# Patient Record
Sex: Male | Born: 1935 | Race: White | Hispanic: No | Smoking: Former smoker
Health system: Southern US, Community
[De-identification: ages and names within clinical notes are randomized; demographics above are authoritative.]

## PROBLEM LIST (undated history)

## (undated) DIAGNOSIS — I2510 Atherosclerotic heart disease of native coronary artery without angina pectoris: Secondary | ICD-10-CM

## (undated) DIAGNOSIS — I499 Cardiac arrhythmia, unspecified: Secondary | ICD-10-CM

## (undated) DIAGNOSIS — I480 Paroxysmal atrial fibrillation: Secondary | ICD-10-CM

## (undated) DIAGNOSIS — A0472 Enterocolitis due to Clostridium difficile, not specified as recurrent: Secondary | ICD-10-CM

## (undated) DIAGNOSIS — I219 Acute myocardial infarction, unspecified: Secondary | ICD-10-CM

## (undated) DIAGNOSIS — J189 Pneumonia, unspecified organism: Secondary | ICD-10-CM

## (undated) DIAGNOSIS — I509 Heart failure, unspecified: Secondary | ICD-10-CM

## (undated) DIAGNOSIS — Z66 Do not resuscitate: Secondary | ICD-10-CM

## (undated) DIAGNOSIS — N189 Chronic kidney disease, unspecified: Secondary | ICD-10-CM

## (undated) DIAGNOSIS — R0602 Shortness of breath: Secondary | ICD-10-CM

## (undated) DIAGNOSIS — I10 Essential (primary) hypertension: Secondary | ICD-10-CM

## (undated) DIAGNOSIS — E785 Hyperlipidemia, unspecified: Secondary | ICD-10-CM

## (undated) HISTORY — PX: CORONARY ARTERY BYPASS GRAFT: SHX141

## (undated) HISTORY — DX: Paroxysmal atrial fibrillation: I48.0

## (undated) HISTORY — PX: CHOLECYSTECTOMY: SHX55

## (undated) HISTORY — DX: Pneumonia, unspecified organism: J18.9

## (undated) HISTORY — PX: OTHER SURGICAL HISTORY: SHX169

---

## 1999-01-09 ENCOUNTER — Emergency Department (HOSPITAL_COMMUNITY): Admission: EM | Admit: 1999-01-09 | Discharge: 1999-01-09 | Payer: Self-pay | Admitting: Emergency Medicine

## 1999-01-09 ENCOUNTER — Encounter: Payer: Self-pay | Admitting: Emergency Medicine

## 1999-02-22 ENCOUNTER — Ambulatory Visit (HOSPITAL_COMMUNITY): Admission: RE | Admit: 1999-02-22 | Discharge: 1999-02-22 | Payer: Self-pay | Admitting: *Deleted

## 1999-08-15 ENCOUNTER — Encounter: Payer: Self-pay | Admitting: Cardiology

## 1999-08-15 ENCOUNTER — Encounter: Payer: Self-pay | Admitting: Emergency Medicine

## 1999-08-15 ENCOUNTER — Inpatient Hospital Stay (HOSPITAL_COMMUNITY): Admission: EM | Admit: 1999-08-15 | Discharge: 1999-08-19 | Payer: Self-pay | Admitting: Emergency Medicine

## 1999-08-17 ENCOUNTER — Encounter: Payer: Self-pay | Admitting: Cardiology

## 2001-10-10 ENCOUNTER — Ambulatory Visit (HOSPITAL_COMMUNITY): Admission: RE | Admit: 2001-10-10 | Discharge: 2001-10-11 | Payer: Self-pay | Admitting: Cardiology

## 2001-10-10 HISTORY — PX: CARDIAC CATHETERIZATION: SHX172

## 2003-05-21 ENCOUNTER — Encounter (INDEPENDENT_AMBULATORY_CARE_PROVIDER_SITE_OTHER): Payer: Self-pay | Admitting: Specialist

## 2003-05-21 ENCOUNTER — Observation Stay (HOSPITAL_COMMUNITY): Admission: RE | Admit: 2003-05-21 | Discharge: 2003-05-22 | Payer: Self-pay | Admitting: Urology

## 2005-04-19 ENCOUNTER — Ambulatory Visit (HOSPITAL_COMMUNITY): Admission: RE | Admit: 2005-04-19 | Discharge: 2005-04-20 | Payer: Self-pay | Admitting: Urology

## 2005-04-19 ENCOUNTER — Encounter (INDEPENDENT_AMBULATORY_CARE_PROVIDER_SITE_OTHER): Payer: Self-pay | Admitting: Specialist

## 2005-04-22 ENCOUNTER — Observation Stay (HOSPITAL_COMMUNITY): Admission: EM | Admit: 2005-04-22 | Discharge: 2005-04-28 | Payer: Self-pay | Admitting: Emergency Medicine

## 2006-02-19 ENCOUNTER — Inpatient Hospital Stay (HOSPITAL_COMMUNITY): Admission: RE | Admit: 2006-02-19 | Discharge: 2006-02-23 | Payer: Self-pay | Admitting: Urology

## 2006-02-19 ENCOUNTER — Encounter (INDEPENDENT_AMBULATORY_CARE_PROVIDER_SITE_OTHER): Payer: Self-pay | Admitting: Specialist

## 2006-02-19 ENCOUNTER — Encounter (INDEPENDENT_AMBULATORY_CARE_PROVIDER_SITE_OTHER): Payer: Self-pay | Admitting: *Deleted

## 2006-05-09 ENCOUNTER — Ambulatory Visit (HOSPITAL_COMMUNITY): Admission: RE | Admit: 2006-05-09 | Discharge: 2006-05-10 | Payer: Self-pay | Admitting: Urology

## 2006-05-09 ENCOUNTER — Encounter (INDEPENDENT_AMBULATORY_CARE_PROVIDER_SITE_OTHER): Payer: Self-pay | Admitting: Specialist

## 2007-06-26 ENCOUNTER — Inpatient Hospital Stay (HOSPITAL_COMMUNITY): Admission: EM | Admit: 2007-06-26 | Discharge: 2007-06-29 | Payer: Self-pay | Admitting: Emergency Medicine

## 2008-10-31 ENCOUNTER — Inpatient Hospital Stay (HOSPITAL_COMMUNITY): Admission: EM | Admit: 2008-10-31 | Discharge: 2008-11-01 | Payer: Self-pay | Admitting: Emergency Medicine

## 2010-09-20 LAB — POCT CARDIAC MARKERS
CKMB, poc: <1 ng/mL — ABNORMAL LOW (ref 1.0–8.0)
CKMB, poc: <1 ng/mL — ABNORMAL LOW (ref 1.0–8.0)
Myoglobin, poc: 146 ng/mL (ref 12–200)
Myoglobin, poc: 83.8 ng/mL (ref 12–200)
Troponin i, poc: <0.05 ng/mL (ref 0.00–0.09)
Troponin i, poc: <0.05 ng/mL (ref 0.00–0.09)

## 2010-09-20 LAB — URINALYSIS, ROUTINE W REFLEX MICROSCOPIC
Bilirubin Urine: NEGATIVE
Glucose, UA: NEGATIVE mg/dL
Ketones, ur: NEGATIVE mg/dL
Nitrite: NEGATIVE
Protein, ur: 100 mg/dL — AB
Specific Gravity, Urine: 1.014 (ref 1.005–1.030)
Urobilinogen, UA: 0.2 mg/dL (ref 0.0–1.0)
pH: 6 (ref 5.0–8.0)

## 2010-09-20 LAB — BASIC METABOLIC PANEL
CO2: 27 mEq/L (ref 19–32)
Calcium: 9.1 mg/dL (ref 8.4–10.5)
Creatinine, Ser: 1.3 mg/dL (ref 0.4–1.5)
GFR calc Af Amer: >60 mL/min (ref >60)

## 2010-09-20 LAB — URINE MICROSCOPIC-ADD ON

## 2010-09-20 LAB — DIFFERENTIAL
Basophils Absolute: 0 10*3/uL (ref 0.0–0.1)
Basophils Relative: 0 % (ref 0–1)
Basophils Relative: 0 % (ref 0–1)
Eosinophils Absolute: 0 10*3/uL (ref 0.0–0.7)
Eosinophils Absolute: 0 10*3/uL (ref 0.0–0.7)
Eosinophils Relative: 0 % (ref 0–5)
Eosinophils Relative: 0 % (ref 0–5)
Lymphocytes Relative: 5 % — ABNORMAL LOW (ref 12–46)
Lymphs Abs: 0.7 10*3/uL (ref 0.7–4.0)
Monocytes Absolute: 0.7 10*3/uL (ref 0.1–1.0)
Monocytes Absolute: 1.9 10*3/uL — ABNORMAL HIGH (ref 0.1–1.0)
Monocytes Relative: 13 % — ABNORMAL HIGH (ref 3–12)
Monocytes Relative: 6 % (ref 3–12)
Neutro Abs: 10.7 10*3/uL — ABNORMAL HIGH (ref 1.7–7.7)
Neutrophils Relative %: 88 % — ABNORMAL HIGH (ref 43–77)

## 2010-09-20 LAB — CULTURE, BLOOD (ROUTINE X 2)
Culture: NO GROWTH
Culture: NO GROWTH

## 2010-09-20 LAB — GLUCOSE, CAPILLARY
Glucose-Capillary: 116 mg/dL — ABNORMAL HIGH (ref 70–99)
Glucose-Capillary: 197 mg/dL — ABNORMAL HIGH (ref 70–99)
Glucose-Capillary: 201 mg/dL — ABNORMAL HIGH (ref 70–99)
Glucose-Capillary: 246 mg/dL — ABNORMAL HIGH (ref 70–99)

## 2010-09-20 LAB — APTT: aPTT: 46 seconds — ABNORMAL HIGH (ref 24–37)

## 2010-09-20 LAB — POCT I-STAT, CHEM 8
BUN: 21 mg/dL (ref 6–23)
Calcium, Ion: 1.2 mmol/L (ref 1.12–1.32)
Chloride: 106 mEq/L (ref 96–112)
Creatinine, Ser: 1.1 mg/dL (ref 0.4–1.5)
Glucose, Bld: 190 mg/dL — ABNORMAL HIGH (ref 70–99)
HCT: 41 % (ref 39.0–52.0)
Hemoglobin: 13.9 g/dL (ref 13.0–17.0)
Potassium: 4.5 mEq/L (ref 3.5–5.1)
Sodium: 139 mEq/L (ref 135–145)
TCO2: 24 mmol/L (ref 0–100)

## 2010-09-20 LAB — CK TOTAL AND CKMB (NOT AT ARMC)
CK, MB: 0.7 ng/mL (ref 0.3–4.0)
Relative Index: INVALID (ref 0.0–2.5)
Total CK: 30 U/L (ref 7–232)

## 2010-09-20 LAB — CBC
HCT: 38.8 % — ABNORMAL LOW (ref 39.0–52.0)
Hemoglobin: 12.1 g/dL — ABNORMAL LOW (ref 13.0–17.0)
Hemoglobin: 13.5 g/dL (ref 13.0–17.0)
MCHC: 34.9 g/dL (ref 30.0–36.0)
MCHC: 35.3 g/dL (ref 30.0–36.0)
MCV: 96.7 fL (ref 78.0–100.0)
MCV: 96.8 fL (ref 78.0–100.0)
Platelets: 101 10*3/uL — ABNORMAL LOW (ref 150–400)
RBC: 3.55 MIL/uL — ABNORMAL LOW (ref 4.22–5.81)
RBC: 4.01 MIL/uL — ABNORMAL LOW (ref 4.22–5.81)
RDW: 13.4 % (ref 11.5–15.5)
WBC: 12.2 10*3/uL — ABNORMAL HIGH (ref 4.0–10.5)

## 2010-09-20 LAB — URINE CULTURE: Colony Count: >=100000

## 2010-09-20 LAB — BRAIN NATRIURETIC PEPTIDE: Pro B Natriuretic peptide (BNP): 194 pg/mL — ABNORMAL HIGH (ref 0.0–100.0)

## 2010-09-20 LAB — CARDIAC PANEL(CRET KIN+CKTOT+MB+TROPI)
Relative Index: INVALID (ref 0.0–2.5)
Relative Index: INVALID (ref 0.0–2.5)
Troponin I: 0.02 ng/mL (ref 0.00–0.06)

## 2010-09-20 LAB — PROTIME-INR
INR: 2.3 — ABNORMAL HIGH (ref 0.00–1.49)
Prothrombin Time: 27.1 seconds — ABNORMAL HIGH (ref 11.6–15.2)

## 2010-09-20 LAB — TROPONIN I: Troponin I: 0.03 ng/mL (ref 0.00–0.06)

## 2010-10-25 NOTE — H&P (Signed)
NAMEILLIAS, PANTANO NO.:  [id number]   MEDICAL RECORD NO.:  [id number]          PATIENT TYPE:  INP   LOCATION:  1833                         FACILITY:  [facility]   PHYSICIAN:  Della Goo, M.D. DATE OF BIRTH:  1910-12-11   DATE OF ADMISSION:  10/31/2008  DATE OF DISCHARGE:                              HISTORY & PHYSICAL   PRIMARY CARE PHYSICIAN:  Vernie Ammons, M.D.   CHIEF COMPLAINT:  Fevers, chills.   HISTORY OF PRESENT ILLNESS:  This is a 77 year old male who was brought  to the emergency department secondary to sudden onset of fevers, chills  today along with shortness of breath.  The patient was found on arrival  have a decreased O2 saturation of 80%.  The patient also has a  remarkable history that this past week he underwent repeat cystoscopy  performed secondary to finding of bladder tumors.  This was performed by  Dr. Laverle Patter, Urology.  The patient was evaluated in the emergency  department found to have CHF on chest x-ray; also found to have a  positive urinalysis and placed on IV antibiotic therapy year of Rocephin  and also administered Lasix IV times 1 dose.  The patient was referred  for admission.   The patient denies having any nausea, vomiting.  He does report having  decreased p.o. intake during the past 24 hours.  He denies having any  syncope, seizures.   PAST MEDICAL HISTORY:  1. Coronary artery disease, status post coronary artery bypass      grafting times in 1983 and then again in 1993.  2. Hyperlipidemia.  3. Hypertension.  4. He also status post cholecystectomy.  5. History of type 2 diabetes mellitus.   MEDICATIONS:  1. Glyburide 2.5 mg p.o. b.i.d.  2. Gemfibrozil 600 mg p.o. b.i.d.  3. Folic acid for 400 mcg one p.o. daily.  4. Coumadin 5 mg one p.o. daily.  5. Bisoprolol 5 mg p.o. b.i.d.  6. Zetia 10 mg one p.o. daily.  7. Lanoxin 0.25 mg one p.o. daily.   ALLERGIES:  MORPHINE which causes hallucinations.   SOCIAL  HISTORY:  Patient is a nonsmoker, nondrinker.   FAMILY HISTORY:  Noncontributory secondary to the patient's age.   REVIEW OF SYSTEMS:  Pertinents are mentioned above.  This patient denies  having any nausea, vomiting, diarrhea or constipation.   PHYSICAL EXAMINATION:  FINDINGS:  This is a pleasant 77 year old  elderly, well-nourished, well-developed gentleman in mild discomfort but  no acute distress.  VITAL SIGNS: Temperature 99.8, blood pressure 132/50, heart rate 72,  respirations 18, O2 saturations 97%-199%.  HEENT EXAMINATION:  Normocephalic, atraumatic.  Pupils equally round,  reactive to light.  Extraocular movements are intact.  Funduscopic  benign.  Nares are patent bilaterally.  Oropharynx is clear.  NECK:  Supple, full range of motion.  No thyromegaly, adenopathy,  jugular venous distention.  CARDIOVASCULAR:  Regular rate and rhythm.  LUNGS:  Clear to auscultation bilaterally.  ABDOMEN: Positive bowel sounds, soft, nontender, nondistended.  EXTREMITIES: Without cyanosis, clubbing or edema.  NEUROLOGIC EXAMINATION:  The patient is alert and oriented x3.  Motor  and sensory function are intact.  There are no gross motor or sensory  deficits.   LABORATORY STUDIES:  White blood cell count 12.2, hemoglobin 13.5,  hematocrit 38.8, MCV 96.8, platelets 101, neutrophils 88%, lymphocytes  5%.  Pro time 27.1, INR 2.3, PTT 46.  Sodium 139, potassium 4.5,  chloride 106, BUN 21, creatinine 1.1, glucose 190 and CO2 24.  Point of  care cardiac markers with a myoglobin of 83.8, CK-MB less than 1.0,  troponin less than 0.05.  Urinalysis reveals moderate leukocyte  esterase, total protein 100 mcg/dL and large urine hemoglobin.  Beta  natriuretic peptide 194.0.  Chest x-ray reveals cardiomegaly with early  CHF.   ASSESSMENT:  A 77 year old male being admitted with:  1. Febrile illness.  2. Urinary tract infection.  3. Acute left-sided congestive heart failure.  4. Shortness of breath.   5. Type 2 diabetes mellitus.  6. History of coronary artery disease.   PLAN:  The patient will be admitted to telemetry area.  Cardiac enzymes  will be performed.  The patient will be placed on IV antibiotic therapy  of Rocephin and supplemental oxygen has also been ordered.  The  patient's regular medications will be further verified and continued.  His PT and INR will also be monitored daily.  The patient will be placed  on GI prophylaxis as well.  Further workup will ensue pending results of  the patient's clinical course.      Della Goo, M.D.  Electronically Signed     HJ/MEDQ  D:  10/31/2008  T:  11/01/2008  Job:  [id number]   cc:   Vernie Ammons

## 2010-10-25 NOTE — Discharge Summary (Signed)
[name], David Davidson              ACCOUNT NO.:  [id number]   MEDICAL RECORD NO.:  [id number]          PATIENT TYPE:  INP   LOCATION:  4729                         FACILITY:  [facility]   PHYSICIAN:  Charlestine Massed, MDDATE OF BIRTH:  06-29-1910   DATE OF ADMISSION:  10/31/2008  DATE OF DISCHARGE:  11/01/2008                               DISCHARGE SUMMARY   PRIMARY CARE PHYSICIAN:  Lorelle Formosa, M.D.   REASON FOR ADMISSION:  Fever and chills.   DISCHARGE DIAGNOSES:  1. Urinary tract infection.  2. Pyelonephritis has been ruled out and no evidence of      hydronephrosis.  3. Coronary artery disease, currently stable.  4. Dyslipidemia, currently stable.  5. Hypertension, currently stable.  6. History of diabetes mellitus type 2, currently sugars are stable.   DISCHARGE MEDICATIONS:  1. Ceftin 250 mg p.o. b.i.d. for 10 days.  2. Glyburide 2.5 mg p.o. b.i.d.  3. Gemfibrozil 600 mg p.o. b.i.d.  4. Folic acid 400 mcg p.o. daily.  5. Coumadin 5 mg p.o. daily.  6. Bisoprolol 5 mg p.o. b.i.d.  7. Zetia 10 mg p.o. daily.  8. Lanoxin 0.25 mg p.o. daily.   HOSPITAL COURSE BY PROBLEM:  1. Urinary tract infection with fever, chills, and sepsis.  The      patient was admitted yesterday on Oct 31, 2008, with fever, chills      and some dysuria.  The patient said that he went to see Dr.      Ronne Binning, but he was not in his office and so he came to the      emergency room as he started getting fever.  The patient was      evaluated in the ER.  His urine was found to have a significant      pyuria and bacteriuria.  Urine culture was done which is still      pending.  The patient had a temperature of 101.  He was started on      ceftriaxone after which the fever subsided considerably.  He also      clinically improved a lot.  The urine culture is still pending but      in view of the fact that the patient has clinically improved, we      will discharge him on his home medications.  If  there is any      further change in this, we will request Dr. Ronne Binning to follow      these results of culture if there is any further culture changes      present.  So far the culture reports have not come back as it is      only less than 24 hours since admission, so we will follow up these      results and inform Dr. Ronne Binning about any further changes at that      time.  2. Coronary artery disease, currently stable.  Continue the existing      medications.  3. Dyslipidemia, currently on gemfibrozil and Zetia 10 mg daily.  4. Diabetes mellitus.  Continue glyburide.  Sugar was stable at this      time.   TESTS DONE DURING THIS ADMISSION:  Ultrasound of the kidneys, bladder,  and pelvis, did not reveal any evidence of hydronephrosis in both  kidneys.  Simple cysts found on the left kidney, bladder does not have  any mass and his prostate gland is slightly enlarged.  No other acute  abnormalities noted.   DISPOSITION:  Discharge back home.   FOLLOW UP:  Follow up with Dr. Ronne Binning in another 3-4 days for blood  check up.   DISCHARGE INSTRUCTIONS:  The patient has been clearly instructed that if  he develops fever again, then to report to the emergency room for  further check up.    A total of 40 minutes spent on the discharge.      Charlestine Massed, MD  Electronically Signed     UT/MEDQ  D:  11/01/2008  T:  11/02/2008  Job:  [id number]   cc:   Lorelle Formosa, M.D.

## 2010-10-25 NOTE — H&P (Signed)
NAMESKIP, LITKE NO.:  [id number]   MEDICAL RECORD NO.:  [id number]          PATIENT TYPE:  INP   LOCATION:  1829                         FACILITY:  [facility]   PHYSICIAN:  Lonia Blood, M.D.DATE OF BIRTH:  [birth year]   DATE OF ADMISSION:  06/26/2007  DATE OF DISCHARGE:                              HISTORY & PHYSICAL   PRIMARY CARE PHYSICIAN:  Dr. Jaclyn Prime. [place].   CHIEF COMPLAINT:  Shortness of breath.   HISTORY OF PRESENT ILLNESS:  [name] is a very pleasant,  independently-functioning 77 year old gentleman who reports a one-week  history of progressive shortness of breath.  This is most appreciable  when he exerts himself physically.  He has noted increasing lower  extremity edema over this time period as well.  He also endorses some  orthopnea, reporting that he is now sleeping on two to three pillows.  There has been no chest pain.  There have been no fevers or chills.  There has been no cough.  There has been no nausea, vomiting or  abdominal pain.  His p.o. intake has been regular.  There have not been  any recent changes in his medical regimen.   REVIEW OF SYSTEMS:  A comprehensive review of systems is unremarkable,  with the exception of the positive elements noted in the history of  present illness.   PAST MEDICAL HISTORY:  1. Bladder cancer, status post transurethral resection.  2. Atrial fibrillation.  3. Coronary artery disease, status post coronary artery bypass graft      surgery x2 in 1981.  4. Systolic congestive heart failure with reported recent      echocardiogram within the last two weeks at Dr. Pollie Friar office.  5. Hypertension.  6. Hyperlipidemia.  7. Diabetes mellitus.  8. Macular degeneration.  9. No-code-blue/DNR.   OUTPATIENT MEDICATIONS:  1. Bisoprolol 5 mg daily.  2. Coumadin 5 mg daily.  3. Finasteride daily.  4. Flomax 0.4 mg daily.  5. Folic acid 1 mg daily.  6. Gemfibrozil 600 mg daily.  7.  Glyburide 2.5 mg daily.  8. Digoxin 0.125 mg p.o. daily.  9. Zetia 10 mg daily.   ALLERGIES:  MORPHINE.   FAMILY HISTORY:  Noncontributory secondary to age.   SOCIAL HISTORY:  The patient lives alone.  His wife has passed away.  He  is retired.  He does not drink alcohol or smoke.   DATA:  Beta natriuretic peptide 301.  Digoxin less than 0.2.  Point of  care cardiac markers negative x1.   A 12-lead electrocardiogram reveals normal sinus rhythm at 73 beats per  minute without acute ST or T-wave changes.   CBC reveals a hemoglobin 11.6, MCV 98, white count and platelet count  normal.  Basic metabolic panel reveals a potassium 5.2 and a recheck  potassium of 4.6.  Renal function is otherwise normal.  Serum glucose  174.   A chest x-ray reveals significant bilateral pulmonary edema.   PHYSICAL EXAMINATION:  VITAL SIGNS:  Temperature 98.4 degrees, blood  pressure 154/72, heart rate 72, respirations 20, O2 saturation 95% on 3  liters per minute nasal cannula.  GENERAL:  A well-developed and well-nourished gentleman, in no acute  respiratory distress.  HEENT:  Normocephalic and atraumatic.  Pupils equal, round, react to  light and accommodation.  Extraocular muscles intact.  EOCs and  oropharynx clear.  NECK:  Jugular venous distention to the angle the jaw at 30 degrees.  LUNGS:  Bibasilar crackles halfway up both [place] with clear movement  throughout all other [place], with no active wheeze.  CARDIOVASCULAR:  Regular rate and rhythm at the present time without  murmur, gallop or rub.  ABDOMEN:  Nontender, nondistended, soft, bowel sounds present.  No  hepatosplenomegaly.  No rebound, no ascites.  EXTREMITIES:  With 2+ doughy bilateral lower extremity edema to halfway  up the thighs with no significant erythema or discharge.  NEUROLOGIC:  Alert and oriented x4.  Cranial nerves II-XII  intact  bilaterally, with 5/5 strength in the bilateral upper and lower  extremities.  Intact  sensation to touch throughout.   IMPRESSION/PLAN:  1. Acute congestive heart failure exacerbation:  The patient is      clearly suffering with an acute congestive heart failure      exacerbation with volume overload.  We will diurese him      empirically.  He reportedly had an echocardiogram within the last      two weeks at Dr. Pollie Friar office.  Prior to ordering a new      echocardiogram, we will request the results of this be sent to the      patient's chart.  We will increase his digoxin, as his level is      noted to be subtherapeutic, and he reports that he is taking his      medications as scheduled.  We will rule the patient out for an      acute myocardial infarction empirically, but the patient does not      present with symptoms to suggest this, and therefore I do not feel      that a nitroglycerin drip or other medications appropriate for      acute coronary syndrome are indicated at this time.  2. Atrial fibrillation:  The patient has a longstanding history of      atrial fibrillation.  At the present time, however, he is in normal      sinus rhythm.  This makes it much less likely that the patient's      volume overload is due to uncontrolled ventricular response rate.      We will follow him on telemetry.  We will ask the pharmacy to      continue his Coumadin dosing.  3. Hypertension:  The patient's blood pressure is suboptimally      controlled at the present time.  There is likely a component of      acute stress with the patient's pending hospitalization.  I will      continue his at-home p.o. medications and we will follow him during      his hospital stay.  4. Diabetes mellitus:  We will place the patient on sliding scale      insulin.  We will administered his home Glucotrol dose.  We will      follow his blood sugars very closely.  5. No-code-blue:  I had a discussion with the patient directly.  He      reports that he does not wish to undergo mechanical  ventilation,      intubation or  CPR.  We will therefore honor his wishes and declare      him a no-code-blue during his hospital stay.      Lonia Blood, M.D.  Electronically Signed     JTM/MEDQ  D:  06/26/2007  T:  06/26/2007  Job:  [id number]   cc:   Jaclyn Prime. Lucas Mallow, M.D.

## 2010-10-25 NOTE — Discharge Summary (Signed)
David Davidson, David Davidson              ACCOUNT NO.:  [id number]   MEDICAL RECORD NO.:  [id number]          PATIENT TYPE:  INP   LOCATION:  4715                         FACILITY:  [facility]   PHYSICIAN:  Wilson Singer, M.D.DATE OF BIRTH:  1911/04/03   DATE OF ADMISSION:  06/26/2007  DATE OF DISCHARGE:  06/29/2007                               DISCHARGE SUMMARY   FINAL DISCHARGE DIAGNOSES:  1. Congestive heart failure with frank pulmonary edema.  2. Atrial fibrillation.  3. Hypertension.  4. Diabetes type 2.  5. Hyperlipidemia.   CONDITION ON DISCHARGE:  Stable.   MEDICATIONS ON DISCHARGE:  1. Bisoprolol 5 mg daily.  2. Coumadin 5 mg daily.  3. Finasteride daily.  4. Flomax 0.4 mg daily.  5. Folic acid 1 mg daily.  6. Gemfibrozil 600 mg daily.  7. Glyburide increased to 2.5 mg b.i.d.  8. Digoxin 0.125 mg daily.  9. Zetia 10 mg daily.  10.Lasix 20 mg b.i.d.   HISTORY:  This is a very pleasant independently functioning 77 year old  man who came in with pulmonary edema.  Please see initial history and  physical examination by Dr. Jetty Duhamel.   HOSPITAL COURSE:  The patient was admitted, and he was diuresed  initially with intravenous Lasix.  This improved his pulmonary edema  very rapidly and a chest x-ray improved correspondingly.  His sugars  were elevated as he was on the Lasix, and his glyburide has had to be  increased to twice a day.  He remained well anticoagulated for his  atrial fibrillation.  He did have some bradycardia which was managed by  withholding his digoxin, and now it has been restarted prior to  discharge.  An echocardiogram report was obtained from Dr. Pollie Friar  office which showed an ejection fraction equal to or greater than 50%  with mild aortic stenosis and decreased left ventricular contractility.  I suspect he has diastolic dysfunction mostly that is causing his  pulmonary edema.  Serial cardiac enzymes ruled out any ischemia or  myocardial  infarction.   DISPOSITION:  Today, he looks well and feels well.  He is not short of  breath.   On physical examination, temperature 90.7, blood pressure 113/62, pulse  90 in atrial fibrillation, saturation 94% on 2 liters oxygen.  Heart sounds are present in atrial fibrillation  Lung Gleed are entirely clear.   Investigations today show BNP of 83.  Digoxin level 0.5 which is  reduced.  Sodium 139, potassium 3.8, bicarbonate 24, BUN 35, creatinine  0.94, INR 2.8.   Further disposition.  We reinstituted his digoxin today, and I believe  his ventricular rate should come down correspondingly.  His Lasix will  be maintained at 20 mg twice a day until further evaluated by Dr. Lucas Mallow.  I do not think any further cardiologic intervention is needed at this  point, except to treat him medically.  His oral hypoglycemic agent has  been increased to twice a day which I am not surprised with Lasix on  board.      Wilson Singer, M.D.  Electronically Signed  NCG/MEDQ  D:  06/29/2007  T:  06/29/2007  Job:  [id number]

## 2010-10-28 NOTE — Op Note (Signed)
David Davidson, David Davidson NO.:  [id number]   MEDICAL RECORD NO.:  [id number]          PATIENT TYPE:  OIB   LOCATION:  1431                         FACILITY:  [facility]   PHYSICIAN:  Heloise Purpura, MD      DATE OF BIRTH:  22-Nov-1910   DATE OF PROCEDURE:  05/09/2006  DATE OF DISCHARGE:                               OPERATIVE REPORT   PREOPERATIVE DIAGNOSIS:  Bladder cancer.   POSTOPERATIVE DIAGNOSIS:  Bladder cancer.   PROCEDURES:  1. Cystoscopy.  2. Examination under anesthesia.  3. Transurethral resection of bladder tumor (between 2 and 5 cm).   SURGEON:  Dr. Heloise Purpura.   ANESTHESIA:  General.   COMPLICATIONS:  None.   INDICATION:  Mr. Wingrove is a 77 year old gentleman with a history of  superficial bladder cancer.  He has a history of a high-grade TA bladder  cancer.  Most recently, he underwent a resection of the extremely large  bladder tumor at the dome of his bladder including the resection of the  smaller tumors on both the left and right side of the bladder.  He is  now presenting for a restaging evaluation for resection of any residual  disease.  Potential risks and benefits were discussed with the patient  and he consented.   DESCRIPTION OF PROCEDURE:  The patient was taken to the operating room  and general anesthetic was administered.  He was given preoperative  antibiotics, placed in the dorsal lithotomy position, prepped and draped  in the usual sterile fashion.  Next preoperative time-out was performed.  Cystourethroscopy was then performed with the 30 and 70 degrees lens.  The bladder was emptied and an exam under anesthesia was performed and  was negative.  The cytoscope was reinserted and a complete survey of the  bladder revealed some residual tumor at the dome of the bladder at the  site of the patient's previous large tumor site.  In addition, multiple  small tumors were noted anteriorly, laterally on the right and left side  of the  bladder.  An examination under anesthesia was performed with the  patient's bladder emptied.  There is no evidence of any three-  dimensional mass.  The cystoscope was then removed and replaced with the  26-French resectoscope sheath.  Using loop resection, all visible  bladder tumors were removed.  Specimens were taken specifically from the  bladder dome, base of the bladder dome lesion, and the right trigone.  Additional abnormal areas were fulgurated.  Hemostasis was obtained at  all sites with fulguration.  The patient's bladder was then emptied and  reinspected.  Hemostasis appeared excellent.  At this point the  resectoscope was removed.  A 22-French Foley catheter was inserted into  the bladder and the bladder was drained.  40 mg of  mitomycin C in 20 mL of sterile water was then inserted into the bladder  and the catheter was plugged for 1 hour.  He was able to be awakened and  transferred to recovery unit in satisfactory condition.  There were no  complications.  The patient appeared to tolerate the procedure  well.           ______________________________  Heloise Purpura, MD  Electronically Signed     LB/MEDQ  D:  05/09/2006  T:  05/09/2006  Job:  [id number]

## 2010-10-28 NOTE — Op Note (Signed)
NAME:  David Davidson, David Davidson                        ACCOUNT NO.:  [id number]   MEDICAL RECORD NO.:  [id number]                   PATIENT TYPE:  OBV   LOCATION:  0371                                 FACILITY:  [facility]   PHYSICIAN:  Claudette Laws, M.D.               DATE OF BIRTH:  1911-05-10   DATE OF PROCEDURE:  05/21/2003  DATE OF DISCHARGE:                                 OPERATIVE REPORT   PREOPERATIVE DIAGNOSIS:  Bladder tumor.   POSTOPERATIVE DIAGNOSIS:  Bladder tumor.   OPERATION/PROCEDURE:  1. Cystoscopy.  2. Transurethral resection of bladder tumor, large, with total surface area     of approximately 8 cm.  3. Examination under anesthesia.  4. Mitomycin-C instillation.   SURGEON:  Claudette Laws, M.D.   ASSISTANT:  Susanne Borders, M.D.   ANESTHESIA:  Laryngeal mask airway.   PATHOLOGY:  Bladder tumors for pathology analysis.   ESTIMATED BLOOD LOSS:  Approximately 150 ml.   COMPLICATIONS:  None.   DISPOSITION:  To the post anesthesia care unit in stable condition.   DRAINS:  24-French, three-way Foley catheter which is currently plugged due  to mitomycin-C instillation but will be connected to continuous bladder  irrigation in one hour.   INDICATIONS FOR PROCEDURE:  [name] is a 77 year old male who was  recently evaluated by Dr. Etta Grandchild for gross hematuria.  Flexible cystoscopy in  the office demonstrated a large bladder tumor on the right lateral wall.  The patient was also evaluated with CT scan with and without contrast which  demonstrated normal upper tracts and no obvious invasion of the bladder  tumor through the wall of the bladder.  It was recommended that the patient  undergo transurethral resection of bladder tumor.  He consented to this  after understanding the risks, benefits and alternatives.   DESCRIPTION OF PROCEDURE:  The patient was brought to the operating room and  correctly identified by his identification bracelet.  He was given  preoperative  antibiotics and general endotracheal anesthesia.  He was placed  in the dorsal lithotomy position.  He was prepped and draped in the typical  sterile fashion.   Cystoscopy was carried out with 22-French cystoscopy and 12-degree lens.  The patient had a minimal lateral lobe hypertrophy but a nice open bladder  neck within his prostatic urethra.  Bilateral ureteral orifices were  identified in a normal anatomic location, each effluxing clear urine.  On  the right lateral wall of the bladder extending from the approximately  halfway up the right lateral wall to the level of the right ureteral orifice  was a large bladder tumor which appeared somewhat sessile.  Systematic look  at the remainder of the bladder revealed no further mucosal abnormalities.  There were several jag stones on the floor of the patient's bladder.  There  were also a few blood clots.  The cystoscope was removed.  A 28-French  resectoscope  was replaced with the obturator.  The obturator was removed and  the resectoscope was inserted.  Using cutting current, the large right  bladder tumor was serially resected, starting laterally and extending  medially.  There were some small papillary lesions near the right ureteral  orifice but this area was not resected.  These lesions were carefully  coagulated near the right ureteral orifice.  Just lateral to the ureteral  orifice, cutting current was used and a thick muscular bite was resected.  After the entire tumor was resected, the floor of the tumor was carefully  coagulated using the coagulation current of the resectoscope.  There did not  appear to be any further bladder lesion.  The total surface area of the  resection was approximately 8 cm.  Because of the depth of the resection  near the right ureteral orifice, an ampule of indigo carmine was given and  there was indeed efflux of blue urine from both ureteral orifices.  A tube  syringe was used to irrigate out all the  bladder chips and re inspection  revealed no active bleeding.  A 24-French three-way catheter was placed and  40 mg mitomycin-C was injected into the patient's bladder and the two ports  were capped.  The dwell time will be approximately one hour at which time  the patient's Foley catheter will be connected to continuous bladder  irrigation.   Please note that Dr. Etta Grandchild was present and participated in all aspects of  this case as he was primary Careers adviser.     Susanne Borders, MD                           Claudette Laws, M.D.    DR/MEDQ  D:  05/21/2003  T:  05/21/2003  Job:  [id number]

## 2010-10-28 NOTE — Op Note (Signed)
NAMECHANDRA, FEGER NO.:  [id number]   MEDICAL RECORD NO.:  [id number]          PATIENT TYPE:  AMB   LOCATION:  DAY                          FACILITY:  [facility]   PHYSICIAN:  Claudette Laws, M.D.  DATE OF BIRTH:  [birth year]   DATE OF PROCEDURE:  04/19/2005  DATE OF DISCHARGE:                                 OPERATIVE REPORT   PREOPERATIVE DIAGNOSIS:  Two large papillary bladder tumors.   POSTOPERATIVE DIAGNOSIS:  Two large papillary bladder tumors.   OPERATIONS:  Cystoscopy and transurethral resection of two bladder tumors  and then the instillation of 40 mg of mitomycin C in 40 mL of sterile water  postop.   SURGEON:  Claudette Laws, M.D.   PROCEDURE:  The patient was prepped and draped in the dorsal lithotomy  position under intubated general anesthesia. He was given a B&O suppository  for anesthetic purposes. Cystoscopy was then performed with a 12 degree lens  and a 22-French cystoscope. He had kissing lateral lobes for about 2-3 cm.  He was noted to have large bulky tumor right over the right trigone area and  then there was another tumor occupying the dome of the bladder. The rest of  the bladder was trabeculated but looked normal. I was able to identify the  left ureteral orifice. The right ureteral orifice appeared to be somewhat  laterally displaced probably from a prior TUR bladder tumor about 2 years  ago.   I then dilated him up with Sissy Hoff sounds to a #30-French and put in a 28-  [place] continuous flow resectoscope sheath and then using the [facility] loop  and the camera, I resected out the tumor along the right trigonal area which  abutted up against the bladder neck area. I resected this down to the floor  of the bladder. This measured about 4 cm in diameter.   We then turned our attention to the dome of the bladder. This was a bulky  about a 5 cm tumor which proved to be rather difficult to resect but with a  combination of suprapubic  pressure using the combination of the right-angle  loop and bladder wall loop. We resected out the tumor as best I thought I  could. We then fulgurated the base.   Incidentally, a bimanual exam pre TUR revealed a flat benign prostate and no  obvious palpable pelvic tumor.   At the end of the procedure, the bladder irrigated well. There was clear  irrigation and I put in a 24-French three-way Foley catheter and then  instilled the 40 mg of mitomycin C. We plugged both ports and it was taken  back to the recovery room in satisfactory condition. The plan now is to keep  the mitomycin C in the recovery room for 1 hour and then irrigate out the  bladder.     Claudette Laws, M.D.  Electronically Signed    RFS/MEDQ  D:  04/19/2005  T:  04/19/2005  Job:  [id number]

## 2010-10-28 NOTE — Discharge Summary (Signed)
[place]  Patient:    David Davidson, David Davidson                     MRN: [id number] Adm. Date:  [id number] Disc. Date: [id number] Attending:  Clovis Cao Dictator:   Donzetta Matters, P.A. CC:         Candy Sledge, M.D., [facility]                           Discharge Summary  DATE OF BIRTH:  June 30, 1910  PRINCIPAL DISCHARGE DIAGNOSES: 1. Multifactorial organic gait disorder. 2. Presyncope. 3. Macular degeneration. 4. Atrial flutter. 5. Diabetes mellitus, non-insulin dependent.  PROCEDURES: 1. Carotid Dopplers. 2. MRI of the brain, as well as MRA. 3. CT of brain.  CONSULTATIONS: 1. Candy Sledge, M.D., neurology. 2. Physical therapy.  CONDITION ON DISCHARGE:  Stable and improved.  BRIEF HISTORY:  This is an 77 year old male that was admitted with three weeks f severe weakness, especially with attempting to stand.  On the day of admission, he was unable to stand without assistance.  He was initially seen in the emergency  room.  He states that he had had multiple episodes over the past two months. They have worsened over the past two weeks.  He states the room was not spinning but he described it more as a shade was pulled down over his eyes.  He has recently had an eye exam.  He does have history of macular degeneration.  There has been no essential change to the eye exam by his description.  He was then admitted for further evaluation.  Screening labs were obtained on admission.  He did have some short pauses with the atrial flutter on admission but were not correlated with is episodes of presyncope.  Physical therapy was then asked to see him secondary to the mild unsteadiness, as well as dizziness.  Strength appeared intact. Orthostatic blood pressures obtained.  His sitting blood pressure was 125/74, standing blood pressure 109/66.  He was then seen in consultation by Dr. Fransisca Connors, who noted with gait  that this was wide-based, somewhat lurching and unsteady, sways with a Romberg, and his initial thought was that this was an organic gait disorder with multifactorial processes.  He does have intermittent  lightheadedness with presyncope and a question of peripheral neuropathy with history of diabetes.  He does have macular degeneration, which also can contribute to his gait disorder.  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  He also underwent carotid Dopplers, which showed no ICA stenosis bilaterally.  Vertebral artery flow was antegrade bilaterally.  Mild plaque was noted as well.  He was continued on  with physical therapy for ambulation.  Dipyridamole was also discontinued on August 17, 1999 in hopes this would help with the unsteadiness.  Other labs were lso obtained, which were normal.  B12, sed rate, thyroid studies, chemistries, as well as CBC all were within normal range and he did agree with discontinuing the Persantine.  Also, due to history of diabetes, peripheral neuropathy could be contributing and he can be scheduled as an outpatient for nerve conduction studies. On August 18, 1999, he continued to work with physical therapy.  They felt that he was doing much better.  He  had some weakness.  Blood pressure was low during the evening but as he is up ambulating this morning blood pressure is stable at 125/63. Pulse rate still increases when he is ambulating, therefore we do feel he needs to continue on the present beta blocker to manage the heart rate, along with his digoxin.  He is anxious to go home and was observed while he ambulated with physical therapy.  It appears that he is able to rise from the sitting to standing position without assistance.  He has a wide-based gait.  With close  observation by the physical therapist, he was also able to pivot without assistance.  She is continuing to suggest he use a walker and cane for unstableness and for these episodes of presyncope.  PHYSICAL EXAMINATION:  CHEST:  Clear.  HEART:  Atrial flutter.  ABDOMEN:  Soft.  EXTREMITIES:  No edema.  He does have longstanding ropey varicose veins that are nontender.  NEUROLOGIC:  Grossly nonfocal.  DISPOSITION:  Diet, exercise, and follow-up were discussed with the patient and it was felt that he was stable enough for discharge to home.  LABORATORY DATA:  CBC on admission showed white count at 7900, hemoglobin 15.5,  hematocrit 43.6, platelet count 135.  This was repeated again on August 16, 1999 nd remained stable.  Sed rate obtained was 24.  Pro time on admission was 22.6 with INR of 2.9 and PTT of 41.  This was rechecked again on August 16, 1999 with an INR of 3.0.  Sodium on admission was 133, potassium 4.6, chloride 99, CO2 29, glucose 05, BUN 18, creatinine 0.9.  Liver function tests within normal range.  Chemistries  repeated again on August 16, 1999 remained stable with sodium at 140, potassium 4.4, chloride 101, CO2 31, glucose 142, BUN 13, creatinine 0.9.  Thyroid studies obtained:  T4 free 0.81, thyroid-stimulating hormone 2.897 normal, free thyroxine was 2.3 which was normal.  Anemia studies:  Vitamin B12 454, which is in normal  range.  Digoxin level on admission 0.4, normal range to slightly low. Urinalysis does show pH at 7.0, negative for glucose, negative for ketones.  Did show small amount of blood with RBCs on high-power field at 6.  RADIOLOGY:  MRI of the brain with contrast does show generalized atrophy.  No acute abnormality.  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  CT of head without contrast does show diffuse atrophy.  Negative for intracranial hemorrhage or edema.  Post-contrast  showed o abnormal enhancing lesions.  Chest x-ray showed some left basilar patchy opacities with chronic changes.  Monitor strips do show atrial flutter, which is his normal.  The patient was ready for discharge to home on August 19, 1999.  NEW ADDITIONS TO MEDICATIONS: 1. Coated aspirin 81 mg daily. 2. Hold the dipyridamole. 3. Continue with Lasix 0.125 mg 1/2 tablet alternating with 1 tablet. 4. Lopressor 50 mg twice a day. 5. Amaryl 4 mg twice a day. 6. Coumadin 5 mg 1/2 tablet on Monday, 1 tablet other days. 7. Centrum Silver vitamin daily. 8. ______ 2 g daily.  ACTIVITY:  As tolerated.  To use walker or cane as he is acclimated to home.  DIET:  A no sugar diet.  FOLLOW-UP:  He is to follow up with Dr. Lucas Mallow in two weeks.  Follow up with Dr.  Noreene Filbert at [facility] in two to three weeks and schedule nerve conduction studies at that time. DD:  08/19/99 TD:  08/20/99 Job: [id number]

## 2010-10-28 NOTE — Discharge Summary (Signed)
NAMEWALFRED, BETTENDORF NO.:  [id number]   MEDICAL RECORD NO.:  [id number]          PATIENT TYPE:  OIB   LOCATION:  1431                         FACILITY:  [facility]   PHYSICIAN:  Claudette Laws, M.D.  DATE OF BIRTH:  Jan 10, 1911   DATE OF ADMISSION:  04/22/2005  DATE OF DISCHARGE:  04/28/2005                                 DISCHARGE SUMMARY   HISTORY:  This is a 77 year old man who underwent a TUR of a large bladder  tumor on April 19, 2005. He was sent home, only to come back through the  emergency room with some nausea, some bladder spasms, not feeling well. He  was brought in by his son. He was admitted because of the abdominal pain by  my partner, Dr. Alfredo Martinez, on April 22, 2005. His hemoglobin was  9.7, hematocrit 12.1. The patient had high-grade transitional cell  carcinoma. He also had a lesion in the dome of the bladder which was  incompletely resected. As far as we could tell there was no invasion in the  muscle. The patient became quite somnolent. It was difficult for him to  ambulate. His ultrasound showed no hydronephrosis. He did have some  bleeding. On hospital day #2 tentatively we had made plans to transfer him  to a skilled nursing home. His creatinine was 1.3, his white cell count was  8600. It then took several days including a consult to physical therapy and  also took several days to get over his confusion. We did remove the  catheter. He was incontinent of urine. We did resume his Lovenox. He did  hallucinate during most of the hospital stay. However, toward the end of the  stay he seemed to come around some and became rather lucid. He was getting  physical therapy right along. He was seen by his physician, Dr. Lucas Mallow. Then,  by April 28, 2005, his son felt that he was able to go back home rather  than a nursing home. He was sent on home. He does have Lovenox at home and  we plan to bridge him back to Coumadin.   FINAL DIAGNOSES:  1.  Postoperative hallucination, confusion, and abdominal pain following a      transurethral resection of a large bladder tumor on April 19, 2005.  2.  History of coronary artery disease status post bypass surgery x2.  3.  Atrial fibrillation.   OPERATIONS:  None.   MEDICATIONS ON DISCHARGE:  To include his Lovenox six more doses and then to  renew his Lanoxin 0.25 mg a day, his Zetia 10 mg a day, and also his  metformin/glyburide 2.5/500 twice a day.   CONDITION ON DISCHARGE:  Improved.   DISPOSITION:  He is to see me back in about 2 weeks now.      Claudette Laws, M.D.  Electronically Signed     RFS/MEDQ  D:  06/15/2005  T:  06/15/2005  Job:  [id number]

## 2010-10-28 NOTE — H&P (Signed)
[place]. [facility]  Patient:    David Davidson, David Davidson                     MRN: [id number] Adm. Date:  [id number] Attending:  Clovis Cao Dictator:   Donzetta Matters, P.A.                         History and Physical  CHIEF COMPLAINT:  Extreme weakness.  HISTORY OF PRESENT ILLNESS:  This is an 77 year old male who has given history f three weeks of severe weakness, especially with attempting to stand.  Today he s unable to stand without assistance.  He was initially seen in the emergency room. He was noted on history to have multiple episodes over the past two months, worse over the past two weeks with an episode last p.m.  The patient states he tried o get out of bed, but the would feel like he blacked out.  He states he never actually passed out but was feeling like he would pass out.  Therefore, it was elt that he did need to be admitted with a diagnosis of near syncope.  ALLERGIES:  No known drug allergies.  MEDICATIONS: 1. Coumadin 5 mg 1/2 tablet Monday, 1 tablet the rest of the days. 2. Stool softener daily. 3. Metoprolol 50 mg 1 tablet b.i.d. 4. Dipyridamole 25 mg t.i.d. 5. Lescol 40 mg q.h.s. 6. Centrum Silver vitamins 1 q.d. 7. Lanoxin 0.25 mg alternating 1/2 tablet with 1 tablet. 8. Amaryl 4 mg b.i.d. 9. Fish oil 2 g daily.  PAST MEDICAL HISTORY:  He has history of atrial fibrillation and is on chronic Coumadin therapy.  He is followed closely, and this has remained in good control. Known coronary artery disease, left ventricular dysfunction, history of angina pectoris, hypertension, hyperlipidemia, history of tobacco use, non-insulin-dependent diabetes mellitus, and macular degeneration.  SOCIAL HISTORY:  He is retired, married, denies any alcohol use.  FAMILY HISTORY: Mother died at age 75 of cancer of reproductive organs.  Father  died at age 31 of coronary artery disease.  PAST SURGICAL HISTORY:  He has recently had  colonoscopy done by Dr. Virginia Rochester which did show diverticulosis.  Coronary artery bypass grafting in 1981 and 1993. Cholecystectomy.  REVIEW OF SYSTEMS:  He states he had CABG in the past, states he has had some dizziness with feeling like he is going to pass out, difficulty with lightheadedness when he turns his head.  He states there has been no change in is vision, no change in his hearing, denies any bleeding problems.  He continues to take his regular Coumadin dose.  He does have known diabetes, has been able to maintain this under good control.  He denies any nausea, vomiting.  No heartburn. No constipation or diarrhea.  No difficulty with urination.  He states he does ave some generalized joint pain but no specific joints bother him.  He does wear prescription lenses.  He does get routine health care, did receive flu vaccine n October 2000.  There are no rashes or problems with his skin.  He denies any pain problems.  No difficulty with sleeping.  He denies any change in appetite, no change in weight.  He continues to be a nonsmoker.  He does have some episodes f feeling like his chest is pounding at night.  He does have some unsteadiness on  standing.  There has been a long history of this back  to 1998.  PHYSICAL EXAMINATION:  VITAL SIGNS:  Temperature 97, heart rate 97, respiratory rate 18, blood pressure 125/65.  Height 6 feet 1 inch, weight 208.2 pounds.  GENERAL:  This is a very well-cared-for older male, presently appears in no acute distress.  HEENT:  Pupils equal.  Extraocular movements intact.  He does have prescription  lenses.  Mouth and pharynx benign with pink and moist mucosa.  NECK:  Supple.  No JVD, no bruits.  No bradycardia with carotid massage was noted. No thyromegaly.  No cervical adenopathy.  CHEST:  Clear to auscultation.   Normal adult male breasts.  HEART:  Atrial fibrillation.  ABDOMEN:  Soft, flat.  Active bowel sounds.  Nontender over the  bladder.  EXTREMITIES:  No edema.  Full range of motion.  SKIN:  No obvious rashes.  NEUROLOGIC:  He is alert and oriented x 3.  LABORATORY, X-RAY DATA:  Labs on admission were unremarkable and within normal range.  CT scan of head showed cerebral atrophy.  EKG showed atrial fibrillation.  IMPRESSION:  Unexplained marked weakness.  PLAN:  Admission.  Physical therapy evaluation.  Urologic evaluation.  Further labs. DD:  08/16/99 TD:  08/16/99 Job: [id number]

## 2010-10-28 NOTE — Discharge Summary (Signed)
NAME:  David Davidson, David Davidson                        ACCOUNT NO.:  [id number]   MEDICAL RECORD NO.:  [id number]                   PATIENT TYPE:  OBV   LOCATION:  0371                                 FACILITY:  [facility]   PHYSICIAN:  Claudette Laws, M.D.               DATE OF BIRTH:  1910/09/03   DATE OF ADMISSION:  05/21/2003  DATE OF DISCHARGE:  05/22/2003                                 DISCHARGE SUMMARY   HISTORY:  This is a 77 year old gentleman on Coumadin who recently developed  painless, gross hematuria.  Cystoscopy in the office revealed a large  sessile to papillary bladder tumor.  The patient comes in now for  transurethral resection.  CT scan preoperatively showed no obvious  metastatic disease, no hydronephrosis.  The patient has multiple  comorbidities including a history of atrial fibrillation, coronary artery  disease.  He was on Coumadin and aspirin which he stopped about five days  preoperatively.  He sees Jaclyn Prime. Lucas Mallow, M.D. for primary care.   PAST SURGICAL HISTORY:  He has had two bypass surgeries.   ALLERGIES:  He has no known drug allergies, although he says his stomach is  intolerant to CIPRO.   LABORATORY DATA:  Hemoglobin 13.0, hematocrit 37.9, white cell count 5700.  Electrolytes showed a sodium of 146, potassium of 5.0, chloride 107, carbon  dioxide 31, BUN 29, creatinine 1.1.  Alkaline phosphatase 37.   His chest x-ray showed mild cardiomegaly, no active disease.  EKG showed  sinus bradycardia with second degree SA block.   HOSPITAL COURSE:  The patient came in on May 21, 2003 and underwent a  transurethral resection of a rather large bladder tumor involving the right  lateral wall and extending just proximal to the right ureteral orifice.  Rather extensive resection was performed.  Postoperatively we instilled 40  mg of mitomycin C 40 mg for one hour.  Postoperatively he was observed  overnight.  Uneventful postoperative course.  The next morning the  urine was  grossly clear.  No complaints.  He was sent home for me to remove the  catheter in three days now and also to discuss the final pathology report.   FINAL DIAGNOSES:  1. Large sessile/papillary bladder carcinoma, stage and grade pending.  2. History of coronary artery disease.  3. History of atrial fibrillation.  4. Coumadin patient.   OPERATION:  1. Cystoscopy.  2. Transurethral resection large 5-6 cm bladder tumor right lateral wall.   COMPLICATIONS:  None.   CONDITION ON DISCHARGE:  Stable.   DISCHARGE MEDICATIONS:  1. All of his home medications.  2. Metoprolol 50 mg one b.i.d.  3. Gemfibrozil 600 mg one b.i.d.  4. Glyburide-Metformin 2.5/500 mg one b.i.d.  5. Lanoxin 250 mcg one-half tablet on odd days, one tablet on even days.  6. Zetia 10 mg daily.  7. He is to hold off on the Coumadin and aspirin now until  I remove his     Foley catheter in three days.  8. Resume his vitamins.  9. Sulfa trimethoprim one b.i.d. #10 for antibiotic coverage.  10.      Pyridium 200 mg p.r.n. for burning or bladder spasm.   CONDITION ON DISCHARGE:  Stable.   DISPOSITION:  Regular diet.  Force fluids.  Limited activity.  Foley  catheter to a leg bag.  To see me in three days for follow-up.                                               Claudette Laws, M.D.    RFS/MEDQ  D:  05/22/2003  T:  05/22/2003  Job:  [id number]   cc:   Jaclyn Prime. Lucas Mallow, M.D.  [address]  [place]  [place] [address]  Fax: [phone]

## 2010-10-28 NOTE — H&P (Signed)
David Davidson, David Davidson              ACCOUNT NO.:  [id number]   MEDICAL RECORD NO.:  [id number]          PATIENT TYPE:  INP   LOCATION:  1431                         FACILITY:  [facility]   PHYSICIAN:  Martina Sinner, MD DATE OF BIRTH:  02-02-1911   DATE OF ADMISSION:  04/22/2005  DATE OF DISCHARGE:                                HISTORY & PHYSICAL   ADMISSION DIAGNOSIS:  Abdominal pain post transurethral resection of bladder  tumor.   HISTORY OF PRESENT ILLNESS:  David Davidson, on April 19, 2005, had a two  bladder tumors resected by Dr. Javier Glazier.  He had a catheter in  overnight.  He failed a trial of voiding and went home with a Foley  catheter.  He has been having painful bladder spasms and Oxybutynin made him  sick to his stomach.  He was in such pain his son brought him to the  emergency room.  In between spasms, he was actually feeling quite well.   I was suspect on the physical examination that he may be distended and not  emptying well.  I ultrasound his kidneys and there was no hydronephrosis in  spite of resection of the tumor near the right ureteral orifice.  There is  280 cc in the bladder.   The bladder catheter was changed from a 20-French to 22-French and it  drained almost the entire bladder volume noted on ultrasound.  He was much  relieved.   PAST MEDICAL HISTORY:  1.  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  2.  Cardiac bypass x2.  3.  Atrial fibrillation.   MEDICATIONS:  See orders.  He is on digoxin.  He is currently off his  aspirin and Lovenox and Coumadin.   SOCIAL HISTORY:  He lives in town.   FAMILY HISTORY:  Negative.   ALLERGIES:  CIPRO.   PHYSICAL EXAMINATION:  GENERAL:  He was in distress each time he had a  bladder spasm but otherwise was quite comfortable.  He was not toxic-  looking.   LABORATORY:  His hemoglobin was 9.7.  It was 12.1 prior to surgery.  His  white blood count was normal.  His electrolytes were normal.   His creatinine  had gone up from 0.9 to 1.8 and I thought this was likely prerenal.   David Davidson was admitted for observation.  He was given pain medication  either by mouth or intravenously to keep him comfortable.  I want to make  certain that his catheter drains well.  I did not think he should go home  with a catheter, and he is at high risk of failing another trial of voiding.  I gave him some Flomax, and I will discharge him home either tomorrow or the  next day depending on how he is doing.           ______________________________  Martina Sinner, MD  Electronically Signed     SAM/MEDQ  D:  04/22/2005  T:  04/22/2005  Job:  [id number]

## 2010-10-28 NOTE — Discharge Summary (Signed)
NAMEIYAD, DEROO NO.:  [id number]   MEDICAL RECORD NO.:  [id number]          PATIENT TYPE:  OIB   LOCATION:  1429                         FACILITY:  [facility]   PHYSICIAN:  Claudette Laws, M.D.  DATE OF BIRTH:  11-Feb-1911   DATE OF ADMISSION:  04/19/2005  DATE OF DISCHARGE:                                 DISCHARGE SUMMARY   HISTORY:  This is a 77 year old man who was found to have a superficial  noninvasive bladder tumor about 2 years ago. Since then, he has done well.  However, recently he developed some hematuria and follow-up surveillance  cystoscopy confirmed two rather large tumors - one on the right trigone  obscuring the right ureteral orifice, and another one right at the dome of  the bladder. He comes in for attempt at staging and transurethral resection.  He did get medical clearance from his physician, Dr. Aggie Cosier. He was on  Coumadin and then switched to Lovenox and his last dose was about 24 hours  prior to coming in.   PAST MEDICAL HISTORY:  Reveals that he has had two bypass cardiac surgeries.  No allergies, although he does have some intolerance to CIPRO. He has a  history of atrial fibrillation as well. He has also been off his aspirin.   PERTINENT LABORATORY DATA:  Electrolytes were normal with a BUN of 22, a  creatinine of 0.9. Hemoglobin 12.1, hematocrit 33.4. His prothrombin time  was 14.8. His activated partial thromboplastin was 36. A chest x-ray showed  no acute disease. His EKG showed atrial flutter with variable A-V block.   COURSE IN THE HOSPITAL:  The patient came in on April 19, 2005, and  underwent what proved to be a rather difficult TUR of a bladder tumor,  particularly at the dome. Also, the right trigonal lesion was close to the  right ureteral orifice. Postoperatively, I put in mitomycin C 40 mg. We kept  it in for about an hour in recovery room and then irrigated out the bladder.  I left a Foley catheter in overnight  and then removed it about 24 hours  later and he was sent on home. The plan now is to re-cystoscope him in 2 or  3 weeks and decide whether he needs a second look or a second attempt at a  TUR of his bladder tumor in the dome. Also, he will need an ultrasound to  make sure there is no hydronephrosis of the right kidney.   FINAL DIAGNOSES:  1.  Recurrent large papillary bladder tumor (grade and stage pending).  2.  History of large low-grade noninvasive bladder tumor.  3.  Multiple comorbidities including coronary artery disease with two bypass      surgeries as well as an angioplasty and also atrial fibrillation.   OPERATION:  Cystoscopy and transurethral resection bladder tumors.   COMPLICATIONS:  None.   CONDITION ON DISCHARGE:  Recovering, stable.   DISCHARGE MEDICATIONS:  Cipro 250 mg one b.i.d., #10. He will renew all of  his home medications and then start the Lovenox about 4-5 days  postoperatively  provided there is no bleeding.   He is to come back to see me in about 3 weeks for follow-up cystoscopy.      Claudette Laws, M.D.  Electronically Signed     RFS/MEDQ  D:  04/20/2005  T:  04/20/2005  Job:  [id number]   cc:   Jaclyn Prime. Lucas Mallow, M.D.  Fax: [phone]

## 2010-10-28 NOTE — Discharge Summary (Signed)
NAMELYNDOL, VANDERHEIDEN NO.:  [id number]   MEDICAL RECORD NO.:  [id number]          PATIENT TYPE:  INP   LOCATION:  1414                         FACILITY:  [facility]   PHYSICIAN:  Heloise Purpura, MD      DATE OF BIRTH:  [birth year]   DATE OF ADMISSION:  02/19/2006  DATE OF DISCHARGE:  02/23/2006                                 DISCHARGE SUMMARY   ADMISSION DIAGNOSIS:  Bladder cancer.   DISCHARGE DIAGNOSIS:  Bladder cancer.   PROCEDURES PERFORMED:  1. Cystoscopy.  2. Retrograde pyelography.  3. Transurethral resection of large bladder tumor.  4. Examination under anesthesia.   HISTORY AND PHYSICAL:  For full details please see admission history and  physical.  Briefly, Mr. 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  He was recently found to  have very recurrent bladder tumor and it was decided to proceed with  transurethral resection of this bladder tumor for both palliative and  therapeutic purposes as the patient had an significant hematuria requiring  him to stop his anticoagulation therapy.   HOSPITAL COURSE:  On 02/19/2006, the patient was taken to the operating room  and the above procedures were performed.  The patient tolerated his  procedures well.  Postoperatively he was transferred to the hospital for 23-  hour observation.  His urine was initially fairly clear.  He was monitored  closely overnight.  By the morning of postoperative day #1, the patient was  having significant hematuria and had developed clot urinary retention.  His  catheter was manually irrigated with multiple clots removed and he was  placed on continuous bladder irrigation.  In addition, due to the fact that  intraoperatively, the right ureteral orifice was not visualized and the  patient did have tumor in the expected area of the right ureteral orifice, a  renal ultrasound was obtained to rule out hydronephrosis.  This did  not  demonstrate any hydronephrosis consistent with a likely lateral ectopic  insertion of the ureter due to the patient's prior transurethral resections.  The patient's continuous bladder irrigation was gradually able to be weaned  over the next couple of days.  By the 02/23/2006, his continuous bladder  irrigation was able to be stopped.  He was monitored for 24 hours with his  catheter indwelling.  As his urine remained clear, it was able to be  discontinued on 02/23/2006 and he was able to be discharged home in good  condition.   DISPOSITION:  Home.   DISCHARGE MEDICATIONS:  The patient was instructed to resume his regular  home medications.  He was told to continue to hold his Coumadin and aspirin  at this time.  He was also provided a prescription for ciprofloxacin and  Vicodin to take as needed for pain.   DISCHARGE INSTRUCTIONS:  The patient was instructed that he may resume his  regular diet and ambulate as tolerated.  He was told to refrain from any  heavy lifting or strenuous activity.   FOLLOW UP:  Mr. Caudill will follow-up in 1  week to review his surgical  pathology.  Also, he will be reevaluated at that time to see if it would be  appropriate to begin his anticoagulation therapy.           ______________________________  Heloise Purpura, MD  Electronically Signed     LB/MEDQ  D:  02/28/2006  T:  03/01/2006  Job:  [id number]   cc:   Jaclyn Prime. Lucas Mallow, M.D.  Fax: [phone]

## 2010-10-28 NOTE — Op Note (Signed)
NAMEFABRIZIO, David Davidson NO.:  [id number]   MEDICAL RECORD NO.:  [id number]          PATIENT TYPE:  AMB   LOCATION:  NESC                         FACILITY:  [facility]   PHYSICIAN:  Heloise Purpura, MD      DATE OF BIRTH:  Oct 20, 1910   DATE OF PROCEDURE:  02/19/2006  DATE OF DISCHARGE:                                 OPERATIVE REPORT   PREOPERATIVE DIAGNOSIS:  Bladder cancer.   POSTOPERATIVE DIAGNOSIS:  Bladder cancer.   PROCEDURES.:  1. Cystoscopy.  2. Left retrograde pyelography.  3. Transurethral resection of large bladder tumor.  4. Examination under anesthesia.   SURGEON:  Dr. Heloise Purpura   ANESTHESIA:  General.   COMPLICATIONS:  None.   INDICATIONS:  Mr. Sikorski is a 77 year old gentleman with a history of  superficial bladder cancer.  He was recently seen in consultation at the  request of Dr. Javier Glazier.  Most recently, his pathology did demonstrate a  high-grade TA urothelial carcinoma.  He was administered postoperative  mitomycin C following his last resection in November2006.  He has been  managed with surveillance and was found by Dr. Etta Grandchild to have a recurrent  bladder tumor on cystoscopy recently.  After evaluating the patient and  discussing options, it was decided to proceed with repeat staging procedure  and transurethral resection of bladder tumors.  In addition, it was decided  to perform upper tract imaging as he had not had upper tract evaluation  recently.  Therefore the risks and benefits of the above procedures were  discussed with the patient and he consented.   DESCRIPTION OF PROCEDURE:  The patient was taken to the operating room and a  general anesthetic was administered.  He was given preoperative antibiotics,  placed in the dorsal lithotomy position, prepped and draped in the usual  sterile fashion.  Preoperative time-out was performed.  Cystourethroscopy  was performed.  This demonstrated a very large prostate with large  lateral  lobes.  Examination of the bladder demonstrated a papillary bladder tumor at  the bladder neck and just at the left side at approximately 5 o'clock.  Addition, there was noted to be two other papillary bladder tumors on the  right side of the bladder one toward the bladder neck and an additional one  in the expected place of the right ureteral orifice.  There was then noted  to be a very large bladder tumor just to the left of the dome of the  bladder.  The left ureteral orifice was then identified and intubated with a  6-French ureteral catheter.  Contrast was injected and there was no evidence  of any filling defects or dilation of the ureter or left renal collecting  system.  The right ureter could not be easily identified.  It was felt that  it may be obscured by the patient's tumor.  Indigo carmine was administered  and there was no blue efflux seen from the right side of the bladder.  Attention then turned to resection of the remaining bladder tumors.  A 26-  French resectoscope sheath was placed in the  patient's bladder and loupe  cutting resection was then used to resect the tumor at the bladder neck as  well as the tumor on the right side of the bladder just adjacent to the  bladder neck.  The bladder tumor overlying the area where the right ureteral  orifice would be expected was then also resected.  Care was taken not to use  coagulation around this area.  Again no blue efflux was seen from this area.  Note, there did appear to be a scar on the right lateral aspect of the  bladder consistent with a prior resection.  Attention then turned to the  dome of the bladder where a bladder tumor measuring approximately 6-7 cm was  identified.  This bladder tumor was systematically resected down to the  bladder wall.  Care was taken not to get too thing at the dome of bladder.  Bladder tumor was then evacuated from the bladder with a Toomey syringe.  The bladder tumor sites were  then all fulgurated with electrocoagulation.  This resulted in excellent hemostasis.  There was noted to be an area  lateral to the expected area of the right ureteral orifice.  Again indigo  carmine was administered but still no blue efflux was seen from the right  side of the bladder.  The small area just on the lateral aspect of the  bladder was then attempted to be intubated with a Glidewire.  However, this  did not demonstrate any patent ureteral opening.  The patient's bladder was  then emptied and reinspected.  Hemostasis appeared excellent.  With the  bladder emptied, an examination under anesthesia was performed.  This  demonstrated no obvious nodularity of the prostate.  There was not noted to  be a three-dimensional mass.  The patient appeared to tolerate the procedure  well without problems.  He was able to be awakened and transferred to  recovery room in satisfactory condition.           ______________________________  Heloise Purpura, MD  Electronically Signed     LB/MEDQ  D:  02/19/2006  T:  02/20/2006  Job:  [id number]

## 2010-10-28 NOTE — Cardiovascular Report (Signed)
[place]  Patient:    David Davidson, David Davidson Visit Number: [id number] MRN: [id number]          Service Type: CAT Location: [id number] Attending Physician:  Silvestre Mesi Dictated by:   Aram Candela. Aleen Campi, M.D. Proc. Date: 10/10/01 Admit Date:  10/10/2001 Discharge Date: 10/11/2001   CC:         Cardiac Catheterization Lab   Cardiac Catheterization  PROCEDURES: 1. Left heart catheterization. 2. Coronary cineangiography. 3. Vein graft cineangiography. 4. Left internal mammary arterial graft cineangiography. 5. Left ventricular cineangiography. 6. Abdominal aortogram. 7. Angioplasty with primary stenting of the right coronary artery vein graft. 8. Percutaneous transluminal coronary angioplasty of the first obtuse marginal    branch in distal vein graft anastomotic site. 9. Perclose of the right femoral artery.  INDICATION FOR PROCEDURES:  This 77 year old male has a long history of coronary artery disease with coronary artery bypass graft surgery, first in 50 at [facility].  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  He has done well until recently when he again has had development of an unstable clinical course.  He has had an abnormal stress test and has developed pain on mild exertion and occasionally at rest.  This is typical of his previous anginal pain.  He also has a history of noninsulin-dependent diabetes mellitus, chronic atrial flutter, chronic Coumadin treatment, and hypertension.  DESCRIPTION OF PROCEDURE:  After signing an informed consent, the patient was premedicated with 50 mg of Benadryl intravenously and brought to the cardiac catheterization lab.  His right groin is prepped and draped in a sterile fashion and anesthetized locally with 1% lidocaine.  A #6 French introducer sheath was inserted percutaneously into  the right femoral artery.  The #6 [place] #4 Judkins coronary catheters were used to make injections into the native coronary arteries.  The right coronary catheter was used to make injections into the vein grafts and also into the left internal mammary graft to the LAD.  A #6 French pigtail catheter was used to measure pressures in the left ventricular and aorta and make midstream injections into the left ventricle and abdominal aorta.  After noting a critical stenosis with decreased flow in his right coronary artery vein graft and also a critical stenosis in his distal anastomotic site in his OM-1 vein graft, we discussed these findings with the patient and elected to proceed with an angioplasty procedure.  We then inserted a #6 [place] JR-4 guide catheter which was advanced to the ascending aorta.  A short Hi-Torque Floppy guidewire was inserted through the guide catheter and advanced into the right coronary artery vein graft.  We then selected a 4.5 x 16-mm Express stent deployment system which was advanced over the guidewire and positioned within the proximal lesion in the right coronary artery vein graft.  The vein graft was deployed with 2 inflations, the first at 16 atmospheres for 50 seconds, and the second at 20 atmospheres for 39 seconds.  After the second deployment inflation, the deployment balloon was removed, and injection again into the right coronary artery vein graft showed an excellent angiographic result with 0% residual lesion and no evidence for dissection or clot.  There was reestablishment of normal TIMI-3 antegrade flow.  We then attempted to redirect this catheter into the obtuse marginal vein graft; however, the takeoff of this OM vein graft was in  an unusual site for a vein graft and we were unable to approach it with the tip of this catheter.  We then selected a multipurpose catheter with which we were able to engage the ostium and make better angiographic  pictures to visualize the distal anastomotic lesion.  We then selected a #7 Jamaica MPH-2 guide catheter and changed the #6 French femoral artery sheath with a #7 [place] sheath and then advanced this #7 [place] guide catheter to the ascending aorta.  We were able to engage the OM vein graft quite readily with this guide catheter and advance the short Hi-Torque Floppy guidewire into the OM vein graft.  With moderate difficulty, this wire was passed into the distal segment of the first obtuse marginal branch.  This distal anastomotic site lesion extended both in the retrograde limb and the distal limb of the obtuse marginal branch and therefore, we selected a second guidewire to attempt to instrument the proximal limb of the obtuse marginal branch.  Two guidewires were attempted:  a luge guidewire and a Cross-It 100 guidewire.  After multiple attempts with both guidewires, we were unable to instrument the proximal limb of the obtuse marginal branch and therefore proceeded with angioplasty of the anastomotic site leading to the distal limb. We chose a 2.5 x 15-mm Maverick balloon catheter which was advanced over a Hi-Torque Floppy guidewire and positioned within the lesion.  One inflation was made at 4 atmospheres for 40 seconds.  After this inflation, balloon catheters removed and injection again into the OM vein graft showed a good angiographic result with a 30% residual lesion and no evidence for dissection and preservation of retrograde flow into the proximal limb of the OM branch. With this marked improvement in the anastomotic lesion and preservation of the proximal segment of the OM, we felt that this was a good angiographic result and had reestablished normal TIMI-3 antegrade flow.  At the end of the procedure, the catheter and sheath were removed from the right femoral artery and hemostasis was easily obtained with a Perclose closure system.  MEDICATIONS GIVEN:  Heparin 4000 units IV,  Integrilin drip per pharmacy protocol, morphine 2 mg IV, Versed 1 mg IV.  HEMODYNAMIC DATA:  1. Left ventricular pressure 136/5-18. 2. Aortic pressure 137/55 with a mean of 86. 3. Left ventricular ejection fraction was estimated at 50%.  CINE FINDINGS:  CORONARY CINEANGIOGRAPHY: 1. Left coronary artery:  The left main ostium appears normal.  The distal    half of the left main has a severe stenotic lesion which tapers to the    bifurcation causing a 90% stenosis at the bifurcation.  This lesion also    extends into the LAD and circumflex with the LAD being supplied in its    most proximal segment supplying 2 small septal branches and 1 small    diagonal branch.  The LAD is then totally occluded in its proximal    segment.  The mid and distal segment fills by way of left internal mammary    graft.  The circumflex is also totally occluded in the proximal segment.    The large OM-1 filled by way of the vein graft.  The large diagonal branch    also filled by way of vein graft.  2. Right coronary artery:  The right coronary artery is totally occluded at    its origin.  VEIN GRAFT CINEANGIOGRAPHY: 1. Several old vein graft sites were found and again documented to be totally  occluded from his first surgery.  Three vein graft sites were found with    patent vein grafts:  The first is a vein graft to the large first diagonal    branch, and this vein graft appears normal with normal antegrade flow and    very good runoff in the diagonal branch and also into the mid LAD.  2. The second vein graft found was the right coronary artery vein graft    showing a critical, greater than 95% focal stenotic lesion in its proximal    segment.  There was slow antegrade flow.  3. The third vein graft located was a vein graft to his first obtuse marginal    branch which appears normal in its proximal and middle segments.  A distal    anastomotic site has a severe critical 95% stenosis which involves  the    proximal and distal limb of this obtuse marginal branch.  4. Left internal mammary artery graft to the LAD:  This arterial graft appears    normal with normal flow into the distal LAD.  ANGIOPLASTY CINEANGIOGRAPHY:  Cineangiography taken during the angioplasty procedure shows proper positioning of the guidewire and stent deployment system in the right coronary artery vein graft.  Further cineangiography showed an excellent angiographic result within the right coronary artery vein graft.  There was normal antegrade flow and normal distal runoff.  There was no dissection or clot.  Further cineangiography showed proper positioning of the guidewire across the distal anastomotic site in the obtuse marginal vein graft and proper positioning of the balloon catheter across this lesion leading to the distal leg of the OM branch.  Further cineangiography showed a very good angiographic result with a 30% residual lesion and with reestablishment of normal TIMI-3 antegrade flow.  There was no evidence for dissection or clot and there was preservation of flow in the proximal limb of the obtuse marginal branch.  FINAL DIAGNOSES:  1. Severe three-vessel coronary artery disease with total occlusion of all 3     major branches.  2. Vein graft failure with 95% stenosis of the proximal right coronary artery     vein graft.  3. A 95% stenosis of the distal anastomotic site in the obtuse marginal vein     graft.  4. Normal vein graft to the diagonal branch.  5. Normal left internal mammary graft to the left anterior descending artery.  6. Normal left ventricular function, ejection fraction 50%.  7. General atherosclerosis in the abdominal aorta with normal renal arteries.  8. Successful angioplasty with primary stenting of the proximal right     coronary artery vein graft.  9. Successful percutaneous transluminal coronary angioplasty of the obtuse     marginal vein graft distal anastomotic  site. 10. Successful Perclose of the right femoral artery.  DISPOSITION:  Will monitor on the EAD overnight and restart his usual Coumadin dose.  Will continue the Integrilin drip for 18 hours.  Will hold off on giving aspirin and will start Plavix today. Dictated by:   Aram Candela. Aleen Campi, M.D. Attending Physician:  Silvestre Mesi DD:  10/10/01 TD:  10/12/01 Job: [id number]

## 2010-10-28 NOTE — Discharge Summary (Signed)
NAMECLIFF, DAMIANI NO.:  [id number]   MEDICAL RECORD NO.:  [id number]          PATIENT TYPE:  OIB   LOCATION:  1431                         FACILITY:  [facility]   PHYSICIAN:  Heloise Purpura, MD      DATE OF BIRTH:  1911/06/08   DATE OF ADMISSION:  05/09/2006  DATE OF DISCHARGE:  05/10/2006                               DISCHARGE SUMMARY   ADMISSION DIAGNOSIS:  Bladder cancer.   DISCHARGE DIAGNOSIS:  Bladder cancer.   HISTORY:  For full details, please see admission history and physical.  Briefly, Mr. Sebesta is a 77 year old gentleman with a history of  superficial bladder cancer.  He was admitted to the hospital after  undergoing a restaging transurethral resection.   HOSPITAL COURSE:  Mr. Shipley tolerated his procedure without difficulty.  During his procedure, he was noted to have some recurrent tumor both  near the dome of the bladder were his previous large tumor was  identified.  In addition, he had multiple small bladder tumors in other  regions of the bladder.  All visible tumor was able to be removed.  Postoperatively, the patient was maintained with Foley catheter and  monitored overnight.  He did very well and his Foley catheter was able  to be removed the following morning.  He was able to pass a voiding  trial and was therefore for able to be discharged in excellent  condition.   PATHOLOGY:  The patient's preliminary pathology report demonstrated a  low-grade urothelial carcinoma of the bladder without evidence of  recurrent high-grade tumor and no evidence of muscle invasion.   DISCHARGE INSTRUCTIONS:  I told Mr. Silvestro that he should continue his  regular home medications excepting his aspirin and Coumadin at this  time.  He will plan to see me back in approximately 10 days.  If his  urine remains clear at that time, I will plan to have him restart his  anticoagulation and then to follow-up with Dr. Lucas Mallow for monitoring of  his INR.   FOLLOW  UP:  Mr. Rode will follow up in 10 days for further evaluation  and to discuss his final pathology.  If the final pathology report is  consistent with a nonmuscle invasive bladder cancer, we will discussing  pros and cons of proceeding with BCG intravesical therapy versus  surveillance considering the patient's age and medical comorbidities.           ______________________________  Heloise Purpura, MD  Electronically Signed     LB/MEDQ  D:  05/10/2006  T:  05/10/2006  Job:  [id number]   cc:   Jaclyn Prime. Lucas Mallow, M.D.  Fax: [phone]

## 2011-03-02 LAB — DIFFERENTIAL
Basophils Relative: 1
Eosinophils Absolute: 0
Lymphs Abs: 1
Monocytes Absolute: 1.1 — ABNORMAL HIGH
Monocytes Relative: 12
Neutro Abs: 6.6

## 2011-03-02 LAB — DIGOXIN LEVEL: Digoxin Level: <0.2 — ABNORMAL LOW

## 2011-03-02 LAB — I-STAT 8, (EC8 V) (CONVERTED LAB)
Bicarbonate: 25.8 — ABNORMAL HIGH
HCT: 36 — ABNORMAL LOW
Potassium: 5.2 — ABNORMAL HIGH
TCO2: 27
pH, Ven: 7.365 — ABNORMAL HIGH

## 2011-03-02 LAB — TROPONIN I: Troponin I: 0.02

## 2011-03-02 LAB — POTASSIUM: Potassium: 4.6

## 2011-03-02 LAB — POCT CARDIAC MARKERS
CKMB, poc: <1 — ABNORMAL LOW
Operator id: 285491
Troponin i, poc: <0.05

## 2011-03-02 LAB — POCT I-STAT CREATININE: Operator id: 285491

## 2011-03-02 LAB — CARDIAC PANEL(CRET KIN+CKTOT+MB+TROPI)
Relative Index: INVALID
Troponin I: 0.03

## 2011-03-02 LAB — CBC
Hemoglobin: 11.6 — ABNORMAL LOW
MCHC: 34.6
MCV: 98.2
RBC: 3.43 — ABNORMAL LOW

## 2011-03-02 LAB — CK TOTAL AND CKMB (NOT AT ARMC): CK, MB: 1.2

## 2011-03-03 LAB — B-NATRIURETIC PEPTIDE (CONVERTED LAB)
Pro B Natriuretic peptide (BNP): 116 — ABNORMAL HIGH
Pro B Natriuretic peptide (BNP): 83

## 2011-03-03 LAB — BASIC METABOLIC PANEL
BUN: 34 — ABNORMAL HIGH
BUN: 35 — ABNORMAL HIGH
CO2: 24
CO2: 28
Calcium: 8.7
Chloride: 105
Creatinine, Ser: 0.94
Creatinine, Ser: 1.4
GFR calc non Af Amer: 47 — ABNORMAL LOW
Glucose, Bld: 130 — ABNORMAL HIGH
Glucose, Bld: 140 — ABNORMAL HIGH
Potassium: 3.8
Sodium: 140

## 2011-03-03 LAB — CBC
HCT: 34.4 — ABNORMAL LOW
Hemoglobin: 11.8 — ABNORMAL LOW
MCV: 96.8
Platelets: 155
RDW: 13.5

## 2011-03-03 LAB — CARDIAC PANEL(CRET KIN+CKTOT+MB+TROPI)
CK, MB: 1.3
CK, MB: 1.5
Total CK: 135
Troponin I: 0.02

## 2011-03-03 LAB — IRON AND TIBC: Saturation Ratios: 18 — ABNORMAL LOW

## 2011-03-03 LAB — RETICULOCYTES: RBC.: 3.62 — ABNORMAL LOW

## 2011-03-03 LAB — FERRITIN: Ferritin: 119 (ref 22–322)

## 2011-03-03 LAB — PROTIME-INR: Prothrombin Time: 29.1 — ABNORMAL HIGH

## 2011-03-03 LAB — FOLATE: Folate: >20

## 2011-04-13 DIAGNOSIS — J189 Pneumonia, unspecified organism: Secondary | ICD-10-CM

## 2011-04-13 HISTORY — DX: Pneumonia, unspecified organism: J18.9

## 2011-05-09 ENCOUNTER — Inpatient Hospital Stay (HOSPITAL_COMMUNITY)
Admission: EM | Admit: 2011-05-09 | Discharge: 2011-05-17 | DRG: 193 | Disposition: A | Discharge status: Skilled Nursing Facility | Payer: Medicare Other | Attending: Internal Medicine | Admitting: Internal Medicine

## 2011-05-09 ENCOUNTER — Other Ambulatory Visit: Payer: Self-pay

## 2011-05-09 ENCOUNTER — Encounter: Payer: Self-pay | Admitting: Family Medicine

## 2011-05-09 ENCOUNTER — Emergency Department (HOSPITAL_COMMUNITY): Payer: Medicare Other

## 2011-05-09 DIAGNOSIS — I480 Paroxysmal atrial fibrillation: Secondary | ICD-10-CM | POA: Diagnosis present

## 2011-05-09 DIAGNOSIS — E1165 Type 2 diabetes mellitus with hyperglycemia: Secondary | ICD-10-CM | POA: Diagnosis present

## 2011-05-09 DIAGNOSIS — I2510 Atherosclerotic heart disease of native coronary artery without angina pectoris: Secondary | ICD-10-CM | POA: Diagnosis present

## 2011-05-09 DIAGNOSIS — E118 Type 2 diabetes mellitus with unspecified complications: Secondary | ICD-10-CM | POA: Diagnosis present

## 2011-05-09 DIAGNOSIS — E785 Hyperlipidemia, unspecified: Secondary | ICD-10-CM

## 2011-05-09 DIAGNOSIS — Z951 Presence of aortocoronary bypass graft: Secondary | ICD-10-CM

## 2011-05-09 DIAGNOSIS — I4891 Unspecified atrial fibrillation: Secondary | ICD-10-CM | POA: Diagnosis present

## 2011-05-09 DIAGNOSIS — J9600 Acute respiratory failure, unspecified whether with hypoxia or hypercapnia: Secondary | ICD-10-CM | POA: Diagnosis present

## 2011-05-09 DIAGNOSIS — J159 Unspecified bacterial pneumonia: Principal | ICD-10-CM | POA: Diagnosis present

## 2011-05-09 DIAGNOSIS — I10 Essential (primary) hypertension: Secondary | ICD-10-CM

## 2011-05-09 DIAGNOSIS — I509 Heart failure, unspecified: Secondary | ICD-10-CM | POA: Diagnosis present

## 2011-05-09 DIAGNOSIS — Z66 Do not resuscitate: Secondary | ICD-10-CM | POA: Diagnosis present

## 2011-05-09 DIAGNOSIS — R509 Fever, unspecified: Secondary | ICD-10-CM

## 2011-05-09 DIAGNOSIS — F05 Delirium due to known physiological condition: Secondary | ICD-10-CM | POA: Diagnosis present

## 2011-05-09 DIAGNOSIS — E119 Type 2 diabetes mellitus without complications: Secondary | ICD-10-CM | POA: Diagnosis present

## 2011-05-09 DIAGNOSIS — J189 Pneumonia, unspecified organism: Secondary | ICD-10-CM

## 2011-05-09 DIAGNOSIS — I359 Nonrheumatic aortic valve disorder, unspecified: Secondary | ICD-10-CM | POA: Diagnosis present

## 2011-05-09 DIAGNOSIS — D696 Thrombocytopenia, unspecified: Secondary | ICD-10-CM | POA: Diagnosis present

## 2011-05-09 HISTORY — DX: Atherosclerotic heart disease of native coronary artery without angina pectoris: I25.10

## 2011-05-09 HISTORY — DX: Acute myocardial infarction, unspecified: I21.9

## 2011-05-09 HISTORY — DX: Cardiac arrhythmia, unspecified: I49.9

## 2011-05-09 HISTORY — DX: Heart failure, unspecified: I50.9

## 2011-05-09 HISTORY — DX: Essential (primary) hypertension: I10

## 2011-05-09 LAB — EXPECTORATED SPUTUM ASSESSMENT W GRAM STAIN, RFLX TO RESP C

## 2011-05-09 LAB — CBC
HCT: 35.5 % — ABNORMAL LOW (ref 39.0–52.0)
MCH: 32.6 pg (ref 26.0–34.0)
MCV: 100.6 fL — ABNORMAL HIGH (ref 78.0–100.0)
RDW: 14.1 % (ref 11.5–15.5)
WBC: 5.4 10*3/uL (ref 4.0–10.5)

## 2011-05-09 LAB — URINALYSIS, ROUTINE W REFLEX MICROSCOPIC
Bilirubin Urine: NEGATIVE
Hgb urine dipstick: NEGATIVE
Specific Gravity, Urine: 1.024 (ref 1.005–1.030)
pH: 5.5 (ref 5.0–8.0)

## 2011-05-09 LAB — BASIC METABOLIC PANEL
BUN: 22 mg/dL (ref 6–23)
CO2: 26 mEq/L (ref 19–32)
Calcium: 8.9 mg/dL (ref 8.4–10.5)
Chloride: 104 mEq/L (ref 96–112)
Creatinine, Ser: 1.1 mg/dL (ref 0.50–1.35)
GFR calc Af Amer: 62 mL/min — ABNORMAL LOW (ref >90)

## 2011-05-09 MED ORDER — ALBUTEROL SULFATE (5 MG/ML) 0.5% IN NEBU
INHALATION_SOLUTION | RESPIRATORY_TRACT | Status: AC
  Administered 2011-05-09: 2.5 mg via RESPIRATORY_TRACT
  Filled 2011-05-09: qty 0.5

## 2011-05-09 MED ORDER — GEMFIBROZIL 600 MG PO TABS
600.0000 mg | ORAL_TABLET | Freq: Two times a day (BID) | ORAL | Status: DC
  Administered 2011-05-09 – 2011-05-17 (×16): 600 mg via ORAL
  Filled 2011-05-09 (×17): qty 1

## 2011-05-09 MED ORDER — WARFARIN SODIUM 7.5 MG PO TABS
7.5000 mg | ORAL_TABLET | Freq: Once | ORAL | Status: AC
  Administered 2011-05-09: 7.5 mg via ORAL
  Filled 2011-05-09: qty 1

## 2011-05-09 MED ORDER — FINASTERIDE 5 MG PO TABS
5.0000 mg | ORAL_TABLET | Freq: Every day | ORAL | Status: DC
  Administered 2011-05-09 – 2011-05-17 (×9): 5 mg via ORAL
  Filled 2011-05-09 (×9): qty 1

## 2011-05-09 MED ORDER — ACETAMINOPHEN 325 MG PO TABS
650.0000 mg | ORAL_TABLET | Freq: Four times a day (QID) | ORAL | Status: DC | PRN
  Administered 2011-05-10: 650 mg via ORAL
  Filled 2011-05-09: qty 2

## 2011-05-09 MED ORDER — OMEGA-3-ACID ETHYL ESTERS 1 G PO CAPS
1.0000 g | ORAL_CAPSULE | Freq: Every day | ORAL | Status: DC
  Administered 2011-05-09 – 2011-05-17 (×9): 1 g via ORAL
  Filled 2011-05-09 (×9): qty 1

## 2011-05-09 MED ORDER — OMEGA-3 FATTY ACIDS 1000 MG PO CAPS: 1.0000 g | ORAL_CAPSULE | Freq: Every day | ORAL | Status: DC

## 2011-05-09 MED ORDER — ALBUTEROL SULFATE (5 MG/ML) 0.5% IN NEBU
2.5000 mg | INHALATION_SOLUTION | Freq: Four times a day (QID) | RESPIRATORY_TRACT | Status: DC
  Administered 2011-05-09 – 2011-05-10 (×6): 2.5 mg via RESPIRATORY_TRACT
  Filled 2011-05-09 (×5): qty 0.5

## 2011-05-09 MED ORDER — ACETAMINOPHEN 650 MG RE SUPP: 650.0000 mg | Freq: Four times a day (QID) | RECTAL | Status: DC | PRN

## 2011-05-09 MED ORDER — LEVOTHYROXINE SODIUM 25 MCG PO TABS
25.0000 ug | ORAL_TABLET | Freq: Every day | ORAL | Status: DC
  Administered 2011-05-10 – 2011-05-17 (×8): 25 ug via ORAL
  Filled 2011-05-09 (×8): qty 1

## 2011-05-09 MED ORDER — GUAIFENESIN ER 600 MG PO TB12
600.0000 mg | ORAL_TABLET | Freq: Two times a day (BID) | ORAL | Status: DC
  Administered 2011-05-09 – 2011-05-17 (×16): 600 mg via ORAL
  Filled 2011-05-09 (×17): qty 1

## 2011-05-09 MED ORDER — EZETIMIBE 10 MG PO TABS
10.0000 mg | ORAL_TABLET | Freq: Every day | ORAL | Status: DC
  Administered 2011-05-09 – 2011-05-17 (×9): 10 mg via ORAL
  Filled 2011-05-09 (×9): qty 1

## 2011-05-09 MED ORDER — SODIUM CHLORIDE 0.9 % IV SOLN
Freq: Once | INTRAVENOUS | Status: AC
  Administered 2011-05-09: 05:00:00 via INTRAVENOUS

## 2011-05-09 MED ORDER — DEXTROSE 5 % IV SOLN
1.0000 g | INTRAVENOUS | Status: DC
  Administered 2011-05-09 – 2011-05-10 (×2): 1 g via INTRAVENOUS
  Filled 2011-05-09 (×3): qty 10

## 2011-05-09 MED ORDER — BISOPROLOL FUMARATE 10 MG PO TABS
10.0000 mg | ORAL_TABLET | Freq: Every day | ORAL | Status: DC
  Administered 2011-05-09 – 2011-05-17 (×9): 10 mg via ORAL
  Filled 2011-05-09 (×9): qty 1

## 2011-05-09 MED ORDER — TAMSULOSIN HCL 0.4 MG PO CAPS
0.4000 mg | ORAL_CAPSULE | Freq: Every day | ORAL | Status: DC
  Administered 2011-05-09 – 2011-05-17 (×9): 0.4 mg via ORAL
  Filled 2011-05-09 (×9): qty 1

## 2011-05-09 MED ORDER — DEXTROSE 5 % IV SOLN
500.0000 mg | INTRAVENOUS | Status: DC
  Administered 2011-05-09 – 2011-05-13 (×5): 500 mg via INTRAVENOUS
  Filled 2011-05-09 (×6): qty 500

## 2011-05-09 MED ORDER — VITAMIN D3 25 MCG (1000 UNIT) PO TABS
2000.0000 [IU] | ORAL_TABLET | Freq: Every day | ORAL | Status: DC
  Administered 2011-05-09 – 2011-05-17 (×9): 2000 [IU] via ORAL
  Filled 2011-05-09 (×9): qty 2

## 2011-05-09 MED ORDER — ACETAMINOPHEN 325 MG PO TABS
650.0000 mg | ORAL_TABLET | Freq: Once | ORAL | Status: AC
  Administered 2011-05-09: 650 mg via ORAL
  Filled 2011-05-09: qty 2

## 2011-05-09 MED ORDER — ASPIRIN 81 MG PO CHEW
81.0000 mg | CHEWABLE_TABLET | Freq: Every day | ORAL | Status: DC
  Administered 2011-05-09 – 2011-05-17 (×9): 81 mg via ORAL
  Filled 2011-05-09 (×9): qty 1

## 2011-05-09 MED ORDER — GLYBURIDE 5 MG PO TABS
5.0000 mg | ORAL_TABLET | Freq: Four times a day (QID) | ORAL | Status: DC
  Administered 2011-05-10 (×3): 5 mg via ORAL
  Filled 2011-05-09 (×9): qty 1

## 2011-05-09 NOTE — ED Notes (Signed)
Per EMS, patient has been having shortness of breath since Saturday. Also states coughing up bloody sputum.

## 2011-05-09 NOTE — ED Notes (Signed)
  Report called to the floor given to [facility]

## 2011-05-09 NOTE — ED Provider Notes (Signed)
History     CSN: [id number] Arrival date & time: 05/09/2011  3:47 AM   First MD Initiated Contact with Patient 05/09/11 0354      Chief Complaint  Patient presents with  . Shortness of Breath    (Consider location/radiation/quality/duration/timing/severity/associated sxs/prior treatment) The history is provided by the patient and a relative.   patient reports 4-5 days of productive cough now with worsening shortness of breath and new hemoptysis.  The patient is on Coumadin.  He is active for age 48.  He lives independently.  His O2 sats on arrival were 88%.  Patient denies fever chills.  He denies unilateral leg swelling.  No history of DVT or pulmonary embolism.  His history of CHF hypertension diabetes and MI.  The patient reports the oxygen has improved his breathing.  His symptoms are worsened by walking.  Improved by oxygen the rest.  His symptoms are mild.  Past Medical History  Diagnosis Date  . Myocardial infarct   . Diabetes mellitus   . Hypertension   . CHF (congestive heart failure)     Past Surgical History  Procedure Date  . Coronary artery bypass graft     No family history on file.  History  Substance Use Topics  . Smoking status: Not on file  . Smokeless tobacco: Not on file  . Alcohol Use:       Review of Systems  All other systems reviewed and are negative.    Allergies  Morphine and related  Home Medications   Current Outpatient Rx  Name Route Sig Dispense Refill  . ASPIRIN 81 MG PO TABS Oral Take 81 mg by mouth daily.      Marland Kitchen BISOPROLOL FUMARATE 5 MG PO TABS Oral Take 10 mg by mouth daily.      Marland Kitchen VITAMIN D 1000 UNITS PO TABS Oral Take 2,000 Units by mouth daily.      Marland Kitchen EZETIMIBE 10 MG PO TABS Oral Take 10 mg by mouth daily.      Marland Kitchen FINASTERIDE 5 MG PO TABS Oral Take 5 mg by mouth daily.      . OMEGA-3 FATTY ACIDS 1000 MG PO CAPS Oral Take 1 g by mouth daily.      . FUROSEMIDE 20 MG PO TABS Oral Take 20 mg by mouth daily.      Marland Kitchen  GEMFIBROZIL 600 MG PO TABS Oral Take 600 mg by mouth 2 (two) times daily before a meal.      . GLYBURIDE 5 MG PO TABS Oral Take 5 mg by mouth QID.      Marland Kitchen LEVOTHYROXINE SODIUM 25 MCG PO TABS Oral Take 25 mcg by mouth daily.      Marland Kitchen TAMSULOSIN HCL 0.4 MG PO CAPS Oral Take 0.4 mg by mouth daily.      . WARFARIN SODIUM 5 MG PO TABS Oral Take 5 mg by mouth daily. Mon, Thurs, Sat     . WARFARIN SODIUM 7.5 MG PO TABS Oral Take 7.5 mg by mouth daily. Tues, Wed, Fri, Sun       BP 125/73  Pulse 95  Temp(Src) 101.3 F (38.5 C) (Rectal)  Resp 16  SpO2 96%  Physical Exam  Nursing note and vitals reviewed. Constitutional: He is oriented to person, place, and time. He appears well-developed and well-nourished.  HENT:  Head: Normocephalic and atraumatic.  Eyes: EOM are normal.  Neck: Normal range of motion.  Cardiovascular: Normal rate, regular rhythm, normal heart sounds and intact distal  pulses.   Pulmonary/Chest: Effort normal and breath sounds normal. No respiratory distress.  Abdominal: Soft. He exhibits no distension. There is no tenderness.  Musculoskeletal: Normal range of motion.  Neurological: He is alert and oriented to person, place, and time.  Skin: Skin is warm and dry.  Psychiatric: He has a normal mood and affect. Judgment normal.    ED Course  Procedures (including critical care time)  Labs Reviewed  CBC - Abnormal; Notable for the following:    RBC 3.53 (*)    Hemoglobin 11.5 (*)    HCT 35.5 (*)    MCV 100.6 (*)    Platelets 75 (*)    All other components within normal limits  BASIC METABOLIC PANEL - Abnormal; Notable for the following:    Glucose, Bld 133 (*)    GFR calc non Af Amer 54 (*)    GFR calc Af Amer 62 (*)    All other components within normal limits  PROTIME-INR - Abnormal; Notable for the following:    Prothrombin Time 21.8 (*)    INR 1.86 (*)    All other components within normal limits  TROPONIN I  LACTIC ACID, PLASMA  CULTURE, BLOOD (ROUTINE X 2)    CULTURE, BLOOD (ROUTINE X 2)  URINALYSIS, ROUTINE W REFLEX MICROSCOPIC  URINE CULTURE   Dg Chest 2 View  05/09/2011  *RADIOLOGY REPORT*  Clinical Data: Cough, shortness of breath and fever.  CHEST - 2 VIEW  Comparison: Chest radiograph performed 10/31/2008  Findings: The lungs are well-aerated.  Streaky bilateral airspace opacities, most prominent at the mid lung zones bilaterally and left lung base, raise suspicion for pneumonia.  Mild underlying vascular congestion is again noted.  There is no evidence of significant pleural effusion or pneumothorax.  The heart is mildly enlarged; the patient is status post median sternotomy, with evidence of prior CABG.  No acute osseous abnormalities are seen.  IMPRESSION:  1.  Streaky bilateral airspace opacities, most prominent at the midlung zones bilaterally and at the left lung base, raise suspicion for pneumonia. 2.  Mild vascular congestion and mild cardiomegaly again noted.  Original Report Authenticated By: Tonia Ghent, M.D.     1. CAP (community acquired pneumonia)   2. Fever       MDM  Concerning for community-acquired pneumonia.  Chest x-ray pending.  Full blood and urine cultures obtained.  Lactate sent.  Rectal temp is 101.3 in the room.  O2 sats on 2 L oxygen are 94%.  His pulse rate is normal.  Will admit to triad. Spoke with Dr Conley Rolls        Lyanne Co, MD 05/09/11 [phone]

## 2011-05-09 NOTE — ED Notes (Signed)
Patient transported to X-ray 

## 2011-05-09 NOTE — ED Notes (Signed)
Pt placed on monitor.  

## 2011-05-09 NOTE — ED Notes (Signed)
Monitor a-fib vr 90s

## 2011-05-09 NOTE — ED Notes (Signed)
[id number]<AV> Expected date:<BR> Expected time:<BR> Means of arrival:<BR> Comments:<BR> closed

## 2011-05-09 NOTE — ED Notes (Signed)
RN Swinton made aware of critical respiratory rate.

## 2011-05-09 NOTE — Progress Notes (Signed)
ANTICOAGULATION CONSULT NOTE - Initial Consult  Pharmacy Consult for Coumadin Indication: atrial fibrillation  Allergies  Allergen Reactions  . Morphine And Related     Patient Measurements: Height: 6\' 1"  (185.4 cm) Weight: 209 lb 11.2 oz (95.119 kg) IBW/kg (Calculated) : 79.9    Vital Signs: Temp: 99.1 F (37.3 C) (11/27 1640) Temp src: Oral (11/27 1640) BP: 132/75 mmHg (11/27 1640) Pulse Rate: 107  (11/27 1640)  Labs:  Basename 05/09/11 0420  HGB 11.5*  HCT 35.5*  PLT 75*  APTT --  LABPROT 21.8*  INR 1.86*  HEPARINUNFRC --  CREATININE 1.10  CKTOTAL --  CKMB --  TROPONINI <0.30   Estimated Creatinine Clearance: 42.4 ml/min (by C-G formula based on Cr of 1.1).  Medical History: Past Medical History  Diagnosis Date  . Myocardial infarct   . Diabetes mellitus   . Hypertension   . CHF (congestive heart failure)   . Coronary artery disease   . Dysrhythmia     Medications:  Prescriptions prior to admission  Medication Sig Dispense Refill  . aspirin 81 MG tablet Take 81 mg by mouth daily.        . bisoprolol (ZEBETA) 5 MG tablet Take 10 mg by mouth daily.        . cholecalciferol (VITAMIN D) 1000 UNITS tablet Take 2,000 Units by mouth daily.        Marland Kitchen ezetimibe (ZETIA) 10 MG tablet Take 10 mg by mouth daily.        . finasteride (PROSCAR) 5 MG tablet Take 5 mg by mouth daily.        . fish oil-omega-3 fatty acids 1000 MG capsule Take 1 g by mouth daily.        . furosemide (LASIX) 20 MG tablet Take 20 mg by mouth daily.        Marland Kitchen gemfibrozil (LOPID) 600 MG tablet Take 600 mg by mouth 2 (two) times daily before a meal.        . glyBURIDE (DIABETA) 5 MG tablet Take 5 mg by mouth QID.        Marland Kitchen guaifenesin (HUMIBID E) 400 MG TABS Take 400 mg by mouth every 4 (four) hours.        Marland Kitchen levothyroxine (SYNTHROID, LEVOTHROID) 25 MCG tablet Take 25 mcg by mouth daily.        . Tamsulosin HCl (FLOMAX) 0.4 MG CAPS Take 0.4 mg by mouth daily.        Marland Kitchen warfarin (COUMADIN) 5  MG tablet Take 5 mg by mouth daily. Mon, Thurs, Sat       . warfarin (COUMADIN) 7.5 MG tablet Take 7.5 mg by mouth daily. Tues, Wed, Fri, Sun         Assessment: 77 y/o male patient admitted with CAP, on chronic coumadin for h/o afib. INR subtherapeutic. Noted baseline thrombocytopenia. No bleeding reported.   Goal of Therapy:  INR 2-3   Plan:  Coumadin 7.5mg  po today and follow up daily protime.  Verlene Mayer, PharmD, BCPS Pager [phone]  05/09/2011,5:29 PM

## 2011-05-09 NOTE — ED Notes (Signed)
FAO:[id number]<MV> Expected date:<BR> Expected time:<BR> Means of arrival:<BR> Comments:<BR> 77 yo male per EMS-coughing up bloody sputum-sat 88%

## 2011-05-09 NOTE — H&P (Signed)
PCP:  Modesta Messing, MD  Chief Complaint:  Cough congestion and shortness of breath  HPI: Feels is a 77 year old gentleman with history of CABG, diabetes, hypertension, A. fib on Coumadin reports that he was in his usual state of health until a week ago. His symptoms started with a cold it states a goal in progress to worsening cough associated with thick yellow sputum with streaks of blood. This has been associated with increasing shortness of breath, wheezing and fevers and chills. He denies any sick contacts he denies PND or orthopnea denies swelling in his lower extremities.   Allergies:   Allergies  Allergen Reactions  . Morphine And Related       Past Medical History  Diagnosis Date  . Diabetes mellitus   . Hypertension   . CHF (congestive heart failure)   . Coronary artery disease   .  atrial fibrillation on Coumadin      Past Surgical History  Procedure Date  . Coronary artery bypass graft   . Cholecystectomy     Prior to Admission medications   Medication Sig Start Date End Date Taking? Authorizing Provider  aspirin 81 MG tablet Take 81 mg by mouth daily.     Yes Historical Provider, MD  bisoprolol (ZEBETA) 5 MG tablet Take 10 mg by mouth daily.     Yes Historical Provider, MD  cholecalciferol (VITAMIN D) 1000 UNITS tablet Take 2,000 Units by mouth daily.     Yes Historical Provider, MD  ezetimibe (ZETIA) 10 MG tablet Take 10 mg by mouth daily.     Yes Historical Provider, MD  finasteride (PROSCAR) 5 MG tablet Take 5 mg by mouth daily.     Yes Historical Provider, MD  fish oil-omega-3 fatty acids 1000 MG capsule Take 1 g by mouth daily.     Yes Historical Provider, MD  furosemide (LASIX) 20 MG tablet Take 20 mg by mouth daily.     Yes Historical Provider, MD  gemfibrozil (LOPID) 600 MG tablet Take 600 mg by mouth 2 (two) times daily before a meal.     Yes Historical Provider, MD  glyBURIDE (DIABETA) 5 MG tablet Take 5 mg by mouth QID.     Yes Historical Provider,  MD  guaifenesin (HUMIBID E) 400 MG TABS Take 400 mg by mouth every 4 (four) hours.     Yes Historical Provider, MD  levothyroxine (SYNTHROID, LEVOTHROID) 25 MCG tablet Take 25 mcg by mouth daily.     Yes Historical Provider, MD  Tamsulosin HCl (FLOMAX) 0.4 MG CAPS Take 0.4 mg by mouth daily.     Yes Historical Provider, MD  warfarin (COUMADIN) 5 MG tablet Take 5 mg by mouth daily. Mon, Thurs, Sat    Yes Historical Provider, MD  warfarin (COUMADIN) 7.5 MG tablet Take 7.5 mg by mouth daily. Tues, Wed, Fri, Sun    Yes Historical Provider, MD    Social History: Lives by himself at home is independent in ADLs and still drives. His son lives next door, was a former smoker quit many years ago. Drinks alcohol only occasionally   Family history: History reviewed. No pertinent family history.  Review of Systems:  Constitutional: Denies fever, chills, diaphoresis, appetite change and fatigue.  HEENT: Denies photophobia, eye pain, redness, hearing loss, ear pain, congestion, sore throat, rhinorrhea, sneezing, mouth sores, trouble swallowing, neck pain, neck stiffness and tinnitus.   Respiratory: Denies SOB, DOE, cough, chest tightness,  and wheezing.   Cardiovascular: Denies chest pain, palpitations and leg  swelling.  Gastrointestinal: Denies nausea, vomiting, abdominal pain, diarrhea, constipation, blood in stool and abdominal distention.  Genitourinary: Denies dysuria, urgency, frequency, hematuria, flank pain and difficulty urinating.  Musculoskeletal: Denies myalgias, back pain, joint swelling, arthralgias and gait problem.  Skin: Denies pallor, rash and wound.  Neurological: Denies dizziness, seizures, syncope, weakness, light-headedness, numbness and headaches.  Hematological: Denies adenopathy. Easy bruising, personal or family bleeding history  Psychiatric/Behavioral: Denies suicidal ideation, mood changes, confusion, nervousness, sleep disturbance and agitation   Physical Exam: Blood  pressure 120/57, pulse 95, temperature 99.2 F (37.3 C), temperature source Oral, resp. rate 24, SpO2 95.00%. General exam alert awake oriented x3 in no acute distress HEENT PERRLA oral mucosa moist and pink neck no JVD Lungs bibasal crackles  Abdomen is soft nontender with normal bowel sounds no organomegaly Extremities trace edema no clubbing or cyanosis Neuro moves all extremities no localizing signs  Labs on Admission:  Results for orders placed during the hospital encounter of 05/09/11 (from the past 48 hour(s))  CBC     Status: Abnormal   Collection Time   05/09/11  4:20 AM      Component Value Range Comment   WBC 5.4  4.0 - 10.5 (K/uL)    RBC 3.53 (*) 4.22 - 5.81 (MIL/uL)    Hemoglobin 11.5 (*) 13.0 - 17.0 (g/dL)    HCT 16.1 (*) 09.6 - 52.0 (%)    MCV 100.6 (*) 78.0 - 100.0 (fL)    MCH 32.6  26.0 - 34.0 (pg)    MCHC 32.4  30.0 - 36.0 (g/dL)    RDW 04.5  40.9 - 81.1 (%)    Platelets 75 (*) 150 - 400 (K/uL)   BASIC METABOLIC PANEL     Status: Abnormal   Collection Time   05/09/11  4:20 AM      Component Value Range Comment   Sodium 139  135 - 145 (mEq/L)    Potassium 4.2  3.5 - 5.1 (mEq/L)    Chloride 104  96 - 112 (mEq/L)    CO2 26  19 - 32 (mEq/L)    Glucose, Bld 133 (*) 70 - 99 (mg/dL)    BUN 22  6 - 23 (mg/dL)    Creatinine, Ser 9.14  0.50 - 1.35 (mg/dL)    Calcium 8.9  8.4 - 10.5 (mg/dL)    GFR calc non Af Amer 54 (*) >90 (mL/min)    GFR calc Af Amer 62 (*) >90 (mL/min)   TROPONIN I     Status: Normal   Collection Time   05/09/11  4:20 AM      Component Value Range Comment   Troponin I <0.30  <0.30 (ng/mL)   LACTIC ACID, PLASMA     Status: Normal   Collection Time   05/09/11  4:20 AM      Component Value Range Comment   Lactic Acid, Venous 1.2  0.5 - 2.2 (mmol/L)   PROTIME-INR     Status: Abnormal   Collection Time   05/09/11  4:20 AM      Component Value Range Comment   Prothrombin Time 21.8 (*) 11.6 - 15.2 (seconds)    INR 1.86 (*) 0.00 - 1.49      URINALYSIS, ROUTINE W REFLEX MICROSCOPIC     Status: Abnormal   Collection Time   05/09/11  8:27 AM      Component Value Range Comment   Color, Urine YELLOW  YELLOW     Appearance CLOUDY (*) CLEAR  Specific Gravity, Urine 1.024  1.005 - 1.030     pH 5.5  5.0 - 8.0     Glucose, UA NEGATIVE  NEGATIVE (mg/dL)    Hgb urine dipstick NEGATIVE  NEGATIVE     Bilirubin Urine NEGATIVE  NEGATIVE     Ketones, ur NEGATIVE  NEGATIVE (mg/dL)    Protein, ur NEGATIVE  NEGATIVE (mg/dL)    Urobilinogen, UA 0.2  0.0 - 1.0 (mg/dL)    Nitrite NEGATIVE  NEGATIVE     Leukocytes, UA NEGATIVE  NEGATIVE  MICROSCOPIC NOT DONE ON URINES WITH NEGATIVE PROTEIN, BLOOD, LEUKOCYTES, NITRITE, OR GLUCOSE <1000 mg/dL.    Radiological Exams on Admission: Chest x-ray:.. Streaky bilateral airspace opacities, most prominent at the  midlung zones bilaterally and at the left lung base, raise  suspicion for pneumonia.  2. Mild vascular congestion and mild cardiomegaly again   Assessment/Plan 1.  Community acquired bacterial pneumonia: Admit, start IV Rocephin and azithromycin, check sputum cultures, repeat chest x-ray in a.m., albuterol nebulizers for pulmonary toilet Also check BNP  2.Paroxysmal a-fib rate controlled continue bisoprolol and Coumadin per pharmacy 3. Diabetes mellitus: Sliding scale insulin 4. history of CAD status post CABG 5. mild pulmonary vascular congestion on chest x-ray will check 2-D echocardiogram.  CODE STATUS discussed the patient he wishes to be a DO NOT RESUSCITATE   Time Spent on Admission:  David Davidson 05/09/2011, 3:26 PM

## 2011-05-10 ENCOUNTER — Inpatient Hospital Stay (HOSPITAL_COMMUNITY): Payer: Medicare Other

## 2011-05-10 DIAGNOSIS — I359 Nonrheumatic aortic valve disorder, unspecified: Secondary | ICD-10-CM

## 2011-05-10 LAB — BASIC METABOLIC PANEL
CO2: 28 mEq/L (ref 19–32)
Calcium: 8.8 mg/dL (ref 8.4–10.5)
Chloride: 102 mEq/L (ref 96–112)
Glucose, Bld: 151 mg/dL — ABNORMAL HIGH (ref 70–99)
Potassium: 4.1 mEq/L (ref 3.5–5.1)
Sodium: 139 mEq/L (ref 135–145)

## 2011-05-10 LAB — CBC
Hemoglobin: 10.9 g/dL — ABNORMAL LOW (ref 13.0–17.0)
MCH: 32.4 pg (ref 26.0–34.0)
Platelets: 74 10*3/uL — ABNORMAL LOW (ref 150–400)
RBC: 3.36 MIL/uL — ABNORMAL LOW (ref 4.22–5.81)
WBC: 5.7 10*3/uL (ref 4.0–10.5)

## 2011-05-10 LAB — URINE CULTURE
Colony Count: NO GROWTH
Culture  Setup Time: 201211271113
Culture: NO GROWTH

## 2011-05-10 LAB — PROTIME-INR
INR: 2.05 — ABNORMAL HIGH (ref 0.00–1.49)
Prothrombin Time: 23.5 seconds — ABNORMAL HIGH (ref 11.6–15.2)

## 2011-05-10 LAB — GLUCOSE, CAPILLARY: Glucose-Capillary: 88 mg/dL (ref 70–99)

## 2011-05-10 LAB — HEMOGLOBIN A1C: Mean Plasma Glucose: 140 mg/dL — ABNORMAL HIGH (ref <117)

## 2011-05-10 MED ORDER — INSULIN ASPART 100 UNIT/ML ~~LOC~~ SOLN
0.0000 [IU] | Freq: Three times a day (TID) | SUBCUTANEOUS | Status: DC
  Administered 2011-05-11: 1 [IU] via SUBCUTANEOUS
  Administered 2011-05-11: 2 [IU] via SUBCUTANEOUS
  Administered 2011-05-12: 1 [IU] via SUBCUTANEOUS
  Administered 2011-05-12: 2 [IU] via SUBCUTANEOUS
  Administered 2011-05-13: 1 [IU] via SUBCUTANEOUS
  Administered 2011-05-13 (×2): 2 [IU] via SUBCUTANEOUS
  Administered 2011-05-14: 3 [IU] via SUBCUTANEOUS
  Administered 2011-05-15 (×2): 2 [IU] via SUBCUTANEOUS
  Administered 2011-05-15: 1 [IU] via SUBCUTANEOUS
  Administered 2011-05-16 (×2): 3 [IU] via SUBCUTANEOUS
  Administered 2011-05-16: 1 [IU] via SUBCUTANEOUS
  Administered 2011-05-17: 5 [IU] via SUBCUTANEOUS
  Filled 2011-05-10: qty 3

## 2011-05-10 MED ORDER — GLYBURIDE 5 MG PO TABS
5.0000 mg | ORAL_TABLET | Freq: Two times a day (BID) | ORAL | Status: DC
  Administered 2011-05-10 – 2011-05-17 (×14): 5 mg via ORAL
  Filled 2011-05-10 (×15): qty 1

## 2011-05-10 MED ORDER — WARFARIN SODIUM 5 MG PO TABS
5.0000 mg | ORAL_TABLET | Freq: Once | ORAL | Status: AC
  Administered 2011-05-10: 5 mg via ORAL
  Filled 2011-05-10: qty 1

## 2011-05-10 MED ORDER — PIPERACILLIN-TAZOBACTAM 3.375 G IVPB
3.3750 g | Freq: Three times a day (TID) | INTRAVENOUS | Status: DC
  Administered 2011-05-10 – 2011-05-13 (×10): 3.375 g via INTRAVENOUS
  Filled 2011-05-10 (×12): qty 50

## 2011-05-10 MED ORDER — FUROSEMIDE 10 MG/ML IJ SOLN
20.0000 mg | Freq: Two times a day (BID) | INTRAMUSCULAR | Status: DC
  Administered 2011-05-10 – 2011-05-13 (×6): 20 mg via INTRAVENOUS
  Filled 2011-05-10 (×7): qty 2

## 2011-05-10 MED ORDER — GLYBURIDE 5 MG PO TABS: 5.0000 mg | ORAL_TABLET | Freq: Three times a day (TID) | ORAL | Status: DC

## 2011-05-10 MED ORDER — PIPERACILLIN-TAZOBACTAM 3.375 G IVPB: 3.3750 g | Freq: Four times a day (QID) | INTRAVENOUS | Status: DC

## 2011-05-10 NOTE — Progress Notes (Signed)
Micro lab called positive blood cultures in anaerobic bottle gram negative rods and gram positive cocci in pairs and chains.  MD on call notified- Dr. Orvan Falconer.  No new orders at this time.  Will continue to monitor. Newman N[place]

## 2011-05-10 NOTE — Progress Notes (Signed)
UR CHART REVIEWED 

## 2011-05-10 NOTE — Progress Notes (Addendum)
Subjective: Patient seen and examined ,C/O worsening SOB with  Minimal exertion   Objective: Vital signs in last 24 hours: Temp:  [99.1 F (37.3 C)-102.1 F (38.9 C)] 99.4 F (37.4 C) (11/28 0449) Pulse Rate:  [66-107] 88  (11/28 0449) Resp:  [20-36] 20  (11/28 0449) BP: (108-132)/(64-84) 112/64 mmHg (11/28 0449) SpO2:  [90 %-99 %] 96 % (11/28 0829) Weight:  [95.119 kg (209 lb 11.2 oz)] 209 lb 11.2 oz (95.119 kg) (11/27 1640) Weight change:  Last BM Date: 05/10/11  Intake/Output from previous day: 11/27 0701 - 11/28 0700 In: 750 [P.O.:450; IV Piggyback:300] Out: 575 [Urine:575] Total I/O In: 250 [P.O.:250] Out: -    Physical Exam: General: Alert, awake, oriented x3, in no acute distress. Heart: Regular rate and rhythm, without murmurs, rubs, gallops. Lungs: bibasilar rales . Abdomen: Soft, nontender, nondistended, positive bowel sounds. Extremities trace pedal  edema with positive pedal pulses. Neuro: Grossly intact, nonfocal.    Lab Results: Results for orders placed during the hospital encounter of 05/09/11 (from the past 24 hour(s))  CULTURE, SPUTUM-ASSESSMENT     Status: Normal   Collection Time   05/09/11  5:53 PM      Component Value Range   Specimen Description SPUTUM     Special Requests NONE     Sputum evaluation       Value: THIS SPECIMEN IS ACCEPTABLE. RESPIRATORY CULTURE REPORT TO FOLLOW.   Report Status 05/09/2011 FINAL    CULTURE, RESPIRATORY     Status: Normal (Preliminary result)   Collection Time   05/09/11  5:53 PM      Component Value Range   Specimen Description SPUTUM     Special Requests NONE     Gram Stain       Value: ABUNDANT WBC PRESENT, PREDOMINANTLY PMN     MODERATE SQUAMOUS EPITHELIAL CELLS PRESENT     MODERATE GRAM POSITIVE COCCI IN PAIRS     FEW GRAM NEGATIVE RODS   Culture PENDING     Report Status PENDING    CBC     Status: Abnormal   Collection Time   05/10/11  4:11 AM      Component Value Range   WBC 5.7  4.0 - 10.5  (K/uL)   RBC 3.36 (*) 4.22 - 5.81 (MIL/uL)   Hemoglobin 10.9 (*) 13.0 - 17.0 (g/dL)   HCT 56.2 (*) 13.0 - 52.0 (%)   MCV 101.2 (*) 78.0 - 100.0 (fL)   MCH 32.4  26.0 - 34.0 (pg)   MCHC 32.1  30.0 - 36.0 (g/dL)   RDW 86.5  78.4 - 69.6 (%)   Platelets 74 (*) 150 - 400 (K/uL)  BASIC METABOLIC PANEL     Status: Abnormal   Collection Time   05/10/11  4:11 AM      Component Value Range   Sodium 139  135 - 145 (mEq/L)   Potassium 4.1  3.5 - 5.1 (mEq/L)   Chloride 102  96 - 112 (mEq/L)   CO2 28  19 - 32 (mEq/L)   Glucose, Bld 151 (*) 70 - 99 (mg/dL)   BUN 20  6 - 23 (mg/dL)   Creatinine, Ser 2.95  0.50 - 1.35 (mg/dL)   Calcium 8.8  8.4 - 28.4 (mg/dL)   GFR calc non Af Amer 68 (*) >90 (mL/min)   GFR calc Af Amer 78 (*) >90 (mL/min)  PROTIME-INR     Status: Abnormal   Collection Time   05/10/11  4:11 AM  Component Value Range   Prothrombin Time 23.5 (*) 11.6 - 15.2 (seconds)   INR 2.05 (*) 0.00 - 1.49     Studies/Results: Dg Chest 2 View  05/09/2011  *RADIOLOGY REPORT*  Clinical Data: Cough, shortness of breath and fever.  CHEST -   IMPRESSION:  1.  Streaky bilateral airspace opacities, most prominent at the midlung zones bilaterally and at the left lung base, raise suspicion for pneumonia. 2.  Mild vascular congestion and mild cardiomegaly again noted.  Original Report Authenticated By: Tonia Ghent, M.D.    Medications:    . albuterol  2.5 mg Nebulization Q6H  . aspirin  81 mg Oral Daily  . azithromycin  500 mg Intravenous Q24H  . bisoprolol  10 mg Oral Daily  . cefTRIAXone (ROCEPHIN)  IV  1 g Intravenous Q24H  . cholecalciferol  2,000 Units Oral Daily  . ezetimibe  10 mg Oral Daily  . finasteride  5 mg Oral Daily  . gemfibrozil  600 mg Oral BID AC  . glyBURIDE  5 mg Oral QID  . guaiFENesin  600 mg Oral BID  . levothyroxine  25 mcg Oral QAC breakfast  . omega-3 acid ethyl esters  1 g Oral Daily  . Tamsulosin HCl  0.4 mg Oral Daily  . warfarin  5 mg Oral ONCE-1800    . warfarin  7.5 mg Oral ONCE-1800  . DISCONTD: fish oil-omega-3 fatty acids  1 g Oral Daily    acetaminophen, acetaminophen     Assessment/Plan:  1.Hypoxic respiratory failure secondary to #2 and #3 2. Community acquired bacterial pneumonia:  Blood culture grew GPC in pairs and chains and GNR IN 1/2 bottles, sputum cultures grew the same. Will switch Rocephin to zosyn  And continue azithromycin Continue  albuterol nebulizers and mucinex .  3.Acute on chronic CHF  Elevated BNP and CXR finiding  noted  Start lasix 20 mg IV bid  And adjust dose based on response  F/U echo report 4.Paroxysmal a-fib rate controlled continue bisoprolol and Coumadin per pharmacy  5. Diabetes mellitus: Sliding scale insulin , will decrease Glyburide dose to 5 mg bid with meals. 6. history of CAD status post CABG  7.CODE STATUS :DO NOT RESUSCITATE       LOS: 1 day   David Davidson 05/10/2011, 1:29 PM

## 2011-05-10 NOTE — Progress Notes (Signed)
ANTICOAGULATION CONSULT NOTE - Initial Consult  Pharmacy Consult for Coumadin Indication: atrial fibrillation  Allergies  Allergen Reactions  . Morphine And Related     Patient Measurements: Height: 6\' 1"  (185.4 cm) Weight: 209 lb 11.2 oz (95.119 kg) IBW/kg (Calculated) : 79.9    Vital Signs: Temp: 99.4 F (37.4 C) (11/28 0449) Temp src: Oral (11/28 0449) BP: 112/64 mmHg (11/28 0449) Pulse Rate: 88  (11/28 0449)  Labs:  Basename 05/10/11 0411 05/09/11 0420  HGB 10.9* 11.5*  HCT 34.0* 35.5*  PLT 74* 75*  APTT -- --  LABPROT 23.5* 21.8*  INR 2.05* 1.86*  HEPARINUNFRC -- --  CREATININE 0.93 1.10  CKTOTAL -- --  CKMB -- --  TROPONINI -- <0.30   Estimated Creatinine Clearance: 50.1 ml/min (by C-G formula based on Cr of 0.93).  Medical History: Past Medical History  Diagnosis Date  . Myocardial infarct   . Diabetes mellitus   . Hypertension   . CHF (congestive heart failure)   . Coronary artery disease   . Dysrhythmia     Medications:  Prescriptions prior to admission  Medication Sig Dispense Refill  . aspirin 81 MG tablet Take 81 mg by mouth daily.        . bisoprolol (ZEBETA) 5 MG tablet Take 10 mg by mouth daily.        . cholecalciferol (VITAMIN D) 1000 UNITS tablet Take 2,000 Units by mouth daily.        Marland Kitchen ezetimibe (ZETIA) 10 MG tablet Take 10 mg by mouth daily.        . finasteride (PROSCAR) 5 MG tablet Take 5 mg by mouth daily.        . fish oil-omega-3 fatty acids 1000 MG capsule Take 1 g by mouth daily.        . furosemide (LASIX) 20 MG tablet Take 20 mg by mouth daily.        Marland Kitchen gemfibrozil (LOPID) 600 MG tablet Take 600 mg by mouth 2 (two) times daily before a meal.        . glyBURIDE (DIABETA) 5 MG tablet Take 5 mg by mouth QID.       Marland Kitchen guaifenesin (HUMIBID E) 400 MG TABS Take 400 mg by mouth every 4 (four) hours.        Marland Kitchen levothyroxine (SYNTHROID, LEVOTHROID) 25 MCG tablet Take 25 mcg by mouth daily.        . Tamsulosin HCl (FLOMAX) 0.4 MG CAPS  Take 0.4 mg by mouth daily.        Marland Kitchen warfarin (COUMADIN) 5 MG tablet Take 5 mg by mouth daily. Mon, Thurs, Sat       . warfarin (COUMADIN) 7.5 MG tablet Take 7.5 mg by mouth daily. Tues, Wed, Fri, Sun         Assessment:  46 YOM with h/o afib on chronic coumadin PTA   Home dose = 5 mg Mon, Thurs and Sat and 7.5 mg other days of the week.    INR therapeutic today  Noted baseline thrombocytopenia.    No bleeding/complications reported.    Goal of Therapy:  INR 2-3   Plan:   Coumadin 5 mg po x 1 tonight.  Will resume back to home dose if INR stable in AM.   Geoffry Paradise, PharmD.   Pager:  [phone] 12:07 PM

## 2011-05-10 NOTE — Progress Notes (Signed)
*  PRELIMINARY RESULTS* Echocardiogram 2D Echocardiogram has been performed.  Glean Salen [facility] 05/10/2011, 11:40 AM

## 2011-05-11 LAB — CBC
Hemoglobin: 10.5 g/dL — ABNORMAL LOW (ref 13.0–17.0)
MCH: 32.1 pg (ref 26.0–34.0)
MCV: 100.3 fL — ABNORMAL HIGH (ref 78.0–100.0)
Platelets: 72 10*3/uL — ABNORMAL LOW (ref 150–400)
RBC: 3.27 MIL/uL — ABNORMAL LOW (ref 4.22–5.81)
WBC: 4.6 10*3/uL (ref 4.0–10.5)

## 2011-05-11 LAB — BASIC METABOLIC PANEL
CO2: 30 mEq/L (ref 19–32)
Chloride: 102 mEq/L (ref 96–112)
Creatinine, Ser: 1.13 mg/dL (ref 0.50–1.35)
Glucose, Bld: 73 mg/dL (ref 70–99)

## 2011-05-11 LAB — GLUCOSE, CAPILLARY
Glucose-Capillary: 154 mg/dL — ABNORMAL HIGH (ref 70–99)
Glucose-Capillary: 103 mg/dL — ABNORMAL HIGH (ref 70–99)
Glucose-Capillary: 172 mg/dL — ABNORMAL HIGH (ref 70–99)
Glucose-Capillary: 109 mg/dL — ABNORMAL HIGH (ref 70–99)

## 2011-05-11 MED ORDER — ALBUTEROL SULFATE (5 MG/ML) 0.5% IN NEBU
2.5000 mg | INHALATION_SOLUTION | Freq: Four times a day (QID) | RESPIRATORY_TRACT | Status: DC
Start: 2011-05-11 — End: 2011-05-17
  Administered 2011-05-11 – 2011-05-17 (×22): 2.5 mg via RESPIRATORY_TRACT
  Filled 2011-05-11 (×20): qty 0.5

## 2011-05-11 MED ORDER — WARFARIN SODIUM 5 MG PO TABS
5.0000 mg | ORAL_TABLET | Freq: Once | ORAL | Status: AC
  Administered 2011-05-11: 5 mg via ORAL
  Filled 2011-05-11: qty 1

## 2011-05-11 NOTE — Progress Notes (Signed)
The patient came in with congested cough and cold like symptoms as well as wheezing. He is a former smoker, no respiratory history noted. Currently he is in no distress, oxygen saturation of 95% on 4 liters of oxygen. The patient states that he does not wish to be woke up during the night for breathing treatments. He is however still having a congested/productive cough so I will give him a flutter and change his nebulizer treatments to QID.

## 2011-05-11 NOTE — Clinical Documentation Improvement (Signed)
CHF DOCUMENTATION CLARIFICATION QUERY  THIS DOCUMENT IS NOT A PERMANENT PART OF THE MEDICAL RECORD  TO RESPOND TO THE THIS QUERY, FOLLOW THE INSTRUCTIONS BELOW:  1. If needed, update documentation for the patient's encounter via the notes activity.  2. Access this query again and click edit on the [employer].  3. After updating, or not, click F2 to complete all highlighted (required) [place] concerning your review. Select "additional documentation in the medical record" OR "no additional documentation provided".  4. Click Sign note button.  5. The deficiency will fall out of your InBasket *Please let us know if you are not able to complete this workflow by phone or e-mail (listed below).  Please update your documentation within the medical record to reflect your response to this query.                                                                                    05/11/11  Dear Dr.Sosan Abdullah/ or Associates,  In a better effort to capture your patient's severity of illness, reflect appropriate length of stay and utilization of resources, a review of the patient medical record has revealed the following indicators the diagnosis of Heart Failure.    Based on your clinical judgment, please clarify and document in a progress note and/or discharge summary the clinical condition associated with the following supporting information:  In responding to this query please exercise your independent judgment.  The fact that a query is asked, does not imply that any particular answer is desired or expected.  05/10/11 progress note..."3.Acute on chronic CHF'.Marland KitchenMarland KitchenFor accurate Dx specificity & severity, can noted "CHF" be further specified with possible, probable, suspected or likely type if possible. Thank you   Possible Clinical Conditions?  CHF Left Heart Failure Systolic or Diastolic Congestive Heart Failure Systolic & Diastolic Congestive Heart Failure  Chronic Systolic or Diastolic  Congestive Heart Failure Chronic Systolic & Diastolic Congestive Heart Failure Acute Systolic or Diastolic Congestive Heart Failure Acute Systolic & Diastolic Congestive Heart Failure  Acute on Chronic Systolic or Diastolic Congestive Heart Failure Acute on Chronic Systolic & Diastolic  Congestive Heart Failure  Other Condition________________________________________ Cannot Clinically Determine  Supporting Information: Risk Factors: Signs & Symptoms: 05/10/11 Progress note.Marland KitchenMarland Kitchen"Elevated BNP and CXR finding noted. Continue lasix 20 mg IV bid , repeat pro BNP in the a.m...2-D echo showed EF of 55-60%, moderate to severe aortic stenosis"...  Diagnostics: 05/09/11) BNP, POC 0 - 1792.0 Radiology: 05/10/11.Marland KitchenMarland Kitchen"IMPRESSION: 1. Cardiomegaly and mild air space disease suggests mild congestive heart failure. 2. Small bilateral pleural effusions."  Treatment: see note above  Reviewed:  no additional documentation provided:05/12/11 MD notes rev'd x2, no add chf specificity added. ORM  Thank You,  Toribio Harbour, RN, BSN, CCDS Certified Clinical Documentation Specialist: [phone] Pager  Health Information Management [place]

## 2011-05-11 NOTE — Progress Notes (Signed)
ANTICOAGULATION CONSULT NOTE - Follow up  Pharmacy Consult for Coumadin Indication: atrial fibrillation  Allergies  Allergen Reactions  . Morphine And Related     Patient Measurements: Height: 6\' 1"  (185.4 cm) Weight: 207 lb 14.3 oz (94.3 kg) IBW/kg (Calculated) : 79.9   Vital Signs: Temp: 98.2 F (36.8 C) (11/29 0441) Temp src: Oral (11/29 0441) BP: 108/65 mmHg (11/29 0441) Pulse Rate: 89  (11/29 0441)  Labs:  Basename 05/11/11 0430 05/10/11 0411 05/09/11 0420  HGB 10.5* 10.9* --  HCT 32.8* 34.0* 35.5*  PLT 72* 74* 75*  APTT -- -- --  LABPROT 28.3* 23.5* 21.8*  INR 2.60* 2.05* 1.86*  HEPARINUNFRC -- -- --  CREATININE 1.13 0.93 1.10  CKTOTAL -- -- --  CKMB -- -- --  TROPONINI -- -- <0.30   Estimated Creatinine Clearance: 41.2 ml/min (by C-G formula based on Cr of 1.13).  Medical History: Past Medical History  Diagnosis Date  . Myocardial infarct   . Diabetes mellitus   . Hypertension   . CHF (congestive heart failure)   . Coronary artery disease   . Dysrhythmia     Medications:  Prescriptions prior to admission  Medication Sig Dispense Refill  . aspirin 81 MG tablet Take 81 mg by mouth daily.        . bisoprolol (ZEBETA) 5 MG tablet Take 10 mg by mouth daily.        . cholecalciferol (VITAMIN D) 1000 UNITS tablet Take 2,000 Units by mouth daily.        Marland Kitchen ezetimibe (ZETIA) 10 MG tablet Take 10 mg by mouth daily.        . finasteride (PROSCAR) 5 MG tablet Take 5 mg by mouth daily.        . fish oil-omega-3 fatty acids 1000 MG capsule Take 1 g by mouth daily.        . furosemide (LASIX) 20 MG tablet Take 20 mg by mouth daily.        Marland Kitchen gemfibrozil (LOPID) 600 MG tablet Take 600 mg by mouth 2 (two) times daily before a meal.        . glyBURIDE (DIABETA) 5 MG tablet Take 5 mg by mouth QID.       Marland Kitchen guaifenesin (HUMIBID E) 400 MG TABS Take 400 mg by mouth every 4 (four) hours.        Marland Kitchen levothyroxine (SYNTHROID, LEVOTHROID) 25 MCG tablet Take 25 mcg by mouth  daily.        . Tamsulosin HCl (FLOMAX) 0.4 MG CAPS Take 0.4 mg by mouth daily.        Marland Kitchen warfarin (COUMADIN) 5 MG tablet Take 5 mg by mouth daily. Mon, Thurs, Sat      . warfarin (COUMADIN) 7.5 MG tablet Take 7.5 mg by mouth daily. Tues, Wed, Fri, Sun         Assessment:  49 YOM with h/o afib on chronic coumadin PTA   Home dose = 5 mg Mon, Thurs and Sat and 7.5 mg other days of the week.    INR therapeutic today  Noted baseline thrombocytopenia.    No bleeding/complications reported.    Goal of Therapy:  INR 2-3   Plan:   Will attempt resuming home Coumadin regimen  Coumadin 5 mg po x 1 tonight.  Follow up PT/INR in AM   Geoffry Paradise, PharmD.   Pager:  [phone] 1:27 PM

## 2011-05-11 NOTE — Progress Notes (Signed)
Subjective: Patient seen and examined , still complaining of cough productive of whitish sputum is decreasing amount. He denies any fever or chills.  Objective: Vital signs in last 24 hours: Temp:  [98.2 F (36.8 C)-98.7 F (37.1 C)] 98.2 F (36.8 C) (11/29 0441) Pulse Rate:  [75-89] 89  (11/29 0441) Resp:  [20-24] 20  (11/29 0441) BP: (107-109)/(59-65) 108/65 mmHg (11/29 0441) SpO2:  [94 %-96 %] 94 % (11/29 0441) Weight:  [94.3 kg (207 lb 14.3 oz)] 207 lb 14.3 oz (94.3 kg) (11/29 0441) Weight change: -0.819 kg (-1 lb 12.9 oz) Last BM Date: 05/10/11  Intake/Output from previous day: 11/28 0701 - 11/29 0700 In: 820 [P.O.:470; IV Piggyback:350] Out: 776 [Urine:775; Stool:1] Total I/O In: -  Out: 150 [Urine:150]   Physical Exam:  General: Alert, awake, oriented x3, in no acute distress.  Heart: Regular rate and rhythm, without murmurs, rubs, gallops.  Lungs: bibasilar rales , diffuse wheezing.  Abdomen: Soft, nontender, nondistended, positive bowel sounds.  Extremities trace pedal edema with positive pedal pulses.  Neuro: Grossly intact, nonfocal.    Lab Results: Results for orders placed during the hospital encounter of 05/09/11 (from the past 24 hour(s))  GLUCOSE, CAPILLARY     Status: Normal   Collection Time   05/10/11  4:58 PM      Component Value Range   Glucose-Capillary 88  70 - 99 (mg/dL)   Comment 1 Documented in Chart     Comment 2 Notify RN    PROTIME-INR     Status: Abnormal   Collection Time   05/11/11  4:30 AM      Component Value Range   Prothrombin Time 28.3 (*) 11.6 - 15.2 (seconds)   INR 2.60 (*) 0.00 - 1.49   CBC     Status: Abnormal   Collection Time   05/11/11  4:30 AM      Component Value Range   WBC 4.6  4.0 - 10.5 (K/uL)   RBC 3.27 (*) 4.22 - 5.81 (MIL/uL)   Hemoglobin 10.5 (*) 13.0 - 17.0 (g/dL)   HCT 16.1 (*) 09.6 - 52.0 (%)   MCV 100.3 (*) 78.0 - 100.0 (fL)   MCH 32.1  26.0 - 34.0 (pg)   MCHC 32.0  30.0 - 36.0 (g/dL)   RDW 04.5   40.9 - 81.1 (%)   Platelets 72 (*) 150 - 400 (K/uL)  BASIC METABOLIC PANEL     Status: Abnormal   Collection Time   05/11/11  4:30 AM      Component Value Range   Sodium 140  135 - 145 (mEq/L)   Potassium 3.6  3.5 - 5.1 (mEq/L)   Chloride 102  96 - 112 (mEq/L)   CO2 30  19 - 32 (mEq/L)   Glucose, Bld 73  70 - 99 (mg/dL)   BUN 24 (*) 6 - 23 (mg/dL)   Creatinine, Ser 9.14  0.50 - 1.35 (mg/dL)   Calcium 8.4  8.4 - 78.2 (mg/dL)   GFR calc non Af Amer 52 (*) >90 (mL/min)   GFR calc Af Amer 60 (*) >90 (mL/min)  GLUCOSE, CAPILLARY     Status: Abnormal   Collection Time   05/11/11  7:10 AM      Component Value Range   Glucose-Capillary 103 (*) 70 - 99 (mg/dL)    Studies/Results: Dg Chest 2 View  05/10/2011  *RADIOLOGY REPORT*  Clinical Data: Pneumonia, congestive heart failure  CHEST - 2 VIEW  Comparison: Chest radiograph 05/09/2011  Findings: Sternotomy wires overlie stable enlarged heart silhouette.  There is mild air space disease at the lung bases. There are small bilateral pleural effusions.  No pneumothorax.  No focal consolidation.  No acute osseous abnormality.  IMPRESSION:  1.  Cardiomegaly and mild air space disease suggests mild congestive heart failure. 2.  Small bilateral pleural effusions.  Original Report Authenticated By: Genevive Bi, M.D.    Medications:    . albuterol  2.5 mg Nebulization QID  . aspirin  81 mg Oral Daily  . azithromycin  500 mg Intravenous Q24H  . bisoprolol  10 mg Oral Daily  . cholecalciferol  2,000 Units Oral Daily  . ezetimibe  10 mg Oral Daily  . finasteride  5 mg Oral Daily  . furosemide  20 mg Intravenous BID  . gemfibrozil  600 mg Oral BID AC  . glyBURIDE  5 mg Oral BID WC  . guaiFENesin  600 mg Oral BID  . insulin aspart  0-9 Units Subcutaneous TID WC  . levothyroxine  25 mcg Oral QAC breakfast  . omega-3 acid ethyl esters  1 g Oral Daily  . piperacillin-tazobactam (ZOSYN)  IV  3.375 g Intravenous Q8H  . Tamsulosin HCl  0.4 mg  Oral Daily  . warfarin  5 mg Oral ONCE-1800  . DISCONTD: albuterol  2.5 mg Nebulization Q6H  . DISCONTD: cefTRIAXone (ROCEPHIN)  IV  1 g Intravenous Q24H  . DISCONTD: glyBURIDE  5 mg Oral QID  . DISCONTD: glyBURIDE  5 mg Oral TID WC  . DISCONTD: piperacillin-tazobactam (ZOSYN)  IV  3.375 g Intravenous Q6H    acetaminophen, acetaminophen     Assessment/Plan: 1.Hypoxic respiratory failure secondary to #2 and #3  2. Community acquired bacterial pneumonia:  Blood culture grew GPC in pairs and chains and GNR IN 1/2 bottles, sputum cultures grew the same. Identification and sensitivity still pending. Continue zosyn and azithromycin  Continue albuterol nebulizers and mucinex .  3.Acute on chronic CHF  Elevated BNP and CXR finiding noted  Continue lasix 20 mg IV bid , repeat pro BNP in the a.m. 2-D echo showed EF of 55-60%, moderate to severe aortic stenosis. With his age and comorbidities he is probably a poor surgical candidate. Would consider cardiology consult if symptoms worsen. 4.Paroxysmal a-fib rate controlled continue bisoprolol and Coumadin per pharmacy . INR is therapeutic 5. Diabetes mellitus: Controlled, continue Sliding scale insulin ,Glyburide dose was decreased to 5 mg bid with meals.  6. history of CAD status post CABG  7.CODE STATUS :DO NOT RESUSCITATE      LOS: 2 days   Emersen Carroll 05/11/2011, 8:41 AM

## 2011-05-12 LAB — CULTURE, RESPIRATORY W GRAM STAIN: Culture: NORMAL

## 2011-05-12 LAB — CULTURE, BLOOD (ROUTINE X 2): Culture  Setup Time: 201211270839

## 2011-05-12 LAB — BASIC METABOLIC PANEL
CO2: 29 mEq/L (ref 19–32)
Calcium: 8.1 mg/dL — ABNORMAL LOW (ref 8.4–10.5)
Creatinine, Ser: 1.1 mg/dL (ref 0.50–1.35)
GFR calc Af Amer: 62 mL/min — ABNORMAL LOW (ref >90)
GFR calc non Af Amer: 54 mL/min — ABNORMAL LOW (ref >90)
Sodium: 138 mEq/L (ref 135–145)

## 2011-05-12 LAB — GLUCOSE, CAPILLARY
Glucose-Capillary: 148 mg/dL — ABNORMAL HIGH (ref 70–99)
Glucose-Capillary: 123 mg/dL — ABNORMAL HIGH (ref 70–99)

## 2011-05-12 LAB — PROTIME-INR
INR: 3.15 — ABNORMAL HIGH (ref 0.00–1.49)
Prothrombin Time: 32.8 seconds — ABNORMAL HIGH (ref 11.6–15.2)

## 2011-05-12 MED ORDER — POTASSIUM CHLORIDE CRYS ER 20 MEQ PO TBCR
40.0000 meq | EXTENDED_RELEASE_TABLET | ORAL | Status: AC
  Administered 2011-05-12 (×2): 40 meq via ORAL
  Filled 2011-05-12 (×2): qty 2

## 2011-05-12 NOTE — Progress Notes (Addendum)
Subjective: Patient seen and examined ,feeling better today ,cough and SOB improving .He also reported intermittent hallucination which now resolved .He is currently alert and oriented x3.   Objective: Vital signs in last 24 hours: Temp:  [97.6 F (36.4 C)-98.1 F (36.7 C)] 97.6 F (36.4 C) (11/30 0510) Pulse Rate:  [91-98] 91  (11/30 0510) Resp:  [18-20] 20  (11/30 0510) BP: (113-115)/(65-74) 115/74 mmHg (11/30 0510) SpO2:  [90 %-98 %] 90 % (11/30 1200) Weight:  [92.5 kg (203 lb 14.8 oz)] 203 lb 14.8 oz (92.5 kg) (11/30 0510) Weight change: -1.8 kg (-3 lb 15.5 oz) Last BM Date: 05/11/11  Intake/Output from previous day: 11/29 0701 - 11/30 0700 In: 830 [P.O.:480; IV Piggyback:350] Out: 1851 [Urine:1850; Stool:1] Total I/O In: 240 [P.O.:240] Out: 300 [Urine:300]   Physical Exam: General: Alert, awake, oriented x3, in no acute distress. Heart: Regular rate and rhythm, systolic  Murmur at the aortic area ,no  rubs, gallops. Lungs: bibasilar rales ,scattered wheezes. Abdomen: Soft, nontender, nondistended, positive bowel sounds. Extremities: No clubbing cyanosis or edema with positive pedal pulses.     Lab Results: Results for orders placed during the hospital encounter of 05/09/11 (from the past 24 hour(s))  GLUCOSE, CAPILLARY     Status: Abnormal   Collection Time   05/11/11  5:39 PM      Component Value Range   Glucose-Capillary 172 (*) 70 - 99 (mg/dL)  GLUCOSE, CAPILLARY     Status: Abnormal   Collection Time   05/11/11  9:11 PM      Component Value Range   Glucose-Capillary 109 (*) 70 - 99 (mg/dL)  PROTIME-INR     Status: Abnormal   Collection Time   05/12/11  4:01 AM      Component Value Range   Prothrombin Time 32.8 (*) 11.6 - 15.2 (seconds)   INR 3.15 (*) 0.00 - 1.49   BASIC METABOLIC PANEL     Status: Abnormal   Collection Time   05/12/11  4:01 AM      Component Value Range   Sodium 138  135 - 145 (mEq/L)   Potassium 3.3 (*) 3.5 - 5.1 (mEq/L)   Chloride 101  96 - 112 (mEq/L)   CO2 29  19 - 32 (mEq/L)   Glucose, Bld 104 (*) 70 - 99 (mg/dL)   BUN 24 (*) 6 - 23 (mg/dL)   Creatinine, Ser 2.13  0.50 - 1.35 (mg/dL)   Calcium 8.1 (*) 8.4 - 10.5 (mg/dL)   GFR calc non Af Amer 54 (*) >90 (mL/min)   GFR calc Af Amer 62 (*) >90 (mL/min)  GLUCOSE, CAPILLARY     Status: Abnormal   Collection Time   05/12/11  8:08 AM      Component Value Range   Glucose-Capillary 118 (*) 70 - 99 (mg/dL)  GLUCOSE, CAPILLARY     Status: Abnormal   Collection Time   05/12/11 12:09 PM      Component Value Range   Glucose-Capillary 164 (*) 70 - 99 (mg/dL)    Studies/Results: No results found.  Medications:    . albuterol  2.5 mg Nebulization QID  . aspirin  81 mg Oral Daily  . azithromycin  500 mg Intravenous Q24H  . bisoprolol  10 mg Oral Daily  . cholecalciferol  2,000 Units Oral Daily  . ezetimibe  10 mg Oral Daily  . finasteride  5 mg Oral Daily  . furosemide  20 mg Intravenous BID  . gemfibrozil  600 mg  Oral BID AC  . glyBURIDE  5 mg Oral BID WC  . guaiFENesin  600 mg Oral BID  . insulin aspart  0-9 Units Subcutaneous TID WC  . levothyroxine  25 mcg Oral QAC breakfast  . omega-3 acid ethyl esters  1 g Oral Daily  . piperacillin-tazobactam (ZOSYN)  IV  3.375 g Intravenous Q8H  . Tamsulosin HCl  0.4 mg Oral Daily  . warfarin  5 mg Oral ONCE-1800    acetaminophen, acetaminophen     Assessment/Plan:  1.Hypoxic respiratory failure secondary to #2 and #3  improving 2. Community acquired bacterial pneumonia:  Blood culture grew Klebs  and strept  In 1/2 bottles, afebrile ,no elevated WBC .Discussed with Dr Daiva Eves who felt it is probably contaminant. Continue zosyn and azithromycin  Continue albuterol nebulizers and mucinex .  3.Acute on chronic CHF  Elevated BNP and CXR finiding noted  Continue lasix 20 mg IV bid .replet k. 2-D echo showed EF of 55-60%, moderate to severe aortic stenosis which is chronic as per patient . With his age  and comorbidities he is  a poor surgical candidate.  4.Paroxysmal a-fib rate controlled continue bisoprolol and Coumadin per pharmacy . INR is supra therapeutic  5. Diabetes mellitus: Controlled, continue Sliding scale insulin  And Glyburide  6.Delerium: probably secondary to PNA ,meds reviewed .continue to monitor  7. history of CAD status post CABG  8.CODE STATUS :DO NOT RESUSCITATE   9.PT eval pending   LOS: 3 days   Quill Grinder 05/12/2011, 1:24 PM

## 2011-05-12 NOTE — Progress Notes (Signed)
ANTICOAGULATION CONSULT NOTE - Follow up  Pharmacy Consult for Coumadin Indication: atrial fibrillation  Allergies  Allergen Reactions  . Morphine And Related     Patient Measurements: Height: 6\' 1"  (185.4 cm) Weight: 203 lb 14.8 oz (92.5 kg) IBW/kg (Calculated) : 79.9   Vital Signs: Temp: 97.6 F (36.4 C) (11/30 0510) Temp src: Oral (11/30 0510) BP: 115/74 mmHg (11/30 0510) Pulse Rate: 91  (11/30 0510)  Labs:  Basename 05/12/11 0401 05/11/11 0430 05/10/11 0411  HGB -- 10.5* 10.9*  HCT -- 32.8* 34.0*  PLT -- 72* 74*  APTT -- -- --  LABPROT 32.8* 28.3* 23.5*  INR 3.15* 2.60* 2.05*  HEPARINUNFRC -- -- --  CREATININE 1.10 1.13 0.93  CKTOTAL -- -- --  CKMB -- -- --  TROPONINI -- -- --   Estimated Creatinine Clearance: 42.4 ml/min (by C-G formula based on Cr of 1.1).  Medical History: Past Medical History  Diagnosis Date  . Myocardial infarct   . Diabetes mellitus   . Hypertension   . CHF (congestive heart failure)   . Coronary artery disease   . Dysrhythmia     Medications:  Prescriptions prior to admission  Medication Sig Dispense Refill  . aspirin 81 MG tablet Take 81 mg by mouth daily.        . bisoprolol (ZEBETA) 5 MG tablet Take 10 mg by mouth daily.        . cholecalciferol (VITAMIN D) 1000 UNITS tablet Take 2,000 Units by mouth daily.        Marland Kitchen ezetimibe (ZETIA) 10 MG tablet Take 10 mg by mouth daily.        . finasteride (PROSCAR) 5 MG tablet Take 5 mg by mouth daily.        . fish oil-omega-3 fatty acids 1000 MG capsule Take 1 g by mouth daily.        . furosemide (LASIX) 20 MG tablet Take 20 mg by mouth daily.        Marland Kitchen gemfibrozil (LOPID) 600 MG tablet Take 600 mg by mouth 2 (two) times daily before a meal.        . glyBURIDE (DIABETA) 5 MG tablet Take 5 mg by mouth QID.       Marland Kitchen guaifenesin (HUMIBID E) 400 MG TABS Take 400 mg by mouth every 4 (four) hours.        Marland Kitchen levothyroxine (SYNTHROID, LEVOTHROID) 25 MCG tablet Take 25 mcg by mouth daily.         . Tamsulosin HCl (FLOMAX) 0.4 MG CAPS Take 0.4 mg by mouth daily.        Marland Kitchen warfarin (COUMADIN) 5 MG tablet Take 5 mg by mouth daily. Mon, Thurs, Sat      . warfarin (COUMADIN) 7.5 MG tablet Take 7.5 mg by mouth daily. Tues, Wed, Fri, Sun         Assessment:  67 YOM with h/o afib on chronic coumadin PTA   Home dose = 5 mg Mon, Thurs and Sat and 7.5 mg other days of the week.    INR higher than goal this AM  Noted baseline thrombocytopenia.    No bleeding/complications reported.    Goal of Therapy:  INR 2-3   Plan:   No coumadin tonight  Follow up PT/INR in AM  Geoffry Paradise, PharmD.   Pager:  [phone] 10:55 AM

## 2011-05-12 NOTE — Progress Notes (Signed)
Physical Therapy Evaluation Patient Details Name: David Davidson MRN: [id number] DOB: 03-10-1911 Today's Date: 05/12/2011 Time:1405-1426  Eval II Problem List:  Patient Active Problem List  Diagnoses  . CAD (coronary artery disease)  . Paroxysmal a-fib  . Diabetes mellitus  . Hypertension  . S/P CABG (coronary artery bypass graft)  . Hyperlipidemia  . Community acquired bacterial pneumonia    Past Medical History:  Past Medical History  Diagnosis Date  . Myocardial infarct   . Diabetes mellitus   . Hypertension   . CHF (congestive heart failure)   . Coronary artery disease   . Dysrhythmia    Past Surgical History:  Past Surgical History  Procedure Date  . Coronary artery bypass graft   . Cholecystectomy     PT Assessment/Plan/Recommendation PT Assessment Clinical Impression Statement: Pt presents with diagnosis of P Afib, Pna. Pt will benefit from skilled PT in the acute care setting to improve general strength, activity tolerance, and gait in order to maximize independence with functional mobillity. PT Recommendation/Assessment: Patient will need skilled PT in the acute care venue PT Problem List: Decreased strength;Decreased activity tolerance;Decreased mobility;Decreased knowledge of use of DME;Cardiopulmonary status limiting activity Barriers to Discharge: Decreased caregiver support PT Therapy Diagnosis : Difficulty walking;Generalized weakness PT Plan PT Frequency: Min 3X/week PT Treatment/Interventions: DME instruction;Gait training;Functional mobility training;Patient/family education PT Recommendation Recommendations for Other Services: OT consult Follow Up Recommendations: Skilled nursing facility (unless family can provide  assist initially-then HHPT) Equipment Recommended: Rolling walker with 5" wheels PT Goals  Acute Rehab PT Goals PT Goal Formulation: With patient Time For Goal Achievement: 7 days Pt will go Supine/Side to Sit: with modified  independence Pt will go Sit to Supine/Side: with modified independence Pt will Ambulate: 51 - 150 feet;with least restrictive assistive device;with supervision Pt will Go Up / Down Stairs: 1-2 stairs;with supervision;with rail(s)  PT Evaluation Precautions/Restrictions  Precautions Precautions: Fall Prior Functioning  Home Living Lives With: Alone Receives Help From: Other (Comment) (son lives next door) Type of Home: House Home Adaptive Equipment: Straight cane Prior Function Level of Independence: Independent with basic ADLs;Independent with homemaking with ambulation;Requires assistive device for independence (cane needed for community ambulation) Driving: Yes Cognition Cognition Arousal/Alertness: Awake/alert Overall Cognitive Status: Appears within functional limits for tasks assessed Orientation Level: Oriented to person;Oriented to place;Oriented to time Sensation/Coordination Coordination Gross Motor Movements are Fluid and Coordinated: Yes Extremity Assessment RLE Assessment RLE Assessment: Not tested (Strength > or = 4/5) LLE Assessment LLE Assessment: Not tested (Strength > or = 4/5) Mobility (including Balance) Bed Mobility Bed Mobility: Yes Supine to Sit: HOB elevated (Comment degrees);With rails;5: Supervision Supine to Sit Details (indicate cue type and reason): Increased time. VCs safety Transfers Transfers: Yes Sit to Stand: 4: Min assist;From bed;From elevated surface;With upper extremity assist Sit to Stand Details (indicate cue type and reason): VCs safety, technique. Assist to rise, stabilize.  Stand to Sit: 4: Min assist;To chair/3-in-1;Without upper extremity assist;With armrests Stand to Sit Details: VCs safety, technique, hand placement. Assist to control descent Stand Pivot Transfers: 4: Min assist Stand Pivot Transfer Details (indicate cue type and reason): with RW. Assist to steady. Ambulation/Gait Ambulation/Gait: Yes Ambulation/Gait  Assistance: 4: Min assist Ambulation/Gait Assistance Details (indicate cue type and reason): VCs safety/safe use of RW. Slow gait speed. Assist to steady. Used O2- unable to locate dynamap to assess sats with activity. Ambulation Distance (Feet): 40 Feet Assistive device: Rolling walker Gait Pattern: Decreased step length - left;Decreased step length - right  Posture/Postural Control Posture/Postural Control: No significant limitations Exercise    End of Session PT - End of Session Equipment Utilized During Treatment: Gait belt (O2) Activity Tolerance: Patient limited by fatigue Patient left: in chair;with call bell in reach Nurse Communication: Mobility status for transfers;Mobility status for ambulation General Behavior During Session: [facility] for tasks performed Cognition: [facility] for tasks performed  Rebeca Alert [facility] 05/12/2011, 2:50 PM

## 2011-05-13 LAB — BASIC METABOLIC PANEL
BUN: 22 mg/dL (ref 6–23)
CO2: 30 mEq/L (ref 19–32)
Chloride: 102 mEq/L (ref 96–112)
Glucose, Bld: 108 mg/dL — ABNORMAL HIGH (ref 70–99)
Potassium: 3.9 mEq/L (ref 3.5–5.1)
Sodium: 139 mEq/L (ref 135–145)

## 2011-05-13 LAB — GLUCOSE, CAPILLARY
Glucose-Capillary: 123 mg/dL — ABNORMAL HIGH (ref 70–99)
Glucose-Capillary: 167 mg/dL — ABNORMAL HIGH (ref 70–99)

## 2011-05-13 MED ORDER — MOXIFLOXACIN HCL 400 MG PO TABS
400.0000 mg | ORAL_TABLET | Freq: Every day | ORAL | Status: DC
  Administered 2011-05-13 – 2011-05-16 (×4): 400 mg via ORAL
  Filled 2011-05-13 (×5): qty 1

## 2011-05-13 MED ORDER — FUROSEMIDE 20 MG PO TABS
20.0000 mg | ORAL_TABLET | Freq: Two times a day (BID) | ORAL | Status: DC
  Administered 2011-05-13 – 2011-05-17 (×8): 20 mg via ORAL
  Filled 2011-05-13 (×9): qty 1

## 2011-05-13 NOTE — Progress Notes (Signed)
Patient was very confused around the 9pm hour, stating that he was going to fall off the ledge he was on. Patient was in bed. Efforts were made to reorient the patient but he could not be reoriented or consoled. Confusion lasted for about half an hour. Patient suddenly became oriented to place and time and self and is now resting. Will cont. to monitor patient.

## 2011-05-13 NOTE — Progress Notes (Signed)
CRITICAL VALUE ALERT  Critical value received:  CO2 43 was called in by lab, RN reports Information to M. Lynch, no order received.  Date of notification:  12.1.12  Time of notification:  0640  Critical value read back:yes   Nurse who received alert:  Georgianne Fick   MD notified (1st page):  M. Lynch  Time of first page:  [phone]  MD notified (2nd page):  Time of second page:  Responding MD:  M. Burnadette Peter  Time MD responded:  [phone]

## 2011-05-13 NOTE — Progress Notes (Signed)
Patient had critical lab of CO2 43, M. Lynch made aware.  Patient coughs at night several times.

## 2011-05-13 NOTE — Progress Notes (Signed)
ANTICOAGULATION CONSULT NOTE - Follow up  Pharmacy Consult for Coumadin Indication: atrial fibrillation  Allergies  Allergen Reactions  . Morphine And Related     Patient Measurements: Height: 6\' 1"  (185.4 cm) Weight: 201 lb 14.4 oz (91.581 kg) IBW/kg (Calculated) : 79.9   Vital Signs: Temp: 98.5 F (36.9 C) (12/01 0505) Temp src: Oral (12/01 0505) BP: 108/63 mmHg (12/01 0505) Pulse Rate: 91  (12/01 0505)  Labs:  Basename 05/13/11 0440 05/12/11 0401 05/11/11 0430  HGB -- -- 10.5*  HCT -- -- 32.8*  PLT -- -- 72*  APTT -- -- --  LABPROT 32.3* 32.8* 28.3*  INR 3.08* 3.15* 2.60*  HEPARINUNFRC -- -- --  CREATININE 1.15 1.10 1.13  CKTOTAL -- -- --  CKMB -- -- --  TROPONINI -- -- --   Estimated Creatinine Clearance: 40.5 ml/min (by C-G formula based on Cr of 1.15).  Medical History: Past Medical History  Diagnosis Date  . Myocardial infarct   . Diabetes mellitus   . Hypertension   . CHF (congestive heart failure)   . Coronary artery disease   . Dysrhythmia     Medications:  Prescriptions prior to admission  Medication Sig Dispense Refill  . aspirin 81 MG tablet Take 81 mg by mouth daily.        . bisoprolol (ZEBETA) 5 MG tablet Take 10 mg by mouth daily.        . cholecalciferol (VITAMIN D) 1000 UNITS tablet Take 2,000 Units by mouth daily.        Marland Kitchen ezetimibe (ZETIA) 10 MG tablet Take 10 mg by mouth daily.        . finasteride (PROSCAR) 5 MG tablet Take 5 mg by mouth daily.        . fish oil-omega-3 fatty acids 1000 MG capsule Take 1 g by mouth daily.        . furosemide (LASIX) 20 MG tablet Take 20 mg by mouth daily.        Marland Kitchen gemfibrozil (LOPID) 600 MG tablet Take 600 mg by mouth 2 (two) times daily before a meal.        . glyBURIDE (DIABETA) 5 MG tablet Take 5 mg by mouth QID.       Marland Kitchen guaifenesin (HUMIBID E) 400 MG TABS Take 400 mg by mouth every 4 (four) hours.        Marland Kitchen levothyroxine (SYNTHROID, LEVOTHROID) 25 MCG tablet Take 25 mcg by mouth daily.        .  Tamsulosin HCl (FLOMAX) 0.4 MG CAPS Take 0.4 mg by mouth daily.        Marland Kitchen warfarin (COUMADIN) 5 MG tablet Take 5 mg by mouth daily. Mon, Thurs, Sat      . warfarin (COUMADIN) 7.5 MG tablet Take 7.5 mg by mouth daily. Tues, Wed, Fri, Sun         Assessment:  59 YOM with h/o afib on chronic coumadin PTA   Home dose = 5 mg Mon, Thurs and Sat and 7.5 mg Tue, Wed, Fri, and Sun.    INR remains above goal 2-3, but trending down  Noted baseline thrombocytopenia.    No bleeding/complications reported.    Goal of Therapy:  INR 2-3   Plan:   No coumadin tonight  Follow up PT/INR in AM  Plan to resume coumadin dosing when Daleen Bo, PharmD 05/13/2011 2:10 PM

## 2011-05-13 NOTE — Progress Notes (Signed)
Subjective: Patient seen and examined ,feeling better,reported improvement in cough and SOB .  Objective: Vital signs in last 24 hours: Temp:  [97.3 F (36.3 C)-98.5 F (36.9 C)] 97.3 F (36.3 C) (12/01 1432) Pulse Rate:  [87-91] 91  (12/01 1432) Resp:  [18-20] 18  (12/01 1432) BP: (97-121)/(60-65) 97/60 mmHg (12/01 1432) SpO2:  [94 %-98 %] 98 % (12/01 1432) Weight:  [91.581 kg (201 lb 14.4 oz)] 201 lb 14.4 oz (91.581 kg) (12/01 0505) Weight change: -0.919 kg (-2 lb 0.4 oz) Last BM Date: 05/12/11  Intake/Output from previous day: 11/30 0701 - 12/01 0700 In: 1240 [P.O.:840; IV Piggyback:400] Out: 950 [Urine:950] Total I/O In: 120 [P.O.:120] Out: 300 [Urine:300]   Physical Exam: General: Alert, awake, oriented x3, in no acute distress.  Heart: Regular rate and rhythm, systolic Murmur at the aortic area ,no rubs, gallops.  Lungs: few bibasilar rales ,scattered wheezes.  Abdomen: Soft, nontender, nondistended, positive bowel sounds.  Extremities: No clubbing cyanosis or edema with positive pedal pulses.     Lab Results: Results for orders placed during the hospital encounter of 05/09/11 (from the past 24 hour(s))  GLUCOSE, CAPILLARY     Status: Abnormal   Collection Time   05/12/11  4:27 PM      Component Value Range   Glucose-Capillary 148 (*) 70 - 99 (mg/dL)  GLUCOSE, CAPILLARY     Status: Abnormal   Collection Time   05/12/11  9:21 PM      Component Value Range   Glucose-Capillary 123 (*) 70 - 99 (mg/dL)   Comment 1 Documented in Chart     Comment 2 Notify RN    PROTIME-INR     Status: Abnormal   Collection Time   05/13/11  4:40 AM      Component Value Range   Prothrombin Time 32.3 (*) 11.6 - 15.2 (seconds)   INR 3.08 (*) 0.00 - 1.49   BASIC METABOLIC PANEL     Status: Abnormal   Collection Time   05/13/11  4:40 AM      Component Value Range   Sodium 139  135 - 145 (mEq/L)   Potassium 3.9  3.5 - 5.1 (mEq/L)   Chloride 102  96 - 112 (mEq/L)   CO2 30  19 - 32  (mEq/L)   Glucose, Bld 108 (*) 70 - 99 (mg/dL)   BUN 22  6 - 23 (mg/dL)   Creatinine, Ser 1.61  0.50 - 1.35 (mg/dL)   Calcium 8.4  8.4 - 09.6 (mg/dL)   GFR calc non Af Amer 51 (*) >90 (mL/min)   GFR calc Af Amer 59 (*) >90 (mL/min)  MAGNESIUM     Status: Normal   Collection Time   05/13/11  4:40 AM      Component Value Range   Magnesium 2.5  1.5 - 2.5 (mg/dL)  GLUCOSE, CAPILLARY     Status: Abnormal   Collection Time   05/13/11  8:01 AM      Component Value Range   Glucose-Capillary 123 (*) 70 - 99 (mg/dL)  GLUCOSE, CAPILLARY     Status: Abnormal   Collection Time   05/13/11 11:55 AM      Component Value Range   Glucose-Capillary 167 (*) 70 - 99 (mg/dL)    Studies/Results: No results found.  Medications:    . albuterol  2.5 mg Nebulization QID  . aspirin  81 mg Oral Daily  . azithromycin  500 mg Intravenous Q24H  . bisoprolol  10 mg  Oral Daily  . cholecalciferol  2,000 Units Oral Daily  . ezetimibe  10 mg Oral Daily  . finasteride  5 mg Oral Daily  . furosemide  20 mg Intravenous BID  . gemfibrozil  600 mg Oral BID AC  . glyBURIDE  5 mg Oral BID WC  . guaiFENesin  600 mg Oral BID  . insulin aspart  0-9 Units Subcutaneous TID WC  . levothyroxine  25 mcg Oral QAC breakfast  . omega-3 acid ethyl esters  1 g Oral Daily  . piperacillin-tazobactam (ZOSYN)  IV  3.375 g Intravenous Q8H  . potassium chloride  40 mEq Oral Q4H  . Tamsulosin HCl  0.4 mg Oral Daily    acetaminophen, acetaminophen     Assessment/Plan:  1.Hypoxic respiratory failure secondary to #2 and #3  improving  2. Community acquired bacterial pneumonia:  Blood culture grew Klebs and strept In 1/2 bottles, afebrile ,no elevated WBC .Discussed with Dr Daiva Eves who felt it is probably contaminant.  Switch antibiotics to po Avelox   Continue albuterol nebulizers and mucinex .  3.Acute on chronic CHF   Continue lasix 20 mg change to po .  2-D echo showed EF of 55-60%, moderate to severe aortic stenosis  which is chronic as per patient . With his age and comorbidities he is a poor surgical candidate.  4.Paroxysmal a-fib rate controlled continue bisoprolol and Coumadin per pharmacy . INR is supra therapeutic  5. Diabetes mellitus: Controlled, continue Sliding scale insulin And Glyburide  6.Delerium: probably secondary to PNA ,meds reviewed .continue to monitor ,improving. 7. history of CAD status post CABG  8.CODE STATUS :DO NOT RESUSCITATE  9.Disposition: PT recommended SNF       LOS: 4 days   David Davidson 05/13/2011, 4:08 PM

## 2011-05-14 ENCOUNTER — Inpatient Hospital Stay (HOSPITAL_COMMUNITY): Payer: Medicare Other

## 2011-05-14 LAB — COMPREHENSIVE METABOLIC PANEL
ALT: 18 U/L (ref 0–53)
Albumin: 3.5 g/dL (ref 3.5–5.2)
Alkaline Phosphatase: 47 U/L (ref 39–117)
BUN: 20 mg/dL (ref 6–23)
Chloride: 103 mEq/L (ref 96–112)
Glucose, Bld: 197 mg/dL — ABNORMAL HIGH (ref 70–99)
Potassium: 4.1 mEq/L (ref 3.5–5.1)
Sodium: 140 mEq/L (ref 135–145)
Total Bilirubin: 0.7 mg/dL (ref 0.3–1.2)
Total Protein: 7.8 g/dL (ref 6.0–8.3)

## 2011-05-14 LAB — BASIC METABOLIC PANEL
BUN: 20 mg/dL (ref 6–23)
Creatinine, Ser: 0.99 mg/dL (ref 0.50–1.35)
GFR calc Af Amer: 76 mL/min — ABNORMAL LOW (ref >90)
GFR calc non Af Amer: 66 mL/min — ABNORMAL LOW (ref >90)
Glucose, Bld: 97 mg/dL (ref 70–99)
Potassium: 3.5 mEq/L (ref 3.5–5.1)

## 2011-05-14 LAB — GLUCOSE, CAPILLARY: Glucose-Capillary: 76 mg/dL (ref 70–99)

## 2011-05-14 LAB — PROTIME-INR: Prothrombin Time: 27.7 seconds — ABNORMAL HIGH (ref 11.6–15.2)

## 2011-05-14 MED ORDER — WARFARIN SODIUM 4 MG PO TABS
4.0000 mg | ORAL_TABLET | Freq: Once | ORAL | Status: AC
  Administered 2011-05-14: 4 mg via ORAL
  Filled 2011-05-14: qty 1

## 2011-05-14 NOTE — Progress Notes (Addendum)
Occupational Therapy Evaluation Patient Details Name: David Davidson MRN: [id number] DOB: 1911/01/31 Today's Date: 05/14/2011  Problem List:  Patient Active Problem List  Diagnoses  . CAD (coronary artery disease)  . Paroxysmal a-fib  . Diabetes mellitus  . Hypertension  . S/P CABG (coronary artery bypass graft)  . Hyperlipidemia  . Community acquired bacterial pneumonia    Past Medical History:  Past Medical History  Diagnosis Date  . Myocardial infarct   . Diabetes mellitus   . Hypertension   . CHF (congestive heart failure)   . Coronary artery disease   . Dysrhythmia    Past Surgical History:  Past Surgical History  Procedure Date  . Coronary artery bypass graft   . Cholecystectomy     OT Assessment/Plan/Recommendation OT Assessment Clinical Impression Statement: decreased activity tolerance,  OT Recommendation/Assessment: Patient will need skilled OT in the acute care venue OT Problem List: Decreased activity tolerance;Impaired balance (sitting and/or standing);Decreased safety awareness;Decreased knowledge of use of DME or AE;Decreased knowledge of precautions OT Therapy Diagnosis : Generalized weakness OT Plan OT Frequency: Min 3X/week OT Treatment/Interventions: Self-care/ADL training;Energy conservation;DME and/or AE instruction;Cognitive remediation/compensation;Therapeutic activities;Balance training;Patient/family education OT Recommendation Follow Up Recommendations: Skilled nursing facility Equipment Recommended: Defer to next venue Individuals Consulted Consulted and Agree with Results and Recommendations: Patient OT Goals Acute Rehab OT Goals OT Goal Formulation: With patient Time For Goal Achievement: 2 weeks ADL Goals Pt Will Perform Grooming: with modified independence;Sitting at sink Pt Will Perform Upper Body Bathing: with set-up;Sit to stand from chair Pt Will Perform Lower Body Bathing: with min assist;Sit to stand from chair (AE PRN) Pt  Will Perform Upper Body Dressing: with set-up;Sit to stand from chair;with adaptive equipment (PRn`) Pt Will Perform Lower Body Dressing: with min assist;Sit to stand from chair;with adaptive equipment (PRN) Pt Will Transfer to Toilet: with min assist;3-in-1;Ambulation;Stand pivot transfer Pt Will Perform Toileting - Clothing Manipulation: with supervision;Sitting on 3-in-1 or toilet Pt Will Perform Toileting - Hygiene: with supervision;Sit to stand from 3-in-1/toilet  OT Evaluation Precautions/Restrictions  Precautions Precautions: Fall Required Braces or Orthoses: No Restrictions Weight Bearing Restrictions: No Prior Functioning Home Living Lives With: Alone Receives Help From: Family;Other (Comment) (son lives next door) Type of Home: House Home Adaptive Equipment: Straight cane Prior Function Level of Independence: Independent with basic ADLs;Independent with homemaking with ambulation;Independent with homemaking with wheelchair;Independent with gait Driving: Yes 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 pt demonstrates deconditioning. Vision/Perception  Vision - History Baseline Vision: Bifocals Patient Visual Report: No change from baseline Vision - Assessment Additional Comments: PT HOH and repeats statements to ensure pt is receiving the correct message Cognition Cognition Arousal/Alertness: Awake/alert Overall Cognitive  Status: Appears within functional limits for tasks assessed Orientation Level: Oriented X4 Cognition - Other Comments: After OT eval to restroom with ambulation pt states "i see now why they say I have to have more rehab somewhere. pt turns to son and says "when can I go and when do we pick?" Pt requesting "[facility] in [place]" Sensation/Coordination Sensation Additional Comments: Pt pleasant and eager to  participate. PT sit<>stand *2 with Mod A bil UE required. Pt with static balance fair.  Coordination Gross Motor Movements are Fluid and Coordinated: Yes Fine Motor Movements are Fluid and Coordinated: Yes Extremity Assessment RUE Assessment RUE Assessment: Within Functional Limits LUE Assessment LUE Assessment: Within Functional Limits Mobility  Bed Mobility Bed Mobility: No Transfers Transfers: Yes Sit to Stand: 3: Mod assist;With upper extremity assist;From chair/3-in-1 Stand to Sit: 3: Mod assist;With upper extremity assist;To chair/3-in-1 (holding onto RW and not reaching back for chair. ) Stand to Sit Details: uncontrolled descend to chair Pt with posterior lean and min A to correct. Pt unsteady gait and relying on Rw for balance. Exercises   End of Session OT - End of Session Equipment Utilized During Treatment: Gait belt Activity Tolerance: Patient tolerated treatment well;Patient limited by fatigue Patient left: in chair;with call bell in reach;with family/visitor present (son David Davidson present) General Behavior During Session: [facility] for tasks performed Cognition: [facility] for tasks performed  Next session: work on activity tolerance with ADL tasks  PT requesting [facility] SNF for d/c planning   Lucile Shutters 05/14/2011, 11:24 AM  Pager: [phone]

## 2011-05-14 NOTE — Progress Notes (Signed)
ANTICOAGULATION CONSULT NOTE - Follow up  Pharmacy Consult for Coumadin Indication: atrial fibrillation  Allergies  Allergen Reactions  . Morphine And Related     Patient Measurements: Height: 6\' 1"  (185.4 cm) Weight: 205 lb 14.6 oz (93.4 kg) IBW/kg (Calculated) : 79.9   Vital Signs: Temp: 97.8 F (36.6 C) (12/02 1000) Temp src: Oral (12/02 1000) BP: 93/57 mmHg (12/02 1000) Pulse Rate: 109  (12/02 1000)  Labs:  Basename 05/14/11 0450 05/13/11 0440 05/12/11 0401  HGB -- -- --  HCT -- -- --  PLT -- -- --  APTT -- -- --  LABPROT 27.7* 32.3* 32.8*  INR 2.53* 3.08* 3.15*  HEPARINUNFRC -- -- --  CREATININE 0.99 1.15 1.10  CKTOTAL -- -- --  CKMB -- -- --  TROPONINI -- -- --   Estimated Creatinine Clearance: 47.1 ml/min (by C-G formula based on Cr of 0.99).  Medical History: Past Medical History  Diagnosis Date  . Myocardial infarct   . Diabetes mellitus   . Hypertension   . CHF (congestive heart failure)   . Coronary artery disease   . Dysrhythmia     Medications:  Prescriptions prior to admission  Medication Sig Dispense Refill  . aspirin 81 MG tablet Take 81 mg by mouth daily.        . bisoprolol (ZEBETA) 5 MG tablet Take 10 mg by mouth daily.        . cholecalciferol (VITAMIN D) 1000 UNITS tablet Take 2,000 Units by mouth daily.        Marland Kitchen ezetimibe (ZETIA) 10 MG tablet Take 10 mg by mouth daily.        . finasteride (PROSCAR) 5 MG tablet Take 5 mg by mouth daily.        . fish oil-omega-3 fatty acids 1000 MG capsule Take 1 g by mouth daily.        . furosemide (LASIX) 20 MG tablet Take 20 mg by mouth daily.        Marland Kitchen gemfibrozil (LOPID) 600 MG tablet Take 600 mg by mouth 2 (two) times daily before a meal.        . glyBURIDE (DIABETA) 5 MG tablet Take 5 mg by mouth QID.       Marland Kitchen guaifenesin (HUMIBID E) 400 MG TABS Take 400 mg by mouth every 4 (four) hours.        Marland Kitchen levothyroxine (SYNTHROID, LEVOTHROID) 25 MCG tablet Take 25 mcg by mouth daily.        .  Tamsulosin HCl (FLOMAX) 0.4 MG CAPS Take 0.4 mg by mouth daily.        Marland Kitchen warfarin (COUMADIN) 5 MG tablet Take 5 mg by mouth daily. Mon, Thurs, Sat      . warfarin (COUMADIN) 7.5 MG tablet Take 7.5 mg by mouth daily. Tues, Wed, Fri, Sun         Assessment:  69 YOM with h/o afib on chronic coumadin PTA   Home dose = 5 mg Mon, Thurs and Sat and 7.5 mg Tue, Wed, Fri, and Sun.    INR now back in goal range 2-3. Will resume warfarin dosing at lower dose than usual home regimen, especially given drug interaction with newly-started Avelox.  Noted baseline thrombocytopenia.    No bleeding/complications reported.    Goal of Therapy:  INR 2-3   Plan:   Warfarin 4mg  PO x1 at 18:00  Follow up PT/INR in AM  Darrol Angel, PharmD 05/14/2011 11:31 AM

## 2011-05-14 NOTE — Progress Notes (Addendum)
Subjective: Patient seen and examined ,feeling better ,want to go home tomorrow .Noted by RN to have an episode of transient confusion last night .currently alert and oriented x 3.  Objective: Vital signs in last 24 hours: Temp:  [97.3 F (36.3 C)-97.8 F (36.6 C)] 97.8 F (36.6 C) (12/02 1000) Pulse Rate:  [82-109] 109  (12/02 1000) Resp:  [18-20] 20  (12/02 1000) BP: (93-118)/(47-60) 93/57 mmHg (12/02 1000) SpO2:  [96 %-99 %] 96 % (12/02 0942) Weight:  [93.4 kg (205 lb 14.6 oz)] 205 lb 14.6 oz (93.4 kg) (12/02 0420) Weight change: 1.819 kg (4 lb 0.2 oz) Last BM Date: 05/13/11  Intake/Output from previous day: 12/01 0701 - 12/02 0700 In: 120 [P.O.:120] Out: 2226 [Urine:2225; Stool:1]     Physical Exam: General: Alert, awake, oriented x3, in no acute distress.  Heart: Regular rate and rhythm, systolic Murmur at the aortic area ,no rubs, gallops.  Lungs: few bibasilar rales .  Abdomen: Soft, nontender, nondistended, positive bowel sounds.  Extremities: No clubbing cyanosis or edema with positive pedal pulses.    Lab Results: Results for orders placed during the hospital encounter of 05/09/11 (from the past 24 hour(s))  GLUCOSE, CAPILLARY     Status: Abnormal   Collection Time   05/13/11 11:55 AM      Component Value Range   Glucose-Capillary 167 (*) 70 - 99 (mg/dL)  GLUCOSE, CAPILLARY     Status: Abnormal   Collection Time   05/13/11  4:44 PM      Component Value Range   Glucose-Capillary 157 (*) 70 - 99 (mg/dL)  GLUCOSE, CAPILLARY     Status: Abnormal   Collection Time   05/13/11 10:05 PM      Component Value Range   Glucose-Capillary 144 (*) 70 - 99 (mg/dL)   Comment 1 Notify RN     Comment 2 Documented in Chart    PROTIME-INR     Status: Abnormal   Collection Time   05/14/11  4:50 AM      Component Value Range   Prothrombin Time 27.7 (*) 11.6 - 15.2 (seconds)   INR 2.53 (*) 0.00 - 1.49   BASIC METABOLIC PANEL     Status: Abnormal   Collection Time   05/14/11   4:50 AM      Component Value Range   Sodium 142  135 - 145 (mEq/L)   Potassium 3.5  3.5 - 5.1 (mEq/L)   Chloride 103  96 - 112 (mEq/L)   CO2 32  19 - 32 (mEq/L)   Glucose, Bld 97  70 - 99 (mg/dL)   BUN 20  6 - 23 (mg/dL)   Creatinine, Ser 1.61  0.50 - 1.35 (mg/dL)   Calcium 8.6  8.4 - 09.6 (mg/dL)   GFR calc non Af Amer 66 (*) >90 (mL/min)   GFR calc Af Amer 76 (*) >90 (mL/min)  GLUCOSE, CAPILLARY     Status: Abnormal   Collection Time   05/14/11  7:22 AM      Component Value Range   Glucose-Capillary 107 (*) 70 - 99 (mg/dL)    Studies/Results: No results found.  Medications:    . albuterol  2.5 mg Nebulization QID  . aspirin  81 mg Oral Daily  . bisoprolol  10 mg Oral Daily  . cholecalciferol  2,000 Units Oral Daily  . ezetimibe  10 mg Oral Daily  . finasteride  5 mg Oral Daily  . furosemide  20 mg Oral BID  .  gemfibrozil  600 mg Oral BID AC  . glyBURIDE  5 mg Oral BID WC  . guaiFENesin  600 mg Oral BID  . insulin aspart  0-9 Units Subcutaneous TID WC  . levothyroxine  25 mcg Oral QAC breakfast  . moxifloxacin  400 mg Oral q1800  . omega-3 acid ethyl esters  1 g Oral Daily  . Tamsulosin HCl  0.4 mg Oral Daily  . DISCONTD: azithromycin  500 mg Intravenous Q24H  . DISCONTD: furosemide  20 mg Intravenous BID  . DISCONTD: piperacillin-tazobactam (ZOSYN)  IV  3.375 g Intravenous Q8H    acetaminophen, acetaminophen     Assessment/Plan: 1.Hypoxic respiratory failure secondary to #2 and #3  Improved,will titrate o2 down as tolerated . 2. Community acquired bacterial pneumonia:  Blood culture grew Klebs and strept In 1/2 bottles, afebrile ,no elevated WBC .Discussed with Dr Daiva Eves who felt it is probably contaminant.  Continue  po Avelox  For 6 more days to complete 10 days of antibiotics. Continue albuterol nebulizers and mucinex .  3.Acute on chronic CHF  Continue lasix 20 mg po .  2-D echo showed EF of 55-60%, moderate to severe aortic stenosis which is chronic as  per patient . With his age and comorbidities he is a poor surgical candidate.  4.Paroxysmal a-fib rate controlled continue bisoprolol and Coumadin per pharmacy . INR is supra therapeutic  5. Diabetes mellitus: Controlled, continue Sliding scale insulin And Glyburide  6.Delerium: probably secondary to PNA ,will order head CT  To R/O neurological reasons which is unlikely ,his neurological exam is unremarkable. Chronic Thrombocytopenia: ? Etiology ,stable ,check LFTs 7. history of CAD status post CABG  8.CODE STATUS :DO NOT RESUSCITATE  9.Disposition:  PT recommended SNF ,patient refusing ,I spoke to his son who will try to convince him.     LOS: 5 days   Janella Rogala 05/14/2011, 11:03 AM

## 2011-05-14 NOTE — Plan of Care (Signed)
Problem: Phase II Progression Outcomes Goal: Progress activity as tolerated unless otherwise ordered Outcome: Progressing Pt tolerated 15 minutes of activity and then required a rest break. Pt fatigued with OT eval. Pt reports "its like a marathon"

## 2011-05-15 LAB — GLUCOSE, CAPILLARY
Glucose-Capillary: 131 mg/dL — ABNORMAL HIGH (ref 70–99)
Glucose-Capillary: 183 mg/dL — ABNORMAL HIGH (ref 70–99)
Glucose-Capillary: 220 mg/dL — ABNORMAL HIGH (ref 70–99)

## 2011-05-15 LAB — PROTIME-INR: Prothrombin Time: 22.5 seconds — ABNORMAL HIGH (ref 11.6–15.2)

## 2011-05-15 LAB — CBC
Hemoglobin: 10.6 g/dL — ABNORMAL LOW (ref 13.0–17.0)
MCH: 31.5 pg (ref 26.0–34.0)
RBC: 3.36 MIL/uL — ABNORMAL LOW (ref 4.22–5.81)

## 2011-05-15 LAB — CULTURE, BLOOD (ROUTINE X 2)

## 2011-05-15 MED ORDER — WARFARIN SODIUM 5 MG PO TABS
5.0000 mg | ORAL_TABLET | Freq: Once | ORAL | Status: AC
  Administered 2011-05-15: 5 mg via ORAL
  Filled 2011-05-15: qty 1

## 2011-05-15 NOTE — Progress Notes (Signed)
Physical Therapy Treatment Patient Details Name: David Davidson MRN: [id number] DOB: [birth year] Today's Date: 05/15/2011 Time: 4098-1191  1G PT Assessment/Plan  PT - Assessment/Plan Comments on Treatment Session: Pt continues to demonstrate decreased general strength and activity tolerance during functional tasks. Pt fatigues fairly easily. Continue to recommend SNF for rehab.  PT Plan: Discharge plan remains appropriate Follow Up Recommendations: Skilled nursing facility Equipment Recommended: Defer to next venue PT Goals  Acute Rehab PT Goals PT Goal: Supine/Side to Sit - Progress: Progressing toward goal PT Goal: Sit to Supine/Side - Progress: Progressing toward goal PT Goal: Ambulate - Progress: Progressing toward goal  PT Treatment Precautions/Restrictions  Precautions Precautions: Fall Precaution Comments: patient with chair alarm and bed alarm Required Braces or Orthoses: No Restrictions Weight Bearing Restrictions: No Mobility (including Balance) Bed Mobility Supine to Sit: HOB elevated (Comment degrees);With rails;5: Supervision Sit to Supine - Right: 5: Supervision;HOB elevated (comment degrees);With rail Transfers Transfers: Yes Sit to Stand: 4: Min assist;From elevated surface;With upper extremity assist;From bed Sit to Stand Details (indicate cue type and reason): VCs safety, technique, hand placement. 2 attempts to get to full upright. Assist to rise and stabilize.  Stand to Sit: To elevated surface;To bed;With upper extremity assist;4: Min assist Stand to Sit Details: VCs safety, technique, hand placement. Assist to control descent.  Ambulation/Gait Ambulation/Gait: Yes Ambulation/Gait Assistance: 4: Min assist Ambulation/Gait Assistance Details (indicate cue type and reason): VCs safety, encouragement. Increased time to complete task. Pt fatigues easily.  Ambulation Distance (Feet): 20 Feet Assistive device: Rolling walker Gait Pattern: Right foot flat;Left  foot flat;Decreased step length - right;Decreased step length - left;Trunk flexed  Posture/Postural Control Posture/Postural Control: No significant limitations Exercise    End of Session PT - End of Session Equipment Utilized During Treatment: Gait belt Activity Tolerance: Patient limited by fatigue Patient left: in bed;with call bell in reach General Behavior During Session: Lethargic Cognition: WFL for tasks performed  David Davidson [facility] 05/15/2011, 2:51 PM

## 2011-05-15 NOTE — Progress Notes (Signed)
Psychosocial assessment completed and placed in shadow chart.   CSW met with patient. Patient is agreeable to need for placement at skilled nursing facility upon d/c. Requesting [facility] nursing home as it is near his home. CSW submitted information to [facility]. FL-2 completed and placed on chart for patient signature. CSW to follow.  Karrisa Didio C. Kahmya Pinkham MSW, LCSW [phone]

## 2011-05-15 NOTE — Progress Notes (Signed)
Occupational Therapy Treatment Patient Details Name: David Davidson MRN: [id number] DOB: [birth year] Today's Date: 05/15/2011 Time in: 8:35 am Time out: 8:55 am 1TA  OT Assessment/Plan OT Assessment/Plan OT Plan: Frequency needs to be updated OT Frequency: Min 1X/week Follow Up Recommendations: Skilled nursing facility Equipment Recommended: Defer to next venue OT Goals ADL Goals ADL Goal: Grooming - Progress: Progressing toward goals ADL Goal: Upper Body Bathing - Progress: Progressing toward goals ADL Goal: Lower Body Bathing - Progress: Progressing toward goals ADL Goal: Upper Body Dressing - Progress: Progressing toward goals ADL Goal: Lower Body Dressing - Progress: Progressing toward goals ADL Goal: Toilet Transfer - Progress: Progressing toward goals ADL Goal: Toileting - Clothing Manipulation - Progress: Progressing toward goals ADL Goal: Toileting - Hygiene - Progress: Progressing toward goals  OT Treatment Precautions/Restrictions  Precautions Precautions: Fall Precaution Comments: patient with chair alarm and bed alarm   ADL ADL Toilet Transfer: Mining engineer Details (indicate cue type and reason): min verbal cues for hand placement including to reach back for recliner Toilet Transfer Method: Stand pivot Acupuncturist: Other (comment) (rolling walker to chair) Toileting - Clothing Manipulation: Simulated;Minimal assistance Toileting - Clothing Manipulation Details (indicate cue type and reason): min assist using bilateral hands to simulate clothing manipulation. Alittle more unsteady than when holding RW with one hand and using opposite hand to simulate clothing manipulation Where Assessed - Toileting Clothing Manipulation: Standing Equipment Used: Rolling walker ADL Comments: Patient up to chair. Chair alarm set. Son present. RN made aware of O2 sats. Mobility  Transfers Sit to Stand: 4: Min assist;From bed;With upper  extremity assist Sit to Stand Details (indicate cue type and reason): with bed partially raise with UE assist Exercises    End of Session OT - End of Session Equipment Utilized During Treatment: Other (comment) (rolling walker) Activity Tolerance: Patient tolerated treatment well Patient left: in chair;with call bell in reach;Other (comment) (chair alarm set) Nurse Communication: Mobility status for transfers General Behavior During Session: [facility] for tasks performed Cognition: [facility] for tasks performed  Lennox Laity  [phone] 05/15/2011, 9:23 AM

## 2011-05-15 NOTE — Progress Notes (Signed)
Subjective: Patient seen and examined, reported  improvement in his breathing and cough today. Objective: Vital signs in last 24 hours: Temp:  [97.6 F (36.4 C)-98 F (36.7 C)] 98 F (36.7 C) (12/03 0451) Pulse Rate:  [83-109] 83  (12/03 0451) Resp:  [12-20] 16  (12/03 0451) BP: (93-121)/(53-68) 121/53 mmHg (12/03 0451) SpO2:  [91 %-97 %] 95 % (12/03 0749) Weight:  [90.8 kg (200 lb 2.8 oz)] 200 lb 2.8 oz (90.8 kg) (12/03 0451) Weight change: -2.6 kg (-5 lb 11.7 oz) Last BM Date: 05/14/11  Intake/Output from previous day: 12/02 0701 - 12/03 0700 In: 240 [P.O.:240] Out: 975 [Urine:975]     Physical Exam: General: Alert, awake, oriented x3, in no acute distress. Heart: Regular rate and rhythm, without murmurs, rubs, gallops. Lungs: Clear to auscultation bilaterally. Abdomen: Soft, nontender, nondistended, positive bowel sounds. Extremities: No clubbing cyanosis or edema with positive pedal pulses. Neuro: Grossly intact, nonfocal.    Lab Results: Results for orders placed during the hospital encounter of 05/09/11 (from the past 24 hour(s))  GLUCOSE, CAPILLARY     Status: Abnormal   Collection Time   05/14/11 11:54 AM      Component Value Range   Glucose-Capillary 201 (*) 70 - 99 (mg/dL)  COMPREHENSIVE METABOLIC PANEL     Status: Abnormal   Collection Time   05/14/11 12:06 PM      Component Value Range   Sodium 140  135 - 145 (mEq/L)   Potassium 4.1  3.5 - 5.1 (mEq/L)   Chloride 103  96 - 112 (mEq/L)   CO2 30  19 - 32 (mEq/L)   Glucose, Bld 197 (*) 70 - 99 (mg/dL)   BUN 20  6 - 23 (mg/dL)   Creatinine, Ser 4.09  0.50 - 1.35 (mg/dL)   Calcium 9.0  8.4 - 81.1 (mg/dL)   Total Protein 7.8  6.0 - 8.3 (g/dL)   Albumin 3.5  3.5 - 5.2 (g/dL)   AST 24  0 - 37 (U/L)   ALT 18  0 - 53 (U/L)   Alkaline Phosphatase 47  39 - 117 (U/L)   Total Bilirubin 0.7  0.3 - 1.2 (mg/dL)   GFR calc non Af Amer 60 (*) >90 (mL/min)   GFR calc Af Amer 69 (*) >90 (mL/min)  GLUCOSE, CAPILLARY      Status: Normal   Collection Time   05/14/11  4:45 PM      Component Value Range   Glucose-Capillary 76  70 - 99 (mg/dL)  GLUCOSE, CAPILLARY     Status: Abnormal   Collection Time   05/14/11  9:14 PM      Component Value Range   Glucose-Capillary 160 (*) 70 - 99 (mg/dL)  PROTIME-INR     Status: Abnormal   Collection Time   05/15/11  6:35 AM      Component Value Range   Prothrombin Time 22.5 (*) 11.6 - 15.2 (seconds)   INR 1.94 (*) 0.00 - 1.49   CBC     Status: Abnormal   Collection Time   05/15/11  6:35 AM      Component Value Range   WBC 5.8  4.0 - 10.5 (K/uL)   RBC 3.36 (*) 4.22 - 5.81 (MIL/uL)   Hemoglobin 10.6 (*) 13.0 - 17.0 (g/dL)   HCT 91.4 (*) 78.2 - 52.0 (%)   MCV 97.3  78.0 - 100.0 (fL)   MCH 31.5  26.0 - 34.0 (pg)   MCHC 32.4  30.0 - 36.0 (  g/dL)   RDW 16.1  09.6 - 04.5 (%)   Platelets 106 (*) 150 - 400 (K/uL)  GLUCOSE, CAPILLARY     Status: Abnormal   Collection Time   05/15/11  8:16 AM      Component Value Range   Glucose-Capillary 131 (*) 70 - 99 (mg/dL)    Studies/Results: Ct Head Wo Contrast  05/14/2011  *RADIOLOGY REPORT*  Clinical Data: 77 year male with confusion.  CT HEAD WITHOUT  IMPRESSION: No acute intracranial abnormality.  Paranasal sinus disease.  Fluid in the right maximal sinus could represent acute sinusitis.  Original Report Authenticated By: Richarda Overlie, M.D.    Medications:    . albuterol  2.5 mg Nebulization QID  . aspirin  81 mg Oral Daily  . bisoprolol  10 mg Oral Daily  . cholecalciferol  2,000 Units Oral Daily  . ezetimibe  10 mg Oral Daily  . finasteride  5 mg Oral Daily  . furosemide  20 mg Oral BID  . gemfibrozil  600 mg Oral BID AC  . glyBURIDE  5 mg Oral BID WC  . guaiFENesin  600 mg Oral BID  . insulin aspart  0-9 Units Subcutaneous TID WC  . levothyroxine  25 mcg Oral QAC breakfast  . moxifloxacin  400 mg Oral q1800  . omega-3 acid ethyl esters  1 g Oral Daily  . Tamsulosin HCl  0.4 mg Oral Daily  . warfarin  4 mg Oral  ONCE-1800  . warfarin  5 mg Oral ONCE-1800    acetaminophen, acetaminophen     Assessment/Plan: 1.Hypoxic respiratory failure secondary to #2 and #3  Improved,will titrate o2 down as tolerated .  2. Community acquired bacterial pneumonia: Improving  Continue po Avelox For 5 more days to complete 10 days of antibiotics.  Continue albuterol nebulizers and mucinex .  3.Acute on chronic CHF  Continue lasix 20 mg po .  2-D echo showed EF of 55-60%, moderate to severe aortic stenosis which is chronic as per patient . With his age and comorbidities he is a poor surgical candidate.  4.Paroxysmal a-fib rate controlled continue bisoprolol and Coumadin per pharmacy .  5. Diabetes mellitus: Controlled, continue Sliding scale insulin And Glyburide  6.Delerium: Improved,probably secondary to PNA , head CT was unremarkable  ,his neurological exam is unremarkable.  Chronic Thrombocytopenia:  ? Etiology , liver function is within normal range, stable would continue to monitor. 7. history of CAD status post CABG  8.CODE STATUS :DO NOT RESUSCITATE  9.Disposition:  PT recommended SNF ,patient is now IV is agreeable. Social worker is working on placement       LOS: 6 days   Gavriella Hearst 05/15/2011, 8:40 AM

## 2011-05-15 NOTE — Progress Notes (Signed)
ANTICOAGULATION CONSULT NOTE - Follow up  Pharmacy Consult for Coumadin Indication: atrial fibrillation  Allergies  Allergen Reactions  . Morphine And Related     Patient Measurements: Height: 6\' 1"  (185.4 cm) Weight: 200 lb 2.8 oz (90.8 kg) (Bed Scale Per RN Hannah-Notified) IBW/kg (Calculated) : 79.9   Vital Signs: Temp: 98 F (36.7 C) (12/03 0451) Temp src: Oral (12/03 0451) BP: 121/53 mmHg (12/03 0451) Pulse Rate: 83  (12/03 0451)  Labs:  Basename 05/15/11 4098 05/14/11 1206 05/14/11 0450 05/13/11 0440  HGB 10.6* -- -- --  HCT 32.7* -- -- --  PLT 106* -- -- --  APTT -- -- -- --  LABPROT 22.5* -- 27.7* 32.3*  INR 1.94* -- 2.53* 3.08*  HEPARINUNFRC -- -- -- --  CREATININE -- 1.01 0.99 1.15  CKTOTAL -- -- -- --  CKMB -- -- -- --  TROPONINI -- -- -- --   Estimated Creatinine Clearance: 46.1 ml/min (by C-G formula based on Cr of 1.01).  Medical History: Past Medical History  Diagnosis Date  . Myocardial infarct   . Diabetes mellitus   . Hypertension   . CHF (congestive heart failure)   . Coronary artery disease   . Dysrhythmia     Medications:  Prescriptions prior to admission  Medication Sig Dispense Refill  . aspirin 81 MG tablet Take 81 mg by mouth daily.        . bisoprolol (ZEBETA) 5 MG tablet Take 10 mg by mouth daily.        . cholecalciferol (VITAMIN D) 1000 UNITS tablet Take 2,000 Units by mouth daily.        Marland Kitchen ezetimibe (ZETIA) 10 MG tablet Take 10 mg by mouth daily.        . finasteride (PROSCAR) 5 MG tablet Take 5 mg by mouth daily.        . fish oil-omega-3 fatty acids 1000 MG capsule Take 1 g by mouth daily.        . furosemide (LASIX) 20 MG tablet Take 20 mg by mouth daily.        Marland Kitchen gemfibrozil (LOPID) 600 MG tablet Take 600 mg by mouth 2 (two) times daily before a meal.        . glyBURIDE (DIABETA) 5 MG tablet Take 5 mg by mouth QID.       Marland Kitchen guaifenesin (HUMIBID E) 400 MG TABS Take 400 mg by mouth every 4 (four) hours.        Marland Kitchen  levothyroxine (SYNTHROID, LEVOTHROID) 25 MCG tablet Take 25 mcg by mouth daily.        . Tamsulosin HCl (FLOMAX) 0.4 MG CAPS Take 0.4 mg by mouth daily.        Marland Kitchen warfarin (COUMADIN) 5 MG tablet Take 5 mg by mouth daily. Mon, Thurs, Sat      . warfarin (COUMADIN) 7.5 MG tablet Take 7.5 mg by mouth daily. Tues, Wed, Fri, Sun         Assessment:  64 YOM with h/o afib on chronic coumadin PTA   Home dose = 5 mg Mon, Thurs and Sat and 7.5 mg Tue, Wed, Fri, and Sun.    INR now slightly < goal range 2-3 after holding doses since admit. Lower dose than usual home regimen resumed last pm, especially given drug interaction with newly-started Avelox.  Noted baseline thrombocytopenia.    No bleeding/complications reported.    Goal of Therapy:  INR 2-3   Plan:   Warfarin 5mg  PO  x1 at 18:00  Follow up PT/INR in AM  Rollene Fare 05/15/2011 7:30 AM

## 2011-05-16 LAB — GLUCOSE, CAPILLARY
Glucose-Capillary: 134 mg/dL — ABNORMAL HIGH (ref 70–99)
Glucose-Capillary: 246 mg/dL — ABNORMAL HIGH (ref 70–99)
Glucose-Capillary: 188 mg/dL — ABNORMAL HIGH (ref 70–99)

## 2011-05-16 LAB — PROTIME-INR: Prothrombin Time: 21.8 seconds — ABNORMAL HIGH (ref 11.6–15.2)

## 2011-05-16 MED ORDER — WARFARIN SODIUM 7.5 MG PO TABS
7.5000 mg | ORAL_TABLET | Freq: Once | ORAL | Status: AC
  Administered 2011-05-16: 7.5 mg via ORAL
  Filled 2011-05-16: qty 1

## 2011-05-16 MED ORDER — GUAIFENESIN ER 600 MG PO TB12: 600.0000 mg | ORAL_TABLET | Freq: Two times a day (BID) | ORAL | Status: DC

## 2011-05-16 MED ORDER — MOXIFLOXACIN HCL 400 MG PO TABS: 400.0000 mg | ORAL_TABLET | Freq: Every day | ORAL | Status: AC

## 2011-05-16 MED ORDER — GLYBURIDE 5 MG PO TABS: 5.0000 mg | ORAL_TABLET | Freq: Two times a day (BID) | ORAL | Status: DC

## 2011-05-16 NOTE — Progress Notes (Signed)
Pt ambulated with O2 sat of 90-93% on RA.

## 2011-05-16 NOTE — Progress Notes (Signed)
Occupational Therapy Treatment Patient Details Name: David Davidson MRN: [id number] DOB: 12-14-1910 Today's Date: 05/16/[id number] 2SC OT Assessment/Plan OT Assessment/Plan OT Plan: Discharge plan remains appropriate Follow Up Recommendations: Skilled nursing facility Equipment Recommended: Defer to next venue OT Goals ADL Goals ADL Goal: Grooming - Progress: Progressing toward goals ADL Goal: Upper Body Bathing - Progress: Progressing toward goals ADL Goal: Lower Body Bathing - Progress: Progressing toward goals ADL Goal: Upper Body Dressing - Progress: Progressing toward goals ADL Goal: Lower Body Dressing - Progress: Progressing toward goals ADL Goal: Toilet Transfer - Progress: Progressing toward goals ADL Goal: Toileting - Clothing Manipulation - Progress: Progressing toward goals ADL Goal: Toileting - Hygiene - Progress: Progressing toward goals  OT Treatment Precautions/Restrictions      ADL ADL Grooming: Performed;Wash/dry hands;Wash/dry face;Brushing hair;Other (comment);Set up (applied deodorant) Where Assessed - Grooming: Other (comment) (on 3:1) Upper Body Bathing: Performed;Chest;Right arm;Left arm;Abdomen;Set up Where Assessed - Upper Body Bathing: Other (comment) (3:1) Lower Body Bathing: Performed;Set up Where Assessed - Lower Body Bathing: Other (comment) (sit <>stand from 3:1) Upper Body Dressing: Performed;Set up Where Assessed - Upper Body Dressing: Other (comment) (from 3:1) Lower Body Dressing: Set up;Other (comment) (sit <>stand from 3:1) Where Assessed - Lower Body Dressing: Other (comment) (sit <>stand from 3:1) Toilet Transfer: Research scientist (life sciences) Details (indicate cue type and reason): vcs for hand placement on arms of commode Toilet Transfer Method: Stand pivot Acupuncturist: Set designer - Clothing Manipulation: Performed;Supervision/safety Toileting - Clothing Manipulation Details (indicate cue  type and reason): vcs for proper sequencing when threading LEs into underwear (pt tried to don underwear backwards) Where Assessed - Toileting Clothing Manipulation: Sit to stand from 3-in-1 or toilet Toileting - Hygiene: Performed;Supervision/safety Where Assessed - Toileting Hygiene: Standing;Other (comment) (w/RW) Tub/Shower Transfer: Not assessed Tub/Shower Transfer Method: Not assessed Equipment Used: Rolling walker (3:1) Mobility  Bed Mobility Bed Mobility: Yes Supine to Sit: 5: Supervision;With rails;HOB elevated (Comment degrees) (30 degrees) Transfers Transfers: Yes Sit to Stand: 5: Supervision;From chair/3-in-1;With armrests (to chair w/RW) Sit to Stand Details (indicate cue type and reason): VCs for hand placement Stand to Sit: 5: Supervision;To chair/3-in-1;With armrests;With upper extremity assist;Other (comment) (VCs for hand placement) Exercises    End of Session    Joene Gelder A [phone]  05/16/2011, 10:15 AM

## 2011-05-16 NOTE — Progress Notes (Signed)
ANTICOAGULATION CONSULT NOTE - Follow up  Pharmacy Consult for Coumadin Indication: atrial fibrillation  Allergies  Allergen Reactions  . Morphine And Related     Patient Measurements: Height: 6\' 1"  (185.4 cm) Weight: 199 lb 8.3 oz (90.5 kg) IBW/kg (Calculated) : 79.9   Vital Signs: Temp: 98.2 F (36.8 C) (12/04 0445) Temp src: Oral (12/04 0445) BP: 104/68 mmHg (12/04 0445) Pulse Rate: 89  (12/04 0445)  Labs:  Basename 05/16/11 0504 05/15/11 0635 05/14/11 1206 05/14/11 0450  HGB -- 10.6* -- --  HCT -- 32.7* -- --  PLT -- 106* -- --  APTT -- -- -- --  LABPROT 21.8* 22.5* -- 27.7*  INR 1.86* 1.94* -- 2.53*  HEPARINUNFRC -- -- -- --  CREATININE -- -- 1.01 0.99  CKTOTAL -- -- -- --  CKMB -- -- -- --  TROPONINI -- -- -- --   Estimated Creatinine Clearance: 46.1 ml/min (by C-G formula based on Cr of 1.01).  Medical History: Past Medical History  Diagnosis Date  . Myocardial infarct   . Diabetes mellitus   . Hypertension   . CHF (congestive heart failure)   . Coronary artery disease   . Dysrhythmia     Medications:  Prescriptions prior to admission  Medication Sig Dispense Refill  . aspirin 81 MG tablet Take 81 mg by mouth daily.        . bisoprolol (ZEBETA) 5 MG tablet Take 10 mg by mouth daily.        . cholecalciferol (VITAMIN D) 1000 UNITS tablet Take 2,000 Units by mouth daily.        Marland Kitchen ezetimibe (ZETIA) 10 MG tablet Take 10 mg by mouth daily.        . finasteride (PROSCAR) 5 MG tablet Take 5 mg by mouth daily.        . fish oil-omega-3 fatty acids 1000 MG capsule Take 1 g by mouth daily.        . furosemide (LASIX) 20 MG tablet Take 20 mg by mouth daily.        Marland Kitchen gemfibrozil (LOPID) 600 MG tablet Take 600 mg by mouth 2 (two) times daily before a meal.        . glyBURIDE (DIABETA) 5 MG tablet Take 5 mg by mouth QID.       Marland Kitchen guaifenesin (HUMIBID E) 400 MG TABS Take 400 mg by mouth every 4 (four) hours.        Marland Kitchen levothyroxine (SYNTHROID, LEVOTHROID) 25 MCG  tablet Take 25 mcg by mouth daily.        . Tamsulosin HCl (FLOMAX) 0.4 MG CAPS Take 0.4 mg by mouth daily.        Marland Kitchen warfarin (COUMADIN) 5 MG tablet Take 5 mg by mouth daily. Mon, Thurs, Sat      . warfarin (COUMADIN) 7.5 MG tablet Take 7.5 mg by mouth daily. Tues, Wed, Fri, Sun         Assessment:  Home dose = 5 mg Mon, Thurs and Sat and 7.5 mg Tue, Wed, Fri, and Sun.    INR now falling as expected after holding doses on  admit for supratherapeutic INR.   Small doses given 12/2 and 12/3 (4mg , 5mg )  Noted baseline thrombocytopenia.    No bleeding/complications reported.    Goal of Therapy:  INR 2-3   Plan:   Increase warfarin 7.5mg  PO x1 at 18:00  Follow up PT/INR in AM  Monitor for potiential interaction with Avelox (increase INR)  Rollene Fare 05/16/2011 7:47 AM Pager: [phone]

## 2011-05-16 NOTE — Discharge Summary (Addendum)
Patient ID: IZYK MARTY MRN: [id number] DOB/AGE: 75-Jul-1912 75 y.o.  Admit date: 05/09/2011 Discharge date: 05/16/2011  Primary Care Physician:  No primary provider on file.   Discharge Diagnoses:    Present on Admission:  .Paroxysmal a-fib .Diabetes mellitus .Community acquired bacterial pneumonia *Acute hypoxic respiratory failure *Delirium  Current Discharge Medication List    START taking these medications   Details  guaiFENesin (MUCINEX) 600 MG 12 hr tablet Take 1 tablet (600 mg total) by mouth 2 (two) times daily. Qty: 14 tablet, Refills: 0    moxifloxacin (AVELOX) 400 MG tablet Take 1 tablet (400 mg total) by mouth daily at 6 PM. Qty: 4 tablet, Refills: 0      CONTINUE these medications which have CHANGED   Details  glyBURIDE (DIABETA) 5 MG tablet Take 1 tablet (5 mg total) by mouth 2 (two) times daily with a meal. Qty: 60 tablet, Refills: 0      CONTINUE these medications which have NOT CHANGED   Details  aspirin 81 MG tablet Take 81 mg by mouth daily.      bisoprolol (ZEBETA) 5 MG tablet Take 10 mg by mouth daily.      cholecalciferol (VITAMIN D) 1000 UNITS tablet Take 2,000 Units by mouth daily.      ezetimibe (ZETIA) 10 MG tablet Take 10 mg by mouth daily.      finasteride (PROSCAR) 5 MG tablet Take 5 mg by mouth daily.      fish oil-omega-3 fatty acids 1000 MG capsule Take 1 g by mouth daily.      furosemide (LASIX) 20 MG tablet Take 20 mg by mouth daily.      gemfibrozil (LOPID) 600 MG tablet Take 600 mg by mouth 2 (two) times daily before a meal.      levothyroxine (SYNTHROID, LEVOTHROID) 25 MCG tablet Take 25 mcg by mouth daily.      Tamsulosin HCl (FLOMAX) 0.4 MG CAPS Take 0.4 mg by mouth daily.      !! warfarin (COUMADIN) 5 MG tablet Take 5 mg by mouth daily. Mon, Thurs, Sat    !! warfarin (COUMADIN) 7.5 MG tablet Take 7.5 mg by mouth daily. Tues, Wed, Fri, Sun      !! - Potential duplicate medications found. Please discuss with  provider.    STOP taking these medications     guaifenesin (HUMIBID E) 400 MG TABS          Consults:   None   Significant Diagnostic Studies:  Dg Chest 2 View  05/10/2011  *RADIOLOGY REPORT*  Clinical Data: Pneumonia, congestive heart failure  CHEST - 2 VIEW  Comparison: Chest radiograph 05/09/2011  Findings: Sternotomy wires overlie stable enlarged heart silhouette.  There is mild air space disease at the lung bases. There are small bilateral pleural effusions.  No pneumothorax.  No focal consolidation.  No acute osseous abnormality.  IMPRESSION:  1.  Cardiomegaly and mild air space disease suggests mild congestive heart failure. 2.  Small bilateral pleural effusions.  Original Report Authenticated By: Genevive Bi, M.D.   Dg Chest 2 View  05/09/2011  *RADIOLOGY REPORT*  Clinical Data: Cough, shortness of breath and fever.  CHEST - 2 VIEW  Comparison: Chest radiograph performed 10/31/2008  Findings: The lungs are well-aerated.  Streaky bilateral airspace opacities, most prominent at the mid lung zones bilaterally and left lung base, raise suspicion for pneumonia.  Mild underlying vascular congestion is again noted.  There is no evidence of significant pleural effusion or pneumothorax.  The heart is mildly enlarged; the patient is status post median sternotomy, with evidence of prior CABG.  No acute osseous abnormalities are seen.  IMPRESSION:  1.  Streaky bilateral airspace opacities, most prominent at the midlung zones bilaterally and at the left lung base, raise suspicion for pneumonia. 2.  Mild vascular congestion and mild cardiomegaly again noted.  Original Report Authenticated By: Tonia Ghent, M.D.   Ct Head Wo Contrast  05/14/2011  *RADIOLOGY REPORT*  Clinical Data: 77 year male with confusion.  CT HEAD WITHOUT CONTRAST  Technique:  Contiguous axial images were obtained from the base of the skull through the vertex without contrast.  Comparison: None.  Findings: There is cerebral  atrophy, particularly involving the frontal lobes.  No evidence for acute hemorrhage, mass lesion, midline shift, hydrocephalus or large infarct.  Diffuse calcifications involving the falx.  There is fluid in the right maxillary sinus and mucosal thickening and possibly fluid in left maxillary sinus.  Diffuse opacification of the ethmoid air cells. No acute bony abnormality.  IMPRESSION: No acute intracranial abnormality.  Paranasal sinus disease.  Fluid in the right maximal sinus could represent acute sinusitis.  Original Report Authenticated By: Richarda Overlie, M.D.    Brief H and P: For complete details please refer to admission H and P, but in brief  Feels is a 77 year old gentleman with history of CABG, diabetes, hypertension, A. fib on Coumadin reports that he was in his usual state of health until a week ago. His symptoms started with a cold it states a goal in progress to worsening cough associated with thick yellow sputum with streaks of blood. This has been associated with increasing shortness of breath, wheezing and fevers and chills.  He denies any sick contacts he denies PND or orthopnea denies swelling in his lower extremities.    Hospital Course:  1.Hypoxic respiratory failure secondary to #2 and #3  Improved,will  D/c on oxygen 2 L via [place] ,titrate o2 down as tolerated .  2. Community acquired bacterial pneumonia: Improved Continue po Avelox For 4 more days to complete 10 days of antibiotics.  Continue mucinex .  3.Acute on chronic CHF  Continue lasix 20 mg po .  2-D echo showed EF of 55-60%, moderate to severe aortic stenosis which is chronic as per patient . With his age and comorbidities he is a poor surgical candidate. He would probably need repeat echocardiogram as an outpatient and followup with PCP and cardiology if needed. 4.Paroxysmal a-fib rate controlled continue bisoprolol and Coumadin , monitor PT/INR and adjust dose, pharmacy was adjusting in the hospital .  5. Diabetes  mellitus: Controlled,Glyburide , monitor CBGs and adjust dose. 6.Delerium: Improved,probably secondary to PNA , head CT was unremarkable ,his neurological exam is unremarkable. Unknown if there is underlying dementia, workup for dementia can be done as an outpatient. Patient advised not to drive. He is currently alert and oriented x3, son was at the bedside and aware of my recommendations. Chronic Thrombocytopenia:  ? Etiology , liver function is within normal range, further workup can be done as an outpatient, will defer  to PCP. 7. history of CAD status post CABG  8.CODE STATUS :DO NOT RESUSCITATE  9.Disposition:  Patient was seen by physical therapy and recommendation to discharge to SNF.  Subjective  Patient seen and examined today feeling better and denies any complaints.    Filed Vitals:   05/16/11 1107  BP:   Pulse: 102  Temp:   Resp: 20  General: Alert, awake, oriented x3, in no acute distress. Heart: Regular rate and rhythm, without murmurs, rubs, gallops. Lungs: Clear to auscultation bilaterally. Abdomen: Soft, nontender, nondistended, positive bowel sounds. Extremities: No clubbing cyanosis or edema with positive pedal pulses. Neuro: Grossly intact, nonfocal.   Disposition and Follow-up:  Skilled nursing facility  Will need repeat PT/INR in one to 2 days to adjust Coumadin dose as per rounding physician at SNF  Followup with PCP   Time spent on Discharge: 50 minutes   Signed: Zayne Marovich 05/16/2011, 12:03 PM

## 2011-05-17 LAB — PROTIME-INR
INR: 2.1 — ABNORMAL HIGH (ref 0.00–1.49)
Prothrombin Time: 23.9 seconds — ABNORMAL HIGH (ref 11.6–15.2)

## 2011-05-17 LAB — GLUCOSE, CAPILLARY: Glucose-Capillary: 117 mg/dL — ABNORMAL HIGH (ref 70–99)

## 2011-05-17 MED ORDER — ZOLPIDEM TARTRATE 5 MG PO TABS
5.0000 mg | ORAL_TABLET | Freq: Once | ORAL | Status: AC
  Administered 2011-05-17: 5 mg via ORAL
  Filled 2011-05-17: qty 1

## 2011-05-17 MED ORDER — WARFARIN SODIUM 5 MG PO TABS
5.0000 mg | ORAL_TABLET | Freq: Once | ORAL | Status: DC
  Filled 2011-05-17: qty 1

## 2011-05-17 NOTE — Progress Notes (Signed)
Subjective: Patient with no complaints. Patient ready to leave. Objective: Vital signs in last 24 hours: Temp:  [97.7 F (36.5 C)-98.6 F (37 C)] 98.3 F (36.8 C) (12/05 1430) Pulse Rate:  [90-98] 92  (12/05 1430) Resp:  [19-20] 19  (12/05 1430) BP: (112-119)/(63-70) 118/68 mmHg (12/05 1430) SpO2:  [93 %-96 %] 96 % (12/05 1430) Weight:  [90.629 kg (199 lb 12.8 oz)] 199 lb 12.8 oz (90.629 kg) (12/05 0444) Weight change: 0.129 kg (4.5 oz) Last BM Date: 05/17/11  Intake/Output from previous day: 12/04 0701 - 12/05 0700 In: 960 [P.O.:960] Out: 1050 [Urine:1050] Total I/O In: 480 [P.O.:480] Out: 300 [Urine:300]   Physical Exam: General: Alert, awake, oriented x3, in no acute distress. Heart: Regular rate and rhythm, 3/6 SEM Lungs: Clear to auscultation bilaterally. Abdomen: Soft, nontender, nondistended, positive bowel sounds. Extremities: No clubbing cyanosis or edema with positive pedal pulses. Neuro: Grossly intact, nonfocal.    Lab Results: Results for orders placed during the hospital encounter of 05/09/11 (from the past 24 hour(s))  GLUCOSE, CAPILLARY     Status: Abnormal   Collection Time   05/16/11  4:26 PM      Component Value Range   Glucose-Capillary 246 (*) 70 - 99 (mg/dL)  GLUCOSE, CAPILLARY     Status: Abnormal   Collection Time   05/16/11  8:44 PM      Component Value Range   Glucose-Capillary 188 (*) 70 - 99 (mg/dL)  PROTIME-INR     Status: Abnormal   Collection Time   05/17/11  4:43 AM      Component Value Range   Prothrombin Time 23.9 (*) 11.6 - 15.2 (seconds)   INR 2.10 (*) 0.00 - 1.49   GLUCOSE, CAPILLARY     Status: Abnormal   Collection Time   05/17/11  7:44 AM      Component Value Range   Glucose-Capillary 117 (*) 70 - 99 (mg/dL)  GLUCOSE, CAPILLARY     Status: Abnormal   Collection Time   05/17/11 11:51 AM      Component Value Range   Glucose-Capillary 279 (*) 70 - 99 (mg/dL)    Studies/Results: Ct Head Wo Contrast  05/14/2011   *RADIOLOGY REPORT*  Clinical Data: 77 year male with confusion.  CT HEAD WITHOUT  IMPRESSION: No acute intracranial abnormality.  Paranasal sinus disease.  Fluid in the right maximal sinus could represent acute sinusitis.  Original Report Authenticated By: Richarda Overlie, M.D.    Medications:    . albuterol  2.5 mg Nebulization QID  . aspirin  81 mg Oral Daily  . bisoprolol  10 mg Oral Daily  . cholecalciferol  2,000 Units Oral Daily  . ezetimibe  10 mg Oral Daily  . finasteride  5 mg Oral Daily  . furosemide  20 mg Oral BID  . gemfibrozil  600 mg Oral BID AC  . glyBURIDE  5 mg Oral BID WC  . guaiFENesin  600 mg Oral BID  . insulin aspart  0-9 Units Subcutaneous TID WC  . levothyroxine  25 mcg Oral QAC breakfast  . moxifloxacin  400 mg Oral q1800  . omega-3 acid ethyl esters  1 g Oral Daily  . Tamsulosin HCl  0.4 mg Oral Daily  . warfarin  5 mg Oral ONCE-1800  . warfarin  7.5 mg Oral ONCE-1800  . zolpidem  5 mg Oral Once    acetaminophen, acetaminophen     Assessment/Plan: 1.Hypoxic respiratory failure secondary to #2 and #3  Improved,will titrate  o2 down as tolerated .  2. Community acquired bacterial pneumonia: Improving  Continue po Avelox For 4 more days to complete 10 days of antibiotics.  Continue albuterol nebulizers and mucinex .  3.Acute on chronic CHF  Stable. Compensated. Continue lasix 20 mg po .  2-D echo showed EF of 55-60%, moderate to severe aortic stenosis which is chronic as per patient . With his age and comorbidities he is a poor surgical candidate.  4.Paroxysmal a-fib rate controlled continue bisoprolol and Coumadin per pharmacy .  5. Diabetes mellitus: Controlled, continue Sliding scale insulin And Glyburide  6.Delerium: Improved,probably secondary to PNA , head CT was unremarkable  ,his neurological exam is unremarkable.  Chronic Thrombocytopenia:  ? Etiology , liver function is within normal range, stable would continue to monitor. 7. history of CAD  status post CABG  8.CODE STATUS :DO NOT RESUSCITATE  9.Disposition:   To SNF today.       LOS: 8 days   David Davidson,David Davidson 05/17/2011, 2:46 PM

## 2011-05-17 NOTE — Progress Notes (Signed)
ANTICOAGULATION CONSULT NOTE - Follow Up Consult  Pharmacy Consult for Warfarin Indication: atrial fibrillation  Allergies  Allergen Reactions  . Morphine And Related     Patient Measurements: Height: 6\' 1"  (185.4 cm) Weight: 199 lb 12.8 oz (90.629 kg) IBW/kg (Calculated) : 79.9   Vital Signs: Temp: 98.6 F (37 C) (12/05 0444) Temp src: Oral (12/05 0444) BP: 119/70 mmHg (12/05 0444) Pulse Rate: 98  (12/05 0444)  Labs:  Basename 05/17/11 0443 05/16/11 0504 05/15/11 0635 05/14/11 1206  HGB -- -- 10.6* --  HCT -- -- 32.7* --  PLT -- -- 106* --  APTT -- -- -- --  LABPROT 23.9* 21.8* 22.5* --  INR 2.10* 1.86* 1.94* --  HEPARINUNFRC -- -- -- --  CREATININE -- -- -- 1.01  CKTOTAL -- -- -- --  CKMB -- -- -- --  TROPONINI -- -- -- --   Estimated Creatinine Clearance: 46.1 ml/min (by C-G formula based on Cr of 1.01).   Medications:  Scheduled:    . albuterol  2.5 mg Nebulization QID  . aspirin  81 mg Oral Daily  . bisoprolol  10 mg Oral Daily  . cholecalciferol  2,000 Units Oral Daily  . ezetimibe  10 mg Oral Daily  . finasteride  5 mg Oral Daily  . furosemide  20 mg Oral BID  . gemfibrozil  600 mg Oral BID AC  . glyBURIDE  5 mg Oral BID WC  . guaiFENesin  600 mg Oral BID  . insulin aspart  0-9 Units Subcutaneous TID WC  . levothyroxine  25 mcg Oral QAC breakfast  . moxifloxacin  400 mg Oral q1800  . omega-3 acid ethyl esters  1 g Oral Daily  . Tamsulosin HCl  0.4 mg Oral Daily  . warfarin  7.5 mg Oral ONCE-1800  . zolpidem  5 mg Oral Once    Assessment: 77 yo M on chronic warfarin for hx afib. Pharmacy dosing inpatient. Home dose = 5 mg Mon, Thurs and Sat and 7.5 mg Tue, Wed, Fri, and Sun. Inpatient doses: 5, 0, 0, 4, 5, 7.5mg  11/29-12/4.  INR up and therapeutic. No bleeding reported. On day #5 Avelox, may increase INR.   Goal of Therapy:  INR 2-3   Plan:  Warfarin 5mg  tonight. F/U AM INR.  Gwen Her 05/17/2011,10:26 AM

## 2011-05-17 NOTE — Progress Notes (Signed)
Patient cleared to go to [facility]. Blue Medicare auth obtained. Pts son to drive him. Chart copied and given to son to give to facility.  Colbi Staubs C. Cassey Hurrell MSW, LCSW [phone]

## 2011-05-29 ENCOUNTER — Emergency Department (HOSPITAL_COMMUNITY): Payer: Medicare Other

## 2011-05-29 ENCOUNTER — Encounter (HOSPITAL_COMMUNITY): Payer: Self-pay | Admitting: Emergency Medicine

## 2011-05-29 ENCOUNTER — Inpatient Hospital Stay (HOSPITAL_COMMUNITY)
Admission: EM | Admit: 2011-05-29 | Discharge: 2011-06-02 | DRG: 372 | Disposition: A | Discharge status: Skilled Nursing Facility | Payer: Medicare Other | Attending: Internal Medicine | Admitting: Internal Medicine

## 2011-05-29 DIAGNOSIS — D649 Anemia, unspecified: Secondary | ICD-10-CM | POA: Diagnosis present

## 2011-05-29 DIAGNOSIS — I4891 Unspecified atrial fibrillation: Secondary | ICD-10-CM | POA: Diagnosis present

## 2011-05-29 DIAGNOSIS — E119 Type 2 diabetes mellitus without complications: Secondary | ICD-10-CM | POA: Diagnosis present

## 2011-05-29 DIAGNOSIS — E876 Hypokalemia: Secondary | ICD-10-CM | POA: Diagnosis present

## 2011-05-29 DIAGNOSIS — IMO0002 Reserved for concepts with insufficient information to code with codable children: Secondary | ICD-10-CM | POA: Diagnosis present

## 2011-05-29 DIAGNOSIS — E118 Type 2 diabetes mellitus with unspecified complications: Secondary | ICD-10-CM | POA: Diagnosis present

## 2011-05-29 DIAGNOSIS — I359 Nonrheumatic aortic valve disorder, unspecified: Secondary | ICD-10-CM | POA: Diagnosis present

## 2011-05-29 DIAGNOSIS — E039 Hypothyroidism, unspecified: Secondary | ICD-10-CM | POA: Diagnosis present

## 2011-05-29 DIAGNOSIS — I10 Essential (primary) hypertension: Secondary | ICD-10-CM | POA: Diagnosis present

## 2011-05-29 DIAGNOSIS — A0472 Enterocolitis due to Clostridium difficile, not specified as recurrent: Principal | ICD-10-CM | POA: Diagnosis present

## 2011-05-29 DIAGNOSIS — D72829 Elevated white blood cell count, unspecified: Secondary | ICD-10-CM | POA: Diagnosis present

## 2011-05-29 DIAGNOSIS — I2510 Atherosclerotic heart disease of native coronary artery without angina pectoris: Secondary | ICD-10-CM | POA: Diagnosis present

## 2011-05-29 DIAGNOSIS — R791 Abnormal coagulation profile: Secondary | ICD-10-CM | POA: Diagnosis present

## 2011-05-29 DIAGNOSIS — Z951 Presence of aortocoronary bypass graft: Secondary | ICD-10-CM

## 2011-05-29 DIAGNOSIS — R197 Diarrhea, unspecified: Secondary | ICD-10-CM

## 2011-05-29 DIAGNOSIS — Z66 Do not resuscitate: Secondary | ICD-10-CM | POA: Diagnosis present

## 2011-05-29 DIAGNOSIS — E785 Hyperlipidemia, unspecified: Secondary | ICD-10-CM | POA: Diagnosis present

## 2011-05-29 DIAGNOSIS — N390 Urinary tract infection, site not specified: Secondary | ICD-10-CM | POA: Diagnosis present

## 2011-05-29 DIAGNOSIS — I252 Old myocardial infarction: Secondary | ICD-10-CM

## 2011-05-29 DIAGNOSIS — Z87891 Personal history of nicotine dependence: Secondary | ICD-10-CM

## 2011-05-29 DIAGNOSIS — I509 Heart failure, unspecified: Secondary | ICD-10-CM | POA: Diagnosis present

## 2011-05-29 DIAGNOSIS — D696 Thrombocytopenia, unspecified: Secondary | ICD-10-CM | POA: Diagnosis present

## 2011-05-29 DIAGNOSIS — E870 Hyperosmolality and hypernatremia: Secondary | ICD-10-CM | POA: Diagnosis present

## 2011-05-29 DIAGNOSIS — I480 Paroxysmal atrial fibrillation: Secondary | ICD-10-CM | POA: Diagnosis present

## 2011-05-29 LAB — COMPREHENSIVE METABOLIC PANEL
ALT: 15 U/L (ref 0–53)
BUN: 16 mg/dL (ref 6–23)
CO2: 27 mEq/L (ref 19–32)
Calcium: 8.8 mg/dL (ref 8.4–10.5)
GFR calc Af Amer: 81 mL/min — ABNORMAL LOW (ref >90)
GFR calc non Af Amer: 70 mL/min — ABNORMAL LOW (ref >90)
Glucose, Bld: 179 mg/dL — ABNORMAL HIGH (ref 70–99)
Sodium: 143 mEq/L (ref 135–145)
Total Protein: 7.2 g/dL (ref 6.0–8.3)

## 2011-05-29 LAB — URINALYSIS, ROUTINE W REFLEX MICROSCOPIC
Bilirubin Urine: NEGATIVE
Glucose, UA: NEGATIVE mg/dL
Nitrite: NEGATIVE
Specific Gravity, Urine: 1.017 (ref 1.005–1.030)
pH: 5.5 (ref 5.0–8.0)

## 2011-05-29 LAB — DIFFERENTIAL
Eosinophils Absolute: 0.1 10*3/uL (ref 0.0–0.7)
Eosinophils Relative: 1 % (ref 0–5)
Lymphocytes Relative: 7 % — ABNORMAL LOW (ref 12–46)
Lymphs Abs: 0.9 10*3/uL (ref 0.7–4.0)
Monocytes Absolute: 1.2 10*3/uL — ABNORMAL HIGH (ref 0.1–1.0)
Monocytes Relative: 9 % (ref 3–12)

## 2011-05-29 LAB — URINE MICROSCOPIC-ADD ON

## 2011-05-29 LAB — CLOSTRIDIUM DIFFICILE BY PCR: Toxigenic C. Difficile by PCR: POSITIVE — AB

## 2011-05-29 LAB — LIPID PANEL
HDL: 18 mg/dL — ABNORMAL LOW (ref >39)
LDL Cholesterol: 77 mg/dL (ref 0–99)
Total CHOL/HDL Ratio: 6.8 RATIO

## 2011-05-29 LAB — CBC
HCT: 37.4 % — ABNORMAL LOW (ref 39.0–52.0)
Hemoglobin: 12.4 g/dL — ABNORMAL LOW (ref 13.0–17.0)
MCH: 32.1 pg (ref 26.0–34.0)
MCV: 96.9 fL (ref 78.0–100.0)
RBC: 3.86 MIL/uL — ABNORMAL LOW (ref 4.22–5.81)
WBC: 13.1 10*3/uL — ABNORMAL HIGH (ref 4.0–10.5)

## 2011-05-29 LAB — PROTIME-INR: Prothrombin Time: 58.6 seconds — ABNORMAL HIGH (ref 11.6–15.2)

## 2011-05-29 MED ORDER — GEMFIBROZIL 600 MG PO TABS
600.0000 mg | ORAL_TABLET | Freq: Two times a day (BID) | ORAL | Status: DC
  Administered 2011-05-30 – 2011-06-02 (×7): 600 mg via ORAL
  Filled 2011-05-29 (×9): qty 1

## 2011-05-29 MED ORDER — POTASSIUM CHLORIDE CRYS ER 20 MEQ PO TBCR
40.0000 meq | EXTENDED_RELEASE_TABLET | Freq: Every day | ORAL | Status: DC
  Administered 2011-05-30 – 2011-06-01 (×3): 40 meq via ORAL
  Filled 2011-05-29 (×4): qty 2

## 2011-05-29 MED ORDER — SODIUM CHLORIDE 0.9 % IV SOLN
Freq: Once | INTRAVENOUS | Status: AC
  Administered 2011-05-29: 19:00:00 via INTRAVENOUS

## 2011-05-29 MED ORDER — CIPROFLOXACIN IN D5W 400 MG/200ML IV SOLN
400.0000 mg | Freq: Once | INTRAVENOUS | Status: DC
  Filled 2011-05-29: qty 200

## 2011-05-29 MED ORDER — FINASTERIDE 5 MG PO TABS
5.0000 mg | ORAL_TABLET | Freq: Every day | ORAL | Status: DC
  Administered 2011-05-30 – 2011-06-02 (×4): 5 mg via ORAL
  Filled 2011-05-29 (×5): qty 1

## 2011-05-29 MED ORDER — EZETIMIBE 10 MG PO TABS
10.0000 mg | ORAL_TABLET | Freq: Every day | ORAL | Status: DC
  Administered 2011-05-30 – 2011-06-02 (×4): 10 mg via ORAL
  Filled 2011-05-29 (×5): qty 1

## 2011-05-29 MED ORDER — PHYTONADIONE 5 MG PO TABS
5.0000 mg | ORAL_TABLET | Freq: Once | ORAL | Status: AC
  Administered 2011-05-29: 5 mg via ORAL
  Filled 2011-05-29: qty 1

## 2011-05-29 MED ORDER — METRONIDAZOLE IN NACL 5-0.79 MG/ML-% IV SOLN
500.0000 mg | Freq: Once | INTRAVENOUS | Status: AC
  Administered 2011-05-29: 500 mg via INTRAVENOUS
  Filled 2011-05-29: qty 100

## 2011-05-29 MED ORDER — CIPROFLOXACIN IN D5W 400 MG/200ML IV SOLN
400.0000 mg | Freq: Two times a day (BID) | INTRAVENOUS | Status: DC
  Administered 2011-05-29: 400 mg via INTRAVENOUS
  Filled 2011-05-29: qty 200

## 2011-05-29 MED ORDER — TAMSULOSIN HCL 0.4 MG PO CAPS: 0.4000 mg | ORAL_CAPSULE | Freq: Every day | ORAL | Status: DC

## 2011-05-29 MED ORDER — GUAIFENESIN ER 600 MG PO TB12
600.0000 mg | ORAL_TABLET | Freq: Two times a day (BID) | ORAL | Status: DC
  Administered 2011-05-30 – 2011-06-02 (×7): 600 mg via ORAL
  Filled 2011-05-29 (×9): qty 1

## 2011-05-29 MED ORDER — ONDANSETRON HCL 4 MG/2ML IJ SOLN: 4.0000 mg | Freq: Four times a day (QID) | INTRAMUSCULAR | Status: DC | PRN

## 2011-05-29 MED ORDER — METRONIDAZOLE IN NACL 5-0.79 MG/ML-% IV SOLN
500.0000 mg | Freq: Three times a day (TID) | INTRAVENOUS | Status: DC
  Administered 2011-05-29 – 2011-05-30 (×2): 500 mg via INTRAVENOUS
  Filled 2011-05-29 (×4): qty 100

## 2011-05-29 MED ORDER — SODIUM CHLORIDE 0.9 % IV SOLN: INTRAVENOUS | Status: DC

## 2011-05-29 MED ORDER — ZOLPIDEM TARTRATE 5 MG PO TABS
5.0000 mg | ORAL_TABLET | Freq: Every evening | ORAL | Status: DC | PRN
  Administered 2011-05-31 – 2011-06-01 (×2): 5 mg via ORAL
  Filled 2011-05-29 (×2): qty 1

## 2011-05-29 MED ORDER — BISOPROLOL FUMARATE 10 MG PO TABS
10.0000 mg | ORAL_TABLET | Freq: Every day | ORAL | Status: DC
  Administered 2011-05-30 – 2011-06-02 (×4): 10 mg via ORAL
  Filled 2011-05-29 (×5): qty 1

## 2011-05-29 MED ORDER — INSULIN ASPART 100 UNIT/ML ~~LOC~~ SOLN
0.0000 [IU] | SUBCUTANEOUS | Status: DC
  Administered 2011-05-29 – 2011-05-30 (×2): 2 [IU] via SUBCUTANEOUS
  Administered 2011-05-30: 1 [IU] via SUBCUTANEOUS
  Administered 2011-05-30 – 2011-05-31 (×4): 2 [IU] via SUBCUTANEOUS
  Administered 2011-05-31 – 2011-06-01 (×3): 1 [IU] via SUBCUTANEOUS
  Administered 2011-06-01: 2 [IU] via SUBCUTANEOUS
  Administered 2011-06-01 – 2011-06-02 (×6): 1 [IU] via SUBCUTANEOUS
  Filled 2011-05-29: qty 3

## 2011-05-29 MED ORDER — LEVOTHYROXINE SODIUM 25 MCG PO TABS
25.0000 ug | ORAL_TABLET | Freq: Every day | ORAL | Status: DC
  Administered 2011-05-30 – 2011-06-02 (×4): 25 ug via ORAL
  Filled 2011-05-29 (×5): qty 1

## 2011-05-29 MED ORDER — ENOXAPARIN SODIUM 40 MG/0.4ML ~~LOC~~ SOLN: 40.0000 mg | SUBCUTANEOUS | Status: DC

## 2011-05-29 MED ORDER — LOPERAMIDE HCL 2 MG PO CAPS
2.0000 mg | ORAL_CAPSULE | Freq: Two times a day (BID) | ORAL | Status: DC
  Administered 2011-05-30: 2 mg via ORAL
  Filled 2011-05-29 (×3): qty 1

## 2011-05-29 MED ORDER — FUROSEMIDE 20 MG PO TABS: 20.0000 mg | ORAL_TABLET | Freq: Every day | ORAL | Status: DC

## 2011-05-29 MED ORDER — ASPIRIN EC 81 MG PO TBEC
81.0000 mg | DELAYED_RELEASE_TABLET | Freq: Every day | ORAL | Status: DC
  Administered 2011-05-30 – 2011-06-02 (×4): 81 mg via ORAL
  Filled 2011-05-29 (×5): qty 1

## 2011-05-29 MED ORDER — SODIUM CHLORIDE 0.9 % IV BOLUS (SEPSIS)
1000.0000 mL | Freq: Once | INTRAVENOUS | Status: AC
  Administered 2011-05-29: 1000 mL via INTRAVENOUS

## 2011-05-29 MED ORDER — ZINC OXIDE 12.8 % EX OINT
TOPICAL_OINTMENT | Freq: Every day | CUTANEOUS | Status: DC
  Administered 2011-05-30 – 2011-06-02 (×4): via TOPICAL
  Filled 2011-05-29: qty 56.7

## 2011-05-29 MED ORDER — ACETAMINOPHEN 500 MG PO TABS: 1000.0000 mg | ORAL_TABLET | Freq: Every evening | ORAL | Status: DC | PRN

## 2011-05-29 MED ORDER — TAMSULOSIN HCL 0.4 MG PO CAPS
0.4000 mg | ORAL_CAPSULE | Freq: Every day | ORAL | Status: DC
  Administered 2011-05-29 – 2011-06-01 (×4): 0.4 mg via ORAL
  Filled 2011-05-29 (×5): qty 1

## 2011-05-29 MED ORDER — ACETAMINOPHEN 325 MG PO TABS: 325.0000 mg | ORAL_TABLET | Freq: Every evening | ORAL | Status: DC | PRN

## 2011-05-29 MED ORDER — MIRTAZAPINE 7.5 MG PO TABS
7.5000 mg | ORAL_TABLET | Freq: Every day | ORAL | Status: DC
  Administered 2011-05-30 – 2011-06-01 (×3): 7.5 mg via ORAL
  Filled 2011-05-29 (×5): qty 1

## 2011-05-29 MED ORDER — HYDROCORTISONE 1 % EX CREA
1.0000 "application " | TOPICAL_CREAM | Freq: Two times a day (BID) | CUTANEOUS | Status: DC
  Administered 2011-05-29 – 2011-06-02 (×8): 1 via TOPICAL
  Filled 2011-05-29: qty 28

## 2011-05-29 MED ORDER — SACCHAROMYCES BOULARDII 250 MG PO CAPS
250.0000 mg | ORAL_CAPSULE | Freq: Two times a day (BID) | ORAL | Status: DC
  Administered 2011-05-30 – 2011-06-02 (×7): 250 mg via ORAL
  Filled 2011-05-29 (×9): qty 1

## 2011-05-29 MED ORDER — MENTHOL-ZINC OXIDE 0.44-20.625 % EX OINT: 1.0000 | TOPICAL_OINTMENT | Freq: Every day | CUTANEOUS | Status: DC

## 2011-05-29 MED ORDER — SODIUM CHLORIDE 0.9 % IV SOLN
INTRAVENOUS | Status: DC
  Administered 2011-05-29 – 2011-05-30 (×2): via INTRAVENOUS

## 2011-05-29 MED ORDER — ONDANSETRON HCL 8 MG PO TABS
4.0000 mg | ORAL_TABLET | Freq: Four times a day (QID) | ORAL | Status: DC | PRN
  Filled 2011-05-29: qty 0.5

## 2011-05-29 NOTE — ED Notes (Signed)
Pt. Admitted, transferred to the floor via stretcher, pt. Alert and oriented, NAD noted

## 2011-05-29 NOTE — ED Provider Notes (Signed)
History     CSN: [id number] Arrival date & time: 05/29/2011  1:34 PM   First MD Initiated Contact with Patient 05/29/11 1337      Chief Complaint  Patient presents with  . Diarrhea    (Consider location/radiation/quality/duration/timing/severity/associated sxs/prior treatment) HPI  Past Medical History  Diagnosis Date  . Myocardial infarct   . Diabetes mellitus   . Hypertension   . CHF (congestive heart failure)   . Coronary artery disease   . Dysrhythmia     Past Surgical History  Procedure Date  . Coronary artery bypass graft   . Cholecystectomy     History reviewed. No pertinent family history.  History  Substance Use Topics  . Smoking status: Former Smoker -- 1.0 packs/day    Types: Cigarettes    Quit date: 05/08/1974  . Smokeless tobacco: Never Used  . Alcohol Use: Yes     occasionally      Review of Systems  Allergies  Morphine and related  Home Medications   Current Outpatient Rx  Name Route Sig Dispense Refill  . ACETAMINOPHEN 500 MG PO TABS Oral Take 1,000 mg by mouth at bedtime as needed. For pain     . ASPIRIN 81 MG PO TABS Oral Take 81 mg by mouth daily.      Marland Kitchen BISOPROLOL FUMARATE 5 MG PO TABS Oral Take 10 mg by mouth daily.      Marland Kitchen VITAMIN D 1000 UNITS PO TABS Oral Take 2,000 Units by mouth daily.      Marland Kitchen EZETIMIBE 10 MG PO TABS Oral Take 10 mg by mouth daily.      Marland Kitchen FINASTERIDE 5 MG PO TABS Oral Take 5 mg by mouth daily.      . OMEGA-3 FATTY ACIDS 1000 MG PO CAPS Oral Take 1 g by mouth daily.      . FUROSEMIDE 20 MG PO TABS Oral Take 20 mg by mouth daily.      Marland Kitchen GEMFIBROZIL 600 MG PO TABS Oral Take 600 mg by mouth 2 (two) times daily before a meal.      . GLYBURIDE 5 MG PO TABS Oral Take 1 tablet (5 mg total) by mouth 2 (two) times daily with a meal. 60 tablet 0  . GUAIFENESIN ER 600 MG PO TB12 Oral Take 1 tablet (600 mg total) by mouth 2 (two) times daily. 14 tablet 0  . HYDROCORTISONE 1 % EX CREA Topical Apply 1 application topically 2  (two) times daily. itchy back rash     . LEVOTHYROXINE SODIUM 25 MCG PO TABS Oral Take 25 mcg by mouth daily.      Marland Kitchen CALMOSEPTINE EX Apply externally Apply 1 application topically daily. Apply to buttocks every shift     . MIRTAZAPINE 7.5 MG PO TABS Oral Take 7.5 mg by mouth at bedtime.      Marland Kitchen NITROFURANTOIN MACROCRYSTAL 100 MG PO CAPS Oral Take 100 mg by mouth every 6 (six) hours. For 7 days for uti      . SACCHAROMYCES BOULARDII 250 MG PO CAPS Oral Take 250 mg by mouth 2 (two) times daily. Take for 14 days     . TAMSULOSIN HCL 0.4 MG PO CAPS Oral Take 0.4 mg by mouth daily.      . WARFARIN SODIUM 5 MG PO TABS Oral Take 5 mg by mouth. Takes 5mg  on Tues, Thurs, Fri, Sat, & Sun    . WARFARIN SODIUM 7.5 MG PO TABS Oral Take 7.5 mg by mouth  daily. Takes 7.5mg  on Mon, & Weds    . ZOLPIDEM TARTRATE 5 MG PO TABS Oral Take 5 mg by mouth at bedtime as needed. For insomnia       BP 111/60  Pulse 109  Temp(Src) 98.6 F (37 C) (Oral)  Resp 14  SpO2 94%  Physical Exam  ED Course  Procedures (including critical care time)  Labs Reviewed  CBC - Abnormal; Notable for the following:    WBC 13.1 (*)    RBC 3.86 (*)    Hemoglobin 12.4 (*)    HCT 37.4 (*)    All other components within normal limits  DIFFERENTIAL - Abnormal; Notable for the following:    Neutrophils Relative 84 (*)    Neutro Abs 10.9 (*)    Lymphocytes Relative 7 (*)    Monocytes Absolute 1.2 (*)    All other components within normal limits  COMPREHENSIVE METABOLIC PANEL - Abnormal; Notable for the following:    Potassium 3.1 (*)    Glucose, Bld 179 (*)    Albumin 2.7 (*)    GFR calc non Af Amer 70 (*)    GFR calc Af Amer 81 (*)    All other components within normal limits  PROTIME-INR - Abnormal; Notable for the following:    Prothrombin Time 58.6 (*) RESULT REPEATED AND VERIFIED   INR 6.60 (*)    All other components within normal limits  URINALYSIS, ROUTINE W REFLEX MICROSCOPIC - Abnormal; Notable for the following:     Ketones, ur 40 (*)    Leukocytes, UA SMALL (*)    All other components within normal limits  URINE MICROSCOPIC-ADD ON - Abnormal; Notable for the following:    Squamous Epithelial / LPF FEW (*)    All other components within normal limits  STOOL CULTURE  CLOSTRIDIUM DIFFICILE BY PCR  URINALYSIS, ROUTINE W REFLEX MICROSCOPIC   Dg Chest Portable 1 View  05/29/2011  *RADIOLOGY REPORT*  Clinical Data:  Weakness.  Unable to eat or drink.  PORTABLE CHEST - 1 VIEW  Comparison: 05/02/2011.  Findings: Cardiomegaly.  Mild vascular congestion but improved overall CHF.  Slight elevation left hemidiaphragm appears chronic. Retrocardiac density redemonstrated, possible infiltrate or scarring versus atelectasis.  IMPRESSION: Cardiomegaly.   Retrocardiac density could represent infiltrate versus scarring or atelectasis. This appears similar to priors. Overall the CHF is improved.  Original Report Authenticated By: Elsie Stain, M.D.     1. Diarrhea   2. C. difficile colitis       MDM  Care assumed from Dr. Preston Fleeting.  I agree with his history and physical as above.  I suspect CDiff due to his recent antibiotics.  Will consult medicine for admission.        Geoffery Lyons, MD 05/29/11 [phone]

## 2011-05-29 NOTE — ED Notes (Signed)
[id number] ready

## 2011-05-29 NOTE — Progress Notes (Signed)
ANTICOAGULATION CONSULT NOTE - Initial Consult  Pharmacy Consult for Coumadin Indication: atrial fibrillation (PAF)  Allergies  Allergen Reactions  . Morphine And Related Other (See Comments)    Patient Measurements: WT= 90.6KG (05/09/11) Ht= 73inches (05/09/11)     Vital Signs: Temp: 98.6 F (37 C) (12/17 1340) Temp src: Oral (12/17 1340) BP: 107/56 mmHg (12/17 2045) Pulse Rate: 107  (12/17 2045)  Labs:  Basename 05/29/11 1350  HGB 12.4*  HCT 37.4*  PLT 151  APTT --  LABPROT 58.6*  INR 6.60*  HEPARINUNFRC --  CREATININE 0.87  CKTOTAL --  CKMB --  TROPONINI --   The CrCl is unknown because both a height and weight (above a minimum accepted value) are required for this calculation.  Medical History: Past Medical History  Diagnosis Date  . Myocardial infarct   . Diabetes mellitus   . Hypertension   . CHF (congestive heart failure)   . Coronary artery disease   . Dysrhythmia     Atrial Fibrillation- on chronic coumadin  Medications:  Medication  Sig  Start Date  End Date  Taking?  Authorizing Provider  acetaminophen (TYLENOL) 500 MG tablet  Take 1,000 mg by mouth at bedtime as needed. For pain  Yes  Historical Provider, MD  aspirin 81 MG tablet  Take 81 mg by mouth daily.  Yes  Historical Provider, MD  bisoprolol (ZEBETA) 5 MG tablet  Take 10 mg by mouth daily.  Yes  Historical Provider, MD  cholecalciferol (VITAMIN D) 1000 UNITS tablet  Take 2,000 Units by mouth daily.  Yes  Historical Provider, MD  ezetimibe (ZETIA) 10 MG tablet  Take 10 mg by mouth daily.  Yes  Historical Provider, MD  finasteride (PROSCAR) 5 MG tablet  Take 5 mg by mouth daily.  Yes  Historical Provider, MD  fish oil-omega-3 fatty acids 1000 MG capsule  Take 1 g by mouth daily.  Yes  Historical Provider, MD  furosemide (LASIX) 20 MG tablet  Take 20 mg by mouth daily.  Yes  Historical Provider, MD  gemfibrozil (LOPID) 600 MG tablet  Take 600 mg by mouth 2  (two) times daily before a meal.  Yes  Historical Provider, MD  glyBURIDE (DIABETA) 5 MG tablet  Take 1 tablet (5 mg total) by mouth 2 (two) times daily with a meal.  05/16/11  Yes  Sosan Abdullah  guaiFENesin (MUCINEX) 600 MG 12 hr tablet  Take 1 tablet (600 mg total) by mouth 2 (two) times daily.  05/16/11  05/15/12  Yes  Sosan Abdullah  hydrocortisone cream 1 %  Apply 1 application topically 2 (two) times daily. itchy back rash  Yes  Historical Provider, MD  levothyroxine (SYNTHROID, LEVOTHROID) 25 MCG tablet  Take 25 mcg by mouth daily.  Yes  Historical Provider, MD  Menthol-Zinc Oxide (CALMOSEPTINE EX)  Apply 1 application topically daily. Apply to buttocks every shift  Yes  Historical Provider, MD  mirtazapine (REMERON) 7.5 MG tablet  Take 7.5 mg by mouth at bedtime.  Yes  Historical Provider, MD  nitrofurantoin (MACRODANTIN) 100 MG capsule Take 100 mg by mouth every 6 (six) hours. For 7 days for uti  Yes  Historical Provider, MD  saccharomyces boulardii (FLORASTOR) 250 MG capsule  Take 250 mg by mouth 2 (two) times daily. Take for 14 days  Yes  Historical Provider, MD  Tamsulosin HCl (FLOMAX) 0.4 MG CAPS  Take 0.4 mg by mouth daily.  Yes  Historical Provider, MD  warfarin (COUMADIN) 5  MG tablet  Take 5 mg by mouth. Takes 5mg  on Tues, Thurs, Fri, Sat, & Sun  (last taken 05/28/11) Yes  Historical Provider, MD  warfarin (COUMADIN) 7.5 MG tablet  Take 7.5 mg by mouth daily. Takes 7.5mg  on Mon, & Weds  Yes  Historical Provider, MD  zolpidem (AMBIEN) 5 MG tablet  Take 5 mg by mouth at bedtime as needed. For insomnia    Assessment: 77 yo male h/o PAF on coumadin 5mg  daily except 7.79m qMon & Wed. Admitted with nonbloody diarrhea worrisome for Cdiff colitis and UTI. Supratherapeutic INR today=6.6. Patient received Vitamin K 5mg  PO x 1 tonight in the ED. No bleeding reported.    Goal of Therapy:  INR 2-3   Plan:  No coumadin today.  Check INR in AM and daily.    David Davidson [facility] 05/29/2011,9:02 PM

## 2011-05-29 NOTE — ED Notes (Signed)
P/t arrived via ems-c/o poor appetite and diarrhea

## 2011-05-29 NOTE — ED Provider Notes (Signed)
History     CSN: [id number] Arrival date & time: 05/29/2011  1:34 PM   First MD Initiated Contact with Patient 05/29/11 1337      Chief Complaint  Patient presents with  . Diarrhea    (Consider location/radiation/quality/duration/timing/severity/associated sxs/prior treatment) Patient is a 77 y.o. male presenting with diarrhea. The history is provided by the patient.  Diarrhea The primary symptoms include diarrhea.   77 year old male has had diarrhea for the last 2 weeks. He has had several courses of antibiotics for various infections including pneumonia. He states that the diarrhea is not particularly malodorous. He has not had any fever or chills or nausea or vomiting. He states he has not been able to eat because of difficulty swallowing. He is able to chew adequately but can't transfer the food from his mouth to his esophagus. He denies abdominal pain. He is complaining of being weak. Paperwork from nursing home states a stool C. Difficile test had been ordered but it's unclear if that it actually been sent. Past Medical History  Diagnosis Date  . Myocardial infarct   . Diabetes mellitus   . Hypertension   . CHF (congestive heart failure)   . Coronary artery disease   . Dysrhythmia     Past Surgical History  Procedure Date  . Coronary artery bypass graft   . Cholecystectomy     History reviewed. No pertinent family history.  History  Substance Use Topics  . Smoking status: Former Smoker -- 1.0 packs/day    Types: Cigarettes    Quit date: 05/08/1974  . Smokeless tobacco: Never Used  . Alcohol Use: Yes     occasionally      Review of Systems  Gastrointestinal: Positive for diarrhea.  All other systems reviewed and are negative.    Allergies  Morphine and related  Home Medications   Current Outpatient Rx  Name Route Sig Dispense Refill  . ASPIRIN 81 MG PO TABS Oral Take 81 mg by mouth daily.      Marland Kitchen BISOPROLOL FUMARATE 5 MG PO TABS Oral Take 10 mg by  mouth daily.      Marland Kitchen VITAMIN D 1000 UNITS PO TABS Oral Take 2,000 Units by mouth daily.      Marland Kitchen EZETIMIBE 10 MG PO TABS Oral Take 10 mg by mouth daily.      Marland Kitchen FINASTERIDE 5 MG PO TABS Oral Take 5 mg by mouth daily.      . OMEGA-3 FATTY ACIDS 1000 MG PO CAPS Oral Take 1 g by mouth daily.      . FUROSEMIDE 20 MG PO TABS Oral Take 20 mg by mouth daily.      Marland Kitchen GEMFIBROZIL 600 MG PO TABS Oral Take 600 mg by mouth 2 (two) times daily before a meal.      . GLYBURIDE 5 MG PO TABS Oral Take 1 tablet (5 mg total) by mouth 2 (two) times daily with a meal. 60 tablet 0  . GUAIFENESIN ER 600 MG PO TB12 Oral Take 1 tablet (600 mg total) by mouth 2 (two) times daily. 14 tablet 0  . LEVOTHYROXINE SODIUM 25 MCG PO TABS Oral Take 25 mcg by mouth daily.      Marland Kitchen TAMSULOSIN HCL 0.4 MG PO CAPS Oral Take 0.4 mg by mouth daily.      . WARFARIN SODIUM 5 MG PO TABS Oral Take 5 mg by mouth daily. Mon, Thurs, Sat    . WARFARIN SODIUM 7.5 MG PO TABS Oral Take  7.5 mg by mouth daily. Tues, Wed, Fri, Sun       BP 121/69  Pulse 109  Temp(Src) 98.6 F (37 C) (Oral)  Resp 16  SpO2 97%  Physical Exam  Nursing note and vitals reviewed. 77 year old male who appears somewhat cachectic and is in no acute distress. Vital signs are significant for mild tachycardia with heart rate 109. Oxygen saturation is a satisfactory 97% on room air. Head is normocephalic and atraumatic. PERRLA, EOMI. Eyes are slightly sunken. Mucous membranes are dry. No pharyngeal erythema is seen. Neck is supple without adenopathy or JVD. Back is nontender. Lungs are clear without any rales, wheezes, rhonchi. Heart has regular rate and rhythm which is tachycardic, 2-3/6 systolic ejection murmur is heard along the left sternal border with radiation to the apex. Abdomen is soft and nontender. It is slightly scaphoid. No masses or hepatosplenomegaly are noted. Extremities have no cyanosis or edema. Skin has decreased turgor, no rash. Neurologic: Mental status is  normal, cranial nerves are intact, there no focal motor or sensory deficits.  ED Course  Procedures (including critical care time)   Labs Reviewed  CBC  DIFFERENTIAL  COMPREHENSIVE METABOLIC PANEL  URINALYSIS, ROUTINE W REFLEX MICROSCOPIC   No results found. Results for orders placed during the hospital encounter of 05/29/11  CBC      Component Value Range   WBC 13.1 (*) 4.0 - 10.5 (K/uL)   RBC 3.86 (*) 4.22 - 5.81 (MIL/uL)   Hemoglobin 12.4 (*) 13.0 - 17.0 (g/dL)   HCT 09.8 (*) 11.9 - 52.0 (%)   MCV 96.9  78.0 - 100.0 (fL)   MCH 32.1  26.0 - 34.0 (pg)   MCHC 33.2  30.0 - 36.0 (g/dL)   RDW 14.7  82.9 - 56.2 (%)   Platelets 151  150 - 400 (K/uL)  DIFFERENTIAL      Component Value Range   Neutrophils Relative 84 (*) 43 - 77 (%)   Neutro Abs 10.9 (*) 1.7 - 7.7 (K/uL)   Lymphocytes Relative 7 (*) 12 - 46 (%)   Lymphs Abs 0.9  0.7 - 4.0 (K/uL)   Monocytes Relative 9  3 - 12 (%)   Monocytes Absolute 1.2 (*) 0.1 - 1.0 (K/uL)   Eosinophils Relative 1  0 - 5 (%)   Eosinophils Absolute 0.1  0.0 - 0.7 (K/uL)   Basophils Relative 0  0 - 1 (%)   Basophils Absolute 0.0  0.0 - 0.1 (K/uL)  COMPREHENSIVE METABOLIC PANEL      Component Value Range   Sodium 143  135 - 145 (mEq/L)   Potassium 3.1 (*) 3.5 - 5.1 (mEq/L)   Chloride 106  96 - 112 (mEq/L)   CO2 27  19 - 32 (mEq/L)   Glucose, Bld 179 (*) 70 - 99 (mg/dL)   BUN 16  6 - 23 (mg/dL)   Creatinine, Ser 1.30  0.50 - 1.35 (mg/dL)   Calcium 8.8  8.4 - 86.5 (mg/dL)   Total Protein 7.2  6.0 - 8.3 (g/dL)   Albumin 2.7 (*) 3.5 - 5.2 (g/dL)   AST 22  0 - 37 (U/L)   ALT 15  0 - 53 (U/L)   Alkaline Phosphatase 62  39 - 117 (U/L)   Total Bilirubin 0.4  0.3 - 1.2 (mg/dL)   GFR calc non Af Amer 70 (*) >90 (mL/min)   GFR calc Af Amer 81 (*) >90 (mL/min)  PROTIME-INR      Component Value Range  Prothrombin Time 58.6 (*) 11.6 - 15.2 (seconds)   INR 6.60 (*) 0.00 - 1.49    Dg Chest 2 View  05/10/2011  *RADIOLOGY REPORT*  Clinical Data:  Pneumonia, congestive heart failure  CHEST - 2 VIEW  Comparison: Chest radiograph 05/09/2011  Findings: Sternotomy wires overlie stable enlarged heart silhouette.  There is mild air space disease at the lung bases. There are small bilateral pleural effusions.  No pneumothorax.  No focal consolidation.  No acute osseous abnormality.  IMPRESSION:  1.  Cardiomegaly and mild air space disease suggests mild congestive heart failure. 2.  Small bilateral pleural effusions.  Original Report Authenticated By: Genevive Bi, M.D.   Dg Chest 2 View  05/09/2011  *RADIOLOGY REPORT*  Clinical Data: Cough, shortness of breath and fever.  CHEST - 2 VIEW  Comparison: Chest radiograph performed 10/31/2008  Findings: The lungs are well-aerated.  Streaky bilateral airspace opacities, most prominent at the mid lung zones bilaterally and left lung base, raise suspicion for pneumonia.  Mild underlying vascular congestion is again noted.  There is no evidence of significant pleural effusion or pneumothorax.  The heart is mildly enlarged; the patient is status post median sternotomy, with evidence of prior CABG.  No acute osseous abnormalities are seen.  IMPRESSION:  1.  Streaky bilateral airspace opacities, most prominent at the midlung zones bilaterally and at the left lung base, raise suspicion for pneumonia. 2.  Mild vascular congestion and mild cardiomegaly again noted.  Original Report Authenticated By: Tonia Ghent, M.D.   Ct Head Wo Contrast  05/14/2011  *RADIOLOGY REPORT*  Clinical Data: 77 year male with confusion.  CT HEAD WITHOUT CONTRAST  Technique:  Contiguous axial images were obtained from the base of the skull through the vertex without contrast.  Comparison: None.  Findings: There is cerebral atrophy, particularly involving the frontal lobes.  No evidence for acute hemorrhage, mass lesion, midline shift, hydrocephalus or large infarct.  Diffuse calcifications involving the falx.  There is fluid in the right  maxillary sinus and mucosal thickening and possibly fluid in left maxillary sinus.  Diffuse opacification of the ethmoid air cells. No acute bony abnormality.  IMPRESSION: No acute intracranial abnormality.  Paranasal sinus disease.  Fluid in the right maximal sinus could represent acute sinusitis.  Original Report Authenticated By: Richarda Overlie, M.D.   Dg Chest Portable 1 View  05/29/2011  *RADIOLOGY REPORT*  Clinical Data:  Weakness.  Unable to eat or drink.  PORTABLE CHEST - 1 VIEW  Comparison: 05/02/2011.  Findings: Cardiomegaly.  Mild vascular congestion but improved overall CHF.  Slight elevation left hemidiaphragm appears chronic. Retrocardiac density redemonstrated, possible infiltrate or scarring versus atelectasis.  IMPRESSION: Cardiomegaly.   Retrocardiac density could represent infiltrate versus scarring or atelectasis. This appears similar to priors. Overall the CHF is improved.  Original Report Authenticated By: Elsie Stain, M.D.      No diagnosis found.  Patient was given IV hydration. Laboratory workup does not show significant dehydration. He continues to be mildly tachycardic. Coagulopathy is treated with oral vitamin K. He does not need fresh frozen plasma at this time since he is not showing any active bleeding. He may need to be admitted for ongoing IV hydration given his ongoing tachycardia, and he wanted to have his INR rechecked in the morning. He is endorsed to Dr. Judd Lien for evaluation of response to hydration and consideration for admission.  MDM  Diarrhea with dehydration. Stool be sent for culture and evaluation for possible C. Difficile  infection.        Dione Booze, MD 05/29/11 [phone]

## 2011-05-29 NOTE — ED Notes (Signed)
Patient reported to have pneumonia 3 weeks ago.  He has completed antibiotic tx.  He has been at [facility] place for the past 10 days.  Patient has had nausea and diarrhea for the past 1 week.  Patient denies abd pain.  Denies any pain when voiding.  Patient has lost over 20 pounds since onset of sx.  Family at bedside.  Orders noted.

## 2011-05-29 NOTE — ED Notes (Signed)
Pt. Alert and oriented, pt. Denies pain, episode of diarrhea spec. sent

## 2011-05-29 NOTE — H&P (Addendum)
PCP:  [name], MD, MD - [facility]  DOA:  05/29/2011  1:34 PM  Pt is 77 yo male with PMH outlined below who presents to [facility] ED with main concern of progressively worsening, non bloody, mostly watery diarrhea that initially started approximately 2 weeks prior to admission. He tells me that he was recently admitted to the hospital 05/09/2011 for PNA and was treated with antibiotics at that time. In addition, at SNF he was told he has UTI and was started on another antibiotic. His diarrhea is not getting any better and he denies any specific aggravating or alleviating factors. He also denies similar episodes in the past. He denies fevers, chills, any specific abdominal concerns except generalized abdominal discomfort mostly in lower quadrants, has mild dysuria and no other urinary concerns.Pt denies other systemic concerns except poor oral intake.  Allergies: Allergies  Allergen Reactions  . Morphine And Related Other (See Comments)    Prior to Admission medications   Medication Sig Start Date End Date Taking? Authorizing Provider  acetaminophen (TYLENOL) 500 MG tablet Take 1,000 mg by mouth at bedtime as needed. For pain    Yes Historical Provider, MD  aspirin 81 MG tablet Take 81 mg by mouth daily.     Yes Historical Provider, MD  bisoprolol (ZEBETA) 5 MG tablet Take 10 mg by mouth daily.     Yes Historical Provider, MD  cholecalciferol (VITAMIN D) 1000 UNITS tablet Take 2,000 Units by mouth daily.     Yes Historical Provider, MD  ezetimibe (ZETIA) 10 MG tablet Take 10 mg by mouth daily.     Yes Historical Provider, MD  finasteride (PROSCAR) 5 MG tablet Take 5 mg by mouth daily.     Yes Historical Provider, MD  fish oil-omega-3 fatty acids 1000 MG capsule Take 1 g by mouth daily.     Yes Historical Provider, MD  furosemide (LASIX) 20 MG tablet Take 20 mg by mouth daily.     Yes Historical Provider, MD  gemfibrozil (LOPID) 600 MG tablet Take 600 mg by mouth 2 (two) times  daily before a meal.     Yes Historical Provider, MD  glyBURIDE (DIABETA) 5 MG tablet Take 1 tablet (5 mg total) by mouth 2 (two) times daily with a meal. 05/16/11  Yes Sosan Abdullah  guaiFENesin (MUCINEX) 600 MG 12 hr tablet Take 1 tablet (600 mg total) by mouth 2 (two) times daily. 05/16/11 05/15/12 Yes Sosan Abdullah  hydrocortisone cream 1 % Apply 1 application topically 2 (two) times daily. itchy back rash    Yes Historical Provider, MD  levothyroxine (SYNTHROID, LEVOTHROID) 25 MCG tablet Take 25 mcg by mouth daily.     Yes Historical Provider, MD  Menthol-Zinc Oxide (CALMOSEPTINE EX) Apply 1 application topically daily. Apply to buttocks every shift    Yes Historical Provider, MD  mirtazapine (REMERON) 7.5 MG tablet Take 7.5 mg by mouth at bedtime.     Yes Historical Provider, MD  nitrofurantoin (MACRODANTIN) 100 MG capsule Take 100 mg by mouth every 6 (six) hours. For 7 days for uti     Yes Historical Provider, MD  saccharomyces boulardii (FLORASTOR) 250 MG capsule Take 250 mg by mouth 2 (two) times daily. Take for 14 days    Yes Historical Provider, MD  Tamsulosin HCl (FLOMAX) 0.4 MG CAPS Take 0.4 mg by mouth daily.     Yes Historical Provider, MD  warfarin (COUMADIN) 5 MG tablet Take 5 mg by mouth. Takes 5mg  on Tues, Thurs,  Fri, Sat, & Sun   Yes Historical Provider, MD  warfarin (COUMADIN) 7.5 MG tablet Take 7.5 mg by mouth daily. Takes 7.5mg  on Mon, & Weds   Yes Historical Provider, MD  zolpidem (AMBIEN) 5 MG tablet Take 5 mg by mouth at bedtime as needed. For insomnia    Yes Historical Provider, MD    Past Medical History  Diagnosis Date  . Myocardial infarct   . Diabetes mellitus   . Hypertension   . CHF (congestive heart failure)   . Coronary artery disease   . Dysrhythmia     Past Surgical History  Procedure Date  . Coronary artery bypass graft   . Cholecystectomy     Social History:  reports that he quit smoking about 37 years ago. His smoking use included Cigarettes. He  smoked 1 pack per day. He has never used smokeless tobacco. He reports that he drinks alcohol. He reports that he does not use illicit drugs.  History reviewed. No pertinent family history.  Review of Systems:  Constitutional: Denies fever, chills, diaphoresis, and fatigue.  HEENT: Denies photophobia, eye pain, redness, hearing loss, ear pain, congestion, sore throat, rhinorrhea, sneezing, mouth sores, trouble swallowing, neck pain, neck stiffness and tinnitus.   Respiratory: Denies SOB, DOE, cough, chest tightness,  and wheezing.   Cardiovascular: Denies chest pain, palpitations and leg swelling.  Gastrointestinal: Denies nausea, constipation, blood in stool and abdominal distention.  Genitourinary: Denies urgency, frequency, hematuria, flank pain and difficulty urinating.  Musculoskeletal: Denies myalgias, back pain, joint swelling, arthralgias and gait problem.  Skin: Denies pallor, rash and wound.  Neurological: Denies dizziness, seizures, syncope, weakness, light-headedness, numbness and headaches.  Hematological: Denies adenopathy. Easy bruising, personal or family bleeding history  Psychiatric/Behavioral: Denies suicidal ideation, mood changes, confusion, nervousness, sleep disturbance and agitation  Physical Exam:  Filed Vitals:   05/29/11 1700 05/29/11 1850 05/29/11 1915 05/29/11 1930  BP: 108/62 111/60 106/58 120/60  Pulse: 108 109 108 108  Temp:      TempSrc:      Resp:  14 23 24   SpO2:  94% 96% 97%    Constitutional: Vital signs reviewed.  Patient is in no acute distress and cooperative with exam. Alert and oriented x3.  Head: Normocephalic and atraumatic Ear: TM normal bilaterally Mouth: no erythema or exudates, dry mucous membranes Eyes: PERRL, EOMI, conjunctivae normal, No scleral icterus.  Neck: Supple, Trachea midline normal ROM, No JVD, mass, thyromegaly, or carotid bruit present.  Cardiovascular: Irregular rate and rhythm, SEM 2/6, pulses symmetric and intact  bilaterally Pulmonary/Chest: CTAB with minimal bibasilar crackles, no wheezes, rales, or rhonchi Abdominal: Soft. Left lower quadrant tenderness, non-distended, bowel sounds are normal, no masses, organomegaly, or guarding present.  GU: no CVA tenderness Musculoskeletal: No joint deformities, erythema, or stiffness, ROM full and no nontender Ext: no edema and no cyanosis, pulses palpable bilaterally (DP and PT) Hematology: no cervical, inginal, or axillary adenopathy.  Neurological: A&O x3, Strenght is normal and symmetric bilaterally, cranial nerve II-XII are grossly intact, no focal motor deficit, sensory intact to light touch bilaterally.  Skin: Warm, dry and intact. No rash, cyanosis, or clubbing.  Psychiatric: Normal mood and affect. speech and behavior is normal. Judgment and thought content normal. Cognition and memory are normal.   Labs on Admission:  Results for orders placed during the hospital encounter of 05/29/11 (from the past 48 hour(s))  CBC     Status: Abnormal   Collection Time   05/29/11  1:50 PM  Component Value Range Comment   WBC 13.1 (*) 4.0 - 10.5 (K/uL)    RBC 3.86 (*) 4.22 - 5.81 (MIL/uL)    Hemoglobin 12.4 (*) 13.0 - 17.0 (g/dL)    HCT 16.1 (*) 09.6 - 52.0 (%)    MCV 96.9  78.0 - 100.0 (fL)    MCH 32.1  26.0 - 34.0 (pg)    MCHC 33.2  30.0 - 36.0 (g/dL)    RDW 04.5  40.9 - 81.1 (%)    Platelets 151  150 - 400 (K/uL)   DIFFERENTIAL     Status: Abnormal   Collection Time   05/29/11  1:50 PM      Component Value Range Comment   Neutrophils Relative 84 (*) 43 - 77 (%)    Neutro Abs 10.9 (*) 1.7 - 7.7 (K/uL)    Lymphocytes Relative 7 (*) 12 - 46 (%)    Lymphs Abs 0.9  0.7 - 4.0 (K/uL)    Monocytes Relative 9  3 - 12 (%)    Monocytes Absolute 1.2 (*) 0.1 - 1.0 (K/uL)    Eosinophils Relative 1  0 - 5 (%)    Eosinophils Absolute 0.1  0.0 - 0.7 (K/uL)    Basophils Relative 0  0 - 1 (%)    Basophils Absolute 0.0  0.0 - 0.1 (K/uL)   COMPREHENSIVE METABOLIC  PANEL     Status: Abnormal   Collection Time   05/29/11  1:50 PM      Component Value Range Comment   Sodium 143  135 - 145 (mEq/L)    Potassium 3.1 (*) 3.5 - 5.1 (mEq/L)    Chloride 106  96 - 112 (mEq/L)    CO2 27  19 - 32 (mEq/L)    Glucose, Bld 179 (*) 70 - 99 (mg/dL)    BUN 16  6 - 23 (mg/dL)    Creatinine, Ser 9.14  0.50 - 1.35 (mg/dL)    Calcium 8.8  8.4 - 10.5 (mg/dL)    Total Protein 7.2  6.0 - 8.3 (g/dL)    Albumin 2.7 (*) 3.5 - 5.2 (g/dL)    AST 22  0 - 37 (U/L)    ALT 15  0 - 53 (U/L)    Alkaline Phosphatase 62  39 - 117 (U/L)    Total Bilirubin 0.4  0.3 - 1.2 (mg/dL)    GFR calc non Af Amer 70 (*) >90 (mL/min)    GFR calc Af Amer 81 (*) >90 (mL/min)   PROTIME-INR     Status: Abnormal   Collection Time   05/29/11  1:50 PM      Component Value Range Comment   Prothrombin Time 58.6 (*) 11.6 - 15.2 (seconds) RESULT REPEATED AND VERIFIED   INR 6.60 (*) 0.00 - 1.49    URINALYSIS, ROUTINE W REFLEX MICROSCOPIC     Status: Abnormal   Collection Time   05/29/11  5:09 PM      Component Value Range Comment   Color, Urine YELLOW  YELLOW     APPearance CLEAR  CLEAR     Specific Gravity, Urine 1.017  1.005 - 1.030     pH 5.5  5.0 - 8.0     Glucose, UA NEGATIVE  NEGATIVE (mg/dL)    Hgb urine dipstick NEGATIVE  NEGATIVE     Bilirubin Urine NEGATIVE  NEGATIVE     Ketones, ur 40 (*) NEGATIVE (mg/dL)    Protein, ur NEGATIVE  NEGATIVE (mg/dL)    Urobilinogen, UA  0.2  0.0 - 1.0 (mg/dL)    Nitrite NEGATIVE  NEGATIVE     Leukocytes, UA SMALL (*) NEGATIVE    URINE MICROSCOPIC-ADD ON     Status: Abnormal   Collection Time   05/29/11  5:09 PM      Component Value Range Comment   Squamous Epithelial / LPF FEW (*) RARE     WBC, UA 3-6  <3 (WBC/hpf)    RBC / HPF 0-2  <3 (RBC/hpf)    Urine-Other MUCOUS PRESENT      Radiological Exams on Admission: 05/29/2011 - CXR - Cardiomegaly. Retrocardiac density could represent infiltrate versus scarring or atelectasis. This appears similar to  priors. Overall the CHF is improved.  Assessment/Plan  Principal Problem:  *Diarrhea - based on symptoms and recent history of multiple antibiotics use, certainly most worrisome for C. Diff colitis - will admit the patient to regular floor for further evaluation and management - will provide supportive care with IVF, Flagyl IV Q8 hours with Cipro IV - Cipro can be d/c based on results of UA/Urine Cultures - continue to obtain BMP in AM and supplement electrolytes if indicated (potassium) - give imodium to provide symptomatic relief - continue anti - emetics, analgesic for adequate pain control, anti - pyretic for temperature control - follow up on C. Diff by PCR and stool cultures  Active Problems:  Paroxysmal a-fib - supra therapeutic INR - pt has receive one dose of Vit K in ED - coumadin per pharmacy - obtain daily coag's   Hypokalemia - secondary to diarrhea - check magnesium level - supplement as indicated   Leukocytosis - likely secondary to principal problem - follow up on CBC in AM   Diabetes mellitus - check A1C - continue SSI, sensitive coverage for now   Hypertension - currently at goal   Hyperlipidemia - check lipid panel   Community acquired bacterial pneumonia - recently treated with antibiotics - this appears to be resolved at this point   UTI (lower urinary tract infection) - at SNF pt told he has UTI - will repeat UA, Urine cultures with sensitivity - continue Cipro for now   Hypothyroidism - check TSH - continue home dose synthroid   Disposition - plan of care and diagnosis, diagnostic studies and test results were discussed with pt and family at bedside - pt and  family verbalized understanding  Time Spent on Admission: Over 30 minutes  MAGICK-Atom Solivan 05/29/2011, 8:07 PM  [facility] Pager [phone]

## 2011-05-29 NOTE — ED Notes (Signed)
Paged [facility] To Dr. Judd Lien

## 2011-05-30 LAB — PROTIME-INR: Prothrombin Time: 38.7 seconds — ABNORMAL HIGH (ref 11.6–15.2)

## 2011-05-30 LAB — HEMOGLOBIN A1C
Hgb A1c MFr Bld: 7.1 % — ABNORMAL HIGH (ref <5.7)
Mean Plasma Glucose: 157 mg/dL — ABNORMAL HIGH (ref <117)

## 2011-05-30 LAB — GLUCOSE, CAPILLARY
Glucose-Capillary: 177 mg/dL — ABNORMAL HIGH (ref 70–99)
Glucose-Capillary: 161 mg/dL — ABNORMAL HIGH (ref 70–99)
Glucose-Capillary: 181 mg/dL — ABNORMAL HIGH (ref 70–99)

## 2011-05-30 LAB — BASIC METABOLIC PANEL WITH GFR
BUN: 11 mg/dL (ref 6–23)
CO2: 24 meq/L (ref 19–32)
Calcium: 8.1 mg/dL — ABNORMAL LOW (ref 8.4–10.5)
Chloride: 113 meq/L — ABNORMAL HIGH (ref 96–112)
Creatinine, Ser: 0.77 mg/dL (ref 0.50–1.35)
GFR calc Af Amer: 85 mL/min — ABNORMAL LOW (ref >90)
GFR calc non Af Amer: 73 mL/min — ABNORMAL LOW (ref >90)
Glucose, Bld: 160 mg/dL — ABNORMAL HIGH (ref 70–99)
Potassium: 2.8 meq/L — ABNORMAL LOW (ref 3.5–5.1)
Sodium: 145 meq/L (ref 135–145)

## 2011-05-30 LAB — CBC
HCT: 34.5 % — ABNORMAL LOW (ref 39.0–52.0)
Hemoglobin: 11.1 g/dL — ABNORMAL LOW (ref 13.0–17.0)
MCH: 31.3 pg (ref 26.0–34.0)
MCHC: 32.2 g/dL (ref 30.0–36.0)
MCV: 97.2 fL (ref 78.0–100.0)
Platelets: 139 K/uL — ABNORMAL LOW (ref 150–400)
RBC: 3.55 MIL/uL — ABNORMAL LOW (ref 4.22–5.81)
RDW: 14 % (ref 11.5–15.5)
WBC: 11.3 K/uL — ABNORMAL HIGH (ref 4.0–10.5)

## 2011-05-30 LAB — APTT: aPTT: 75 seconds — ABNORMAL HIGH (ref 24–37)

## 2011-05-30 MED ORDER — GLUCERNA SHAKE PO LIQD
237.0000 mL | Freq: Three times a day (TID) | ORAL | Status: DC
  Administered 2011-06-01 – 2011-06-02 (×2): 237 mL via ORAL
  Filled 2011-05-30: qty 237

## 2011-05-30 MED ORDER — POTASSIUM CHLORIDE CRYS ER 20 MEQ PO TBCR
40.0000 meq | EXTENDED_RELEASE_TABLET | Freq: Once | ORAL | Status: AC
  Administered 2011-05-30: 40 meq via ORAL
  Filled 2011-05-30 (×2): qty 2

## 2011-05-30 MED ORDER — METRONIDAZOLE 500 MG PO TABS
500.0000 mg | ORAL_TABLET | Freq: Three times a day (TID) | ORAL | Status: DC
  Administered 2011-05-30: 500 mg via ORAL
  Filled 2011-05-30 (×4): qty 1

## 2011-05-30 MED ORDER — POTASSIUM CHLORIDE IN NACL 40-0.9 MEQ/L-% IV SOLN
INTRAVENOUS | Status: DC
  Filled 2011-05-30 (×2): qty 1000

## 2011-05-30 MED ORDER — VANCOMYCIN 50 MG/ML ORAL SOLUTION
125.0000 mg | Freq: Four times a day (QID) | ORAL | Status: DC
  Administered 2011-05-30 – 2011-06-02 (×12): 125 mg via ORAL
  Filled 2011-05-30 (×16): qty 2.5

## 2011-05-30 MED ORDER — SODIUM CHLORIDE 0.45 % IV SOLN
INTRAVENOUS | Status: DC
  Administered 2011-05-30 – 2011-05-31 (×2): via INTRAVENOUS
  Filled 2011-05-30 (×4): qty 1000

## 2011-05-30 NOTE — Progress Notes (Signed)
05/30/2011 [facility] Case Management Note [phone]   Utilization review completed.  

## 2011-05-30 NOTE — Progress Notes (Addendum)
INITIAL ADULT NUTRITION ASSESSMENT Date: 05/30/2011   Time: 11:23 AM  Reason for Assessment: Nutrition Risk Report  ASSESSMENT: Male 77 y.o.  Dx: Diarrhea  Hx:  Past Medical History  Diagnosis Date  . Myocardial infarct   . Diabetes mellitus   . Hypertension   . CHF (congestive heart failure)   . Coronary artery disease   . Dysrhythmia     Related Meds:     . sodium chloride   Intravenous Once  . aspirin EC  81 mg Oral Daily  . bisoprolol  10 mg Oral Daily  . ezetimibe  10 mg Oral Daily  . finasteride  5 mg Oral Daily  . gemfibrozil  600 mg Oral BID AC  . guaiFENesin  600 mg Oral BID  . hydrocortisone cream  1 application Topical BID  . insulin aspart  0-9 Units Subcutaneous Q4H  . levothyroxine  25 mcg Oral QAC breakfast  . loperamide  2 mg Oral BID  . metronidazole  500 mg Intravenous Once  . metroNIDAZOLE  500 mg Oral Q8H  . mirtazapine  7.5 mg Oral QHS  . phytonadione  5 mg Oral Once  . potassium chloride  40 mEq Oral Daily  . saccharomyces boulardii  250 mg Oral BID  . sodium chloride  1,000 mL Intravenous Once  . Tamsulosin HCl  0.4 mg Oral QHS  . white petrolatum-corn starch-lanolin   Topical Daily  . DISCONTD: sodium chloride   Intravenous STAT  . DISCONTD: ciprofloxacin  400 mg Intravenous Once  . DISCONTD: ciprofloxacin  400 mg Intravenous Q12H  . DISCONTD: enoxaparin  40 mg Subcutaneous Q24H  . DISCONTD: furosemide  20 mg Oral Daily  . DISCONTD: Menthol-Zinc Oxide  1 Tube Topical Daily  . DISCONTD: metronidazole  500 mg Intravenous Q8H  . DISCONTD: Tamsulosin HCl  0.4 mg Oral QHS    Ht: 185.4 cm (6'1)  Wt: 90.6 kg (199 lb) -- 05/17/11 -- no new weight this admission  Ideal Wt: 83.6 kg  % Ideal Wt: 108%  Usual Wt: unknown % Usual Wt: ---  BMI = 26.4 kg/m2  Food/Nutrition Related Hx: unintentional weight loss greater than 10 lbs within the past month per nutrition screen  Labs:  CMP     Component Value Date/Time   NA 145 05/30/2011 0550    K 2.8* 05/30/2011 0550   CL 113* 05/30/2011 0550   CO2 24 05/30/2011 0550   GLUCOSE 160* 05/30/2011 0550   BUN 11 05/30/2011 0550   CREATININE 0.77 05/30/2011 0550   CALCIUM 8.1* 05/30/2011 0550   PROT 7.2 05/29/2011 1350   ALBUMIN 2.7* 05/29/2011 1350   AST 22 05/29/2011 1350   ALT 15 05/29/2011 1350   ALKPHOS 62 05/29/2011 1350   BILITOT 0.4 05/29/2011 1350   GFRNONAA 73* 05/30/2011 0550   GFRAA 85* 05/30/2011 0550    I/O last 3 completed shifts: In: -  Out: 25 [Urine:25]    Diet Order: Full Liquid  Supplements/Tube Feeding: N/A  IVF:    sodium chloride Last Rate: 75 mL/hr at 05/29/11 2347  DISCONTD: sodium chloride     Estimated Nutritional Needs:   Kcal:1,800-2,000 Protein: 95-105 gm Fluid: 1.8-2.0 L  RD spoke with pt's family re: nutrition hx -- states his appetite has been poor PTA and at present; report a 20 lb weight loss x 3 weeks; meets criteria for malnutrition; pt would benefit from addition of supplement -- likes Glucerna Shake -- RD to order.  NUTRITION DIAGNOSIS: -Malnutrition (NI-5.2).  Status: Ongoing  RELATED TO: inadequate oral intake  AS EVIDENCE BY: < 50% intake of estimated energy requirement for > 5 days, 10% weight loss  MONITORING/EVALUATION(Goals): Goal: meet >90% of estimated nutrition needs to prevent further weight loss Monitor: PO intake, weight, labs, I/O's  EDUCATION NEEDS: -No education needs identified at this time  INTERVENTION:  Add Glucerna Shake supplement PO TID (220 kcals, 9.9 gm protein per 8 fl oz bottle)  RD to follow for nutrition care plan  Dietitian #: [phone]  DOCUMENTATION CODES Per approved criteria  -Severe malnutrition in the context of acute illness or injury    Alger Memos 05/30/2011, 11:23 AM

## 2011-05-30 NOTE — Progress Notes (Addendum)
Subjective: Chart reviewed. Patient gives history of progressively worsening diarrhea for 2 weeks. Has been on antibiotics for pneumonia and urinary tract infection. Today has had one stool since this morning. Wall appears to poor appetite.? Difficulty swallowing. Denies abdominal pain, cough, dyspnea, chest pain, dysuria.  Objective: Blood pressure 133/64, pulse 89, temperature 98.7 F (37.1 C), temperature source Oral, resp. rate 20, SpO2 96.00%.  Intake/Output Summary (Last 24 hours) at 05/30/11 1716 Last data filed at 05/30/11 1500  Gross per 24 hour  Intake 1141.25 ml  Output      0 ml  Net 1141.25 ml   General exam: Elderly male who looks younger than her stated age. Mucosa is dry. Respiratory system: Clear. No increased work of breathing. Crevasses system: First and second heart sounds heard, regular. No JVD. Gastrointestinal system: Abdomen is nondistended, soft and normal bowel sounds heard. Nontender. Central system: Alert and oriented. No focal neurological deficits. Extremities: Symmetrical 5 x 5 power.  Lab Results: Basic Metabolic Panel:  Basename 05/30/11 0550 05/29/11 2047 05/29/11 1350  NA 145 -- 143  K 2.8* -- 3.1*  CL 113* -- 106  CO2 24 -- 27  GLUCOSE 160* -- 179*  BUN 11 -- 16  CREATININE 0.77 -- 0.87  CALCIUM 8.1* -- 8.8  MG -- 2.2 --  PHOS -- 2.6 --   Liver Function Tests:  Basename 05/29/11 1350  AST 22  ALT 15  ALKPHOS 62  BILITOT 0.4  PROT 7.2  ALBUMIN 2.7*   CBC:  Basename 05/30/11 0550 05/29/11 1350  WBC 11.3* 13.1*  NEUTROABS -- 10.9*  HGB 11.1* 12.4*  HCT 34.5* 37.4*  MCV 97.2 96.9  PLT 139* 151   CBG:  Basename 05/30/11 1139 05/30/11 0406 05/29/11 2329  GLUCAP 181* 161* 177*   Hemoglobin A1C:  Basename 05/29/11 2047  HGBA1C 7.1*   Fasting Lipid Panel:  Basename 05/29/11 2047  CHOL 122  HDL 18*  LDLCALC 77  TRIG 161  CHOLHDL 6.8  LDLDIRECT --   Thyroid Function Tests:  Basename 05/29/11 2047  TSH 0.828    T4TOTAL --  FREET4 --  T3FREE --  THYROIDAB --   Anemia Panel: No results found for this basename: VITAMINB12,FOLATE,FERRITIN,TIBC,IRON,RETICCTPCT in the last 72 hours Coagulation:  Basename 05/30/11 0550 05/29/11 1350  LABPROT 38.7* 58.6*  INR 3.89* 6.60*     Micro Results: Recent Results (from the past 240 hour(s))  STOOL CULTURE     Status: Normal (Preliminary result)   Collection Time   05/29/11  7:37 PM      Component Value Range Status Comment   Specimen Description STOOL   Final    Special Requests NONE   Final    Culture Culture reincubated for better growth   Final    Report Status PENDING   Incomplete   CLOSTRIDIUM DIFFICILE BY PCR     Status: Abnormal   Collection Time   05/29/11  7:38 PM      Component Value Range Status Comment   C difficile by pcr POSITIVE (*) NEGATIVE  Final     Studies/Results: Dg Chest 2 View  05/10/2011  *RADIOLOGY REPORT*  Clinical Data: Pneumonia, congestive heart failure  CHEST - 2 VIEW  Comparison: Chest radiograph 05/09/2011  Findings: Sternotomy wires overlie stable enlarged heart silhouette.  There is mild air space disease at the lung bases. There are small bilateral pleural effusions.  No pneumothorax.  No focal consolidation.  No acute osseous abnormality.  IMPRESSION:  1.  Cardiomegaly  and mild air space disease suggests mild congestive heart failure. 2.  Small bilateral pleural effusions.  Original Report Authenticated By: Genevive Bi, M.D.   Dg Chest 2 View  05/09/2011  *RADIOLOGY REPORT*  Clinical Data: Cough, shortness of breath and fever.  CHEST - 2 VIEW  Comparison: Chest radiograph performed 10/31/2008  Findings: The lungs are well-aerated.  Streaky bilateral airspace opacities, most prominent at the mid lung zones bilaterally and left lung base, raise suspicion for pneumonia.  Mild underlying vascular congestion is again noted.  There is no evidence of significant pleural effusion or pneumothorax.  The heart is mildly  enlarged; the patient is status post median sternotomy, with evidence of prior CABG.  No acute osseous abnormalities are seen.  IMPRESSION:  1.  Streaky bilateral airspace opacities, most prominent at the midlung zones bilaterally and at the left lung base, raise suspicion for pneumonia. 2.  Mild vascular congestion and mild cardiomegaly again noted.  Original Report Authenticated By: Tonia Ghent, M.D.   Ct Head Wo Contrast  05/14/2011  *RADIOLOGY REPORT*  Clinical Data: 77 year male with confusion.  CT HEAD WITHOUT CONTRAST  Technique:  Contiguous axial images were obtained from the base of the skull through the vertex without contrast.  Comparison: None.  Findings: There is cerebral atrophy, particularly involving the frontal lobes.  No evidence for acute hemorrhage, mass lesion, midline shift, hydrocephalus or large infarct.  Diffuse calcifications involving the falx.  There is fluid in the right maxillary sinus and mucosal thickening and possibly fluid in left maxillary sinus.  Diffuse opacification of the ethmoid air cells. No acute bony abnormality.  IMPRESSION: No acute intracranial abnormality.  Paranasal sinus disease.  Fluid in the right maximal sinus could represent acute sinusitis.  Original Report Authenticated By: Richarda Overlie, M.D.   Dg Chest Portable 1 View  05/29/2011  *RADIOLOGY REPORT*  Clinical Data:  Weakness.  Unable to eat or drink.  PORTABLE CHEST - 1 VIEW  Comparison: 05/02/2011.  Findings: Cardiomegaly.  Mild vascular congestion but improved overall CHF.  Slight elevation left hemidiaphragm appears chronic. Retrocardiac density redemonstrated, possible infiltrate or scarring versus atelectasis.  IMPRESSION: Cardiomegaly.   Retrocardiac density could represent infiltrate versus scarring or atelectasis. This appears similar to priors. Overall the CHF is improved.  Original Report Authenticated By: Elsie Stain, M.D.    Medications: Scheduled Meds:   . sodium chloride    Intravenous Once  . aspirin EC  81 mg Oral Daily  . bisoprolol  10 mg Oral Daily  . ezetimibe  10 mg Oral Daily  . feeding supplement  237 mL Oral TID WC  . finasteride  5 mg Oral Daily  . gemfibrozil  600 mg Oral BID AC  . guaiFENesin  600 mg Oral BID  . hydrocortisone cream  1 application Topical BID  . insulin aspart  0-9 Units Subcutaneous Q4H  . levothyroxine  25 mcg Oral QAC breakfast  . metronidazole  500 mg Intravenous Once  . mirtazapine  7.5 mg Oral QHS  . phytonadione  5 mg Oral Once  . potassium chloride  40 mEq Oral Daily  . potassium chloride  40 mEq Oral Once  . saccharomyces boulardii  250 mg Oral BID  . Tamsulosin HCl  0.4 mg Oral QHS  . vancomycin  125 mg Oral Q6H  . white petrolatum-corn starch-lanolin   Topical Daily  . DISCONTD: sodium chloride   Intravenous STAT  . DISCONTD: ciprofloxacin  400 mg Intravenous Once  .  DISCONTD: ciprofloxacin  400 mg Intravenous Q12H  . DISCONTD: enoxaparin  40 mg Subcutaneous Q24H  . DISCONTD: furosemide  20 mg Oral Daily  . DISCONTD: loperamide  2 mg Oral BID  . DISCONTD: Menthol-Zinc Oxide  1 Tube Topical Daily  . DISCONTD: metronidazole  500 mg Intravenous Q8H  . DISCONTD: metroNIDAZOLE  500 mg Oral Q8H  . DISCONTD: Tamsulosin HCl  0.4 mg Oral QHS   Continuous Infusions:   . 0.9 % NaCl with KCl 40 mEq / L    . DISCONTD: sodium chloride    . DISCONTD: sodium chloride 75 mL/hr at 05/30/11 1526   PRN Meds:.acetaminophen, ondansetron (ZOFRAN) IV, ondansetron, zolpidem, DISCONTD: acetaminophen  Assessment/Plan: 1. C. difficile colitis: We'll change from intravenous Flagyl to oral vancomycin given patient's advanced age and propensity for rapid decline. Continue Florastor. Avoid unnecessary antibiotics and proton pump inhibitors. If patient starts having profuse diarrhea then may need a rectal tube. 2. Hypernatremic dehydration: Continue half normal saline with potassium supplements and monitor. 3. Hypokalemia secondary to  diarrhea: Replete and follow daily BMPs 4. ? Dysphagia: We'll get a swallow evaluation by speech therapist. This may just be lack of appetite and mucosal dryness from dehydration. 5. Coagulopathy secondary to poor nutrition and Coumadin: Patient did receive a dose of vitamin K and INR is better. No overt bleeding. 6. Anemia: Stable 7. Thrombocytopenia: Monitor daily CBCs 8. Paroxysmal atrial fibrillation: Controlled ventricular rate 9. Type 2 diabetes mellitus: Reasonably controlled 10. Chronic, possibly diastolic congestive heart failure: Currently patient clinically dehydrated. 11. DO NOT RESUSCITATE 12. Recent echo showed normal left ventricular ejection fraction and moderate to severe aortic stenosis. Avoid hypotension.  Discuss patient's care at length with his grandson who is at the bedside. Also discussed care with his daughter on the phone earlier today.     HONGALGI,ANAND 05/30/2011, 5:16 PM

## 2011-05-30 NOTE — Progress Notes (Signed)
Pt arrived with PO contrast from ED. Pt and son stated pt is unable to tolerate oral contrast r/t nausea and abdominal discomfort. Provider on-call for [facility] was notified. Orders received to hold CT of pelvis with contrast for time being. Provider also made aware of positive Cdiff by PCR. Will continue to monitor.

## 2011-05-30 NOTE — Progress Notes (Signed)
ANTICOAGULATION CONSULT NOTE - Follow-Up Consult  Pharmacy Consult for Coumadin Indication: atrial fibrillation (PAF)  Allergies  Allergen Reactions  . Morphine And Related Other (See Comments)    Patient Measurements: WT= 90.6KG (05/09/11) Ht= 73inches (05/09/11)     Vital Signs: Temp: 98.6 F (37 C) (12/18 0531) Temp src: Oral (12/18 0531) BP: 118/64 mmHg (12/18 0531) Pulse Rate: 106  (12/18 0531)  Labs:  Basename 05/30/11 0550 05/29/11 1350  HGB 11.1* 12.4*  HCT 34.5* 37.4*  PLT 139* 151  APTT 75* --  LABPROT 38.7* 58.6*  INR 3.89* 6.60*  HEPARINUNFRC -- --  CREATININE 0.77 0.87  CKTOTAL -- --  CKMB -- --  TROPONINI -- --   The CrCl is unknown because both a height and weight (above a minimum accepted value) are required for this calculation.   Assessment: 77 yo male h/o PAF on coumadin 5mg  daily except 7.53m qMon & Wed. Coumadin reversed with 5mg  po vit K 12/17 due to supratherapeutic INR =6.6. INR now down to 3.89. No bleeding reported.    Goal of Therapy:  INR 2-3   Plan:  No coumadin today.  Check INR in AM and daily.   Lavonia Dana 05/30/2011,10:16 AM

## 2011-05-31 LAB — BASIC METABOLIC PANEL
BUN: 14 mg/dL (ref 6–23)
Creatinine, Ser: 0.77 mg/dL (ref 0.50–1.35)
GFR calc Af Amer: 85 mL/min — ABNORMAL LOW (ref >90)
GFR calc non Af Amer: 73 mL/min — ABNORMAL LOW (ref >90)
Potassium: 4.2 mEq/L (ref 3.5–5.1)

## 2011-05-31 LAB — CBC
MCH: 31.4 pg (ref 26.0–34.0)
MCHC: 31.8 g/dL (ref 30.0–36.0)
Platelets: 137 10*3/uL — ABNORMAL LOW (ref 150–400)
RDW: 14.4 % (ref 11.5–15.5)

## 2011-05-31 LAB — GLUCOSE, CAPILLARY: Glucose-Capillary: 153 mg/dL — ABNORMAL HIGH (ref 70–99)

## 2011-05-31 LAB — PROTIME-INR: Prothrombin Time: 33.1 seconds — ABNORMAL HIGH (ref 11.6–15.2)

## 2011-05-31 MED ORDER — WARFARIN SODIUM 2 MG PO TABS
2.0000 mg | ORAL_TABLET | Freq: Once | ORAL | Status: AC
  Administered 2011-05-31: 2 mg via ORAL
  Filled 2011-05-31: qty 1

## 2011-05-31 MED ORDER — DEXTROSE 5 % IV SOLN: INTRAVENOUS | Status: DC

## 2011-05-31 MED ORDER — DEXTROSE-NACL 5-0.45 % IV SOLN
INTRAVENOUS | Status: DC
  Administered 2011-05-31: 17:00:00 via INTRAVENOUS

## 2011-05-31 NOTE — Progress Notes (Signed)
Speech Language/Pathology Clinical/Bedside Swallow Evaluation Patient Details  Name: David Davidson MRN: [id number] DOB: June 15, 1910 Today's Date: 05/31/2011  Past Medical History:  Past Medical History  Diagnosis Date  . Myocardial infarct   . Diabetes mellitus   . Hypertension   . CHF (congestive heart failure)   . Coronary artery disease   . Dysrhythmia    Past Surgical History:  Past Surgical History  Procedure Date  . Coronary artery bypass graft   . Cholecystectomy     Assessment/Recommendations/Treatment Plan   SLP Assessment Clinical Impression Statement: Pt. exhibited mild signs of pharyngeal deficits and increased velocity and decreased airway protection leading to slight cough after straw sips thin which was prevented with small cup sips.  Mastication adequate with cracker but pt. stated he prefers to remain on liquid diet at this time due to GI issues.   Risk for Aspiration: Mild  Recommendations Solid Consistency: Regular Liquid Consistency: Thin Liquid Administration via: Cup;No straw Supervision: Intermittent supervision to cue for compensatory strategies Compensations: Small sips/bites (PT. PREFERS TO STAY ON FULL LIQUIDS) Postural Changes and/or Swallow Maneuvers: Seated upright 90 degrees Follow up Recommendations: None  Treatment Plan Treatment Plan Recommendations: No treatment recommended at this time     Individuals Consulted Consulted and Agree with Results and Recommendations: Patient  Swallow Study Prior Functional Status     General  HPI (Other Pertinent Information): 77 yr old admitted with 2 weeks of diarrhea, poor appetite.  Hospitalized 05/09/11 for pneumonia.  PMH:  MI, HTN, CAD, CHF.  Pt. complains of not wanting to eat and no appetite.  He denies difficulty with swallowing po's with this SLP.  No other pna's since 75/12. Type of Study: Bedside swallow evaluation Diet Prior to this Study: Thin liquids;Other (Comment) (FULL LIQUID  DIET) Behavior/Cognition: Alert;Cooperative;Pleasant mood Oral Cavity - Dentition: Adequate natural dentition Vision: Functional for self-feeding Patient Positioning: Upright in bed Baseline Vocal Quality: Normal Volitional Cough: Strong Volitional Swallow: Able to elicit Ice chips: Not tested  Oral Motor/Sensory Function  Labial ROM: Within Functional Limits Labial Symmetry: Within Functional Limits Lingual Symmetry: Within Functional Limits Facial ROM: Within Functional Limits Facial Symmetry: Within Functional Limits Facial Strength: Within Functional Limits Velum: Within Functional Limits Mandible: Within Functional Limits  Consistency Results  Ice Chips Ice chips: Not tested  Thin Liquid Thin Liquid: Impaired Presentation: Straw Pharyngeal  Phase Impairments: Cough - Immediate Other Comments: SLIGHT COUGH AFTER STRAW SIP   Nectar Thick Liquid Nectar Thick Liquid: Not tested  Honey Thick Liquid Honey Thick Liquid: Not tested  Puree Puree: Within functional limits  Solid Solid: Within functional limits   Royce Macadamia M.Ed [employer] [phone]  05/31/2011

## 2011-05-31 NOTE — Progress Notes (Signed)
ANTICOAGULATION CONSULT NOTE - Follow Up Consult  Pharmacy Consult for Coumadin Indication: afib  Allergies  Allergen Reactions  . Morphine And Related Other (See Comments)    Patient Measurements: Height: 6\' 1"  (185.4 cm) Weight: 198 lb 10.2 oz (90.1 kg) IBW/kg (Calculated) : 79.9  Adjusted Body Weight:   Vital Signs: Temp: 97.6 F (36.4 C) (12/19 0500) Temp src: Oral (12/19 0500) BP: 103/62 mmHg (12/19 0500) Pulse Rate: 101  (12/19 0500)  Labs:  Basename 05/31/11 0520 05/30/11 0550 05/29/11 1350  HGB 11.4* 11.1* --  HCT 35.8* 34.5* 37.4*  PLT 137* 139* 151  APTT -- 75* --  LABPROT 33.1* 38.7* 58.6*  INR 3.18* 3.89* 6.60*  HEPARINUNFRC -- -- --  CREATININE 0.77 0.77 0.87  CKTOTAL -- -- --  CKMB -- -- --  TROPONINI -- -- --   Estimated Creatinine Clearance: 58.3 ml/min (by C-G formula based on Cr of 0.77).   Medications:  Scheduled:    . aspirin EC  81 mg Oral Daily  . bisoprolol  10 mg Oral Daily  . ezetimibe  10 mg Oral Daily  . feeding supplement  237 mL Oral TID WC  . finasteride  5 mg Oral Daily  . gemfibrozil  600 mg Oral BID AC  . guaiFENesin  600 mg Oral BID  . hydrocortisone cream  1 application Topical BID  . insulin aspart  0-9 Units Subcutaneous Q4H  . levothyroxine  25 mcg Oral QAC breakfast  . mirtazapine  7.5 mg Oral QHS  . potassium chloride  40 mEq Oral Daily  . potassium chloride  40 mEq Oral Once  . saccharomyces boulardii  250 mg Oral BID  . Tamsulosin HCl  0.4 mg Oral QHS  . vancomycin  125 mg Oral Q6H  . white petrolatum-corn starch-lanolin   Topical Daily  . DISCONTD: loperamide  2 mg Oral BID  . DISCONTD: metroNIDAZOLE  500 mg Oral Q8H    Assessment: 89 YOM with PAF on coumadin, INR supratherapeutic at presentation s/o Vit K administration. He is not eating, has on abx, and has C. difficile which all likely contributed to increase INR. Continues not to eat well. INT = 3.18 and predict IRN to fall < 3.  No bleeding  noted   Goal of Therapy:  INR 2-3   Plan:  1. Give low dose of coumadin (2mg ) tonight as predict if dose held, INR will fall <2.  2. INR in am  Dannielle Huh 05/31/2011,11:26 AM

## 2011-05-31 NOTE — Progress Notes (Addendum)
Clinical Social Worker completed psychosocial assessment and placed in shadow chart. Pt is currently a resident at [facility] and pt and pt family would like to explore [facility] and [facility]. Pt and pt family agreeable to pt returning to [facility] if the other options for facilities are unable to offer. Clinical Social Worker completed FL-2 and sent pt clinical information to [facility] and [facility]. Clinical Social Worker to follow up with pt and pt son in regard to bed offers. Pt insurance requires authorization before pt transfers to SNF and awaiting PT/OT evaluation to submit insurance for authorization. Clinical Social Worker to facilitate pt D/C needs when pt medically ready for D/C.  Jacklynn Lewis, MSW, LCSWA  Clinical Social Work [phone]

## 2011-05-31 NOTE — Plan of Care (Signed)
Problem: Discharge Progression Outcomes Goal: Tolerating diet Bedside swallow assessment completed.  No dysphagia observed.  He is safe for regular texture diet and thin liquids, however, he prefers to remain on full liquids due to GI issues.  No f/u needed.  See full report under bedside swallow section.  Breck Coons [place].Ed [employer] [phone]  05/31/2011

## 2011-05-31 NOTE — Progress Notes (Signed)
Subjective: Mild abdominal discomfort. Tolerating his diet well. Has had 2 bowel movements today, none overnight.  Objective: Filed Vitals:   05/30/11 1553 05/30/11 2000 05/30/11 2100 05/31/11 0500  BP: 133/64  106/70 103/62  Pulse: 89  95 101  Temp: 98.7 F (37.1 C)  97.8 F (36.6 C) 97.6 F (36.4 C)  TempSrc: Oral  Oral Oral  Resp: 20  18 18   Height:  6\' 1"  (1.854 m)    Weight:  90.1 kg (198 lb 10.2 oz)    SpO2: 96%  95% 94%   Weight change:   Intake/Output Summary (Last 24 hours) at 05/31/11 1300 Last data filed at 05/31/11 0800  Gross per 24 hour  Intake 1601.25 ml  Output    200 ml  Net 1401.25 ml    General: Alert, awake, oriented x3, in no acute distress.  HEENT: No bruits, no goiter.  Heart: Regular rate and rhythm, without murmurs, rubs, gallops.  Lungs: Good air movement and clear to auscultation Abdomen: Soft and tender in the left lower quadrant with positive bowel sounds no rebound or guarding. Neuro: Grossly intact, nonfocal.   Lab Results:  Basename 05/31/11 0520 05/30/11 0550 05/29/11 2047  NA 147* 145 --  K 4.2 2.8* --  CL 117* 113* --  CO2 24 24 --  GLUCOSE 140* 160* --  BUN 14 11 --  CREATININE 0.77 0.77 --  CALCIUM 8.6 8.1* --  MG 2.3 -- 2.2  PHOS -- -- 2.6    Basename 05/29/11 1350  AST 22  ALT 15  ALKPHOS 62  BILITOT 0.4  PROT 7.2  ALBUMIN 2.7*    Basename 05/31/11 0520 05/30/11 0550 05/29/11 1350  WBC 10.4 11.3* --  NEUTROABS -- -- 10.9*  HGB 11.4* 11.1* --  HCT 35.8* 34.5* --  MCV 98.6 97.2 --  PLT 137* 139* --    Basename 05/29/11 2047  HGBA1C 7.1*    Basename 05/29/11 2047  CHOL 122  HDL 18*  LDLCALC 77  TRIG 960  CHOLHDL 6.8  LDLDIRECT --    Basename 05/29/11 2047  TSH 0.828  T4TOTAL --  T3FREE --  THYROIDAB --    Micro Results: Recent Results (from the past 240 hour(s))  STOOL CULTURE     Status: Normal (Preliminary result)   Collection Time   05/29/11  7:37 PM      Component Value Range Status  Comment   Specimen Description STOOL   Final    Special Requests NONE   Final    Culture     Final    Value: NO SUSPICIOUS COLONIES, CONTINUING TO HOLD     Note: REDUCED NORMAL FLORA PRESENT   Report Status PENDING   Incomplete   CLOSTRIDIUM DIFFICILE BY PCR     Status: Abnormal   Collection Time   05/29/11  7:38 PM      Component Value Range Status Comment   C difficile by pcr POSITIVE (*) NEGATIVE  Final   URINE CULTURE     Status: Normal (Preliminary result)   Collection Time   05/30/11  2:15 AM      Component Value Range Status Comment   Specimen Description URINE, CLEAN CATCH   Final    Special Requests NONE   Final    Setup Time [id number]   Final    Colony Count PENDING   Incomplete    Culture Culture reincubated for better growth   Final    Report Status PENDING   Incomplete  Studies/Results: Dg Chest Portable 1 View  05/29/2011  *RADIOLOGY REPORT*  Clinical Data:  Weakness.  Unable to eat or drink.  PORTABLE CHEST - 1 VIEW  Comparison: 05/02/2011.  Findings: Cardiomegaly.  Mild vascular congestion but improved overall CHF.  Slight elevation left hemidiaphragm appears chronic. Retrocardiac density redemonstrated, possible infiltrate or scarring versus atelectasis.  IMPRESSION: Cardiomegaly.   Retrocardiac density could represent infiltrate versus scarring or atelectasis. This appears similar to priors. Overall the CHF is improved.  Original Report Authenticated By: Elsie Stain, M.D.    Medications: I have reviewed the patient's current medications.   Principal Problem:  *Clostridium difficile colitis Active Problems:  Paroxysmal a-fib  Diabetes mellitus  Hypertension  Hyperlipidemia  Community acquired bacterial pneumonia  Hypokalemia  Leukocytosis  UTI (lower urinary tract infection)  Hypothyroidism    Assessment and plan: -Currently on vancomycin that was started on 05/30/2011, has ongoing diarrhea. He's also on Florastor.His white blood cell  continues to improve, but he has ongoing tachycardia. - will change IV fluids to D5, as his hypernatremia was not improving with half normal saline. His potassium is within normal limits.  -Urine cultures remain negative to date his white blood cell count continues to improve. Will continue with antibiotics if necessary. -Blood glucose currently well controlled continue current treatment. -Hypertension has remained stable continue current management.   LOS: 2 days   Marinda Elk M.D. Pager: [phone] [facility] 05/31/2011, 1:00 PM

## 2011-06-01 LAB — URINE CULTURE: Colony Count: >=100000

## 2011-06-01 LAB — GLUCOSE, CAPILLARY
Glucose-Capillary: 146 mg/dL — ABNORMAL HIGH (ref 70–99)
Glucose-Capillary: 148 mg/dL — ABNORMAL HIGH (ref 70–99)

## 2011-06-01 LAB — PROTIME-INR: INR: 3.41 — ABNORMAL HIGH (ref 0.00–1.49)

## 2011-06-01 LAB — MRSA PCR SCREENING: MRSA by PCR: POSITIVE — AB

## 2011-06-01 MED ORDER — MUPIROCIN 2 % EX OINT
1.0000 "application " | TOPICAL_OINTMENT | Freq: Two times a day (BID) | CUTANEOUS | Status: DC
  Administered 2011-06-01 (×2): 1 via NASAL
  Filled 2011-06-01: qty 22

## 2011-06-01 MED ORDER — VANCOMYCIN HCL 125 MG PO CAPS: 125.0000 mg | ORAL_CAPSULE | Freq: Four times a day (QID) | ORAL | Status: AC

## 2011-06-01 MED ORDER — CHLORHEXIDINE GLUCONATE CLOTH 2 % EX PADS
6.0000 | MEDICATED_PAD | Freq: Every day | CUTANEOUS | Status: DC
  Administered 2011-06-01 – 2011-06-02 (×2): 6 via TOPICAL

## 2011-06-01 MED ORDER — DEXTROSE 5 % IV SOLN
INTRAVENOUS | Status: DC
  Administered 2011-06-01: 1000 mL via INTRAVENOUS

## 2011-06-01 NOTE — Progress Notes (Signed)
ANTICOAGULATION CONSULT NOTE - Follow Up Consult  Pharmacy Consult for Coumadin Indication: atrial fibrillation  Assessment: 77 yo male on coumadin PTA for afib who presented with supratherapeutic INRs, s/p reversal with PO vit K.  INR today remains elevated at 3.41, likely due to poor PO intake in the setting of Cdiff infection.  No bleeding issues reported.  Pharmacy System-Based Medication Review: Infectious Diseases: Cdiff being treated with PO vanco 125mg  QID & Florastor, afebrile, WBC down to 10.4-->11.3, UCx=enterococcus sp. >100k, follow-up antibiotic plans Cardiovascular: PAF- tachycardia/hypotension resolved, on home bisoprolol  Gastrointestinal: Patient continues to have mild abd discomfort, x2 stools yesterday, tolerating diet well Endocrinology: DMII- CBGs controlled at 118-163 on SSI Fluids/Electrolytes/Nutrition: Glucerna supplements, hypernatremia Nephrology: CrCl~58, stable, I/O: 1141/200 (net +941) Best Practices: coumadin, home meds addressed  Goal of Therapy:  INR 2-3   Plan:  Hold coumadin x1 today. Check INR in AM.  Ival Bible [phone] 06/01/2011,10:35 AM  Allergies  Allergen Reactions  . Morphine And Related Other (See Comments)    Patient Measurements: Height: 6\' 1"  (185.4 cm) Weight: 198 lb 10.2 oz (90.1 kg) IBW/kg (Calculated) : 79.9   Vital Signs: Temp: 97.4 F (36.3 C) (12/20 0500) Temp src: Oral (12/20 0500) BP: 150/80 mmHg (12/20 0500) Pulse Rate: 81  (12/20 0500)  Labs:  Basename 06/01/11 0651 05/31/11 0520 05/30/11 0550 05/29/11 1350  HGB -- 11.4* 11.1* --  HCT -- 35.8* 34.5* 37.4*  PLT -- 137* 139* 151  APTT -- -- 75* --  LABPROT 34.9* 33.1* 38.7* --  INR 3.41* 3.18* 3.89* --  HEPARINUNFRC -- -- -- --  CREATININE -- 0.77 0.77 0.87  CKTOTAL -- -- -- --  CKMB -- -- -- --  TROPONINI -- -- -- --   Estimated Creatinine Clearance: 58.3 ml/min (by C-G formula based on Cr of 0.77).   Medications:  Scheduled:    .  aspirin EC  81 mg Oral Daily  . bisoprolol  10 mg Oral Daily  . Chlorhexidine Gluconate Cloth  6 each Topical Q0600  . ezetimibe  10 mg Oral Daily  . feeding supplement  237 mL Oral TID WC  . finasteride  5 mg Oral Daily  . gemfibrozil  600 mg Oral BID AC  . guaiFENesin  600 mg Oral BID  . hydrocortisone cream  1 application Topical BID  . insulin aspart  0-9 Units Subcutaneous Q4H  . levothyroxine  25 mcg Oral QAC breakfast  . mirtazapine  7.5 mg Oral QHS  . mupirocin ointment  1 application Nasal BID  . potassium chloride  40 mEq Oral Daily  . saccharomyces boulardii  250 mg Oral BID  . Tamsulosin HCl  0.4 mg Oral QHS  . vancomycin  125 mg Oral Q6H  . warfarin  2 mg Oral ONCE-1800  . white petrolatum-corn starch-lanolin   Topical Daily   Infusions:    . dextrose 5 % and 0.45% NaCl 75 mL/hr at 05/31/11 1657  . DISCONTD: dextrose    . DISCONTD: sodium chloride 0.45 % with kcl 75 mL/hr at 05/31/11 [phone]

## 2011-06-01 NOTE — Progress Notes (Signed)
Physical Therapy Evaluation Patient Details Name: David Davidson MRN: [id number] DOB: 03/26/1911 Today's Date: 06/01/2011  Problem List:  Patient Active Problem List  Diagnoses  . CAD (coronary artery disease)  . Paroxysmal a-fib  . Diabetes mellitus  . Hypertension  . S/P CABG (coronary artery bypass graft)  . Hyperlipidemia  . Community acquired bacterial pneumonia  . Clostridium difficile colitis  . Hypokalemia  . Leukocytosis  . UTI (lower urinary tract infection)  . Hypothyroidism    Past Medical History:  Past Medical History  Diagnosis Date  . Myocardial infarct   . Diabetes mellitus   . Hypertension   . CHF (congestive heart failure)   . Coronary artery disease   . Dysrhythmia    Past Surgical History:  Past Surgical History  Procedure Date  . Coronary artery bypass graft   . Cholecystectomy     PT Assessment/Plan/Recommendation PT Assessment Clinical Impression Statement: Patient presents from rehab facility with C-Diff post treatment for recent PNA. He was previously living at home alone and has desire to return there post short rehab stay. Patient will benefit from skilled PT in the acute care setting to improve functional mobilty, strength, balance, and activity tolerance. PT Recommendation/Assessment: Patient will need skilled PT in the acute care venue PT Problem List: Decreased strength;Decreased activity tolerance;Decreased balance;Decreased mobility;Decreased knowledge of use of DME;Decreased knowledge of precautions Barriers to Discharge: Decreased caregiver support PT Therapy Diagnosis : Difficulty walking;Generalized weakness PT Plan PT Frequency: Min 3X/week PT Treatment/Interventions: DME instruction;Gait training;Functional mobility training;Therapeutic activities;Therapeutic exercise;Balance training;Patient/family education PT Recommendation Follow Up Recommendations: Skilled nursing facility Equipment Recommended: Defer to next venue PT  Goals  Acute Rehab PT Goals PT Goal Formulation: With patient Time For Goal Achievement: 2 weeks Pt will go Supine/Side to Sit: with modified independence Pt will go Sit to Supine/Side: with modified independence Pt will go Sit to Stand: with supervision PT Goal: Sit to Stand - Progress: Not met Pt will go Stand to Sit: with supervision PT Goal: Stand to Sit - Progress: Not met Pt will Ambulate: >150 feet;with least restrictive assistive device;with supervision PT Goal: Ambulate - Progress: Other (comment)  PT Evaluation Precautions/Restrictions  Precautions Precautions: Fall Prior Functioning  Home Living Lives With: Alone Receives Help From: Family (son next door) Type of Home: House (However, patient has been at rehab since beginning of month) Home Adaptive Equipment: Straight cane;Walker - rolling   Cognition Cognition Arousal/Alertness: Awake/alert Overall Cognitive Status: Appears within functional limits for tasks assessed Orientation Level: Oriented X4 Cognition - Other Comments: 25% of crossword complete on the table with correct answers Sensation/Coordination Sensation Light Touch: Appears Intact Proprioception: Appears Intact Extremity Assessment RLE Assessment RLE Assessment: Exceptions to [facility] RLE AROM (degrees) Overall AROM Right Lower Extremity: Within functional limits for tasks assessed RLE Strength RLE Overall Strength: Deficits RLE Overall Strength Comments: Grossly 4/5 LLE Assessment LLE Assessment: Exceptions to WFL LLE AROM (degrees) Overall AROM Left Lower Extremity: Within functional limits for tasks assessed LLE Strength LLE Overall Strength: Deficits LLE Overall Strength Comments: Grossly 4/5 Mobility (including Balance) Bed Mobility Supine to Sit: 4: Min assist Supine to Sit Details (indicate cue type and reason): Difficulty initiating trunk Sitting - Scoot to Edge of Bed: 6: Modified independent (Device/Increase time) Transfers Sit to  Stand: 4: Min assist;From bed;3: Mod assist;From toilet;With upper extremity assist Sit to Stand Details (indicate cue type and reason): Increased difficulty standing from toilet. Patient able to complete up to 90% of stand from bed. Stand to  Sit: 4: Min assist;To toilet;To bed Stand to Sit Details: Verbal cues with return to bed as maintains contact on rolling walker - educated on correct technique for the future. Needs assistance to control descent to toilet in addition to grab bar use. Ambulation/Gait Ambulation/Gait Assistance: 4: Min assist Ambulation/Gait Assistance Details (indicate cue type and reason): Verbal cues secondary to flexed posture and advances rolling walker too far ahead of self. Ambulation Distance (Feet):  (2 x 20 feet) Assistive device: Rolling walker Gait Pattern: Trunk flexed;Step-through pattern    End of Session PT - End of Session Equipment Utilized During Treatment: Gait belt Activity Tolerance: Patient tolerated treatment well Patient left: in bed;with call bell in reach General Behavior During Session: [facility] for tasks performed Cognition: [facility] for tasks performed  Edwyna Perfect, PT  Pager [phone]  06/01/2011, 3:03 PM

## 2011-06-01 NOTE — Progress Notes (Signed)
Clinical Social Worker spoke with pt son in regard to pt discharge plans. Discussed with pt son that [facility] or [facility] were able to offer a bed. Pt and pt son agreeable to pt returning to [facility]. Clinical Social Worker contacted facility and left message in regard to pt return. Clinical Social Worker submitted pt clinical information for insurance authorization. Clinical Social Worker to facilitate pt D/C needs.  Jacklynn Lewis, MSW, LCSWA  Clinical Social Work [phone]

## 2011-06-01 NOTE — Discharge Summary (Signed)
Admit date: 05/29/2011 Discharge date: 06/01/2011  Primary Care Physician:  [name], MD, MD   Discharge Diagnoses:   Active Hospital Problems  Diagnoses Date Noted   . Clostridium difficile colitis 05/29/2011   . Hypothyroidism 05/29/2011   . Diabetes mellitus 05/09/2011   . Hyperlipidemia 05/09/2011   . Hypertension 05/09/2011   . Paroxysmal a-fib 05/09/2011     Resolved Hospital Problems  Diagnoses Date Noted Date Resolved  . Hypokalemia 05/29/2011 06/01/2011  . Leukocytosis 05/29/2011 06/01/2011     DISCHARGE MEDICATION: Current Discharge Medication List    START taking these medications   Details  vancomycin (VANCOCIN HCL) 125 MG capsule Take 1 capsule (125 mg total) by mouth 4 (four) times daily. Qty: 40 capsule, Refills: 0      CONTINUE these medications which have NOT CHANGED   Details  acetaminophen (TYLENOL) 500 MG tablet Take 1,000 mg by mouth at bedtime as needed. For pain     aspirin 81 MG tablet Take 81 mg by mouth daily.      bisoprolol (ZEBETA) 5 MG tablet Take 10 mg by mouth daily.      cholecalciferol (VITAMIN D) 1000 UNITS tablet Take 2,000 Units by mouth daily.      ezetimibe (ZETIA) 10 MG tablet Take 10 mg by mouth daily.      finasteride (PROSCAR) 5 MG tablet Take 5 mg by mouth daily.      fish oil-omega-3 fatty acids 1000 MG capsule Take 1 g by mouth daily.      furosemide (LASIX) 20 MG tablet Take 20 mg by mouth daily.      gemfibrozil (LOPID) 600 MG tablet Take 600 mg by mouth 2 (two) times daily before a meal.      glyBURIDE (DIABETA) 5 MG tablet Take 1 tablet (5 mg total) by mouth 2 (two) times daily with a meal. Qty: 60 tablet, Refills: 0    hydrocortisone cream 1 % Apply 1 application topically 2 (two) times daily. itchy back rash     levothyroxine (SYNTHROID, LEVOTHROID) 25 MCG tablet Take 25 mcg by mouth daily.      Menthol-Zinc Oxide (CALMOSEPTINE EX) Apply 1 application topically daily. Apply to buttocks every shift     mirtazapine (REMERON) 7.5 MG tablet Take 7.5 mg by mouth at bedtime.      saccharomyces boulardii (FLORASTOR) 250 MG capsule Take 250 mg by mouth 2 (two) times daily. Take for 14 days     Tamsulosin HCl (FLOMAX) 0.4 MG CAPS Take 0.4 mg by mouth daily.      !! warfarin (COUMADIN) 5 MG tablet Take 5 mg by mouth. Takes 5mg  on Tues, Thurs, Fri, Sat, & Sun    !! warfarin (COUMADIN) 7.5 MG tablet Take 7.5 mg by mouth daily. Takes 7.5mg  on Mon, & Weds    zolpidem (AMBIEN) 5 MG tablet Take 5 mg by mouth at bedtime as needed. For insomnia      !! - Potential duplicate medications found. Please discuss with provider.    STOP taking these medications     guaiFENesin (MUCINEX) 600 MG 12 hr tablet      nitrofurantoin (MACRODANTIN) 100 MG capsule            Consults:     SIGNIFICANT DIAGNOSTIC STUDIES:  Dg Chest 2 View  05/10/2011  *RADIOLOGY REPORT*  Clinical Data: Pneumonia, congestive heart failure  CHEST - 2 VIEW  Comparison: Chest radiograph 05/09/2011  Findings: Sternotomy wires overlie stable enlarged heart silhouette.  There is  mild air space disease at the lung bases. There are small bilateral pleural effusions.  No pneumothorax.  No focal consolidation.  No acute osseous abnormality.  IMPRESSION:  1.  Cardiomegaly and mild air space disease suggests mild congestive heart failure. 2.  Small bilateral pleural effusions.  Original Report Authenticated By: Genevive Bi, M.D.   Dg Chest 2 View  05/09/2011  *RADIOLOGY REPORT*  Clinical Data: Cough, shortness of breath and fever.  CHEST - 2 VIEW  Comparison: Chest radiograph performed 10/31/2008  Findings: The lungs are well-aerated.  Streaky bilateral airspace opacities, most prominent at the mid lung zones bilaterally and left lung base, raise suspicion for pneumonia.  Mild underlying vascular congestion is again noted.  There is no evidence of significant pleural effusion or pneumothorax.  The heart is mildly enlarged; the patient is  status post median sternotomy, with evidence of prior CABG.  No acute osseous abnormalities are seen.  IMPRESSION:  1.  Streaky bilateral airspace opacities, most prominent at the midlung zones bilaterally and at the left lung base, raise suspicion for pneumonia. 2.  Mild vascular congestion and mild cardiomegaly again noted.  Original Report Authenticated By: Tonia Ghent, M.D.   Ct Head Wo Contrast  05/14/2011  *RADIOLOGY REPORT*  Clinical Data: 77 year male with confusion.  CT HEAD WITHOUT CONTRAST  Technique:  Contiguous axial images were obtained from the base of the skull through the vertex without contrast.  Comparison: None.  Findings: There is cerebral atrophy, particularly involving the frontal lobes.  No evidence for acute hemorrhage, mass lesion, midline shift, hydrocephalus or large infarct.  Diffuse calcifications involving the falx.  There is fluid in the right maxillary sinus and mucosal thickening and possibly fluid in left maxillary sinus.  Diffuse opacification of the ethmoid air cells. No acute bony abnormality.  IMPRESSION: No acute intracranial abnormality.  Paranasal sinus disease.  Fluid in the right maximal sinus could represent acute sinusitis.  Original Report Authenticated By: Richarda Overlie, M.D.   Dg Chest Portable 1 View  05/29/2011  *RADIOLOGY REPORT*  Clinical Data:  Weakness.  Unable to eat or drink.  PORTABLE CHEST - 1 VIEW  Comparison: 05/02/2011.  Findings: Cardiomegaly.  Mild vascular congestion but improved overall CHF.  Slight elevation left hemidiaphragm appears chronic. Retrocardiac density redemonstrated, possible infiltrate or scarring versus atelectasis.  IMPRESSION: Cardiomegaly.   Retrocardiac density could represent infiltrate versus scarring or atelectasis. This appears similar to priors. Overall the CHF is improved.  Original Report Authenticated By: Elsie Stain, M.D.     Recent Results (from the past 240 hour(s))  STOOL CULTURE     Status: Normal  (Preliminary result)   Collection Time   05/29/11  7:37 PM      Component Value Range Status Comment   Specimen Description STOOL   Final    Special Requests NONE   Final    Culture     Final    Value: NO SUSPICIOUS COLONIES, CONTINUING TO HOLD     Note: REDUCED NORMAL FLORA PRESENT   Report Status PENDING   Incomplete   CLOSTRIDIUM DIFFICILE BY PCR     Status: Abnormal   Collection Time   05/29/11  7:38 PM      Component Value Range Status Comment   C difficile by pcr POSITIVE (*) NEGATIVE  Final   URINE CULTURE     Status: Normal   Collection Time   05/30/11  2:15 AM      Component Value Range Status  Comment   Specimen Description URINE, CLEAN CATCH   Final    Special Requests NONE   Final    Setup Time [id number]   Final    Colony Count >=100,000 COLONIES/ML   Final    Culture ENTEROCOCCUS SPECIES   Final    Report Status 06/01/2011 FINAL   Final    Organism ID, Bacteria ENTEROCOCCUS SPECIES   Final   MRSA PCR SCREENING     Status: Abnormal   Collection Time   06/01/11  2:36 AM      Component Value Range Status Comment   MRSA by PCR POSITIVE (*) NEGATIVE  Final     BRIEF ADMITTING H & P:  77 yo male with PMH outlined below who presents to [facility] ED with main concern of progressively worsening, non bloody, mostly watery diarrhea that initially started approximately 2 weeks prior to admission. He tells me that he was recently admitted to the hospital 05/09/2011 for PNA and was treated with antibiotics at that time. In addition, at SNF he was told he has UTI and was started on another antibiotic. His diarrhea is not getting any better and he denies any specific aggravating or alleviating factors. He also denies similar episodes in the past. He denies fevers, chills, any specific abdominal concerns except generalized abdominal discomfort mostly in lower quadrants, has mild dysuria and no other urinary concerns.Pt denies other systemic concerns except poor oral intake.   Active  Hospital Problems  Diagnoses Date Noted   . Clostridium difficile colitis: He was omitted to the floor was started on vancomycin empirically. This was continue over 3 days. His stool output decrease his stools were more formed. So he was discharged in a total of 10 additional days of vancomycin the outpatient. It is to note that this was probably secondary to his course of pneumonia treatment and UTI. Macrobid was stopped he did not complain of any further urinary tract symptoms.  05/29/2011   . Hypothyroidism: Stable no changes were made.  05/29/2011   . Diabetes mellitus: Stable no changes were made  05/09/2011   . Hypertension: On admission this was low blood pressure his blood pressure medications were held. These will be restarted as an outpatient.  05/09/2011   . Paroxysmal a-fib: He was hypotension during admission this resolved with IV fluids he has been no episodes of A. fib.  05/09/2011     Resolved Hospital Problems  Diagnoses Date Noted Date Resolved  . Hypokalemia: This probably secondary to dehydration and increased stool output this was repleted and resolved.  05/29/2011 06/01/2011  . Leukocytosis: See Clostridium difficile colitis . 05/29/2011 06/01/2011     Disposition and Follow-up:  Discharge Orders    Future Orders Please Complete By Expires   Diet - low sodium heart healthy      Increase activity slowly        Follow-up Information    Follow up with [name], MD in 2 weeks. (As needed)           DISCHARGE EXAM:  General: Alert, awake, oriented x3, in no acute distress.  HEENT: No bruits, no goiter.  Heart: Regular rate and rhythm, without murmurs, rubs, gallops.  Lungs: Good air movement and clear to auscultation  Abdomen: Soft, nontender, nondistended, positive bowel sounds.  Neuro: Grossly intact, nonfocal.   Blood pressure 102/57, pulse 78, temperature 97.8 F (36.6 C), temperature source Oral, resp. rate 18, height 6\' 1"  (1.854 m), weight  90.1 kg (198 lb 10.2 oz),  SpO2 98.00%.   Basename 05/31/11 0520 05/30/11 0550 05/29/11 2047  NA 147* 145 --  K 4.2 2.8* --  CL 117* 113* --  CO2 24 24 --  GLUCOSE 140* 160* --  BUN 14 11 --  CREATININE 0.77 0.77 --  CALCIUM 8.6 8.1* --  MG 2.3 -- 2.2  PHOS -- -- 2.6   No results found for this basename: AST:2,ALT:2,ALKPHOS:2,BILITOT:2,PROT:2,ALBUMIN:2 in the last 72 hours No results found for this basename: LIPASE:2,AMYLASE:2 in the last 72 hours  Basename 05/31/11 0520 05/30/11 0550  WBC 10.4 11.3*  NEUTROABS -- --  HGB 11.4* 11.1*  HCT 35.8* 34.5*  MCV 98.6 97.2  PLT 137* 139*    Signed: Marinda Elk M.D. 06/01/2011, 4:59 PM

## 2011-06-01 NOTE — Progress Notes (Signed)
Subjective:  Patient relates his abdominal pain is gone. He is able to tolerate his diet well. He had one bowel movement today it is more formed. Objective: Filed Vitals:   05/31/11 1400 05/31/11 2100 06/01/11 0500 06/01/11 1508  BP: 152/9 103/54 150/80 102/57  Pulse: 80 93 81 78  Temp: 98.1 F (36.7 C) 97.9 F (36.6 C) 97.4 F (36.3 C) 97.8 F (36.6 C)  TempSrc: Oral Oral Oral Oral  Resp: 16 18 18 18   Height:      Weight:      SpO2: 98% 96% 94% 98%   Weight change:   Intake/Output Summary (Last 24 hours) at 06/01/11 1602 Last data filed at 05/31/11 2100  Gross per 24 hour  Intake    240 ml  Output    200 ml  Net     40 ml    General: Alert, awake, oriented x3, in no acute distress.  HEENT: No bruits, no goiter.  Heart: Regular rate and rhythm, without murmurs, rubs, gallops.  Lungs: Good air movement and clear to auscultation Abdomen: Soft, nontender, nondistended, positive bowel sounds.  Neuro: Grossly intact, nonfocal.   Lab Results:  [facility] 05/31/11 0520 05/30/11 0550 05/29/11 2047  NA 147* 145 --  K 4.2 2.8* --  CL 117* 113* --  CO2 24 24 --  GLUCOSE 140* 160* --  BUN 14 11 --  CREATININE 0.77 0.77 --  CALCIUM 8.6 8.1* --  MG 2.3 -- 2.2  PHOS -- -- 2.6    Basename 05/31/11 0520 05/30/11 0550  WBC 10.4 11.3*  NEUTROABS -- --  HGB 11.4* 11.1*  HCT 35.8* 34.5*  MCV 98.6 97.2  PLT 137* 139*    Basename 05/29/11 2047  HGBA1C 7.1*    Basename 05/29/11 2047  CHOL 122  HDL 18*  LDLCALC 77  TRIG 130  CHOLHDL 6.8  LDLDIRECT --    Basename 05/29/11 2047  TSH 0.828  T4TOTAL --  T3FREE --  THYROIDAB --    Micro Results: Recent Results (from the past 240 hour(s))  STOOL CULTURE     Status: Normal (Preliminary result)   Collection Time   05/29/11  7:37 PM      Component Value Range Status Comment   Specimen Description STOOL   Final    Special Requests NONE   Final    Culture     Final    Value: NO SUSPICIOUS COLONIES, CONTINUING TO  HOLD     Note: REDUCED NORMAL FLORA PRESENT   Report Status PENDING   Incomplete   CLOSTRIDIUM DIFFICILE BY PCR     Status: Abnormal   Collection Time   05/29/11  7:38 PM      Component Value Range Status Comment   C difficile by pcr POSITIVE (*) NEGATIVE  Final   URINE CULTURE     Status: Normal   Collection Time   05/30/11  2:15 AM      Component Value Range Status Comment   Specimen Description URINE, CLEAN CATCH   Final    Special Requests NONE   Final    Setup Time [id number]   Final    Colony Count >=100,000 COLONIES/ML   Final    Culture ENTEROCOCCUS SPECIES   Final    Report Status 06/01/2011 FINAL   Final    Organism ID, Bacteria ENTEROCOCCUS SPECIES   Final   MRSA PCR SCREENING     Status: Abnormal   Collection Time   06/01/11  2:36 AM  Component Value Range Status Comment   MRSA by PCR POSITIVE (*) NEGATIVE  Final     Studies/Results: No results found.  Medications: I have reviewed the patient's current medications.   Principal Problem:  *Clostridium difficile colitis Active Problems:  Paroxysmal a-fib  Diabetes mellitus  Hypertension  Hyperlipidemia  Hypokalemia  Leukocytosis  UTI (lower urinary tract infection)  Hypothyroidism    Assessment and plan: -Patient actually doing quite well. We'll continue his vancomycin by mouth. We'll continue this for a total of 2 weeks. For range transportation back to skilled nursing facility tomorrow morning. His leukocytosis is resolved, hypokalemia resolved.  -Rate controlled no changes were made to his paroxysmal A. fib his blood glucose was well controlled, and blood pressure is stable.   LOS: 3 days   Marinda Elk M.D. Pager: [phone] [facility] 06/01/2011, 4:02 PM

## 2011-06-02 LAB — PROTIME-INR: Prothrombin Time: 36.2 seconds — ABNORMAL HIGH (ref 11.6–15.2)

## 2011-06-02 LAB — STOOL CULTURE

## 2011-06-02 NOTE — Progress Notes (Signed)
Subjective:  Patient relates his abdominal pain is gone. He is able to tolerate his diet well. He had one bowel movement today it is more formed. Objective: Filed Vitals:   06/01/11 0500 06/01/11 1508 06/01/11 2100 06/02/11 0500  BP: 150/80 102/57 108/68 106/66  Pulse: 81 78 82 91  Temp: 97.4 F (36.3 C) 97.8 F (36.6 C) 98.8 F (37.1 C) 98.2 F (36.8 C)  TempSrc: Oral Oral Oral Oral  Resp: 18 18 18 18   Height:      Weight:      SpO2: 94% 98% 95% 95%   Weight change:   Intake/Output Summary (Last 24 hours) at 06/02/11 1236 Last data filed at 06/02/11 1016  Gross per 24 hour  Intake      0 ml  Output    200 ml  Net   -200 ml    General: Alert, awake, oriented x3, in no acute distress.  HEENT: No bruits, no goiter.  Heart: Regular rate and rhythm, without murmurs, rubs, gallops.  Lungs: Good air movement and clear to auscultation Abdomen: Soft, nontender, nondistended, positive bowel sounds.  Neuro: Grossly intact, nonfocal.   Lab Results:  [facility] 05/31/11 0520  NA 147*  K 4.2  CL 117*  CO2 24  GLUCOSE 140*  BUN 14  CREATININE 0.77  CALCIUM 8.6  MG 2.3  PHOS --    Basename 05/31/11 0520  WBC 10.4  NEUTROABS --  HGB 11.4*  HCT 35.8*  MCV 98.6  PLT 137*    Micro Results: Recent Results (from the past 240 hour(s))  STOOL CULTURE     Status: Normal   Collection Time   05/29/11  7:37 PM      Component Value Range Status Comment   Specimen Description STOOL   Final    Special Requests NONE   Final    Culture     Final    Value: NO SALMONELLA, SHIGELLA, CAMPYLOBACTER, OR YERSINIA ISOLATED     Note: REDUCED NORMAL FLORA PRESENT   Report Status 06/02/2011 FINAL   Final   CLOSTRIDIUM DIFFICILE BY PCR     Status: Abnormal   Collection Time   05/29/11  7:38 PM      Component Value Range Status Comment   C difficile by pcr POSITIVE (*) NEGATIVE  Final   URINE CULTURE     Status: Normal   Collection Time   05/30/11  2:15 AM      Component Value Range  Status Comment   Specimen Description URINE, CLEAN CATCH   Final    Special Requests NONE   Final    Setup Time [id number]   Final    Colony Count >=100,000 COLONIES/ML   Final    Culture ENTEROCOCCUS SPECIES   Final    Report Status 06/01/2011 FINAL   Final    Organism ID, Bacteria ENTEROCOCCUS SPECIES   Final   MRSA PCR SCREENING     Status: Abnormal   Collection Time   06/01/11  2:36 AM      Component Value Range Status Comment   MRSA by PCR POSITIVE (*) NEGATIVE  Final     Studies/Results: No results found.  Medications: I have reviewed the patient's current medications.   Principal Problem:  *Clostridium difficile colitis Active Problems:  Paroxysmal a-fib  Diabetes mellitus  Hypertension  Hyperlipidemia  Hypothyroidism    Assessment and plan: -no further BM. Wants to go to SNF today. His urine culture did show >100,000 colonies of enterococcus.  Due to his C.dif infection will not treat at this time, the patient is asymptomatic (no dysuria or suprapubic pain) has improved clinically with his C. Dif treatment. d/c Cipro and observed him for 48hrs with no symptoms. Will follow up at SNF.   LOS: 4 days   Marinda Elk M.D. Pager: [phone] [facility] 06/02/2011, 12:36 PM

## 2011-06-02 NOTE — Progress Notes (Signed)
ANTICOAGULATION CONSULT NOTE - Follow Up Consult  Pharmacy Consult for Coumadin Indication: atrial fibrillation  Assessment: 77 yo male on coumadin PTA for afib, with INR today increased to 3.57, likely due to poor PO intake in the setting of Cdiff infection.  No bleeding issues reported.  Being discharged today to SNF.  Pharmacy System-Based Medication Review: Infectious Diseases: Cdiff being treated with PO vanco 125mg  QID & Florastor x2 weeks, afebrile, WBC down to 10.4-->11.3, UCx=enterococcus sp. >100k, follow-up antibiotic plans Cardiovascular: PAF- tachycardia/hypotension resolved, on home bisoprolol  Gastrointestinal: abdominal pain improved, tolerating diet well Endocrinology: DMII- CBGs controlled Fluids/Electrolytes/Nutrition: Glucerna supplements, hypernatremia Nephrology: CrCl~58, stable Best Practices: coumadin, home meds addressed  Goal of Therapy:  INR 2-3   Plan:  Recommend to hold coumadin again today & have INR checked at [facility] tomorrow before restarting coumadin home dose.  Ival Bible [id number] 06/02/2011 9:52 AM   Allergies  Allergen Reactions  . Morphine And Related Other (See Comments)    Patient Measurements: Height: 6\' 1"  (185.4 cm) Weight: 198 lb 10.2 oz (90.1 kg) IBW/kg (Calculated) : 79.9   Vital Signs: Temp: 98.2 F (36.8 C) (12/21 0500) Temp src: Oral (12/21 0500) BP: 106/66 mmHg (12/21 0500) Pulse Rate: 91  (12/21 0500)  Labs:  Basename 06/02/11 0605 06/01/11 0651 05/31/11 0520  HGB -- -- 11.4*  HCT -- -- 35.8*  PLT -- -- 137*  APTT -- -- --  LABPROT 36.2* 34.9* 33.1*  INR 3.57* 3.41* 3.18*  HEPARINUNFRC -- -- --  CREATININE -- -- 0.77  CKTOTAL -- -- --  CKMB -- -- --  TROPONINI -- -- --   Estimated Creatinine Clearance: 58.3 ml/min (by C-G formula based on Cr of 0.77).   Medications:  Scheduled:     . aspirin EC  81 mg Oral Daily  . bisoprolol  10 mg Oral Daily  . Chlorhexidine Gluconate Cloth  6 each Topical  Q0600  . ezetimibe  10 mg Oral Daily  . feeding supplement  237 mL Oral TID WC  . finasteride  5 mg Oral Daily  . gemfibrozil  600 mg Oral BID AC  . guaiFENesin  600 mg Oral BID  . hydrocortisone cream  1 application Topical BID  . insulin aspart  0-9 Units Subcutaneous Q4H  . levothyroxine  25 mcg Oral QAC breakfast  . mirtazapine  7.5 mg Oral QHS  . mupirocin ointment  1 application Nasal BID  . potassium chloride  40 mEq Oral Daily  . saccharomyces boulardii  250 mg Oral BID  . Tamsulosin HCl  0.4 mg Oral QHS  . vancomycin  125 mg Oral Q6H  . white petrolatum-corn starch-lanolin   Topical Daily   Infusions:     . dextrose 1,000 mL (06/01/11 1130)  . DISCONTD: dextrose 5 % and 0.45% NaCl 75 mL/hr at 05/31/11 1657

## 2011-06-02 NOTE — Progress Notes (Signed)
Clinical Social Worker received insurance authorization for pt to return to SNF. Clinical Social Worker facilitated pt D/C needs including contacting facility, family, and arranging ambulance transportation to [facility]. No further social work needs at this time.  Jacklynn Lewis, MSW, LCSWA  Clinical Social Work [phone]

## 2011-06-02 NOTE — Progress Notes (Signed)
Nutrition Follow-up  BSE completed on 12/19 recommending Regular diet with thin liquids, however per SLP's note pt prefers to stay on full liquids. Consuming 100% of meals. Drinking Glucerna as scheduled.  Diet Order:  Full Liquid Supplements: Glucerna Shakes TID  Meds: Scheduled Meds:   . aspirin EC  81 mg Oral Daily  . bisoprolol  10 mg Oral Daily  . Chlorhexidine Gluconate Cloth  6 each Topical Q0600  . ezetimibe  10 mg Oral Daily  . feeding supplement  237 mL Oral TID WC  . finasteride  5 mg Oral Daily  . gemfibrozil  600 mg Oral BID AC  . guaiFENesin  600 mg Oral BID  . hydrocortisone cream  1 application Topical BID  . insulin aspart  0-9 Units Subcutaneous Q4H  . levothyroxine  25 mcg Oral QAC breakfast  . mirtazapine  7.5 mg Oral QHS  . mupirocin ointment  1 application Nasal BID  . potassium chloride  40 mEq Oral Daily  . saccharomyces boulardii  250 mg Oral BID  . Tamsulosin HCl  0.4 mg Oral QHS  . vancomycin  125 mg Oral Q6H  . white petrolatum-corn starch-lanolin   Topical Daily   Continuous Infusions:   . dextrose 1,000 mL (06/01/11 1130)   PRN Meds:.acetaminophen, ondansetron (ZOFRAN) IV, ondansetron, zolpidem  Labs:  CMP     Component Value Date/Time   NA 147* 05/31/2011 0520   K 4.2 05/31/2011 0520   CL 117* 05/31/2011 0520   CO2 24 05/31/2011 0520   GLUCOSE 140* 05/31/2011 0520   BUN 14 05/31/2011 0520   CREATININE 0.77 05/31/2011 0520   CALCIUM 8.6 05/31/2011 0520   PROT 7.2 05/29/2011 1350   ALBUMIN 2.7* 05/29/2011 1350   AST 22 05/29/2011 1350   ALT 15 05/29/2011 1350   ALKPHOS 62 05/29/2011 1350   BILITOT 0.4 05/29/2011 1350   GFRNONAA 73* 05/31/2011 0520   GFRAA 85* 05/31/2011 0520     Intake/Output Summary (Last 24 hours) at 06/02/11 1146 Last data filed at 06/02/11 1016  Gross per 24 hour  Intake      0 ml  Output    200 ml  Net   -200 ml   Weight Status:  90.1 kg  Nutrition Dx:  Malnutrition, ongoing.  Goal:  Meet >/= 90%  estimated nutrition needs to prevent further wt loss. Met.  Intervention:   1. Continue current interventions in place.  2. RD to follow nutrition care plan. 3. Discussed continuing Glucerna Shakes at home.  Monitor:  Weights, labs, PO intake, I/O's   Adair Laundry Pager #:  [phone]

## 2011-06-25 ENCOUNTER — Encounter (HOSPITAL_COMMUNITY): Payer: Self-pay | Admitting: *Deleted

## 2011-06-25 ENCOUNTER — Other Ambulatory Visit: Payer: Self-pay

## 2011-06-25 ENCOUNTER — Emergency Department (HOSPITAL_COMMUNITY): Payer: Medicare Other

## 2011-06-25 ENCOUNTER — Inpatient Hospital Stay (HOSPITAL_COMMUNITY)
Admission: EM | Admit: 2011-06-25 | Discharge: 2011-06-30 | DRG: 371 | Disposition: A | Discharge status: Skilled Nursing Facility | Payer: Medicare Other | Attending: Internal Medicine | Admitting: Internal Medicine

## 2011-06-25 DIAGNOSIS — E039 Hypothyroidism, unspecified: Secondary | ICD-10-CM | POA: Diagnosis present

## 2011-06-25 DIAGNOSIS — I10 Essential (primary) hypertension: Secondary | ICD-10-CM | POA: Diagnosis present

## 2011-06-25 DIAGNOSIS — I2510 Atherosclerotic heart disease of native coronary artery without angina pectoris: Secondary | ICD-10-CM | POA: Diagnosis present

## 2011-06-25 DIAGNOSIS — R531 Weakness: Secondary | ICD-10-CM | POA: Diagnosis present

## 2011-06-25 DIAGNOSIS — A0471 Enterocolitis due to Clostridium difficile, recurrent: Secondary | ICD-10-CM | POA: Diagnosis present

## 2011-06-25 DIAGNOSIS — E43 Unspecified severe protein-calorie malnutrition: Secondary | ICD-10-CM | POA: Diagnosis present

## 2011-06-25 DIAGNOSIS — I252 Old myocardial infarction: Secondary | ICD-10-CM

## 2011-06-25 DIAGNOSIS — I4891 Unspecified atrial fibrillation: Secondary | ICD-10-CM | POA: Diagnosis present

## 2011-06-25 DIAGNOSIS — E860 Dehydration: Secondary | ICD-10-CM | POA: Diagnosis present

## 2011-06-25 DIAGNOSIS — R5381 Other malaise: Secondary | ICD-10-CM | POA: Diagnosis present

## 2011-06-25 DIAGNOSIS — R197 Diarrhea, unspecified: Secondary | ICD-10-CM | POA: Diagnosis present

## 2011-06-25 DIAGNOSIS — Z951 Presence of aortocoronary bypass graft: Secondary | ICD-10-CM

## 2011-06-25 DIAGNOSIS — E119 Type 2 diabetes mellitus without complications: Secondary | ICD-10-CM | POA: Diagnosis present

## 2011-06-25 DIAGNOSIS — A0472 Enterocolitis due to Clostridium difficile, not specified as recurrent: Principal | ICD-10-CM | POA: Diagnosis present

## 2011-06-25 LAB — CULTURE, BLOOD (ROUTINE X 2): Culture: NO GROWTH

## 2011-06-25 LAB — COMPREHENSIVE METABOLIC PANEL
ALT: 5 U/L (ref 0–53)
Alkaline Phosphatase: 72 U/L (ref 39–117)
BUN: 13 mg/dL (ref 6–23)
CO2: 24 mEq/L (ref 19–32)
GFR calc Af Amer: 79 mL/min — ABNORMAL LOW (ref >90)
GFR calc non Af Amer: 68 mL/min — ABNORMAL LOW (ref >90)
Glucose, Bld: 178 mg/dL — ABNORMAL HIGH (ref 70–99)
Potassium: 3.5 mEq/L (ref 3.5–5.1)
Sodium: 140 mEq/L (ref 135–145)

## 2011-06-25 LAB — DIFFERENTIAL
Eosinophils Absolute: 0 10*3/uL (ref 0.0–0.7)
Eosinophils Relative: 0 % (ref 0–5)
Lymphocytes Relative: 14 % (ref 12–46)
Lymphs Abs: 1.5 10*3/uL (ref 0.7–4.0)
Monocytes Relative: 15 % — ABNORMAL HIGH (ref 3–12)

## 2011-06-25 LAB — URINALYSIS, ROUTINE W REFLEX MICROSCOPIC
Bilirubin Urine: NEGATIVE
Leukocytes, UA: NEGATIVE
Nitrite: NEGATIVE
Protein, ur: 30 mg/dL — AB
Urobilinogen, UA: 0.2 mg/dL (ref 0.0–1.0)

## 2011-06-25 LAB — LACTIC ACID, PLASMA: Lactic Acid, Venous: 1.3 mmol/L (ref 0.5–2.2)

## 2011-06-25 LAB — CBC
Hemoglobin: 12 g/dL — ABNORMAL LOW (ref 13.0–17.0)
MCH: 32.3 pg (ref 26.0–34.0)
MCV: 97.3 fL (ref 78.0–100.0)
Platelets: 93 10*3/uL — ABNORMAL LOW (ref 150–400)
RBC: 3.72 MIL/uL — ABNORMAL LOW (ref 4.22–5.81)
WBC: 11.3 10*3/uL — ABNORMAL HIGH (ref 4.0–10.5)

## 2011-06-25 LAB — POCT I-STAT TROPONIN I: Troponin i, poc: 0.01 ng/mL (ref 0.00–0.08)

## 2011-06-25 LAB — HEMOGLOBIN A1C: Hgb A1c MFr Bld: 6.6 % — ABNORMAL HIGH (ref <5.7)

## 2011-06-25 LAB — URINE MICROSCOPIC-ADD ON

## 2011-06-25 LAB — PROTIME-INR
INR: 2.89 — ABNORMAL HIGH (ref 0.00–1.49)
Prothrombin Time: 30.7 seconds — ABNORMAL HIGH (ref 11.6–15.2)

## 2011-06-25 LAB — GLUCOSE, CAPILLARY
Glucose-Capillary: 128 mg/dL — ABNORMAL HIGH (ref 70–99)
Glucose-Capillary: 104 mg/dL — ABNORMAL HIGH (ref 70–99)

## 2011-06-25 LAB — URINE CULTURE

## 2011-06-25 LAB — CLOSTRIDIUM DIFFICILE BY PCR: Toxigenic C. Difficile by PCR: POSITIVE — AB

## 2011-06-25 MED ORDER — VANCOMYCIN 50 MG/ML ORAL SOLUTION
125.0000 mg | Freq: Four times a day (QID) | ORAL | Status: DC
  Administered 2011-06-25 – 2011-06-30 (×21): 125 mg via ORAL
  Filled 2011-06-25 (×29): qty 2.5

## 2011-06-25 MED ORDER — SODIUM CHLORIDE 0.9 % IV SOLN
Freq: Once | INTRAVENOUS | Status: AC
  Administered 2011-06-25: 09:00:00 via INTRAVENOUS

## 2011-06-25 MED ORDER — HYDROCORTISONE 1 % EX CREA
1.0000 "application " | TOPICAL_CREAM | Freq: Two times a day (BID) | CUTANEOUS | Status: DC
  Administered 2011-06-25 – 2011-06-30 (×10): 1 via TOPICAL
  Filled 2011-06-25 (×2): qty 28

## 2011-06-25 MED ORDER — TAMSULOSIN HCL 0.4 MG PO CAPS
0.4000 mg | ORAL_CAPSULE | ORAL | Status: DC
  Administered 2011-06-26 – 2011-06-30 (×5): 0.4 mg via ORAL
  Filled 2011-06-25 (×6): qty 1

## 2011-06-25 MED ORDER — MIRTAZAPINE 7.5 MG PO TABS
7.5000 mg | ORAL_TABLET | Freq: Every day | ORAL | Status: DC
  Administered 2011-06-25 – 2011-06-29 (×5): 7.5 mg via ORAL
  Filled 2011-06-25 (×7): qty 1

## 2011-06-25 MED ORDER — WARFARIN SODIUM 2 MG PO TABS
2.0000 mg | ORAL_TABLET | Freq: Once | ORAL | Status: AC
  Administered 2011-06-25: 2 mg via ORAL
  Filled 2011-06-25: qty 1

## 2011-06-25 MED ORDER — LEVOTHYROXINE SODIUM 25 MCG PO TABS
25.0000 ug | ORAL_TABLET | Freq: Every day | ORAL | Status: DC
  Administered 2011-06-25 – 2011-06-30 (×6): 25 ug via ORAL
  Filled 2011-06-25 (×6): qty 1

## 2011-06-25 MED ORDER — SODIUM CHLORIDE 0.9 % IV SOLN: Freq: Once | INTRAVENOUS | Status: DC

## 2011-06-25 MED ORDER — SODIUM CHLORIDE 0.9 % IV SOLN
INTRAVENOUS | Status: DC
  Administered 2011-06-25: 1000 mL via INTRAVENOUS
  Administered 2011-06-25 – 2011-06-28 (×5): via INTRAVENOUS
  Administered 2011-06-29: 20 mL via INTRAVENOUS

## 2011-06-25 MED ORDER — OMEGA-3-ACID ETHYL ESTERS 1 G PO CAPS
1.0000 g | ORAL_CAPSULE | Freq: Every day | ORAL | Status: DC
  Administered 2011-06-25 – 2011-06-30 (×6): 1 g via ORAL
  Filled 2011-06-25 (×6): qty 1

## 2011-06-25 MED ORDER — ZINC OXIDE 11.3 % EX CREA
TOPICAL_CREAM | Freq: Every day | CUTANEOUS | Status: DC
  Administered 2011-06-26 – 2011-06-30 (×5): via TOPICAL
  Filled 2011-06-25 (×2): qty 56

## 2011-06-25 MED ORDER — SODIUM CHLORIDE 0.9 % IV SOLN
Freq: Once | INTRAVENOUS | Status: AC
  Administered 2011-06-25: 500 mL via INTRAVENOUS

## 2011-06-25 MED ORDER — ASPIRIN EC 81 MG PO TBEC
81.0000 mg | DELAYED_RELEASE_TABLET | Freq: Every day | ORAL | Status: DC
  Administered 2011-06-25 – 2011-06-30 (×6): 81 mg via ORAL
  Filled 2011-06-25 (×6): qty 1

## 2011-06-25 MED ORDER — INSULIN ASPART 100 UNIT/ML ~~LOC~~ SOLN
0.0000 [IU] | Freq: Three times a day (TID) | SUBCUTANEOUS | Status: DC
  Administered 2011-06-26: 3 [IU] via SUBCUTANEOUS
  Administered 2011-06-26: 2 [IU] via SUBCUTANEOUS
  Administered 2011-06-27: 1 [IU] via SUBCUTANEOUS
  Administered 2011-06-27: 3 [IU] via SUBCUTANEOUS
  Administered 2011-06-27: 2 [IU] via SUBCUTANEOUS
  Administered 2011-06-28: 1 [IU] via SUBCUTANEOUS
  Administered 2011-06-28: 3 [IU] via SUBCUTANEOUS
  Administered 2011-06-28: 2 [IU] via SUBCUTANEOUS
  Administered 2011-06-29 (×3): 3 [IU] via SUBCUTANEOUS
  Administered 2011-06-30: 2 [IU] via SUBCUTANEOUS
  Filled 2011-06-25: qty 3

## 2011-06-25 MED ORDER — INSULIN ASPART 100 UNIT/ML ~~LOC~~ SOLN: 0.0000 [IU] | Freq: Every day | SUBCUTANEOUS | Status: DC

## 2011-06-25 MED ORDER — FINASTERIDE 5 MG PO TABS
5.0000 mg | ORAL_TABLET | Freq: Every day | ORAL | Status: DC
  Administered 2011-06-25 – 2011-06-30 (×6): 5 mg via ORAL
  Filled 2011-06-25 (×6): qty 1

## 2011-06-25 MED ORDER — SODIUM CHLORIDE 0.9 % IV SOLN: INTRAVENOUS | Status: AC

## 2011-06-25 MED ORDER — LORAZEPAM 0.5 MG PO TABS
0.5000 mg | ORAL_TABLET | Freq: Every evening | ORAL | Status: DC | PRN
  Administered 2011-06-27 – 2011-06-28 (×2): 0.5 mg via ORAL
  Filled 2011-06-25 (×2): qty 1

## 2011-06-25 MED ORDER — GEMFIBROZIL 600 MG PO TABS
600.0000 mg | ORAL_TABLET | Freq: Two times a day (BID) | ORAL | Status: DC
  Administered 2011-06-25 – 2011-06-30 (×10): 600 mg via ORAL
  Filled 2011-06-25 (×11): qty 1

## 2011-06-25 MED ORDER — VITAMIN D3 25 MCG (1000 UNIT) PO TABS
2000.0000 [IU] | ORAL_TABLET | ORAL | Status: DC
  Administered 2011-06-25 – 2011-06-30 (×6): 2000 [IU] via ORAL
  Filled 2011-06-25 (×7): qty 2

## 2011-06-25 MED ORDER — EZETIMIBE 10 MG PO TABS
10.0000 mg | ORAL_TABLET | Freq: Every day | ORAL | Status: DC
  Administered 2011-06-25 – 2011-06-29 (×5): 10 mg via ORAL
  Filled 2011-06-25 (×7): qty 1

## 2011-06-25 MED ORDER — SACCHAROMYCES BOULARDII 250 MG PO CAPS
250.0000 mg | ORAL_CAPSULE | Freq: Two times a day (BID) | ORAL | Status: DC
  Administered 2011-06-25 – 2011-06-30 (×11): 250 mg via ORAL
  Filled 2011-06-25 (×13): qty 1

## 2011-06-25 MED ORDER — OMEGA-3 FATTY ACIDS 1000 MG PO CAPS: 1.0000 g | ORAL_CAPSULE | ORAL | Status: DC

## 2011-06-25 NOTE — ED Notes (Signed)
WRU:[id number]<UJ> Expected date:06/25/11<BR> Expected time: 6:50 AM<BR> Means of arrival:Ambulance<BR> Comments:<BR> Generalized weakness, poor PO intake

## 2011-06-25 NOTE — ED Notes (Signed)
Clean patient bottom put so barrier cream on it and gave him some mouth swabs for mouth

## 2011-06-25 NOTE — ED Provider Notes (Signed)
History     CSN: [id number]  Arrival date & time 06/25/11  4098   First MD Initiated Contact with Patient 06/25/11 [phone]      Chief Complaint  Patient presents with  . Weakness    (Consider location/radiation/quality/duration/timing/severity/associated sxs/prior treatment) Patient is a 78 y.o. male presenting with weakness. The history is provided by the patient and a relative.  Weakness  Additional symptoms include weakness.   a patient with weakness which began today. He has had decreased oral intake for the past week. No vomiting, fever, cough. No recent ureters tract symptoms. Patient is currently in rehabilitation after an admission for C. difficile as well as pneumonia. No diarrhea noted currently. Denies any headaches, chest pain, abdominal pain. Nothing makes the symptoms better or worse. No medications taken prior to arrival.  Past Medical History  Diagnosis Date  . Myocardial infarct   . Diabetes mellitus   . Hypertension   . CHF (congestive heart failure)   . Coronary artery disease   . Dysrhythmia     Past Surgical History  Procedure Date  . Coronary artery bypass graft   . Cholecystectomy     No family history on file.  History  Substance Use Topics  . Smoking status: Former Smoker -- 1.0 packs/day    Types: Cigarettes    Quit date: 05/08/1974  . Smokeless tobacco: Never Used  . Alcohol Use: No     occasionally      Review of Systems  Neurological: Positive for weakness.  All other systems reviewed and are negative.    Allergies  Morphine and related  Home Medications   Current Outpatient Rx  Name Route Sig Dispense Refill  . ACETAMINOPHEN 500 MG PO TABS Oral Take 1,000 mg by mouth at bedtime as needed. For pain     . ASPIRIN 81 MG PO TABS Oral Take 81 mg by mouth daily.      Marland Kitchen BISOPROLOL FUMARATE 5 MG PO TABS Oral Take 10 mg by mouth daily.      Marland Kitchen VITAMIN D 1000 UNITS PO TABS Oral Take 2,000 Units by mouth daily.      Marland Kitchen EZETIMIBE 10 MG  PO TABS Oral Take 10 mg by mouth daily.      Marland Kitchen FINASTERIDE 5 MG PO TABS Oral Take 5 mg by mouth daily.      . OMEGA-3 FATTY ACIDS 1000 MG PO CAPS Oral Take 1 g by mouth daily.      . FUROSEMIDE 20 MG PO TABS Oral Take 20 mg by mouth daily.      Marland Kitchen GEMFIBROZIL 600 MG PO TABS Oral Take 600 mg by mouth 2 (two) times daily before a meal.      . GLYBURIDE 5 MG PO TABS Oral Take 1 tablet (5 mg total) by mouth 2 (two) times daily with a meal. 60 tablet 0  . HYDROCORTISONE 1 % EX CREA Topical Apply 1 application topically 2 (two) times daily. itchy back rash     . LEVOTHYROXINE SODIUM 25 MCG PO TABS Oral Take 25 mcg by mouth daily.      Marland Kitchen CALMOSEPTINE EX Apply externally Apply 1 application topically daily. Apply to buttocks every shift     . MIRTAZAPINE 7.5 MG PO TABS Oral Take 7.5 mg by mouth at bedtime.      Marland Kitchen SACCHAROMYCES BOULARDII 250 MG PO CAPS Oral Take 250 mg by mouth 2 (two) times daily. Take for 14 days     . TAMSULOSIN  HCL 0.4 MG PO CAPS Oral Take 0.4 mg by mouth daily.      . WARFARIN SODIUM 5 MG PO TABS Oral Take 5 mg by mouth. Takes 5mg  on Tues, Thurs, Fri, Sat, & Sun    . WARFARIN SODIUM 7.5 MG PO TABS Oral Take 7.5 mg by mouth daily. Takes 7.5mg  on Mon, & Weds    . ZOLPIDEM TARTRATE 5 MG PO TABS Oral Take 5 mg by mouth at bedtime as needed. For insomnia       BP 111/63  Pulse 112  Temp(Src) 98.5 F (36.9 C) (Oral)  Resp 16  SpO2 97%  Physical Exam  Nursing note and vitals reviewed. Constitutional: He is oriented to person, place, and time. He appears well-developed and well-nourished.  Non-toxic appearance. No distress.  HENT:  Head: Normocephalic and atraumatic.  Eyes: Conjunctivae, EOM and lids are normal. Pupils are equal, round, and reactive to light.  Neck: Normal range of motion. Neck supple. No tracheal deviation present. No mass present.  Cardiovascular: Regular rhythm and normal heart sounds.  Tachycardia present.  Exam reveals no gallop.   No murmur  heard. Pulmonary/Chest: Effort normal and breath sounds normal. No stridor. No respiratory distress. He has no decreased breath sounds. He has no wheezes. He has no rhonchi. He has no rales.  Abdominal: Soft. Normal appearance and bowel sounds are normal. He exhibits no distension. There is no tenderness. There is no rebound and no CVA tenderness.  Musculoskeletal: Normal range of motion. He exhibits no edema and no tenderness.  Neurological: He is alert and oriented to person, place, and time. He has normal strength. No cranial nerve deficit or sensory deficit. GCS eye subscore is 4. GCS verbal subscore is 5. GCS motor subscore is 6.  Skin: Skin is warm and dry. No abrasion and no rash noted.  Psychiatric: His speech is normal. His affect is blunt. He is slowed.    ED Course  Procedures (including critical care time)   Labs Reviewed  CBC  DIFFERENTIAL  COMPREHENSIVE METABOLIC PANEL  URINALYSIS, ROUTINE W REFLEX MICROSCOPIC  URINE CULTURE  PROTIME-INR  I-STAT TROPONIN I  CULTURE, BLOOD (ROUTINE X 2)  CULTURE, BLOOD (ROUTINE X 2)  LACTIC ACID, PLASMA   No results found.   No diagnosis found.    MDM   Date: 06/25/2011  Rate: 110  Rhythm: sinus tachycardia  QRS Axis: normal  Intervals: normal  ST/T Wave abnormalities: nonspecific ST/T changes  Conduction Disutrbances:none  Narrative Interpretation:   Old EKG Reviewed: unchanged   10:47 AM Patient given IV fluids here. Patient will be admitted by [facility] for evaluation of dehydration       Toy Baker, MD 06/25/11 1048

## 2011-06-25 NOTE — H&P (Signed)
PCP:  [name], MD, MD   DOA:  06/25/2011  7:14 AM  Chief Complaint:  Generalized weakness  HPI: This is a 78 years old man who is a nursing home resident was discharge last from the hospital on December 17 after he was hospitalized for C. difficile colitis and treated with Vancocin. He was brought into the hospital today with chief complaint of generalized weakness and poor appetite. Patient also reported that he has been having diarrhea for the last couple of days which he described as watery diarrhea, nonbloody, about 3-4 times a day. He denies any associated nausea, vomiting or abdominal pain. He denies any fever, nasal congestion, cough or shortness of breath. In the ED workup showed low-grade fever and mildly elevated white blood count and we were  consulted to admit for further management.  Allergies: Allergies  Allergen Reactions  . Morphine And Related Other (See Comments)    Reaction unknown    Prior to Admission medications   Medication Sig Start Date End Date Taking? Authorizing Provider  acetaminophen (TYLENOL) 500 MG tablet Take 1,000 mg by mouth at bedtime as needed. For pain    Yes Historical Provider, MD  aspirin 81 MG tablet Take 81 mg by mouth every morning.    Yes Historical Provider, MD  cholecalciferol (VITAMIN D) 1000 UNITS tablet Take 2,000 Units by mouth every morning.    Yes Historical Provider, MD  ezetimibe (ZETIA) 10 MG tablet Take 10 mg by mouth at bedtime.    Yes Historical Provider, MD  finasteride (PROSCAR) 5 MG tablet Take 5 mg by mouth every morning.    Yes Historical Provider, MD  fish oil-omega-3 fatty acids 1000 MG capsule Take 1 g by mouth every morning.    Yes Historical Provider, MD  furosemide (LASIX) 20 MG tablet Take 20 mg by mouth every other day. Hold for SBP < 100.   Yes Historical Provider, MD  gemfibrozil (LOPID) 600 MG tablet Take 600 mg by mouth 2 (two) times daily before a meal.     Yes Historical Provider, MD  glyBURIDE (DIABETA) 5  MG tablet Take 5 mg by mouth.   Yes Jazyiah Yiu  hydrocortisone cream 1 % Apply 1 application topically 2 (two) times daily. itchy back rash    Yes Historical Provider, MD  levothyroxine (SYNTHROID, LEVOTHROID) 25 MCG tablet Take 25 mcg by mouth daily.    Yes Historical Provider, MD  LORazepam (ATIVAN) 0.5 MG tablet Take 0.5 mg by mouth at bedtime as needed. For insomnia.   Yes Historical Provider, MD  Menthol-Zinc Oxide (CALMOSEPTINE EX) Apply 1 application topically daily. Apply to buttocks every shift    Yes Historical Provider, MD  mirtazapine (REMERON) 7.5 MG tablet Take 7.5 mg by mouth at bedtime.     Yes Historical Provider, MD  saccharomyces boulardii (FLORASTOR) 250 MG capsule Take 250 mg by mouth 2 (two) times daily. Take for 14 days   Yes Historical Provider, MD  Tamsulosin HCl (FLOMAX) 0.4 MG CAPS Take 0.4 mg by mouth every morning. Hold for SBP < 100.   Yes Historical Provider, MD  vancomycin (VANCOCIN) 50 mg/mL oral solution Take 1,000 mg by mouth every 6 (six) hours. He took for 10 days. He started on 06/12/11 and completed on 06/21/11.   Yes Historical Provider, MD  warfarin (JANTOVEN) 3 MG tablet Take 3 mg by mouth every evening.   Yes Historical Provider, MD    Past Medical History  Diagnosis Date  . Myocardial infarct   .  Diabetes mellitus   . Hypertension   . CHF (congestive heart failure)   . Coronary artery disease   .  paroxysmal A. fib  Hypothyroidism      Past Surgical History  Procedure Date  . Coronary artery bypass graft   . Cholecystectomy     Social History: He is a resident of [facility], denies smoking or EtOH drinking.  Review of Systems:  Constitutional: Denies fever, chills, diaphoresis,c/o appetite change and fatigue.  HEENT: Denies photophobia, eye pain, redness, hearing loss, ear pain, congestion, sore throat, rhinorrhea, sneezing, mouth sores, trouble swallowing, neck pain, neck stiffness and tinnitus.   Respiratory: Denies SOB, DOE, cough,  chest tightness,  and wheezing.   Cardiovascular: Denies chest pain, palpitations and leg swelling.  Gastrointestinal: Denies nausea, vomiting, abdominal pain, c/odiarrhea, denies  blood in stool and abdominal distention.  Genitourinary: Denies dysuria, urgency, frequency, hematuria, flank pain and difficulty urinating.  Neurological: Denies dizziness, seizures, syncope, weakness, light-headedness, numbness and headaches.     Physical Exam:  Filed Vitals:   06/25/11 0719 06/25/11 0902  BP: 111/63   Pulse: 112   Temp: 98.5 F (36.9 C) 99.2 F (37.3 C)  TempSrc: Oral Rectal  Resp: 16   SpO2: 97%     Constitutional: Vital signs reviewed.  Patient is a mild  acute distress and cooperative with exam. Alert and oriented x3.  Eyes: PERRL, EOMI, conjunctivae normal, No scleral icterus.  dry mucous membranes noted Neck: Supple, Trachea midline normal ROM, No JVD,   Cardiovascular: Tachycardic RRR, S1 normal, S2 normal Pulmonary/Chest: CTAB, no wheezes, rales, or rhonchi Abdominal: Soft. Non-tender, non-distended, bowel sounds are normal, no masses, organomegaly, or guarding present.  Ext: no edema and no cyanosis, pulses palpable bilaterally  Neurological: A&O x3, Strenght is normal and symmetric bilaterally, cranial nerve II-XII are grossly intact, no focal motor deficit, sensory intact to light touch bilaterally.    Labs on Admission:  Results for orders placed during the hospital encounter of 06/25/11 (from the past 48 hour(s))  CBC     Status: Abnormal   Collection Time   06/25/11  8:10 AM      Component Value Range Comment   WBC 11.3 (*) 4.0 - 10.5 (K/uL)    RBC 3.72 (*) 4.22 - 5.81 (MIL/uL)    Hemoglobin 12.0 (*) 13.0 - 17.0 (g/dL)    HCT 16.1 (*) 09.6 - 52.0 (%)    MCV 97.3  78.0 - 100.0 (fL)    MCH 32.3  26.0 - 34.0 (pg)    MCHC 33.1  30.0 - 36.0 (g/dL)    RDW 04.5  40.9 - 81.1 (%)    Platelets 93 (*) 150 - 400 (K/uL)   DIFFERENTIAL     Status: Abnormal   Collection Time    06/25/11  8:10 AM      Component Value Range Comment   Neutrophils Relative 71  43 - 77 (%)    Neutro Abs 8.0 (*) 1.7 - 7.7 (K/uL)    Lymphocytes Relative 14  12 - 46 (%)    Lymphs Abs 1.5  0.7 - 4.0 (K/uL)    Monocytes Relative 15 (*) 3 - 12 (%)    Monocytes Absolute 1.7 (*) 0.1 - 1.0 (K/uL)    Eosinophils Relative 0  0 - 5 (%)    Eosinophils Absolute 0.0  0.0 - 0.7 (K/uL)    Basophils Relative 0  0 - 1 (%)    Basophils Absolute 0.0  0.0 - 0.1 (  K/uL)   COMPREHENSIVE METABOLIC PANEL     Status: Abnormal   Collection Time   06/25/11  8:10 AM      Component Value Range Comment   Sodium 140  135 - 145 (mEq/L)    Potassium 3.5  3.5 - 5.1 (mEq/L)    Chloride 104  96 - 112 (mEq/L)    CO2 24  19 - 32 (mEq/L)    Glucose, Bld 178 (*) 70 - 99 (mg/dL)    BUN 13  6 - 23 (mg/dL)    Creatinine, Ser 4.54  0.50 - 1.35 (mg/dL)    Calcium 8.7  8.4 - 10.5 (mg/dL)    Total Protein 6.9  6.0 - 8.3 (g/dL)    Albumin 2.8 (*) 3.5 - 5.2 (g/dL)    AST 14  0 - 37 (U/L)    ALT 5  0 - 53 (U/L)    Alkaline Phosphatase 72  39 - 117 (U/L)    Total Bilirubin 0.8  0.3 - 1.2 (mg/dL)    GFR calc non Af Amer 68 (*) >90 (mL/min)    GFR calc Af Amer 79 (*) >90 (mL/min)   PROTIME-INR     Status: Abnormal   Collection Time   06/25/11  8:10 AM      Component Value Range Comment   Prothrombin Time 30.7 (*) 11.6 - 15.2 (seconds)    INR 2.89 (*) 0.00 - 1.49    LACTIC ACID, PLASMA     Status: Normal   Collection Time   06/25/11  8:10 AM      Component Value Range Comment   Lactic Acid, Venous 1.3  0.5 - 2.2 (mmol/L)   POCT I-STAT TROPONIN I     Status: Normal   Collection Time   06/25/11  8:47 AM      Component Value Range Comment   Troponin i, poc 0.01  0.00 - 0.08 (ng/mL)    Comment 3            URINALYSIS, ROUTINE W REFLEX MICROSCOPIC     Status: Abnormal   Collection Time   06/25/11  9:02 AM      Component Value Range Comment   Color, Urine AMBER (*) YELLOW  BIOCHEMICALS MAY BE AFFECTED BY COLOR   APPearance  CLEAR  CLEAR     Specific Gravity, Urine 1.024  1.005 - 1.030     pH 5.5  5.0 - 8.0     Glucose, UA NEGATIVE  NEGATIVE (mg/dL)    Hgb urine dipstick NEGATIVE  NEGATIVE     Bilirubin Urine NEGATIVE  NEGATIVE     Ketones, ur TRACE (*) NEGATIVE (mg/dL)    Protein, ur 30 (*) NEGATIVE (mg/dL)    Urobilinogen, UA 0.2  0.0 - 1.0 (mg/dL)    Nitrite NEGATIVE  NEGATIVE     Leukocytes, UA NEGATIVE  NEGATIVE    URINE MICROSCOPIC-ADD ON     Status: Normal   Collection Time   06/25/11  9:02 AM      Component Value Range Comment   Squamous Epithelial / LPF RARE  RARE     RBC / HPF 0-2  <3 (RBC/hpf)    Bacteria, UA RARE  RARE     Urine-Other MUCOUS PRESENT       Radiological Exams on Admission: Dg Chest 2 View  06/25/2011  *RADIOLOGY REPORT*  Clinical Data: Weakness.  Shortness of breath and bodyaches.  CHEST - 2 VIEW  Comparison: 05/29/2011  Findings: Mild hyperinflation. Prior  median sternotomy.  Midline trachea.  Mild cardiomegaly.  Mild left hemidiaphragm elevation. No pleural effusion or pneumothorax.  Left base scarring or atelectasis again identified.  Right lung clear.  IMPRESSION: No acute cardiopulmonary disease.  Original Report Authenticated By: Consuello Bossier, M.D.   Ct Head Wo Contrast  06/25/2011  *RADIOLOGY REPORT*  Clinical Data: Weakness.  CT HEAD WITHOUT CONTRAST  Technique:  Contiguous axial images were obtained from the base of the skull through the vertex without contrast.  Comparison: 05/14/2011  Findings: Bone windows demonstrate surgical changes to bilateral globes.  Mucosal thickening of the ethmoid air cells is improved. Maxillary sinus disease has resolved. Clear mastoid air cells.  Soft tissue windows demonstrate expected cerebral atrophy. Relatively mild for age low density in the periventricular white matter likely related to small vessel disease. No  mass lesion, hemorrhage, hydrocephalus, acute infarct, intra-axial, or extra- axial fluid collection.  IMPRESSION:  1. No acute  intracranial abnormality. 2.  Expected for age cerebral atrophy and small vessel ischemic change. 3.  Improved sinus disease.  Original Report Authenticated By: Consuello Bossier, M.D.   Dg Chest Portable 1 View  05/29/2011  *RADIOLOGY REPORT*  Clinical Data:  Weakness.  Unable to eat or drink.  PORTABLE CHEST - 1 VIEW  Comparison: 05/02/2011.  Findings: Cardiomegaly.  Mild vascular congestion but improved overall CHF.  Slight elevation left hemidiaphragm appears chronic. Retrocardiac density redemonstrated, possible infiltrate or scarring versus atelectasis.  IMPRESSION: Cardiomegaly.   Retrocardiac density could represent infiltrate versus scarring or atelectasis. This appears similar to priors. Overall the CHF is improved.  Original Report Authenticated By: Elsie Stain, M.D.   Assessment/Plan Principal Problem:  *Diarrhea Active Problems:  Weakness generalized  Dehydration Diabetes mellitus Paroxysmal atrial fibrillation History of congestive heart failure Plan Admit to medical floor Gently hydrate with normal saline Keep in contact isolation, send C diff PCR and started on Vancocin, if C. difficile confirmed he will need Vancocin taper for recurrence. Hold oral hypoglycemic agents and place on insulin scale We'll ask pharmacy to dose Coumadin PT consult CODE STATUS discussed with patient who is alert and oriented x3 and he wishes to be full code.  Time Spent on Admission: 45 minutes  Montserrat Shek 06/25/2011, 11:51 AM

## 2011-06-25 NOTE — Progress Notes (Signed)
ANTICOAGULATION CONSULT NOTE - Initial Consult  Pharmacy Consult for Warfarin Indication: atrial fibrillation  Allergies  Allergen Reactions  . Morphine And Related Other (See Comments)    Reaction unknown    Patient Measurements:   Vital Signs: Temp: 97.1 F (36.2 C) (01/13 1200) Temp src: Oral (01/13 1200) BP: 105/57 mmHg (01/13 1200) Pulse Rate: 90  (01/13 1200)  Labs:  Basename 06/25/11 0810  HGB 12.0*  HCT 36.2*  PLT 93*  APTT --  LABPROT 30.7*  INR 2.89*  HEPARINUNFRC --  CREATININE 0.91  CKTOTAL --  CKMB --  TROPONINI --   The CrCl is unknown because both a height and weight (above a minimum accepted value) are required for this calculation.  Medical History: Past Medical History  Diagnosis Date  . Myocardial infarct   . Diabetes mellitus   . Hypertension   . CHF (congestive heart failure)   . Coronary artery disease   . Dysrhythmia     Medications:  Scheduled:    . sodium chloride   Intravenous Once  . aspirin EC  81 mg Oral Daily  . cholecalciferol  2,000 Units Oral Q0700  . finasteride  5 mg Oral Daily  . gemfibrozil  600 mg Oral BID AC  . hydrocortisone cream  1 application Topical BID  . insulin aspart  0-9 Units Subcutaneous TID WC  . levothyroxine  25 mcg Oral QAC breakfast  . omega-3 acid ethyl esters  1 g Oral Daily  . saccharomyces boulardii  250 mg Oral BID  . Tamsulosin HCl  0.4 mg Oral Q0700  . vancomycin  125 mg Oral Q6H  . zinc oxide   Topical Daily  . DISCONTD: sodium chloride   Intravenous Once  . DISCONTD: fish oil-omega-3 fatty acids  1 g Oral Q0700   Infusions:    . sodium chloride     PRN:   Assessment:  78 yo M from NH with hx of A.fib admitted with recurrent C.diff.  Will continue coumadin.  Pharmacy is also following C.diff protocol peripherally.   Dose PTA = warfarin 3 mg Daily  INR on admission 2.89 (goal 2-3)  CBC ok, low plt noted (chronic, were low on last admission at [facility])  No bleeding  reported Goal of Therapy:  INR 2-3   Plan:  1.) Given his current illness, age, and INR at the higher end of goal will give smaller than home dose tonight - Coumadin 2 mg po x 1 tonight 2.) Daily PT/INR 3.) Monitor CBC  Terease Marcotte, Loma Messing 06/25/2011,12:37 PM

## 2011-06-25 NOTE — ED Notes (Signed)
Per EMS:  Pt comes from [facility] facility with a c/o generalized weakness.  Pt called his son from the facility at which he lives complaining of weakness.  The nursing staff then noted the pt to be more lethargic than his norm.  Pt has had decreased PO intake x 1 week.  Staff also states that the pt is generally of good mentation and ambulatory without assistance.  Pt today is unable to move well due to weakness.

## 2011-06-26 LAB — CBC
MCH: 31.5 pg (ref 26.0–34.0)
MCV: 97.8 fL (ref 78.0–100.0)
Platelets: 91 10*3/uL — ABNORMAL LOW (ref 150–400)
RDW: 15.4 % (ref 11.5–15.5)

## 2011-06-26 LAB — BASIC METABOLIC PANEL
BUN: 11 mg/dL (ref 6–23)
CO2: 23 mEq/L (ref 19–32)
Calcium: 8.4 mg/dL (ref 8.4–10.5)
Creatinine, Ser: 0.79 mg/dL (ref 0.50–1.35)

## 2011-06-26 LAB — GLUCOSE, CAPILLARY
Glucose-Capillary: 157 mg/dL — ABNORMAL HIGH (ref 70–99)
Glucose-Capillary: 202 mg/dL — ABNORMAL HIGH (ref 70–99)

## 2011-06-26 LAB — PROTIME-INR: Prothrombin Time: 42.3 seconds — ABNORMAL HIGH (ref 11.6–15.2)

## 2011-06-26 MED ORDER — POTASSIUM CHLORIDE 10 MEQ/100ML IV SOLN
10.0000 meq | INTRAVENOUS | Status: AC
  Administered 2011-06-26 (×4): 10 meq via INTRAVENOUS
  Filled 2011-06-26 (×4): qty 100

## 2011-06-26 MED ORDER — ENSURE CLINICAL ST REVIGOR PO LIQD
237.0000 mL | Freq: Two times a day (BID) | ORAL | Status: DC
  Administered 2011-06-27 – 2011-06-30 (×7): 237 mL via ORAL

## 2011-06-26 NOTE — Progress Notes (Signed)
   CARE MANAGEMENT NOTE 06/26/2011  Patient:  David Davidson, David Davidson   Account Number:  [id number]  Date Initiated:  06/26/2011  Documentation initiated by:  David Davidson  Subjective/Objective Assessment:   ADMITTED W/DIARRHEA.READMIT-DIARRHEA.     Action/Plan:   FROM [facility]-SNF   Anticipated DC Date:  06/30/2011   Anticipated DC Plan:  SKILLED NURSING FACILITY  In-house referral  Clinical Social Worker         Choice offered to / List presented to:             Status of service:  In process, will continue to follow Medicare Important Message given?   (If response is "NO", the following Medicare IM given date Sieler will be blank) Date Medicare IM given:   Date Additional Medicare IM given:    Discharge Disposition:    Per UR Regulation:  Reviewed for med. necessity/level of care/duration of stay  Comments:  06/26/11 Glada Wickstrom RN,BSN NCM [phone]

## 2011-06-26 NOTE — Progress Notes (Signed)
Subjective: Patient seen and examined , still feeling very weak and continued to have watery diarrhea  Objective: Vital signs in last 24 hours: Temp:  [97.1 F (36.2 C)-100.2 F (37.9 C)] 99.2 F (37.3 C) (01/14 0600) Pulse Rate:  [87-111] 111  (01/14 0600) Resp:  [16-25] 17  (01/14 0600) BP: (89-114)/(48-64) 114/64 mmHg (01/14 0600) SpO2:  [90 %-98 %] 91 % (01/14 0600) Weight:  [81.647 kg (180 lb)] 81.647 kg (180 lb) (01/13 1900) Weight change:  Last BM Date: 06/25/11  Intake/Output from previous day: 01/13 0701 - 01/14 0700 In: 1313.8 [I.V.:1313.8] Out: 100 [Urine:100] Total I/O In: -  Out: 100 [Urine:100]   Physical Exam: General: Alert, awake, oriented x3, in mild acute distress. Dry mucous membranes noted Heart: Regular rate and rhythm, without murmurs, rubs, gallops. Lungs: Clear to auscultation bilaterally. Abdomen: Soft, nontender, nondistended, positive bowel sounds. Extremities: No clubbing cyanosis or edema with positive pedal pulses. Neuro: Grossly intact, nonfocal.    Lab Results: Results for orders placed during the hospital encounter of 06/25/11 (from the past 24 hour(s))  CLOSTRIDIUM DIFFICILE BY PCR     Status: Abnormal   Collection Time   06/25/11 12:31 PM      Component Value Range   C difficile by pcr POSITIVE (*) NEGATIVE   GLUCOSE, CAPILLARY     Status: Abnormal   Collection Time   06/25/11  1:23 PM      Component Value Range   Glucose-Capillary 128 (*) 70 - 99 (mg/dL)   Comment 1 Notify RN     Comment 2 Documented in Chart    GLUCOSE, CAPILLARY     Status: Abnormal   Collection Time   06/25/11  5:52 PM      Component Value Range   Glucose-Capillary 118 (*) 70 - 99 (mg/dL)  GLUCOSE, CAPILLARY     Status: Abnormal   Collection Time   06/25/11 10:08 PM      Component Value Range   Glucose-Capillary 104 (*) 70 - 99 (mg/dL)  CBC     Status: Abnormal   Collection Time   06/26/11  4:37 AM      Component Value Range   WBC 9.7  4.0 - 10.5  (K/uL)   RBC 3.59 (*) 4.22 - 5.81 (MIL/uL)   Hemoglobin 11.3 (*) 13.0 - 17.0 (g/dL)   HCT 19.1 (*) 47.8 - 52.0 (%)   MCV 97.8  78.0 - 100.0 (fL)   MCH 31.5  26.0 - 34.0 (pg)   MCHC 32.2  30.0 - 36.0 (g/dL)   RDW 29.5  62.1 - 30.8 (%)   Platelets 91 (*) 150 - 400 (K/uL)  BASIC METABOLIC PANEL     Status: Abnormal   Collection Time   06/26/11  4:37 AM      Component Value Range   Sodium 141  135 - 145 (mEq/L)   Potassium 3.2 (*) 3.5 - 5.1 (mEq/L)   Chloride 108  96 - 112 (mEq/L)   CO2 23  19 - 32 (mEq/L)   Glucose, Bld 115 (*) 70 - 99 (mg/dL)   BUN 11  6 - 23 (mg/dL)   Creatinine, Ser 6.57  0.50 - 1.35 (mg/dL)   Calcium 8.4  8.4 - 84.6 (mg/dL)   GFR calc non Af Amer 72 (*) >90 (mL/min)   GFR calc Af Amer 84 (*) >90 (mL/min)  PROTIME-INR     Status: Abnormal   Collection Time   06/26/11  4:37 AM  Component Value Range   Prothrombin Time 42.3 (*) 11.6 - 15.2 (seconds)   INR 4.36 (*) 0.00 - 1.49   GLUCOSE, CAPILLARY     Status: Abnormal   Collection Time   06/26/11  7:58 AM      Component Value Range   Glucose-Capillary 157 (*) 70 - 99 (mg/dL)    Studies/Results:  Medications:    . sodium chloride   Intravenous Once  . sodium chloride   Intravenous STAT  . sodium chloride   Intravenous Once  . aspirin EC  81 mg Oral Daily  . cholecalciferol  2,000 Units Oral Q0700  . ezetimibe  10 mg Oral QHS  . finasteride  5 mg Oral Daily  . gemfibrozil  600 mg Oral BID AC  . hydrocortisone cream  1 application Topical BID  . insulin aspart  0-5 Units Subcutaneous QHS  . insulin aspart  0-9 Units Subcutaneous TID WC  . levothyroxine  25 mcg Oral QAC breakfast  . mirtazapine  7.5 mg Oral QHS  . omega-3 acid ethyl esters  1 g Oral Daily  . saccharomyces boulardii  250 mg Oral BID  . Tamsulosin HCl  0.4 mg Oral Q0700  . vancomycin  125 mg Oral Q6H  . warfarin  2 mg Oral ONCE-1800  . zinc oxide   Topical Daily  . DISCONTD: fish oil-omega-3 fatty acids  1 g Oral Q0700     LORazepam     . sodium chloride 75 mL/hr at 06/26/11 1017    Assessment/Plan:  Principal Problem:  *Reccurent C diff colitis : Continue IVF and vancocin d#2 will need  taper dose this time.Replete K ,check mag Active Problems:  Weakness generalized PT/OT consult  Dehydration Cont IVF  DM: holding oral hypoglycemics ,continue insulin scale  Chronic Afib: On coumadin ,pharmacy dosing ,supratherapeutic INR noted . Risks and benefits of anticoagulation discussed with daughter. Given his age and frailty with risk of falls, he may not be a suitable candidate for Coumadin Coumadin, I advised the daughter to discuss that with her brother who is her health care power of attorney. For him to give me the final thoughts regarding anticoagulation.    LOS: 1 day   Avamarie Crossley 06/26/2011, 10:19 AM

## 2011-06-26 NOTE — Progress Notes (Signed)
PT Cancellation Note  Treatment cancelled today due to patient's refusal to participate.  Patient complained of fatigue from frequent stooling.  Will continue attempts.  Keyanah Kozicki,CYNDI 06/26/2011, 3:25 PM

## 2011-06-26 NOTE — Progress Notes (Signed)
ANTICOAGULATION CONSULT NOTE - Follow Up Consult  Pharmacy Consult for  Coumadin Indication: atrial fibrillation  Allergies  Allergen Reactions  . Morphine And Related Other (See Comments)    Reaction unknown    Patient Measurements: Height: 6\' 1"  (185.4 cm) Weight: 180 lb (81.647 kg) IBW/kg (Calculated) : 79.9    Vital Signs: Temp: 99.2 F (37.3 C) (01/14 0600) Temp src: Oral (01/14 0600) BP: 114/64 mmHg (01/14 0600) Pulse Rate: 111  (01/14 0600)  Labs:  Basename 06/26/11 0437 06/25/11 0810  HGB 11.3* 12.0*  HCT 35.1* 36.2*  PLT 91* 93*  APTT -- --  LABPROT 42.3* 30.7*  INR 4.36* 2.89*  HEPARINUNFRC -- --  CREATININE 0.79 0.91  CKTOTAL -- --  CKMB -- --  TROPONINI -- --   Estimated Creatinine Clearance: 58.3 ml/min (by C-G formula based on Cr of 0.79).   Medications:  Scheduled:    . sodium chloride   Intravenous STAT  . sodium chloride   Intravenous Once  . aspirin EC  81 mg Oral Daily  . cholecalciferol  2,000 Units Oral Q0700  . ezetimibe  10 mg Oral QHS  . feeding supplement  237 mL Oral BID BM  . finasteride  5 mg Oral Daily  . gemfibrozil  600 mg Oral BID AC  . hydrocortisone cream  1 application Topical BID  . insulin aspart  0-5 Units Subcutaneous QHS  . insulin aspart  0-9 Units Subcutaneous TID WC  . levothyroxine  25 mcg Oral QAC breakfast  . mirtazapine  7.5 mg Oral QHS  . omega-3 acid ethyl esters  1 g Oral Daily  . potassium chloride  10 mEq Intravenous Q1 Hr x 4  . saccharomyces boulardii  250 mg Oral BID  . Tamsulosin HCl  0.4 mg Oral Q0700  . vancomycin  125 mg Oral Q6H  . warfarin  2 mg Oral ONCE-1800  . zinc oxide   Topical Daily   Infusions:    . sodium chloride 75 mL/hr at 06/26/11 1017   PRN: LORazepam  Assessment:  78 yo M from NH with hx of A.fib admitted with recurrent C.diff  INR above goal now - age and illness contributing - waiting for MD/Family decision regarding anticoagulation in such an elderly man  No  coumadin tonight.   CBC stable  No bleeding noted  Goal of Therapy:  INR 2-3   Plan:  No coumadin tonight Follow up need for coumadin/anticoag plan Follow am INR and CBC  Vamsi Apfel, Loma Messing 06/26/2011,3:29 PM

## 2011-06-26 NOTE — Progress Notes (Addendum)
INITIAL ADULT NUTRITION ASSESSMENT Date: 06/26/2011   Time: 2:35 PM Reason for Assessment: Consult, nutrition risk   ASSESSMENT: Male 78 y.o.  Dx: Diarrhea  Hx:  Past Medical History  Diagnosis Date  . Myocardial infarct   . Diabetes mellitus   . Hypertension   . CHF (congestive heart failure)   . Coronary artery disease   . Dysrhythmia    Related Meds:  Scheduled Meds:   . sodium chloride   Intravenous STAT  . sodium chloride   Intravenous Once  . aspirin EC  81 mg Oral Daily  . cholecalciferol  2,000 Units Oral Q0700  . ezetimibe  10 mg Oral QHS  . finasteride  5 mg Oral Daily  . gemfibrozil  600 mg Oral BID AC  . hydrocortisone cream  1 application Topical BID  . insulin aspart  0-5 Units Subcutaneous QHS  . insulin aspart  0-9 Units Subcutaneous TID WC  . levothyroxine  25 mcg Oral QAC breakfast  . mirtazapine  7.5 mg Oral QHS  . omega-3 acid ethyl esters  1 g Oral Daily  . potassium chloride  10 mEq Intravenous Q1 Hr x 4  . saccharomyces boulardii  250 mg Oral BID  . Tamsulosin HCl  0.4 mg Oral Q0700  . vancomycin  125 mg Oral Q6H  . warfarin  2 mg Oral ONCE-1800  . zinc oxide   Topical Daily   Continuous Infusions:   . sodium chloride 75 mL/hr at 06/26/11 1017   PRN Meds:.LORazepam  Ht: 6\' 1"  (185.4 cm)  Wt: 180 lb (81.647 kg)  Ideal Wt: 83.6kg % Ideal Wt: 98  Usual Wt: 95.5kg % Usual Wt: 85  Body mass index is 23.75 kg/(m^2).  Food/Nutrition Related Hx: Pt from [facility], admitted with poor intake for the past 2 months with recent C. Difficile and PNA. Pt admitted with stage 2 pressure ulcer on buttocks. Pt asleep during visit, daughter present at bedside. She reports pt with 30 pound unintentional weight loss in the past 2 months. She reports pt without any difficulty swallowing. She reports pt on Ensure at facility and typically able to eat independently without assistance. She reports pt with 4 loose stools so far today.   Labs:  CMP       Component Value Date/Time   NA 141 06/26/2011 0437   K 3.2* 06/26/2011 0437   CL 108 06/26/2011 0437   CO2 23 06/26/2011 0437   GLUCOSE 115* 06/26/2011 0437   BUN 11 06/26/2011 0437   CREATININE 0.79 06/26/2011 0437   CALCIUM 8.4 06/26/2011 0437   PROT 6.9 06/25/2011 0810   ALBUMIN 2.8* 06/25/2011 0810   AST 14 06/25/2011 0810   ALT 5 06/25/2011 0810   ALKPHOS 72 06/25/2011 0810   BILITOT 0.8 06/25/2011 0810   GFRNONAA 72* 06/26/2011 0437   GFRAA 84* 06/26/2011 0437   - Noted pt with low potassium, likely r/t dehydration on admission   CBG (last 3)   Basename 06/26/11 0758 06/25/11 2208 06/25/11 1752  GLUCAP 157* 104* 118*   Lab Results  Component Value Date   HGBA1C 6.6* 06/25/2011    Intake/Output Summary (Last 24 hours) at 06/26/11 1439 Last data filed at 06/26/11 0836  Gross per 24 hour  Intake 1313.75 ml  Output    200 ml  Net 1113.75 ml  Last BM - 4 loose stools today per daughter's statement  Diet Order: General  IVF:    sodium chloride Last Rate: 75 mL/hr at  06/26/11 1017    Estimated Nutritional Needs:   Kcal:2050-2450 Protein:100-115g Fluid:2-2.4L  NUTRITION DIAGNOSIS: -Inadequate oral intake (NI-2.1).  Status: Ongoing -Pt with severe PCM of chronic illness AEB 14% weight loss and <75% energy intake in the past month in addition to stage 2 pressure ulcer on buttocks   RELATED TO: C. Difficile PTA, poor appetite  AS EVIDENCE BY: daughter's statement, weight loss PTA  MONITORING/EVALUATION(Goals): Pt to consume >75% of meals/supplements to aid in improving strength and wound healing.   EDUCATION NEEDS: -No education needs identified at this time - daughter aware of pt's need to eat better for wound healing and strength building  INTERVENTION: Chocolate Ensure Clinical Strength BID. Magic Cup BID. Nursing to offer ice cream throughout the day per pt preference. Hopefully as diarrhea improves and as IVF helps with dehydration that was present on admission,  appetite will improve. Pt also on Remeron, which pt was on PTA, which will also help with poor appetite. Will monitor.   Dietitian #: [phone]  DOCUMENTATION CODES Per approved criteria  -Severe malnutrition in the context of chronic illness    Marshall Cork 06/26/2011, 2:35 PM

## 2011-06-27 LAB — BASIC METABOLIC PANEL
BUN: 14 mg/dL (ref 6–23)
Calcium: 7.9 mg/dL — ABNORMAL LOW (ref 8.4–10.5)
Creatinine, Ser: 0.78 mg/dL (ref 0.50–1.35)
GFR calc Af Amer: 84 mL/min — ABNORMAL LOW (ref >90)
GFR calc non Af Amer: 73 mL/min — ABNORMAL LOW (ref >90)
Glucose, Bld: 126 mg/dL — ABNORMAL HIGH (ref 70–99)
Potassium: 3.2 mEq/L — ABNORMAL LOW (ref 3.5–5.1)

## 2011-06-27 LAB — GLUCOSE, CAPILLARY
Glucose-Capillary: 236 mg/dL — ABNORMAL HIGH (ref 70–99)
Glucose-Capillary: 163 mg/dL — ABNORMAL HIGH (ref 70–99)

## 2011-06-27 LAB — PROTIME-INR: Prothrombin Time: 48 seconds — ABNORMAL HIGH (ref 11.6–15.2)

## 2011-06-27 MED ORDER — POTASSIUM CHLORIDE 10 MEQ/100ML IV SOLN
10.0000 meq | INTRAVENOUS | Status: AC
  Administered 2011-06-27 (×4): 10 meq via INTRAVENOUS
  Filled 2011-06-27 (×4): qty 100

## 2011-06-27 NOTE — Progress Notes (Addendum)
Subjective: Patient seen and examined,continued to have watery ,non bloody diarrhea.  Objective: Vital signs in last 24 hours: Temp:  [97.6 F (36.4 C)-97.9 F (36.6 C)] 97.6 F (36.4 C) (01/15 1415) Pulse Rate:  [98-110] 110  (01/15 1415) Resp:  [16-18] 16  (01/15 1415) BP: (93-96)/(52-64) 96/64 mmHg (01/15 1415) SpO2:  [94 %-96 %] 96 % (01/15 1415) Weight change:  Last BM Date: 06/27/11  Intake/Output from previous day: 01/14 0701 - 01/15 0700 In: 500 [I.V.:500] Out: 625 [Urine:625]     Physical Exam:  General: Alert, awake, oriented x3, in mild acute distress.  Dry mucous membranes noted  Heart: Regular rate and rhythm, without murmurs, rubs, gallops.  Lungs: Clear to auscultation bilaterally.  Abdomen: Soft, nontender, nondistended, positive bowel sounds.  Extremities: No clubbing cyanosis or edema with positive pedal pulses.  Neuro: Grossly intact, nonfocal.       Lab Results: Results for orders placed during the hospital encounter of 06/25/11 (from the past 24 hour(s))  GLUCOSE, CAPILLARY     Status: Abnormal   Collection Time   06/26/11 10:19 PM      Component Value Range   Glucose-Capillary 178 (*) 70 - 99 (mg/dL)  BASIC METABOLIC PANEL     Status: Abnormal   Collection Time   06/27/11  3:48 AM      Component Value Range   Sodium 141  135 - 145 (mEq/L)   Potassium 3.2 (*) 3.5 - 5.1 (mEq/L)   Chloride 110  96 - 112 (mEq/L)   CO2 23  19 - 32 (mEq/L)   Glucose, Bld 126 (*) 70 - 99 (mg/dL)   BUN 14  6 - 23 (mg/dL)   Creatinine, Ser 1.61  0.50 - 1.35 (mg/dL)   Calcium 7.9 (*) 8.4 - 10.5 (mg/dL)   GFR calc non Af Amer 73 (*) >90 (mL/min)   GFR calc Af Amer 84 (*) >90 (mL/min)  MAGNESIUM     Status: Normal   Collection Time   06/27/11  3:48 AM      Component Value Range   Magnesium 2.0  1.5 - 2.5 (mg/dL)  GLUCOSE, CAPILLARY     Status: Abnormal   Collection Time   06/27/11  8:37 AM      Component Value Range   Glucose-Capillary 129 (*) 70 - 99 (mg/dL)     Comment 1 Notify RN     Comment 2 Documented in Chart    PROTIME-INR     Status: Abnormal   Collection Time   06/27/11  9:51 AM      Component Value Range   Prothrombin Time 48.0 (*) 11.6 - 15.2 (seconds)   INR 5.12 (*) 0.00 - 1.49   GLUCOSE, CAPILLARY     Status: Abnormal   Collection Time   06/27/11 12:21 PM      Component Value Range   Glucose-Capillary 236 (*) 70 - 99 (mg/dL)  GLUCOSE, CAPILLARY     Status: Abnormal   Collection Time   06/27/11  5:40 PM      Component Value Range   Glucose-Capillary 163 (*) 70 - 99 (mg/dL)    Studies/Results: No results found.  Medications:    . aspirin EC  81 mg Oral Daily  . cholecalciferol  2,000 Units Oral Q0700  . ezetimibe  10 mg Oral QHS  . feeding supplement  237 mL Oral BID BM  . finasteride  5 mg Oral Daily  . gemfibrozil  600 mg Oral BID AC  .  hydrocortisone cream  1 application Topical BID  . insulin aspart  0-5 Units Subcutaneous QHS  . insulin aspart  0-9 Units Subcutaneous TID WC  . levothyroxine  25 mcg Oral QAC breakfast  . mirtazapine  7.5 mg Oral QHS  . omega-3 acid ethyl esters  1 g Oral Daily  . potassium chloride  10 mEq Intravenous Q1 Hr x 4  . saccharomyces boulardii  250 mg Oral BID  . Tamsulosin HCl  0.4 mg Oral Q0700  . vancomycin  125 mg Oral Q6H  . zinc oxide   Topical Daily    LORazepam     . sodium chloride 75 mL/hr at 06/27/11 1728    Assessment/Plan: Principal Problem:  *Reccurent C diff colitis :  Continue IVF and vancocin d#3 will need taper dose this time.Replete K , mag WNL  Active Problems:  Weakness generalized  Continue PT/OT Dehydration  Cont IVF  DM: holding oral hypoglycemics ,continue insulin scale  Chronic Afib:  supratherapeutic INR noted . Coumadin on hold.Risks and benefits of anticoagulation discussed with daughter and son . Given his age and frailty with risk of falls, he may not be a suitable candidate for Coumadin,they verbalized understanding.As per  Son [facility])  ,he will think about it and get back to [place]. Sever PCM: continue supplemnts as per dietician recommendations.    LOS: 2 days   Taige Housman 06/27/2011, 8:18 PM

## 2011-06-27 NOTE — Progress Notes (Signed)
CSW met with patient and patients son at bedside regarding discharge planning. Patients son does not want patient to return to [facility]. He is requesting information be sent to [facility] and [facility]. Psychosocial assessment completed and placed in shadow chart. FL-2 completed and faxed out.  Reyann Troop C. Kashira Behunin MSW, LCSW [phone]

## 2011-06-27 NOTE — Progress Notes (Signed)
Physical Therapy Evaluation Patient Details Name: David Davidson MRN: [id number] DOB: 12-Mar-1911 Today's Date: 06/27/2011 1030-1054 Ev 2  Problem List:  Patient Active Problem List  Diagnoses  . CAD (coronary artery disease)  . Paroxysmal a-fib  . Diabetes mellitus  . Hypertension  . S/P CABG (coronary artery bypass graft)  . Hyperlipidemia  . Hypothyroidism  . Diarrhea  . Weakness generalized  . Dehydration    Past Medical History:  Past Medical History  Diagnosis Date  . Myocardial infarct   . Diabetes mellitus   . Hypertension   . CHF (congestive heart failure)   . Coronary artery disease   . Dysrhythmia    Past Surgical History:  Past Surgical History  Procedure Date  . Coronary artery bypass graft   . Cholecystectomy     PT Assessment/Plan/Recommendation PT Assessment Clinical Impression Statement: this very pleasant pt will benefit from PT to maximize independence for next venue of care;  Given this is  pt's second hospital adm in 2months we will recommend STNF vs CIR to facilitate pt returning to independent  lifestyle PT Recommendation/Assessment: Patient will need skilled PT in the acute care venue PT Problem List: Decreased activity tolerance;Decreased balance;Decreased mobility;Decreased knowledge of use of DME Barriers to Discharge: None PT Therapy Diagnosis : Difficulty walking PT Plan PT Frequency: Min 3X/week PT Treatment/Interventions: Gait training;Stair training;Functional mobility training;Therapeutic activities;Therapeutic exercise;Balance training;Patient/family education PT Recommendation Follow Up Recommendations: Inpatient Rehab;Skilled nursing facility Equipment Recommended: Defer to next venue PT Goals  Acute Rehab PT Goals PT Goal Formulation: With patient Time For Goal Achievement: 2 weeks Pt will go Supine/Side to Sit: Independently PT Goal: Supine/Side to Sit - Progress: Progressing toward goal Pt will go Sit to Supine/Side:  Independently PT Goal: Sit to Supine/Side - Progress: Progressing toward goal Pt will go Sit to Stand: with supervision PT Goal: Sit to Stand - Progress: Progressing toward goal Pt will go Stand to Sit: with supervision PT Goal: Stand to Sit - Progress: Progressing toward goal Pt will Ambulate: 51 - 150 feet;with least restrictive assistive device PT Goal: Ambulate - Progress: Not met  PT Evaluation Precautions/Restrictions  Precautions Precautions: Fall Prior Functioning  Home Living Lives With: Alone Receives Help From:  (son next door) Type of Home: House Home Layout: One level Home Access: Stairs to enter Entrance Stairs-Rails: Doctor, general practice of Steps: 2 Bathroom Toilet: Handicapped height Home Adaptive Equipment: Walker - rolling Additional Comments: dtr-in-law cooks few nights a week Prior Function Level of Independence: Independent with gait Driving: Yes (daytime only) Comments: walks with cane when goes out; 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; 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 Arousal/Alertness: Awake/alert Overall Cognitive Status: Appears within functional limits for tasks assessed Orientation Level: Oriented X4  Sensation/Coordination   Extremity Assessment RUE Assessment RUE Assessment: Within Functional Limits for activities tested LUE Assessment LUE Assessment: Within Functional Limits  for activities tested RLE Assessment RLE Assessment: Within Functional Limits for activities tested LLE Assessment LLE Assessment: Within Functional Limits  for activities tested  Mobility (including Balance) Bed Mobility Bed Mobility: Yes Supine to Sit: 4: Min assist Supine to Sit Details (indicate cue type and reason): min assist with UB, cues for technique Sit to Supine: 5: Supervision Sit to Supine - Details (indicate cue type and reason): cues to complete Transfers Sit to Stand: 4: Min assist Sit to Stand  Details (indicate cue type and reason): cues for and assist for wt shift Stand to Sit: 4: Min assist;3:  Mod assist Stand to Sit Details: control of descent and cues for hand placement and safety Ambulation/Gait Ambulation/Gait Assistance: 4: Min assist;3: Mod assist Ambulation/Gait Assistance Details (indicate cue type and reason): cues for posture and step length; pt did not want to go outside room d/t frequent stools Ambulation Distance (Feet): 25 Feet (in room) Assistive device: 1 person hand held assist, second person for IV and safety Gait Pattern: Step-through pattern;Decreased step length - right;Decreased step length - left; unsteady;  Posture/Postural Control Posture/Postural Control: No significant limitations Static Standing Balance Static Standing - Balance Support: Right upper extremity supported Static Standing - Level of Assistance: 5: Stand by assistance;4: Min assist Exercise    End of Session PT - End of Session Activity Tolerance: Patient tolerated treatment well Patient left: in bed (NO CHAIR AVAILABLE) Nurse Communication: Mobility status for ambulation General Behavior During Session: [facility] for tasks performed Cognition: [facility] for tasks performed  [facility] 06/27/2011, 11:37 AM

## 2011-06-27 NOTE — Progress Notes (Signed)
Lab result for pt/inr 48.0/5.1.Marland Kitchennotified dr. Cleotis Lema. No orders at this time. Pharmacy will determine dosages.

## 2011-06-27 NOTE — Progress Notes (Signed)
ANTICOAGULATION CONSULT NOTE - Follow Up Consult  Pharmacy Consult for  Coumadin Indication: atrial fibrillation  Allergies  Allergen Reactions  . Morphine And Related Other (See Comments)    Reaction unknown    Patient Measurements: Height: 6\' 1"  (185.4 cm) Weight: 180 lb (81.647 kg) IBW/kg (Calculated) : 79.9    Vital Signs: Temp: 97.7 F (36.5 C) (01/15 0529) Temp src: Oral (01/15 0529) BP: 95/58 mmHg (01/15 0529) Pulse Rate: 100  (01/15 0529)  Labs:  Basename 06/27/11 0951 06/27/11 0348 06/26/11 0437 06/25/11 0810  HGB -- -- 11.3* 12.0*  HCT -- -- 35.1* 36.2*  PLT -- -- 91* 93*  APTT -- -- -- --  LABPROT 48.0* -- 42.3* 30.7*  INR 5.12* -- 4.36* 2.89*  HEPARINUNFRC -- -- -- --  CREATININE -- 0.78 0.79 0.91  CKTOTAL -- -- -- --  CKMB -- -- -- --  TROPONINI -- -- -- --   Estimated Creatinine Clearance: 58.3 ml/min (by C-G formula based on Cr of 0.78).   Medications:  Scheduled:     . sodium chloride   Intravenous STAT  . aspirin EC  81 mg Oral Daily  . cholecalciferol  2,000 Units Oral Q0700  . ezetimibe  10 mg Oral QHS  . feeding supplement  237 mL Oral BID BM  . finasteride  5 mg Oral Daily  . gemfibrozil  600 mg Oral BID AC  . hydrocortisone cream  1 application Topical BID  . insulin aspart  0-5 Units Subcutaneous QHS  . insulin aspart  0-9 Units Subcutaneous TID WC  . levothyroxine  25 mcg Oral QAC breakfast  . mirtazapine  7.5 mg Oral QHS  . omega-3 acid ethyl esters  1 g Oral Daily  . potassium chloride  10 mEq Intravenous Q1 Hr x 4  . saccharomyces boulardii  250 mg Oral BID  . Tamsulosin HCl  0.4 mg Oral Q0700  . vancomycin  125 mg Oral Q6H  . zinc oxide   Topical Daily   Infusions:     . sodium chloride 75 mL/hr at 06/26/11 2304   PRN: LORazepam  Goal INR: 2-3  Assessment:  78 yo M from NH with hx of A.fib admitted with recurrent C.diff  INR supratherapeutic and rising despite holding warfarin   Plan:  1. No warfarin  today. 2. Await further word from MD on whether patient is a candidate to resume warfarin later.  Elie Goody, Pharm.D.  [phone] 06/27/2011 11:38 AM

## 2011-06-27 NOTE — Progress Notes (Signed)
Occupational Therapy Note Order noted. Pt just worked with PT; will check back on pt at a later time. Note increased INR level today. Judithann Sauger OTR/L [phone] 06/27/2011

## 2011-06-27 NOTE — Clinical Documentation Improvement (Signed)
MALNUTRITION DOCUMENTATION CLARIFICATION  THIS DOCUMENT IS NOT A PERMANENT PART OF THE MEDICAL RECORD  TO RESPOND TO THE THIS QUERY, FOLLOW THE INSTRUCTIONS BELOW:  1. If needed, update documentation for the patient's encounter via the notes activity.  2. Access this query again and click edit on the In [employer].  3. After updating, or not, click F2 to complete all highlighted (required) Arnell concerning your review. Select "additional documentation in the medical record" OR "no additional documentation provided".  4. Click Sign note button.  5. The deficiency will fall out of your In Basket *Please let us know if you are not able to complete this workflow by phone or e-mail (listed below).  Please update your documentation within the medical record to reflect your response to this query.                                                                                        06/27/11   Dear Dr. Cleotis Lema / Associates,  In a better effort to capture your patient's severity of illness, reflect appropriate length of stay and utilization of resources, a review of the patient medical record has revealed the following indicators.    Based on your clinical judgment, please clarify and document in a progress note and/or discharge summary the clinical condition associated with the following supporting information:  In responding to this query please exercise your independent judgment.  The fact that a query is asked, does not imply that any particular answer is desired or expected. Please clarify Gary Fleet in progress notes or d/c summary whether or not pt condition can be specified as nutritional consult assessment indicates on 06/25/11 as:   Marland Kitchen Severe Malnutrition   . Severe Protein Calorie Malnutrition   Other Condition (please specify) Cannot Clinically Determine   Clinical Information:  Risk Factors: Diarrhea, Diabetes mellitus, pressure ulcer on buttocks, C. Difficile  Signs &  Symptoms:poor intake for the past 2 months  -Ht: 83ft 1in Wt: 180lbs  -ZOX:WRUE mass index is 23.75   -Weight Loss 30 pound unintentional weight loss in 2 months  -Albumin level:2.8 -Total Protein:6.9 -Calcium level:8.4  Treatments: -Medications:NS@ 39ml/hr Ensure Clinical Strength BID Magic Cup BID Remeron -Nutrition Consult: 06/26/11     Pt with severe PCM of chronic illness AEB 14% weight loss and <75% energy intake in the past month in addition to stage 2 pressure ulcer on buttocks       You may use possible, probable, or suspect with inpatient documentation. possible, probable, suspected diagnoses MUST be documented at the time of discharge  Reviewed: additional documentation in the medical record  Thank You, Andy Gauss RN  Clinical Documentation Specialist:  Pager [phone] E-mail [email or web address]   Health Information Management [place]

## 2011-06-28 LAB — BASIC METABOLIC PANEL
Calcium: 8 mg/dL — ABNORMAL LOW (ref 8.4–10.5)
GFR calc Af Amer: 87 mL/min — ABNORMAL LOW (ref >90)
GFR calc non Af Amer: 75 mL/min — ABNORMAL LOW (ref >90)
Glucose, Bld: 109 mg/dL — ABNORMAL HIGH (ref 70–99)
Potassium: 3.5 mEq/L (ref 3.5–5.1)
Sodium: 138 mEq/L (ref 135–145)

## 2011-06-28 LAB — CBC
Hemoglobin: 10.8 g/dL — ABNORMAL LOW (ref 13.0–17.0)
MCH: 31 pg (ref 26.0–34.0)
MCHC: 32.6 g/dL (ref 30.0–36.0)
Platelets: 116 10*3/uL — ABNORMAL LOW (ref 150–400)
RDW: 14.9 % (ref 11.5–15.5)

## 2011-06-28 LAB — GLUCOSE, CAPILLARY: Glucose-Capillary: 141 mg/dL — ABNORMAL HIGH (ref 70–99)

## 2011-06-28 LAB — PROTIME-INR
INR: 3.24 — ABNORMAL HIGH (ref 0.00–1.49)
Prothrombin Time: 33.6 seconds — ABNORMAL HIGH (ref 11.6–15.2)

## 2011-06-28 MED ORDER — ENSURE PUDDING PO PUDG
1.0000 | Freq: Two times a day (BID) | ORAL | Status: DC
  Administered 2011-06-28 – 2011-06-30 (×4): 1 via ORAL
  Filled 2011-06-28 (×5): qty 1

## 2011-06-28 NOTE — Progress Notes (Signed)
Subjective: Patient seen and examined, diarrhea improved  Objective: Vital signs in last 24 hours: Temp:  [97.5 F (36.4 C)-97.9 F (36.6 C)] 97.5 F (36.4 C) (01/16 1400) Pulse Rate:  [100-112] 112  (01/16 1400) Resp:  [18-22] 18  (01/16 1400) BP: (92-98)/(52-67) 92/67 mmHg (01/16 1400) SpO2:  [95 %-96 %] 96 % (01/16 1400) FiO2 (%):  [2 %] 2 % (01/15 2238) Weight change:  Last BM Date: 06/28/11  Intake/Output from previous day: 01/15 0701 - 01/16 0700 In: 2087 [P.O.:360; I.V.:1527; IV Piggyback:200] Out: 200 [Urine:200]     Physical Exam:  General: Alert, awake, oriented x3, in mild acute distress.  Dry mucous membranes noted  Heart: Regular rate and rhythm, without murmurs, rubs, gallops.  Lungs: Clear to auscultation bilaterally.  Abdomen: Soft, nontender, nondistended, positive bowel sounds.  Extremities: No clubbing cyanosis or edema with positive pedal pulses.  Neuro: Grossly intact, nonfocal.    Lab Results: Results for orders placed during the hospital encounter of 06/25/11 (from the past 24 hour(s))  GLUCOSE, CAPILLARY     Status: Abnormal   Collection Time   06/27/11  5:40 PM      Component Value Range   Glucose-Capillary 163 (*) 70 - 99 (mg/dL)  GLUCOSE, CAPILLARY     Status: Abnormal   Collection Time   06/27/11 10:00 PM      Component Value Range   Glucose-Capillary 141 (*) 70 - 99 (mg/dL)  PROTIME-INR     Status: Abnormal   Collection Time   06/28/11  4:00 AM      Component Value Range   Prothrombin Time 33.6 (*) 11.6 - 15.2 (seconds)   INR 3.24 (*) 0.00 - 1.49   CBC     Status: Abnormal   Collection Time   06/28/11  4:00 AM      Component Value Range   WBC 6.7  4.0 - 10.5 (K/uL)   RBC 3.48 (*) 4.22 - 5.81 (MIL/uL)   Hemoglobin 10.8 (*) 13.0 - 17.0 (g/dL)   HCT 16.1 (*) 09.6 - 52.0 (%)   MCV 95.1  78.0 - 100.0 (fL)   MCH 31.0  26.0 - 34.0 (pg)   MCHC 32.6  30.0 - 36.0 (g/dL)   RDW 04.5  40.9 - 81.1 (%)   Platelets 116 (*) 150 - 400 (K/uL)    BASIC METABOLIC PANEL     Status: Abnormal   Collection Time   06/28/11  4:00 AM      Component Value Range   Sodium 138  135 - 145 (mEq/L)   Potassium 3.5  3.5 - 5.1 (mEq/L)   Chloride 110  96 - 112 (mEq/L)   CO2 22  19 - 32 (mEq/L)   Glucose, Bld 109 (*) 70 - 99 (mg/dL)   BUN 11  6 - 23 (mg/dL)   Creatinine, Ser 9.14  0.50 - 1.35 (mg/dL)   Calcium 8.0 (*) 8.4 - 10.5 (mg/dL)   GFR calc non Af Amer 75 (*) >90 (mL/min)   GFR calc Af Amer 87 (*) >90 (mL/min)  GLUCOSE, CAPILLARY     Status: Abnormal   Collection Time   06/28/11  7:55 AM      Component Value Range   Glucose-Capillary 124 (*) 70 - 99 (mg/dL)  GLUCOSE, CAPILLARY     Status: Abnormal   Collection Time   06/28/11 12:28 PM      Component Value Range   Glucose-Capillary 219 (*) 70 - 99 (mg/dL)    Studies/Results: No  results found.  Medications:    . aspirin EC  81 mg Oral Daily  . cholecalciferol  2,000 Units Oral Q0700  . ezetimibe  10 mg Oral QHS  . feeding supplement  237 mL Oral BID BM  . feeding supplement  1 Container Oral BID WC  . finasteride  5 mg Oral Daily  . gemfibrozil  600 mg Oral BID AC  . hydrocortisone cream  1 application Topical BID  . insulin aspart  0-5 Units Subcutaneous QHS  . insulin aspart  0-9 Units Subcutaneous TID WC  . levothyroxine  25 mcg Oral QAC breakfast  . mirtazapine  7.5 mg Oral QHS  . omega-3 acid ethyl esters  1 g Oral Daily  . potassium chloride  10 mEq Intravenous Q1 Hr x 4  . saccharomyces boulardii  250 mg Oral BID  . Tamsulosin HCl  0.4 mg Oral Q0700  . vancomycin  125 mg Oral Q6H  . zinc oxide   Topical Daily    LORazepam     . sodium chloride 75 mL/hr at 06/28/11 0951    Assessment/Plan: 1.Reccurent C diff colitis :  Decrease IVF and continue vancomycin d#4 will need a 14d course of Vancomycin given first recurrence  2. Weakness generalized  Continue PT/OT  3.DM: holding oral hypoglycemics ,continue insulin scale   4. Chronic Afib:   supratherapeutic INR noted . Coumadin on hold.Risks and benefits of anticoagulation discussed with daughter and son .   5. Severe PCM: continue supplements as per dietician recommendations.  Disposition: SNF at DC    LOS: 3 days   Irena Gaydos 06/28/2011, 2:53 PM

## 2011-06-28 NOTE — Progress Notes (Signed)
Occupational Therapy Evaluation Patient Details Name: David Davidson MRN: [id number] DOB: Aug 09, 1910 Today's Date: 06/28/2011 Cornell Barman [phone] Problem List:  Patient Active Problem List  Diagnoses  . CAD (coronary artery disease)  . Paroxysmal a-fib  . Diabetes mellitus  . Hypertension  . S/P CABG (coronary artery bypass graft)  . Hyperlipidemia  . Hypothyroidism  . Diarrhea  . Weakness generalized  . Dehydration    Past Medical History:  Past Medical History  Diagnosis Date  . Myocardial infarct   . Diabetes mellitus   . Hypertension   . CHF (congestive heart failure)   . Coronary artery disease   . Dysrhythmia    Past Surgical History:  Past Surgical History  Procedure Date  . Coronary artery bypass graft   . Cholecystectomy     OT Assessment/Plan/Recommendation OT Assessment Clinical Impression Statement: 78 y/o male, living Independently. Presents w/decreased standing activity tolerance, BADL performance. Skilled OT recommended to maximize I to min A/supervision level in prep for d/c to next venue of care. OT Recommendation/Assessment: Patient will need skilled OT in the acute care venue OT Problem List: Decreased activity tolerance;Decreased knowledge of use of DME or AE;Decreased safety awareness Barriers to Discharge: Decreased caregiver support;Inaccessible home environment OT Therapy Diagnosis : Generalized weakness OT Plan OT Frequency: Min 1X/week OT Treatment/Interventions: Self-care/ADL training;Therapeutic activities;DME and/or AE instruction;Patient/family education OT Recommendation Follow Up Recommendations: Skilled nursing facility Equipment Recommended: Defer to next venue Individuals Consulted Consulted and Agree with Results and Recommendations: Patient OT Goals Acute Rehab OT Goals OT Goal Formulation: With patient Time For Goal Achievement: 2 weeks ADL Goals Pt Will Perform Grooming: with supervision;Other (comment);Standing at sink (X  3 tasks to improve standing activity tolerance.) ADL Goal: Grooming - Progress: Goal set today Pt Will Transfer to Toilet: with supervision;Ambulation;3-in-1 ADL Goal: Toilet Transfer - Progress: Goal set today Pt Will Perform Toileting - Clothing Manipulation: with min assist;Standing ADL Goal: Toileting - Clothing Manipulation - Progress: Goal set today Pt Will Perform Toileting - Hygiene: with min assist;Sit to stand from 3-in-1/toilet ADL Goal: Toileting - Hygiene - Progress: Goal set today  OT Evaluation Precautions/Restrictions  Precautions Precautions: Fall Restrictions Weight Bearing Restrictions: No Prior Functioning Home Living Lives With: Alone Receives Help From: Other (Comment) (son lives next door.) Type of Home: House Home Layout: One level Home Access: Stairs to enter Entrance Stairs-Rails: Doctor, general practice of Steps: 2 Bathroom Toilet: Handicapped height Home Adaptive Equipment: Walker - rolling;Straight cane Prior Function Level of Independence: Independent with basic ADLs;Needs assistance with gait;Independent with transfers;Independent with homemaking with ambulation Driving: Yes Comments: d/c plan is for ST-SNF ADL ADL Grooming: Simulated;Set up Where Assessed - Grooming: Sitting, bed Upper Body Bathing: Simulated;Set up Where Assessed - Upper Body Bathing: Sitting, bed;Unsupported Lower Body Bathing: Maximal assistance;Simulated Where Assessed - Lower Body Bathing: Sit to stand from bed Upper Body Dressing: Simulated;Set up Where Assessed - Upper Body Dressing: Sitting, bed;Unsupported Lower Body Dressing: Simulated;Maximal assistance Where Assessed - Lower Body Dressing: Sit to stand from bed Toilet Transfer: Performed;Minimal assistance Toilet Transfer Method: Stand pivot Toilet Transfer Equipment: Bedside commode Toileting - Clothing Manipulation: Performed;Maximal assistance Where Assessed - Toileting Clothing Manipulation: Sit  to stand from 3-in-1 or toilet Toileting - Hygiene: Performed;Maximal assistance Where Assessed - Toileting Hygiene: Sit to stand from 3-in-1 or toilet Tub/Shower Transfer: Not assessed Tub/Shower Transfer Method: Not assessed Equipment Used: Rolling walker;Other (comment) (3:1) ADL Comments: Pt fatigues quickly, Increased time and effort needed for functional tasks. Vision/Perception  Vision -  History Baseline Vision: Wears glasses all the time Visual History: Glaucoma Patient Visual Report: No change from baseline Vision - Assessment Vision Assessment: Vision not tested Cognition Cognition Arousal/Alertness: Awake/alert Overall Cognitive Status: Appears within functional limits for tasks assessed Orientation Level: Oriented X4 Sensation/Coordination   Extremity Assessment RUE Assessment RUE Assessment: Within Functional Limits LUE Assessment LUE Assessment: Within Functional Limits Mobility  Bed Mobility Bed Mobility: Yes Supine to Sit: 4: Min assist;HOB flat;With rails Transfers Transfers: Yes Sit to Stand: 4: Min assist;From bed;From chair/3-in-1;With upper extremity assist Stand to Sit: 4: Min assist;To chair/3-in-1;With upper extremity assist;With armrests Stand to Sit Details: cues for hand placement. Exercises   End of Session OT - End of Session Equipment Utilized During Treatment: Other (comment) (RW, 3:1) Activity Tolerance: Patient tolerated treatment well Patient left: in chair;with call bell in reach General Behavior During Session: [facility] for tasks performed Cognition: [facility] for tasks performed   Albana Saperstein A, OTR/L [phone] 06/28/2011, 10:23 AM

## 2011-06-28 NOTE — Progress Notes (Signed)
ANTICOAGULATION CONSULT NOTE - Follow Up Consult  Pharmacy Consult for  Coumadin Indication: atrial fibrillation  Allergies  Allergen Reactions  . Morphine And Related Other (See Comments)    Reaction unknown    Patient Measurements: Height: 6\' 1"  (185.4 cm) Weight: 180 lb (81.647 kg) IBW/kg (Calculated) : 79.9    Vital Signs: Temp: 97.7 F (36.5 C) (01/16 0414) Temp src: Oral (01/16 0414) BP: 98/52 mmHg (01/16 0414) Pulse Rate: 100  (01/16 0414)  Labs:  Basename 06/28/11 0400 06/27/11 0951 06/27/11 0348 06/26/11 0437  HGB 10.8* -- -- 11.3*  HCT 33.1* -- -- 35.1*  PLT 116* -- -- 91*  APTT -- -- -- --  LABPROT 33.6* 48.0* -- 42.3*  INR 3.24* 5.12* -- 4.36*  HEPARINUNFRC -- -- -- --  CREATININE 0.72 -- 0.78 0.79  CKTOTAL -- -- -- --  CKMB -- -- -- --  TROPONINI -- -- -- --   Estimated Creatinine Clearance: 58.3 ml/min (by C-G formula based on Cr of 0.72).   Medications:  Scheduled:     . aspirin EC  81 mg Oral Daily  . cholecalciferol  2,000 Units Oral Q0700  . ezetimibe  10 mg Oral QHS  . feeding supplement  237 mL Oral BID BM  . finasteride  5 mg Oral Daily  . gemfibrozil  600 mg Oral BID AC  . hydrocortisone cream  1 application Topical BID  . insulin aspart  0-5 Units Subcutaneous QHS  . insulin aspart  0-9 Units Subcutaneous TID WC  . levothyroxine  25 mcg Oral QAC breakfast  . mirtazapine  7.5 mg Oral QHS  . omega-3 acid ethyl esters  1 g Oral Daily  . potassium chloride  10 mEq Intravenous Q1 Hr x 4  . saccharomyces boulardii  250 mg Oral BID  . Tamsulosin HCl  0.4 mg Oral Q0700  . vancomycin  125 mg Oral Q6H  . zinc oxide   Topical Daily   Infusions:     . sodium chloride 75 mL/hr at 06/27/11 1728   PRN: LORazepam  Goal INR: 2-3  Assessment:  78 yo M from [facility] with hx of A.fib admitted with recurrent C.diff.  Coumadin dose PTA = 3 mg po daily.  INR decreasing but still supratherapeutic.  Coumadin on hold since 1/14.  No  bleeding/complications noted.  Plan:   No warfarin today.  MD discussed risk vs benefit of anticoagulation with family - may not be a suitable candidate for Coumadin.  Awaiting decision .    Geoffry Paradise, PharmD.  Pager:  [phone] 8:59 AM

## 2011-06-29 LAB — BASIC METABOLIC PANEL
BUN: 11 mg/dL (ref 6–23)
CO2: 21 mEq/L (ref 19–32)
Chloride: 110 mEq/L (ref 96–112)
Creatinine, Ser: 0.65 mg/dL (ref 0.50–1.35)
Glucose, Bld: 146 mg/dL — ABNORMAL HIGH (ref 70–99)
Potassium: 3.3 mEq/L — ABNORMAL LOW (ref 3.5–5.1)

## 2011-06-29 LAB — GLUCOSE, CAPILLARY
Glucose-Capillary: 213 mg/dL — ABNORMAL HIGH (ref 70–99)
Glucose-Capillary: 251 mg/dL — ABNORMAL HIGH (ref 70–99)
Glucose-Capillary: 202 mg/dL — ABNORMAL HIGH (ref 70–99)

## 2011-06-29 LAB — PROTIME-INR: Prothrombin Time: 26.9 seconds — ABNORMAL HIGH (ref 11.6–15.2)

## 2011-06-29 MED ORDER — WARFARIN SODIUM 1 MG PO TABS
1.0000 mg | ORAL_TABLET | Freq: Once | ORAL | Status: AC
  Administered 2011-06-29: 1 mg via ORAL
  Filled 2011-06-29: qty 1

## 2011-06-29 NOTE — Progress Notes (Signed)
Family requesting [employer]. [facility] accepts patient. CSW faxed information into Darl Pikes at [employer] to obtain [place].  Farah Benish C. Shaheed Schmuck MSW, LCSW [phone]

## 2011-06-29 NOTE — Progress Notes (Signed)
Subjective: Patient seen and examined, diarrhea improved  Objective: Vital signs in last 24 hours: Temp:  [96.8 F (36 C)-96.9 F (36.1 C)] 96.9 F (36.1 C) (01/17 1610) Pulse Rate:  [107-109] 109  (01/17 0633) Resp:  [18] 18  (01/17 0633) BP: (95-118)/(50-60) 118/50 mmHg (01/17 0633) SpO2:  [94 %-96 %] 94 % (01/17 9604) Weight change:  Last BM Date: 06/28/11  Intake/Output from previous day: 01/16 0701 - 01/17 0700 In: -  Out: 700 [Urine:700]     Physical Exam:  General: Alert, awake, oriented x3, in mild acute distress.  Dry mucous membranes noted  Heart: Regular rate and rhythm, without murmurs, rubs, gallops.  Lungs: Clear to auscultation bilaterally.  Abdomen: Soft, nontender, nondistended, positive bowel sounds.  Extremities: No clubbing cyanosis or edema with positive pedal pulses.  Neuro: Grossly intact, nonfocal.    Lab Results: Results for orders placed during the hospital encounter of 06/25/11 (from the past 24 hour(s))  GLUCOSE, CAPILLARY     Status: Abnormal   Collection Time   06/28/11  5:41 PM      Component Value Range   Glucose-Capillary 174 (*) 70 - 99 (mg/dL)  GLUCOSE, CAPILLARY     Status: Abnormal   Collection Time   06/28/11  9:45 PM      Component Value Range   Glucose-Capillary 150 (*) 70 - 99 (mg/dL)   Comment 1 Notify RN    PROTIME-INR     Status: Abnormal   Collection Time   06/29/11  3:51 AM      Component Value Range   Prothrombin Time 26.9 (*) 11.6 - 15.2 (seconds)   INR 2.44 (*) 0.00 - 1.49   BASIC METABOLIC PANEL     Status: Abnormal   Collection Time   06/29/11  3:51 AM      Component Value Range   Sodium 139  135 - 145 (mEq/L)   Potassium 3.3 (*) 3.5 - 5.1 (mEq/L)   Chloride 110  96 - 112 (mEq/L)   CO2 21  19 - 32 (mEq/L)   Glucose, Bld 146 (*) 70 - 99 (mg/dL)   BUN 11  6 - 23 (mg/dL)   Creatinine, Ser 5.40  0.50 - 1.35 (mg/dL)   Calcium 8.0 (*) 8.4 - 10.5 (mg/dL)   GFR calc non Af Amer 78 (*) >90 (mL/min)   GFR calc Af  Amer >90  >90 (mL/min)  GLUCOSE, CAPILLARY     Status: Abnormal   Collection Time   06/29/11 10:13 AM      Component Value Range   Glucose-Capillary 213 (*) 70 - 99 (mg/dL)  GLUCOSE, CAPILLARY     Status: Abnormal   Collection Time   06/29/11 11:49 AM      Component Value Range   Glucose-Capillary 251 (*) 70 - 99 (mg/dL)    Studies/Results: No results found.  Medications:    . aspirin EC  81 mg Oral Daily  . cholecalciferol  2,000 Units Oral Q0700  . ezetimibe  10 mg Oral QHS  . feeding supplement  237 mL Oral BID BM  . feeding supplement  1 Container Oral BID WC  . finasteride  5 mg Oral Daily  . gemfibrozil  600 mg Oral BID AC  . hydrocortisone cream  1 application Topical BID  . insulin aspart  0-5 Units Subcutaneous QHS  . insulin aspart  0-9 Units Subcutaneous TID WC  . levothyroxine  25 mcg Oral QAC breakfast  . mirtazapine  7.5 mg Oral  QHS  . omega-3 acid ethyl esters  1 g Oral Daily  . saccharomyces boulardii  250 mg Oral BID  . Tamsulosin HCl  0.4 mg Oral Q0700  . vancomycin  125 mg Oral Q6H  . warfarin  1 mg Oral ONCE-1800  . zinc oxide   Topical Daily    LORazepam     . sodium chloride 20 mL/hr at 06/28/11 1511    Assessment/Plan: 1.Reccurent C diff colitis :  Decrease IVF and continue vancomycin d#5 will need a 14d course of Vancomycin given first recurrence  2. Weakness generalized  Continue PT/OT  3.DM: holding oral hypoglycemics ,continue insulin scale   4. Chronic Afib:   Resume Coumadin, will defer need to if reevaluate anticoagulation with Coumadin to his PCP .   5. Severe PCM: continue supplements as per dietician recommendations.  Disposition: SNF 1/18    LOS: 4 days   Jeani Fassnacht 06/29/2011, 2:51 PM

## 2011-06-29 NOTE — Progress Notes (Signed)
ANTICOAGULATION CONSULT NOTE - Follow Up Consult  Pharmacy Consult for Warfarin Indication: atrial fibrillation  Allergies  Allergen Reactions  . Morphine And Related Other (See Comments)    Reaction unknown    Patient Measurements: Height: 6\' 1"  (185.4 cm) Weight: 180 lb (81.647 kg) IBW/kg (Calculated) : 79.9   Vital Signs: Temp: 96.9 F (36.1 C) (01/17 0633) Temp src: Oral (01/17 0633) BP: 118/50 mmHg (01/17 0633) Pulse Rate: 109  (01/17 0633)  Labs:  Basename 06/29/11 0351 06/28/11 0400 06/27/11 0951 06/27/11 0348  HGB -- 10.8* -- --  HCT -- 33.1* -- --  PLT -- 116* -- --  APTT -- -- -- --  LABPROT 26.9* 33.6* 48.0* --  INR 2.44* 3.24* 5.12* --  HEPARINUNFRC -- -- -- --  CREATININE 0.65 0.72 -- 0.78  CKTOTAL -- -- -- --  CKMB -- -- -- --  TROPONINI -- -- -- --   Estimated Creatinine Clearance: 58.3 ml/min (by C-G formula based on Cr of 0.65).   Medications:  Scheduled:    . aspirin EC  81 mg Oral Daily  . cholecalciferol  2,000 Units Oral Q0700  . ezetimibe  10 mg Oral QHS  . feeding supplement  237 mL Oral BID BM  . feeding supplement  1 Container Oral BID WC  . finasteride  5 mg Oral Daily  . gemfibrozil  600 mg Oral BID AC  . hydrocortisone cream  1 application Topical BID  . insulin aspart  0-5 Units Subcutaneous QHS  . insulin aspart  0-9 Units Subcutaneous TID WC  . levothyroxine  25 mcg Oral QAC breakfast  . mirtazapine  7.5 mg Oral QHS  . omega-3 acid ethyl esters  1 g Oral Daily  . saccharomyces boulardii  250 mg Oral BID  . Tamsulosin HCl  0.4 mg Oral Q0700  . vancomycin  125 mg Oral Q6H  . zinc oxide   Topical Daily   Infusions:    . sodium chloride 20 mL/hr at 06/28/11 1511   PRN: LORazepam  Assessment: 78 yo M on chronic warfarin for hx of Afib. Prior to admission, usual warfarin dose was 3mg  PO QHS. Warfarin has been on hold due to supratherapeutic INR and decision about long-term antiocoag. Discussed this with Dr. Jomarie Longs just now,  who wants to continue warfarin use. INR now back in goal range 2-3. Will resume warfarin dosing tonight at lower dose.  Goal of Therapy:  INR 2-3   Plan:  1)  Warfarin 1mg  PO x1 at 18:00 2)  F/U daily INR trend.  Darrol Angel, PharmD Pager: [phone] 06/29/2011,10:47 AM

## 2011-06-30 DIAGNOSIS — A0471 Enterocolitis due to Clostridium difficile, recurrent: Secondary | ICD-10-CM | POA: Diagnosis present

## 2011-06-30 LAB — PROTIME-INR
INR: 1.88 — ABNORMAL HIGH (ref 0.00–1.49)
Prothrombin Time: 21.9 seconds — ABNORMAL HIGH (ref 11.6–15.2)

## 2011-06-30 MED ORDER — VANCOMYCIN 50 MG/ML ORAL SOLUTION: 125.0000 mg | Freq: Four times a day (QID) | ORAL | Status: AC

## 2011-06-30 MED ORDER — WARFARIN SODIUM 1 MG PO TABS
1.0000 mg | ORAL_TABLET | Freq: Once | ORAL | Status: DC
  Filled 2011-06-30: qty 1

## 2011-06-30 MED ORDER — ENSURE CLINICAL ST REVIGOR PO LIQD: 237.0000 mL | Freq: Two times a day (BID) | ORAL | Status: DC

## 2011-06-30 NOTE — Progress Notes (Signed)
ANTICOAGULATION CONSULT NOTE - Follow Up Consult  Pharmacy Consult for Warfarin Indication: atrial fibrillation  Allergies  Allergen Reactions  . Morphine And Related Other (See Comments)    Reaction unknown    Patient Measurements: Height: 6\' 1"  (185.4 cm) Weight: 180 lb (81.647 kg) IBW/kg (Calculated) : 79.9   Vital Signs: Temp: 96.1 F (35.6 C) (01/18 0546) Temp src: Oral (01/18 0546) BP: 117/70 mmHg (01/18 0546) Pulse Rate: 111  (01/18 0546)  Labs:  Basename 06/30/11 0335 06/29/11 0351 06/28/11 0400  HGB -- -- 10.8*  HCT -- -- 33.1*  PLT -- -- 116*  APTT -- -- --  LABPROT 21.9* 26.9* 33.6*  INR 1.88* 2.44* 3.24*  HEPARINUNFRC -- -- --  CREATININE -- 0.65 0.72  CKTOTAL -- -- --  CKMB -- -- --  TROPONINI -- -- --   Estimated Creatinine Clearance: 58.3 ml/min (by C-G formula based on Cr of 0.65).   Medications:  Scheduled:     . aspirin EC  81 mg Oral Daily  . cholecalciferol  2,000 Units Oral Q0700  . ezetimibe  10 mg Oral QHS  . feeding supplement  237 mL Oral BID BM  . feeding supplement  1 Container Oral BID WC  . finasteride  5 mg Oral Daily  . gemfibrozil  600 mg Oral BID AC  . hydrocortisone cream  1 application Topical BID  . insulin aspart  0-5 Units Subcutaneous QHS  . insulin aspart  0-9 Units Subcutaneous TID WC  . levothyroxine  25 mcg Oral QAC breakfast  . mirtazapine  7.5 mg Oral QHS  . omega-3 acid ethyl esters  1 g Oral Daily  . saccharomyces boulardii  250 mg Oral BID  . Tamsulosin HCl  0.4 mg Oral Q0700  . vancomycin  125 mg Oral Q6H  . warfarin  1 mg Oral ONCE-1800  . zinc oxide   Topical Daily   Infusions:     . sodium chloride 20 mL (06/29/11 2221)   PRN: LORazepam  Assessment:  78 y/o M on chronic warfarin for hx of Afib.  Patient's usual warfarin dosage prior to admission was reportedly 3mg  PO qhs, but INR was supratherapeutic earlier, likely due to diarrhea and limited dietary intake.  INR now falling.  Inpatient  warfarin doses for past 5 days: 2, 0, 0, 0, 1mg   Goal of Therapy:  INR 2-3   Plan:  1)  Warfarin 1mg  PO x1 today. 2)  F/U INR daily, adjust warfarin dosage.  Elie Goody, Pharm.D.  [phone] 06/30/2011 8:36 AM

## 2011-06-30 NOTE — Discharge Summary (Signed)
Physician Discharge Summary  Patient ID: David Davidson MRN: [id number] DOB/AGE: 76-Nov-1912 31 y.o.  Admit date: 06/25/2011 Discharge date: 06/30/2011  Primary Care Physician:  [name], MD, MD  Discharge Diagnoses:   1. Recurrent C. difficile colitis 2. Dehydration 3. diabetes mellitus type 2 4. paroxysmal atrial fibrillation on Coumadin 5. history of diastolic CHF 6. Hypothyroidism 7. Hypertension 8. history of CAD 9. protein calorie malnutrition 10. BPH  Current Discharge Medication List    START taking these medications   Details  feeding supplement (ENSURE CLINICAL STRENGTH) LIQD Take 237 mLs by mouth 2 (two) times daily between meals.      CONTINUE these medications which have CHANGED   Details  vancomycin (VANCOCIN) 50 mg/mL oral solution Take 2.5 mLs (125 mg total) by mouth every 6 (six) hours. He took for 10 days. He started on 06/12/11 and completed on 06/21/11.      CONTINUE these medications which have NOT CHANGED   Details  acetaminophen (TYLENOL) 500 MG tablet Take 1,000 mg by mouth at bedtime as needed. For pain     aspirin 81 MG tablet Take 81 mg by mouth every morning.     cholecalciferol (VITAMIN D) 1000 UNITS tablet Take 2,000 Units by mouth every morning.     ezetimibe (ZETIA) 10 MG tablet Take 10 mg by mouth at bedtime.     finasteride (PROSCAR) 5 MG tablet Take 5 mg by mouth every morning.     fish oil-omega-3 fatty acids 1000 MG capsule Take 1 g by mouth every morning.     furosemide (LASIX) 20 MG tablet Take 20 mg by mouth every other day. Hold for SBP < 100.    gemfibrozil (LOPID) 600 MG tablet Take 600 mg by mouth 2 (two) times daily before a meal.      glyBURIDE (DIABETA) 5 MG tablet Take 5 mg by mouth.    hydrocortisone cream 1 % Apply 1 application topically 2 (two) times daily. itchy back rash     levothyroxine (SYNTHROID, LEVOTHROID) 25 MCG tablet Take 25 mcg by mouth daily.     LORazepam (ATIVAN) 0.5 MG tablet Take 0.5 mg by  mouth at bedtime as needed. For insomnia.    Menthol-Zinc Oxide (CALMOSEPTINE EX) Apply 1 application topically daily. Apply to buttocks every shift     mirtazapine (REMERON) 7.5 MG tablet Take 7.5 mg by mouth at bedtime.      saccharomyces boulardii (FLORASTOR) 250 MG capsule Take 250 mg by mouth 2 (two) times daily. Take for 14 days    Tamsulosin HCl (FLOMAX) 0.4 MG CAPS Take 0.4 mg by mouth every morning. Hold for SBP < 100.    warfarin (JANTOVEN) 3 MG tablet Take 3 mg by mouth every evening.         Disposition and Follow-up:  PCP in 1 week   Significant Diagnostic Studies:  Dg Chest 2 View 06/25/2011   IMPRESSION: No acute cardiopulmonary disease.  Original Report Authenticated By: Consuello Bossier, M.D.   Ct Head Wo Contrast 06/25/2011  IMPRESSION:  1. No acute intracranial abnormality. 2.  Expected for age cerebral atrophy and small vessel ischemic change. 3.  Improved sinus disease.  Original Report Authenticated By: Consuello Bossier, M.D.    Brief H and P: [name] is a 78 year old male was recently discharged in the hospital on December 17 after an episode of C. difficile colitis during which he completed his antibiotic course presented to the hospital with worsening diarrhea for 2-3 days  associated with nausea and weakness.  Hospital Course:  1. Recurrent C. difficile colitis: This is his first recurrence, he was started on by mouth vancomycin, had very good clinical response again, also continued on floraster, our plan is to treat him with a total two-week course of by mouth vancomycin. Dehydration and weakness on admission had resolved with IV fluid rehydration and treatment of C. difficile colitis. 2. Paroxysmal A. fib on Coumadin: Rate has been controlled, the decision to continue Coumadin or switch to aspirin given advanced age will be deferred to his primary care doctor. Of his chronic medical problems remain unchanged  Time spent on  Discharge:  Signed: Wynonna Fitzhenry [facility]  06/30/2011, 1:10 PM

## 2011-07-11 ENCOUNTER — Encounter (HOSPITAL_COMMUNITY): Payer: Self-pay | Admitting: Emergency Medicine

## 2011-07-11 ENCOUNTER — Emergency Department (HOSPITAL_COMMUNITY)
Admission: EM | Admit: 2011-07-11 | Discharge: 2011-07-12 | Disposition: A | Discharge status: Skilled Nursing Facility | Payer: Medicare Other | Attending: Emergency Medicine | Admitting: Emergency Medicine

## 2011-07-11 DIAGNOSIS — Z7901 Long term (current) use of anticoagulants: Secondary | ICD-10-CM | POA: Insufficient documentation

## 2011-07-11 DIAGNOSIS — E119 Type 2 diabetes mellitus without complications: Secondary | ICD-10-CM | POA: Insufficient documentation

## 2011-07-11 DIAGNOSIS — I10 Essential (primary) hypertension: Secondary | ICD-10-CM | POA: Insufficient documentation

## 2011-07-11 DIAGNOSIS — I2510 Atherosclerotic heart disease of native coronary artery without angina pectoris: Secondary | ICD-10-CM | POA: Insufficient documentation

## 2011-07-11 DIAGNOSIS — I252 Old myocardial infarction: Secondary | ICD-10-CM | POA: Insufficient documentation

## 2011-07-11 DIAGNOSIS — N390 Urinary tract infection, site not specified: Secondary | ICD-10-CM | POA: Insufficient documentation

## 2011-07-11 DIAGNOSIS — R55 Syncope and collapse: Secondary | ICD-10-CM | POA: Insufficient documentation

## 2011-07-11 DIAGNOSIS — R5381 Other malaise: Secondary | ICD-10-CM | POA: Insufficient documentation

## 2011-07-11 LAB — COMPREHENSIVE METABOLIC PANEL
ALT: 10 U/L (ref 0–53)
AST: 20 U/L (ref 0–37)
Alkaline Phosphatase: 64 U/L (ref 39–117)
GFR calc Af Amer: 67 mL/min — ABNORMAL LOW (ref >90)
Glucose, Bld: 130 mg/dL — ABNORMAL HIGH (ref 70–99)
Potassium: 3.9 mEq/L (ref 3.5–5.1)
Sodium: 136 mEq/L (ref 135–145)
Total Protein: 7.1 g/dL (ref 6.0–8.3)

## 2011-07-11 LAB — DIFFERENTIAL
Basophils Absolute: 0 10*3/uL (ref 0.0–0.1)
Eosinophils Absolute: 0.1 10*3/uL (ref 0.0–0.7)
Lymphocytes Relative: 42 % (ref 12–46)
Lymphs Abs: 2.3 10*3/uL (ref 0.7–4.0)
Neutrophils Relative %: 38 % — ABNORMAL LOW (ref 43–77)

## 2011-07-11 LAB — CBC
Platelets: 112 10*3/uL — ABNORMAL LOW (ref 150–400)
RBC: 3.77 MIL/uL — ABNORMAL LOW (ref 4.22–5.81)
WBC: 5.4 10*3/uL (ref 4.0–10.5)

## 2011-07-11 LAB — URINE MICROSCOPIC-ADD ON

## 2011-07-11 LAB — URINALYSIS, ROUTINE W REFLEX MICROSCOPIC
Bilirubin Urine: NEGATIVE
Glucose, UA: NEGATIVE mg/dL
Ketones, ur: NEGATIVE mg/dL
Nitrite: NEGATIVE
pH: 6 (ref 5.0–8.0)

## 2011-07-11 LAB — CARDIAC PANEL(CRET KIN+CKTOT+MB+TROPI)
CK, MB: 2 ng/mL (ref 0.3–4.0)
Relative Index: INVALID (ref 0.0–2.5)
Troponin I: <0.3 ng/mL (ref <0.30)

## 2011-07-11 NOTE — ED Notes (Signed)
Family at bedside. 

## 2011-07-11 NOTE — ED Notes (Signed)
ZOX:[id number]<AV> Expected date:07/11/11<BR> Expected time: 6:05 PM<BR> Means of arrival:Ambulance<BR> Comments:<BR> EMS 30 GC, 89 yom weakness with exertion during PT

## 2011-07-11 NOTE — ED Notes (Signed)
78 year old from [facility] was having a rehab session today when he began to black out.  He reports he did not have any pain just almost passed out.  Pt. Reports he feels back to normal now.  He reports it happened yesterday also.

## 2011-07-12 MED ORDER — CIPROFLOXACIN HCL 500 MG PO TABS: 500.0000 mg | ORAL_TABLET | Freq: Two times a day (BID) | ORAL | Status: AC

## 2011-07-12 NOTE — ED Provider Notes (Signed)
Medical screening examination/treatment/procedure(s) were conducted as a shared visit with non-physician practitioner(s) and myself.  I personally evaluated the patient during the encounter.  Complains of near-syncope. Normal physical exam. No clinical evidence of a stroke. Patient wants to be discharged  Donnetta Hutching, MD 07/12/11 [phone]

## 2011-07-12 NOTE — ED Provider Notes (Signed)
History     CSN: [id number]  Arrival date & time 07/11/11  1818   First MD Initiated Contact with Patient 07/11/11 2006      Chief Complaint  Patient presents with  . Near Syncope    (Consider location/radiation/quality/duration/timing/severity/associated sxs/prior treatment) HPI Comments: Patient here after having a near syncopal episode at the nursing home.  The patient reports that he was intially admitted to [facility] several weeks ago after admission to the hospital following a bout of pneumonia and subsequent c.difficile colitis.  He states that he agreed to go because of the rehab.  He states that his appetite still has not returned to normal and he is not drinking enough fluids, they have him on dietary supplement shakes to help with this.  He reports starting yesterday while he was exercising during rehab he began to feel like he was going to pass out.  He states that he stopped the exercising and just sat down for a while and he returned to his normal state of health.  He states that today after finishing his rehab, he was on his way back to his room when he again felt like he would pass out and asked to sit down, the staff became concerned and sent him here for further evaluation.  He reports that he did not pass out, denies headache, chest pain, dizziness, nausea, vomiting, continued diarrhea, abdominal pain, shortness of breath, fever or chills.  States that he feels completely back to normal.  Patient is a 78 y.o. male presenting with weakness. The history is provided by the patient. No language interpreter was used.  Weakness Primary symptoms do not include headaches, syncope, loss of consciousness, altered mental status, seizures, dizziness, visual change, paresthesias, focal weakness, loss of sensation, speech change, memory loss, fever, nausea or vomiting. The symptoms began 2 to 6 hours ago. The episode lasted 10 minutes. The symptoms are resolved. The neurological symptoms  are diffuse. The symptoms occurred on exertion.  Additional symptoms include weakness and aura. Additional symptoms do not include pain, lower back pain, leg pain, loss of balance, photophobia, nystagmus, taste disturbance, hearing loss, vertigo, anxiety or dysphoric mood. Medical issues also include hypertension.    Past Medical History  Diagnosis Date  . Myocardial infarct   . Diabetes mellitus   . Hypertension   . CHF (congestive heart failure)   . Coronary artery disease   . Dysrhythmia     Past Surgical History  Procedure Date  . Coronary artery bypass graft   . Cholecystectomy     History reviewed. No pertinent family history.  History  Substance Use Topics  . Smoking status: Former Smoker -- 1.0 packs/day    Types: Cigarettes    Quit date: 05/08/1974  . Smokeless tobacco: Never Used  . Alcohol Use: No     occasionally      Review of Systems  Constitutional: Negative for fever.  HENT: Negative for hearing loss.   Eyes: Negative for photophobia.  Cardiovascular: Negative for syncope.  Gastrointestinal: Negative for nausea and vomiting.  Neurological: Positive for weakness. Negative for dizziness, vertigo, speech change, focal weakness, seizures, loss of consciousness, headaches, paresthesias and loss of balance.  Psychiatric/Behavioral: Negative for memory loss, dysphoric mood and altered mental status.  All other systems reviewed and are negative.    Allergies  Morphine and related  Home Medications   Current Outpatient Rx  Name Route Sig Dispense Refill  . ACETAMINOPHEN 500 MG PO TABS Oral Take 1,000 mg  by mouth at bedtime as needed. For pain     . ASPIRIN 81 MG PO TABS Oral Take 81 mg by mouth every morning.     Marland Kitchen VITAMIN D 1000 UNITS PO TABS Oral Take 2,000 Units by mouth every morning.     Marland Kitchen EZETIMIBE 10 MG PO TABS Oral Take 10 mg by mouth at bedtime.     . ENSURE CLINICAL ST REVIGOR PO LIQD Oral Take 237 mLs by mouth 2 (two) times daily between  meals.    Marland Kitchen FINASTERIDE 5 MG PO TABS Oral Take 5 mg by mouth every morning.     Marland Kitchen OMEGA-3 FATTY ACIDS 1000 MG PO CAPS Oral Take 1 g by mouth every morning.     . FUROSEMIDE 20 MG PO TABS Oral Take 20 mg by mouth every other day. Hold for SBP < 100.    Marland Kitchen GEMFIBROZIL 600 MG PO TABS Oral Take 600 mg by mouth 2 (two) times daily before a meal.      . GLYBURIDE 5 MG PO TABS Oral Take 5 mg by mouth.    Marland Kitchen HYDROCORTISONE 1 % EX CREA Topical Apply 1 application topically 2 (two) times daily. itchy back rash     . LEVOTHYROXINE SODIUM 25 MCG PO TABS Oral Take 25 mcg by mouth daily.     Marland Kitchen LORAZEPAM 0.5 MG PO TABS Oral Take 0.5 mg by mouth at bedtime as needed. For insomnia.    Marland Kitchen CALMOSEPTINE EX Apply externally Apply 1 application topically daily. Apply to buttocks every shift     . MIRTAZAPINE 7.5 MG PO TABS Oral Take 7.5 mg by mouth at bedtime.      Marland Kitchen SACCHAROMYCES BOULARDII 250 MG PO CAPS Oral Take 250 mg by mouth 2 (two) times daily. Take for 14 days    . TAMSULOSIN HCL 0.4 MG PO CAPS Oral Take 0.4 mg by mouth every morning. Hold for SBP < 100.    Marland Kitchen WARFARIN SODIUM 3 MG PO TABS Oral Take 3 mg by mouth every evening.      BP 125/80  Pulse 101  Temp(Src) 98.2 F (36.8 C) (Oral)  Resp 20  Ht 6' (1.829 m)  Wt 193 lb (87.544 kg)  BMI 26.18 kg/m2  SpO2 95%  Physical Exam  Nursing note and vitals reviewed. Constitutional: He is oriented to person, place, and time. He appears well-developed and well-nourished. No distress.  HENT:  Head: Normocephalic and atraumatic.  Right Ear: External ear normal.  Left Ear: External ear normal.  Nose: Nose normal.  Mouth/Throat: Oropharynx is clear and moist. No oropharyngeal exudate.  Eyes: Conjunctivae are normal. Pupils are equal, round, and reactive to light. No scleral icterus.  Neck: Normal range of motion. Neck supple. No JVD present. Carotid bruit is not present.  Cardiovascular: Normal rate and normal heart sounds.  Exam reveals no gallop and no  friction rub.   No murmur heard.      Slightly irregular rhythm  Pulmonary/Chest: Effort normal and breath sounds normal. No respiratory distress. He has no wheezes. He has no rales. He exhibits no tenderness.  Abdominal: Soft. Bowel sounds are normal. He exhibits no distension. There is no tenderness.  Musculoskeletal: Normal range of motion. He exhibits no edema and no tenderness.  Lymphadenopathy:    He has no cervical adenopathy.  Neurological: He is alert and oriented to person, place, and time. He has normal reflexes. No cranial nerve deficit. Coordination normal.  Skin: Skin is warm and dry.  No rash noted. No erythema. No pallor.  Psychiatric: He has a normal mood and affect. His behavior is normal. Judgment and thought content normal.    ED Course  Procedures (including critical care time)  Labs Reviewed  CBC - Abnormal; Notable for the following:    RBC 3.77 (*)    Hemoglobin 11.9 (*)    HCT 36.4 (*)    RDW 16.3 (*)    Platelets 112 (*)    All other components within normal limits  DIFFERENTIAL - Abnormal; Notable for the following:    Neutrophils Relative 38 (*)    Monocytes Relative 18 (*)    All other components within normal limits  COMPREHENSIVE METABOLIC PANEL - Abnormal; Notable for the following:    Glucose, Bld 130 (*)    Albumin 3.4 (*)    GFR calc non Af Amer 58 (*)    GFR calc Af Amer 67 (*)    All other components within normal limits  URINALYSIS, ROUTINE W REFLEX MICROSCOPIC - Abnormal; Notable for the following:    Hgb urine dipstick SMALL (*)    Leukocytes, UA LARGE (*)    All other components within normal limits  URINE MICROSCOPIC-ADD ON - Abnormal; Notable for the following:    Casts HYALINE CASTS (*)    All other components within normal limits  CARDIAC PANEL(CRET KIN+CKTOT+MB+TROPI)   No results found.  Date: 07/12/2011  Rate: 87  Rhythm: normal sinus rhythm  QRS Axis: normal  Intervals: normal  ST/T Wave abnormalities: normal   Conduction Disutrbances:right bundle branch block  Narrative Interpretation: EKG reads Atrial flutter, though we believe there are p waves present - reviewed by Dr. Adriana Simas - sinus arrythmia  Old EKG Reviewed: unchanged    1. Near syncope       MDM  Though the patient is 70, he is a quite healthy 19, I have found no cause for the near syncope with the exception of UTI and believe that admission would be more detrimental to the patient than the near syncope.  I have discussed this patient with Dr. Adriana Simas, who has seen the patient with me and agrees with the plan.  The patient, himself does not wish to stay in the hospital as well.        Izola Price [place], [place] 07/12/11 [phone]

## 2011-08-16 ENCOUNTER — Inpatient Hospital Stay (HOSPITAL_COMMUNITY)
Admission: EM | Admit: 2011-08-16 | Discharge: 2011-08-21 | DRG: 372 | Disposition: A | Discharge status: Home Health | Payer: Medicare Other | Attending: Internal Medicine | Admitting: Internal Medicine

## 2011-08-16 ENCOUNTER — Emergency Department (HOSPITAL_COMMUNITY): Payer: Medicare Other

## 2011-08-16 ENCOUNTER — Encounter (HOSPITAL_COMMUNITY): Payer: Self-pay | Admitting: Emergency Medicine

## 2011-08-16 DIAGNOSIS — I480 Paroxysmal atrial fibrillation: Secondary | ICD-10-CM | POA: Diagnosis present

## 2011-08-16 DIAGNOSIS — E876 Hypokalemia: Secondary | ICD-10-CM

## 2011-08-16 DIAGNOSIS — E785 Hyperlipidemia, unspecified: Secondary | ICD-10-CM

## 2011-08-16 DIAGNOSIS — D72829 Elevated white blood cell count, unspecified: Secondary | ICD-10-CM

## 2011-08-16 DIAGNOSIS — D649 Anemia, unspecified: Secondary | ICD-10-CM

## 2011-08-16 DIAGNOSIS — D509 Iron deficiency anemia, unspecified: Secondary | ICD-10-CM | POA: Diagnosis present

## 2011-08-16 DIAGNOSIS — I4891 Unspecified atrial fibrillation: Secondary | ICD-10-CM | POA: Diagnosis present

## 2011-08-16 DIAGNOSIS — A0471 Enterocolitis due to Clostridium difficile, recurrent: Secondary | ICD-10-CM | POA: Diagnosis present

## 2011-08-16 DIAGNOSIS — I10 Essential (primary) hypertension: Secondary | ICD-10-CM

## 2011-08-16 DIAGNOSIS — E1165 Type 2 diabetes mellitus with hyperglycemia: Secondary | ICD-10-CM

## 2011-08-16 DIAGNOSIS — E118 Type 2 diabetes mellitus with unspecified complications: Secondary | ICD-10-CM

## 2011-08-16 DIAGNOSIS — A0472 Enterocolitis due to Clostridium difficile, not specified as recurrent: Principal | ICD-10-CM

## 2011-08-16 DIAGNOSIS — Z66 Do not resuscitate: Secondary | ICD-10-CM | POA: Diagnosis present

## 2011-08-16 DIAGNOSIS — Z951 Presence of aortocoronary bypass graft: Secondary | ICD-10-CM

## 2011-08-16 DIAGNOSIS — L89309 Pressure ulcer of unspecified buttock, unspecified stage: Secondary | ICD-10-CM | POA: Diagnosis present

## 2011-08-16 DIAGNOSIS — E039 Hypothyroidism, unspecified: Secondary | ICD-10-CM

## 2011-08-16 DIAGNOSIS — R531 Weakness: Secondary | ICD-10-CM

## 2011-08-16 DIAGNOSIS — E46 Unspecified protein-calorie malnutrition: Secondary | ICD-10-CM | POA: Diagnosis present

## 2011-08-16 DIAGNOSIS — R5381 Other malaise: Secondary | ICD-10-CM | POA: Diagnosis present

## 2011-08-16 DIAGNOSIS — IMO0002 Reserved for concepts with insufficient information to code with codable children: Secondary | ICD-10-CM

## 2011-08-16 DIAGNOSIS — L8992 Pressure ulcer of unspecified site, stage 2: Secondary | ICD-10-CM | POA: Diagnosis present

## 2011-08-16 DIAGNOSIS — E860 Dehydration: Secondary | ICD-10-CM | POA: Diagnosis present

## 2011-08-16 DIAGNOSIS — R197 Diarrhea, unspecified: Secondary | ICD-10-CM

## 2011-08-16 DIAGNOSIS — I2510 Atherosclerotic heart disease of native coronary artery without angina pectoris: Secondary | ICD-10-CM

## 2011-08-16 HISTORY — DX: Enterocolitis due to Clostridium difficile, not specified as recurrent: A04.72

## 2011-08-16 HISTORY — DX: Hyperlipidemia, unspecified: E78.5

## 2011-08-16 LAB — POCT I-STAT, CHEM 8
BUN: 9 mg/dL (ref 6–23)
Calcium, Ion: 1.11 mmol/L — ABNORMAL LOW (ref 1.12–1.32)
Chloride: 108 meq/L (ref 96–112)
Creatinine, Ser: 0.8 mg/dL (ref 0.50–1.35)
Glucose, Bld: 168 mg/dL — ABNORMAL HIGH (ref 70–99)
HCT: 34 % — ABNORMAL LOW (ref 39.0–52.0)
Hemoglobin: 11.6 g/dL — ABNORMAL LOW (ref 13.0–17.0)
Potassium: 3.3 meq/L — ABNORMAL LOW (ref 3.5–5.1)
Sodium: 143 meq/L (ref 135–145)
TCO2: 24 mmol/L (ref 0–100)

## 2011-08-16 LAB — DIFFERENTIAL
Basophils Absolute: 0 K/uL (ref 0.0–0.1)
Basophils Relative: 0 % (ref 0–1)
Eosinophils Absolute: 0 K/uL (ref 0.0–0.7)
Eosinophils Relative: 0 % (ref 0–5)
Lymphocytes Relative: 9 % — ABNORMAL LOW (ref 12–46)
Lymphs Abs: 1.3 K/uL (ref 0.7–4.0)
Monocytes Absolute: 1.1 K/uL — ABNORMAL HIGH (ref 0.1–1.0)
Monocytes Relative: 8 % (ref 3–12)
Neutro Abs: 11.6 K/uL — ABNORMAL HIGH (ref 1.7–7.7)
Neutrophils Relative %: 83 % — ABNORMAL HIGH (ref 43–77)

## 2011-08-16 LAB — GLUCOSE, CAPILLARY
Glucose-Capillary: 177 mg/dL — ABNORMAL HIGH (ref 70–99)
Glucose-Capillary: 225 mg/dL — ABNORMAL HIGH (ref 70–99)
Glucose-Capillary: 216 mg/dL — ABNORMAL HIGH (ref 70–99)

## 2011-08-16 LAB — CBC
HCT: 34.9 % — ABNORMAL LOW (ref 39.0–52.0)
MCH: 31.8 pg (ref 26.0–34.0)
MCV: 94.8 fL (ref 78.0–100.0)
Platelets: 78 10*3/uL — ABNORMAL LOW (ref 150–400)
RDW: 16.2 % — ABNORMAL HIGH (ref 11.5–15.5)

## 2011-08-16 LAB — URINALYSIS, ROUTINE W REFLEX MICROSCOPIC
Bilirubin Urine: NEGATIVE
Glucose, UA: NEGATIVE mg/dL
Ketones, ur: 15 mg/dL — AB
Protein, ur: NEGATIVE mg/dL
Urobilinogen, UA: 0.2 mg/dL (ref 0.0–1.0)

## 2011-08-16 LAB — URINE MICROSCOPIC-ADD ON

## 2011-08-16 LAB — LACTIC ACID, PLASMA: Lactic Acid, Venous: 1.7 mmol/L (ref 0.5–2.2)

## 2011-08-16 MED ORDER — EZETIMIBE 10 MG PO TABS
10.0000 mg | ORAL_TABLET | Freq: Every day | ORAL | Status: DC
  Administered 2011-08-17 – 2011-08-20 (×5): 10 mg via ORAL
  Filled 2011-08-16 (×6): qty 1

## 2011-08-16 MED ORDER — SODIUM CHLORIDE 0.9 % IV SOLN: INTRAVENOUS | Status: DC

## 2011-08-16 MED ORDER — VANCOMYCIN 50 MG/ML ORAL SOLUTION: 125.0000 mg | ORAL | Status: DC

## 2011-08-16 MED ORDER — INSULIN ASPART 100 UNIT/ML ~~LOC~~ SOLN
0.0000 [IU] | Freq: Three times a day (TID) | SUBCUTANEOUS | Status: DC
  Administered 2011-08-17 – 2011-08-18 (×3): 2 [IU] via SUBCUTANEOUS
  Administered 2011-08-18: 1 [IU] via SUBCUTANEOUS
  Administered 2011-08-18: 3 [IU] via SUBCUTANEOUS
  Administered 2011-08-19 – 2011-08-20 (×2): 2 [IU] via SUBCUTANEOUS
  Administered 2011-08-21: 3 [IU] via SUBCUTANEOUS
  Filled 2011-08-16: qty 3

## 2011-08-16 MED ORDER — VANCOMYCIN 50 MG/ML ORAL SOLUTION: 125.0000 mg | Freq: Every day | ORAL | Status: DC

## 2011-08-16 MED ORDER — VITAMIN D3 25 MCG (1000 UNIT) PO TABS
2000.0000 [IU] | ORAL_TABLET | Freq: Every morning | ORAL | Status: DC
  Administered 2011-08-17 – 2011-08-21 (×5): 2000 [IU] via ORAL
  Filled 2011-08-16 (×5): qty 2

## 2011-08-16 MED ORDER — FINASTERIDE 5 MG PO TABS
5.0000 mg | ORAL_TABLET | Freq: Every morning | ORAL | Status: DC
Start: 2011-08-17 — End: 2011-08-21
  Administered 2011-08-17 – 2011-08-21 (×5): 5 mg via ORAL
  Filled 2011-08-16 (×5): qty 1

## 2011-08-16 MED ORDER — ACETAMINOPHEN 325 MG PO TABS
650.0000 mg | ORAL_TABLET | Freq: Four times a day (QID) | ORAL | Status: DC | PRN
  Administered 2011-08-20: 650 mg via ORAL
  Filled 2011-08-16: qty 2

## 2011-08-16 MED ORDER — ONDANSETRON HCL 4 MG/2ML IJ SOLN: 4.0000 mg | Freq: Four times a day (QID) | INTRAMUSCULAR | Status: DC | PRN

## 2011-08-16 MED ORDER — MIRTAZAPINE 7.5 MG PO TABS
7.5000 mg | ORAL_TABLET | Freq: Every day | ORAL | Status: DC
  Administered 2011-08-17 – 2011-08-20 (×5): 7.5 mg via ORAL
  Filled 2011-08-16 (×6): qty 1

## 2011-08-16 MED ORDER — WARFARIN SODIUM 4 MG PO TABS
4.0000 mg | ORAL_TABLET | Freq: Once | ORAL | Status: AC
  Administered 2011-08-16: 4 mg via ORAL
  Filled 2011-08-16: qty 1

## 2011-08-16 MED ORDER — SACCHAROMYCES BOULARDII 250 MG PO CAPS
250.0000 mg | ORAL_CAPSULE | Freq: Two times a day (BID) | ORAL | Status: DC
  Administered 2011-08-16 – 2011-08-21 (×11): 250 mg via ORAL
  Filled 2011-08-16 (×14): qty 1

## 2011-08-16 MED ORDER — GEMFIBROZIL 600 MG PO TABS
600.0000 mg | ORAL_TABLET | Freq: Two times a day (BID) | ORAL | Status: DC
  Administered 2011-08-17 – 2011-08-21 (×9): 600 mg via ORAL
  Filled 2011-08-16 (×12): qty 1

## 2011-08-16 MED ORDER — WARFARIN - PHARMACIST DOSING INPATIENT: Freq: Every day | Status: DC

## 2011-08-16 MED ORDER — SODIUM CHLORIDE 0.9 % IV SOLN
INTRAVENOUS | Status: AC
  Administered 2011-08-16: via INTRAVENOUS
  Administered 2011-08-16: 75 mL/h via INTRAVENOUS
  Administered 2011-08-17: 06:00:00 via INTRAVENOUS

## 2011-08-16 MED ORDER — ACETAMINOPHEN 650 MG RE SUPP: 650.0000 mg | Freq: Four times a day (QID) | RECTAL | Status: DC | PRN

## 2011-08-16 MED ORDER — ENSURE CLINICAL ST REVIGOR PO LIQD
237.0000 mL | Freq: Two times a day (BID) | ORAL | Status: DC
  Administered 2011-08-16 – 2011-08-17 (×2): 237 mL via ORAL
  Filled 2011-08-16 (×4): qty 237

## 2011-08-16 MED ORDER — ASPIRIN EC 81 MG PO TBEC
81.0000 mg | DELAYED_RELEASE_TABLET | ORAL | Status: DC
  Administered 2011-08-17 – 2011-08-21 (×5): 81 mg via ORAL
  Filled 2011-08-16 (×7): qty 1

## 2011-08-16 MED ORDER — OMEGA-3-ACID ETHYL ESTERS 1 G PO CAPS
1.0000 g | ORAL_CAPSULE | ORAL | Status: DC
  Administered 2011-08-17 – 2011-08-21 (×5): 1 g via ORAL
  Filled 2011-08-16 (×7): qty 1

## 2011-08-16 MED ORDER — LEVOTHYROXINE SODIUM 25 MCG PO TABS
25.0000 ug | ORAL_TABLET | Freq: Every day | ORAL | Status: DC
  Administered 2011-08-16 – 2011-08-21 (×6): 25 ug via ORAL
  Filled 2011-08-16 (×6): qty 1

## 2011-08-16 MED ORDER — GLYBURIDE 5 MG PO TABS
5.0000 mg | ORAL_TABLET | Freq: Every day | ORAL | Status: DC
  Administered 2011-08-17 – 2011-08-21 (×5): 5 mg via ORAL
  Filled 2011-08-16 (×6): qty 1

## 2011-08-16 MED ORDER — POTASSIUM CHLORIDE CRYS ER 20 MEQ PO TBCR
20.0000 meq | EXTENDED_RELEASE_TABLET | Freq: Once | ORAL | Status: AC
  Administered 2011-08-16: 20 meq via ORAL
  Filled 2011-08-16: qty 1

## 2011-08-16 MED ORDER — VANCOMYCIN 50 MG/ML ORAL SOLUTION
125.0000 mg | Freq: Four times a day (QID) | ORAL | Status: DC
  Administered 2011-08-16 – 2011-08-21 (×20): 125 mg via ORAL
  Filled 2011-08-16 (×26): qty 2.5

## 2011-08-16 MED ORDER — LORAZEPAM 0.5 MG PO TABS
0.5000 mg | ORAL_TABLET | Freq: Every evening | ORAL | Status: DC | PRN
  Administered 2011-08-18 – 2011-08-20 (×3): 0.5 mg via ORAL
  Filled 2011-08-16 (×3): qty 1

## 2011-08-16 MED ORDER — WARFARIN SODIUM 3 MG PO TABS
3.0000 mg | ORAL_TABLET | Freq: Every evening | ORAL | Status: DC
  Filled 2011-08-16: qty 1

## 2011-08-16 MED ORDER — ONDANSETRON HCL 4 MG PO TABS: 4.0000 mg | ORAL_TABLET | Freq: Four times a day (QID) | ORAL | Status: DC | PRN

## 2011-08-16 MED ORDER — TAMSULOSIN HCL 0.4 MG PO CAPS
0.4000 mg | ORAL_CAPSULE | ORAL | Status: DC
  Administered 2011-08-16 – 2011-08-21 (×6): 0.4 mg via ORAL
  Filled 2011-08-16 (×8): qty 1

## 2011-08-16 MED ORDER — SODIUM CHLORIDE 0.9 % IV BOLUS (SEPSIS)
1000.0000 mL | Freq: Once | INTRAVENOUS | Status: AC
  Administered 2011-08-16: 1000 mL via INTRAVENOUS

## 2011-08-16 MED ORDER — VANCOMYCIN 50 MG/ML ORAL SOLUTION: 125.0000 mg | Freq: Two times a day (BID) | ORAL | Status: DC

## 2011-08-16 NOTE — ED Provider Notes (Addendum)
History     CSN: [id number]  Arrival date & time 08/16/11  0503   First MD Initiated Contact with Patient 08/16/11 [phone]      Chief Complaint  Patient presents with  . Weakness  . Diarrhea    (Consider location/radiation/quality/duration/timing/severity/associated sxs/prior treatment) Patient is a 78 y.o. male presenting with weakness and diarrhea. The history is provided by the patient. No language interpreter was used.  Weakness Primary symptoms do not include headaches, syncope, loss of consciousness, altered mental status, seizures, dizziness, visual change, paresthesias, focal weakness, memory loss, fever, nausea or vomiting. Primary symptoms comment: general weakness with > 10 episodes of diarrhea The symptoms began 12 to 24 hours ago. The symptoms are worsening. The neurological symptoms are diffuse. Context: diarrhea.  Additional symptoms include weakness. Additional symptoms do not include neck stiffness, lower back pain, hearing loss or dysphoric mood. Medical issues do not include seizures, cerebral vascular accident, cancer or recent surgery. Workup history does not include MRI.  Diarrhea The primary symptoms include fatigue and diarrhea. Primary symptoms do not include fever, abdominal pain, nausea, vomiting, hematemesis, jaundice, hematochezia, dysuria, myalgias or rash. Primary symptoms comment: general weakness with > 10 episodes of diarrhea The illness began yesterday. The onset was sudden. The problem has not changed since onset. The diarrhea began yesterday. The diarrhea is watery. The diarrhea occurs more than 10 times per day. Risk factors for illness producing diarrhea include recent antibiotic use.  The illness is also significant for anorexia. The illness does not include chills, back pain or itching. Associated medical issues do not include gastric bypass. Risk factors: none h/o c diff.  Denies chest pain, shortness of breath, cough.  Diarrhea started within 2 days of  stopping flagyl.  No focal weakness or numbness  Past Medical History  Diagnosis Date  . Myocardial infarct   . Diabetes mellitus   . Hypertension   . CHF (congestive heart failure)   . Coronary artery disease   . Dysrhythmia   . Clostridium difficile diarrhea     Past Surgical History  Procedure Date  . Coronary artery bypass graft   . Cholecystectomy     No family history on file.  History  Substance Use Topics  . Smoking status: Former Smoker -- 1.0 packs/day    Types: Cigarettes    Quit date: 05/08/1974  . Smokeless tobacco: Never Used  . Alcohol Use: No     occasionally      Review of Systems  Constitutional: Positive for fatigue. Negative for fever and chills.  HENT: Negative for hearing loss and neck stiffness.   Eyes: Negative.   Respiratory: Negative.  Negative for cough and shortness of breath.   Cardiovascular: Negative for chest pain and syncope.  Gastrointestinal: Positive for diarrhea and anorexia. Negative for nausea, vomiting, abdominal pain, hematochezia, abdominal distention, hematemesis and jaundice.  Genitourinary: Negative for dysuria.  Musculoskeletal: Negative for myalgias and back pain.  Skin: Negative for itching and rash.  Neurological: Positive for weakness. Negative for dizziness, focal weakness, seizures, loss of consciousness, headaches and paresthesias.  Hematological: Negative.   Psychiatric/Behavioral: Negative.  Negative for memory loss, dysphoric mood and altered mental status.    Allergies  Morphine and related  Home Medications   Current Outpatient Rx  Name Route Sig Dispense Refill  . ACETAMINOPHEN 500 MG PO TABS Oral Take 1,000 mg by mouth at bedtime as needed. For pain     . ASPIRIN 81 MG PO TABS Oral Take 81 mg by  mouth every morning.     Marland Kitchen VITAMIN D 1000 UNITS PO TABS Oral Take 2,000 Units by mouth every morning.     Marland Kitchen EZETIMIBE 10 MG PO TABS Oral Take 10 mg by mouth at bedtime.     . ENSURE CLINICAL ST REVIGOR PO  LIQD Oral Take 237 mLs by mouth 2 (two) times daily between meals.    Marland Kitchen FINASTERIDE 5 MG PO TABS Oral Take 5 mg by mouth every morning.     Marland Kitchen OMEGA-3 FATTY ACIDS 1000 MG PO CAPS Oral Take 1 g by mouth every morning.     . FUROSEMIDE 20 MG PO TABS Oral Take 20 mg by mouth every other day. Hold for SBP < 100.    Marland Kitchen GEMFIBROZIL 600 MG PO TABS Oral Take 600 mg by mouth 2 (two) times daily before a meal.      . GLYBURIDE 5 MG PO TABS Oral Take 5 mg by mouth.    Marland Kitchen HYDROCORTISONE 1 % EX CREA Topical Apply 1 application topically 2 (two) times daily. itchy back rash     . LEVOTHYROXINE SODIUM 25 MCG PO TABS Oral Take 25 mcg by mouth daily.     Marland Kitchen LORAZEPAM 0.5 MG PO TABS Oral Take 0.5 mg by mouth at bedtime as needed. For insomnia.    Marland Kitchen CALMOSEPTINE EX Apply externally Apply 1 application topically daily. Apply to buttocks every shift     . MIRTAZAPINE 7.5 MG PO TABS Oral Take 7.5 mg by mouth at bedtime.      Marland Kitchen SACCHAROMYCES BOULARDII 250 MG PO CAPS Oral Take 250 mg by mouth 2 (two) times daily. Take for 14 days    . TAMSULOSIN HCL 0.4 MG PO CAPS Oral Take 0.4 mg by mouth every morning. Hold for SBP < 100.    Marland Kitchen WARFARIN SODIUM 3 MG PO TABS Oral Take 3 mg by mouth every evening.      BP 113/64  Pulse 100  Temp(Src) 99.8 F (37.7 C) (Oral)  Resp 18  SpO2 94%  Physical Exam  Constitutional: He appears well-developed. No distress.  HENT:  Head: Normocephalic and atraumatic.  Eyes: Conjunctivae are normal. Pupils are equal, round, and reactive to light.  Neck: Normal range of motion. Neck supple.  Cardiovascular: Normal rate and regular rhythm.   Pulmonary/Chest: Effort normal and breath sounds normal. He has no rales.  Abdominal: Soft. Bowel sounds are normal. He exhibits no distension. There is no rebound and no guarding.  Musculoskeletal: Normal range of motion. He exhibits no edema.  Neurological: He is alert. He has normal reflexes.  Skin: Skin is warm and dry.  Psychiatric: He has a normal  mood and affect.    ED Course  Procedures (including critical care time)   Labs Reviewed  URINALYSIS, ROUTINE W REFLEX MICROSCOPIC  URINE CULTURE  STOOL CULTURE  CLOSTRIDIUM DIFFICILE BY PCR   No results found.   No diagnosis found.    MDM  Bolus fluids        Bach Rocchi K Jonathandavid Marlett-Rasch, MD 08/16/11 0615  Josedaniel Haye K Teddie Mehta-Rasch, MD 08/16/11 [phone]

## 2011-08-16 NOTE — ED Notes (Signed)
[id number]<AV> Expected date:<BR> Expected time:<BR> Means of arrival:<BR> Comments:<BR> Elderly-hx c-diff/hypotension-SBP 80&#39;s

## 2011-08-16 NOTE — ED Notes (Signed)
Patient c/o weakness, and diarrhea that started yesterday morning around 0900 am. Pt also had low blood pressure of 80/42 before 500 ml bolus of NS. Patient has history of c-diff.

## 2011-08-16 NOTE — ED Notes (Signed)
Pt resting in bed quietly at this time. Pt family is at bedside. Pt awaiting admit.

## 2011-08-16 NOTE — ED Notes (Signed)
Pt informed to call nursing staff when he had to urinate or have bowel movement.

## 2011-08-16 NOTE — Progress Notes (Signed)
ANTICOAGULATION CONSULT NOTE - Initial Consult  Pharmacy Consult for Coumadin Indication: atrial fibrillation  Allergies  Allergen Reactions  . Morphine And Related Other (See Comments)    Reaction unknown    Patient Measurements:   Vital Signs: Temp: 98.9 F (37.2 C) (03/06 1727) Temp src: Oral (03/06 1727) BP: 107/55 mmHg (03/06 1727) Pulse Rate: 111  (03/06 1727)  Labs:  Basename 08/16/11 0636 08/16/11 0620  HGB 11.6* 11.7*  HCT 34.0* 34.9*  PLT -- 78*  APTT -- --  LABPROT -- --  INR -- --  HEPARINUNFRC -- --  CREATININE 0.80 --  CKTOTAL -- --  CKMB -- --  TROPONINI -- --   CrCl 51 ml/min (normalized)  Medical History: Past Medical History  Diagnosis Date  . Myocardial infarct   . Diabetes mellitus   . Hypertension   . CHF (congestive heart failure)   . Coronary artery disease   . Dysrhythmia   . Clostridium difficile diarrhea   . Cancer     Bladder cancer  . Hyperlipidemia     Medications:  Scheduled:    . ezetimibe  10 mg Oral QHS  . feeding supplement  237 mL Oral BID BM  . gemfibrozil  600 mg Oral BID AC  . levothyroxine  25 mcg Oral Daily  . mirtazapine  7.5 mg Oral QHS  . potassium chloride  20 mEq Oral Once  . saccharomyces boulardii  250 mg Oral BID  . sodium chloride  1,000 mL Intravenous Once  . Tamsulosin HCl  0.4 mg Oral [id number]  . vancomycin  125 mg Oral QID   Followed by  . vancomycin  125 mg Oral BID   Followed by  . vancomycin  125 mg Oral Daily   Followed by  . vancomycin  125 mg Oral QODAY   Followed by  . vancomycin  125 mg Oral Q3 days  . DISCONTD: sodium chloride   Intravenous STAT    Assessment:  89 YOM presents with diarrhea, recurrent C.diff colitis  Chronic coumadin for Afib  Home dose 3mg  daily  INR today subtherapeutic 1.74, increase dose carefully today, anticipating decreased coumadin requirements with decreased vitamin K intake with diarrhea   Goal of Therapy:  INR 2-3   Plan:   Increase  coumadin 4mg  today  Daily PT/INR  Loralee Pacas, PharmD, BCPS Pager: [phone] 08/16/2011,5:44 PM

## 2011-08-16 NOTE — H&P (Signed)
Patient's PCP: [name], MD, MD  Chief Complaint: Diarrhea  History of Present Illness: David Davidson is a 78 y.o. Caucasian male with history of diabetes type 2, hypertension, coronary artery disease, C. difficile colitis since December of 2012 with recurrence in January 2013 who presents with the above complaints.  Patient notes that in November of 2012 he was diagnosed with pneumonia and was treated with antibiotics.  Subsequently after being treated with antibiotics he developed diarrhea and was eventually diagnosed with C. difficile colitis.  He completed a course of antibiotics for C. difficile colitis in December of 2012, he presented to the hospital in January of 2013 with diarrhea and was found to have recurrent C. difficile colitis.  He completed a two-week course of oral vancomycin, he had followed up with his primary care physician who put him on metronidazole 2 week course which he most recently completed on March 3 of 2013.  Over the last 24-48 hours, patient has had greater than 10 episodes of loose bowel movements.  He also felt feverish at home and had a low-grade temperature.  He complained of chills.  Denies any nausea or vomiting.  Denies any chest pain or shortness of breath.  Denies any headaches or vision changes.  Given his diarrhea he was brought to the hospital for further evaluation.  Meds: Scheduled Meds:   . potassium chloride  20 mEq Oral Once  . sodium chloride  1,000 mL Intravenous Once  . vancomycin  125 mg Oral QID   Followed by  . vancomycin  125 mg Oral BID   Followed by  . vancomycin  125 mg Oral Daily   Followed by  . vancomycin  125 mg Oral QODAY   Followed by  . vancomycin  125 mg Oral Q3 days  . DISCONTD: sodium chloride   Intravenous STAT   Continuous Infusions:   . sodium chloride     PRN Meds:. Allergies: Morphine and related Past Medical History  Diagnosis Date  . Myocardial infarct   . Diabetes mellitus   . Hypertension   .  CHF (congestive heart failure)   . Coronary artery disease   . Dysrhythmia   . Clostridium difficile diarrhea    Past Surgical History  Procedure Date  . Coronary artery bypass graft   . Cholecystectomy    Family History  Problem Relation Age of Onset  . Breast cancer Mother   . Coronary artery disease Father    History   Social History  . Marital Status: Widowed    Spouse Name: N/A    Number of Children: N/A  . Years of Education: N/A   Occupational History  . Not on file.   Social History Main Topics  . Smoking status: Former Smoker -- 1.0 packs/day    Types: Cigarettes    Quit date: 05/08/1974  . Smokeless tobacco: Never Used  . Alcohol Use: No     occasionally  . Drug Use: No  . Sexually Active: No   Other Topics Concern  . Not on file   Social History Narrative  . No narrative on file   Review of Systems: All systems reviewed with the patient and positive as per history of present illness, otherwise all other systems are negative.  Physical Exam: Blood pressure 111/58, pulse 106, temperature 99.5 F (37.5 C), temperature source Oral, resp. rate 18, SpO2 89.00%. General: Awake, Oriented x3, No acute distress. HEENT: EOMI, dry mucous membranes. Neck: Supple CV: S1 and S2 Lungs: Clear  to ascultation bilaterally Abdomen: Soft, Nontender, Nondistended, +bowel sounds. Ext: Good pulses. Trace edema. No clubbing or cyanosis noted. Neuro: Cranial Nerves II-XII grossly intact. Has 5/5 motor strength in upper and lower extremities.  Lab results:  [facility] 08/16/11 0636  NA 143  K 3.3*  CL 108  CO2 --  GLUCOSE 168*  BUN 9  CREATININE 0.80  CALCIUM --  MG --  PHOS --   No results found for this basename: AST:2,ALT:2,ALKPHOS:2,BILITOT:2,PROT:2,ALBUMIN:2 in the last 72 hours No results found for this basename: LIPASE:2,AMYLASE:2 in the last 72 hours  Basename 08/16/11 0636 08/16/11 0620  WBC -- 14.0*  NEUTROABS -- 11.6*  HGB 11.6* 11.7*  HCT 34.0*  34.9*  MCV -- 94.8  PLT -- 78*   No results found for this basename: CKTOTAL:3,CKMB:3,CKMBINDEX:3,TROPONINI:3 in the last 72 hours No components found with this basename: POCBNP:3 No results found for this basename: DDIMER in the last 72 hours No results found for this basename: HGBA1C:2 in the last 72 hours No results found for this basename: CHOL:2,HDL:2,LDLCALC:2,TRIG:2,CHOLHDL:2,LDLDIRECT:2 in the last 72 hours No results found for this basename: TSH,T4TOTAL,FREET3,T3FREE,THYROIDAB in the last 72 hours No results found for this basename: VITAMINB12:2,FOLATE:2,FERRITIN:2,TIBC:2,IRON:2,RETICCTPCT:2 in the last 72 hours Imaging results:  Dg Abd Acute W/chest  08/16/2011  *RADIOLOGY REPORT*  Clinical Data: Mid abdominal pain, diarrhea, history hypertension, diabetes, coronary disease post MI, CHF  ACUTE ABDOMEN SERIES (ABDOMEN 2 VIEW & CHEST 1 VIEW)  Comparison: Chest radiograph 06/25/2011  Findings: Enlargement of cardiac silhouette post CABG. Pulmonary vascular congestion. Left basilar atelectasis without infiltrate or effusion. Skin fold projects over left lung. Surgical clips right upper quadrant. Few nonspecific minimally prominent small bowel loops in abdomen. No definite evidence of bowel obstruction or bowel wall thickening. No free intraperitoneal air. Osseous demineralization with degenerative disc disease changes lumbar spine.  IMPRESSION: No definite acute abdominal findings. Enlargement of cardiac silhouette post CABG. Left basilar atelectasis.  Original Report Authenticated By: Lollie Marrow, M.D.    Assessment & Plan by Problem: 1. Diarrhea likely due to C. difficile colitis, recurrent.  Will send stool for C. difficile PCR.  Will start the patient on oral vancomycin taper, given that this is third episode over the last 4 months suspect the patient will need 6 week taper.  Continue Florastar.  2. Dehydration.  Likely diarrhea.  Hold furosemide and gently hydrate the patient.  3.   History of coronary artery disease.  Stable.  Continue aspirin.  4.  Paroxysmal a-fib.  Rate controlled.  Continue anticoagulate on Coumadin.  5.  DM (diabetes mellitus), type 2, uncontrolled with complications.  Sensitive sliding scale insulin.  Continue home dose of glyburide.  6.  Hypertension.  Stable continue home medications.  7.  Hyperlipidemia.  Continue Gemfibrozil.  8.  Hypothyroidism.  Continue levothyroxine.  9.  Weakness generalized.  Likely due to diarrhea and dehydration.  Will have PT/OT evaluate the patient.  10.  Hypokalemia.  Replace as needed, likely due to diarrhea.  11.  Leukocytosis.  Likely due to diarrhea.  Monitor.  12.  Anemia.  Will send for anemia panel.  Maybe due to chronic disease from C. difficile colitis.  13.  Prophylaxis.  Continue anticoagulation on Coumadin.  14.  CODE STATUS.  DO NOT RESUSCITATE/DO NOT INTUBATE discussed with patient and son at the time of admission.  Time spent on admission, talking to the patient, and coordinating care was: 60 mins.  Jonah Gingras A, MD 08/16/2011, 8:53 AM

## 2011-08-16 NOTE — ED Notes (Signed)
Pt currently having blood drawn. Pt informed of the order for in-out cath however he would like to attempt to urinate.

## 2011-08-16 NOTE — ED Notes (Signed)
Patient transported to X-ray 

## 2011-08-16 NOTE — ED Notes (Signed)
Admitting Md at bedside

## 2011-08-17 LAB — COMPREHENSIVE METABOLIC PANEL
AST: 12 U/L (ref 0–37)
Albumin: 2.3 g/dL — ABNORMAL LOW (ref 3.5–5.2)
BUN: 14 mg/dL (ref 6–23)
Calcium: 7.8 mg/dL — ABNORMAL LOW (ref 8.4–10.5)
Chloride: 113 mEq/L — ABNORMAL HIGH (ref 96–112)
Creatinine, Ser: 0.76 mg/dL (ref 0.50–1.35)
Total Protein: 5.5 g/dL — ABNORMAL LOW (ref 6.0–8.3)

## 2011-08-17 LAB — URINE CULTURE: Colony Count: 60000

## 2011-08-17 LAB — PROTIME-INR
INR: 1.83 — ABNORMAL HIGH (ref 0.00–1.49)
Prothrombin Time: 21.5 seconds — ABNORMAL HIGH (ref 11.6–15.2)

## 2011-08-17 LAB — VITAMIN B12: Vitamin B-12: 431 pg/mL (ref 211–911)

## 2011-08-17 LAB — IRON AND TIBC
Iron: 10 ug/dL — ABNORMAL LOW (ref 42–135)
Saturation Ratios: 6 % — ABNORMAL LOW (ref 20–55)
TIBC: 155 ug/dL — ABNORMAL LOW (ref 215–435)
UIBC: 145 ug/dL (ref 125–400)

## 2011-08-17 LAB — RETICULOCYTES: RBC.: 3.34 MIL/uL — ABNORMAL LOW (ref 4.22–5.81)

## 2011-08-17 LAB — FOLATE: Folate: 19.5 ng/mL

## 2011-08-17 MED ORDER — GLUCERNA SHAKE PO LIQD
237.0000 mL | Freq: Three times a day (TID) | ORAL | Status: DC
  Administered 2011-08-17 – 2011-08-21 (×12): 237 mL via ORAL
  Filled 2011-08-17 (×16): qty 237

## 2011-08-17 MED ORDER — POLYSACCHARIDE IRON COMPLEX 150 MG PO CAPS
150.0000 mg | ORAL_CAPSULE | Freq: Every day | ORAL | Status: DC
  Administered 2011-08-17 – 2011-08-21 (×5): 150 mg via ORAL
  Filled 2011-08-17 (×5): qty 1

## 2011-08-17 MED ORDER — SODIUM CHLORIDE 0.9 % IV SOLN
INTRAVENOUS | Status: DC
  Administered 2011-08-17 – 2011-08-18 (×2): via INTRAVENOUS
  Administered 2011-08-19: 1000 mL via INTRAVENOUS
  Administered 2011-08-20 – 2011-08-21 (×2): via INTRAVENOUS

## 2011-08-17 MED ORDER — SODIUM CHLORIDE 0.9 % IV BOLUS (SEPSIS)
500.0000 mL | Freq: Once | INTRAVENOUS | Status: AC
  Administered 2011-08-17: 500 mL via INTRAVENOUS

## 2011-08-17 MED ORDER — POTASSIUM CHLORIDE CRYS ER 20 MEQ PO TBCR
20.0000 meq | EXTENDED_RELEASE_TABLET | Freq: Once | ORAL | Status: AC
  Administered 2011-08-17: 20 meq via ORAL
  Filled 2011-08-17: qty 1

## 2011-08-17 MED ORDER — POTASSIUM CHLORIDE CRYS ER 20 MEQ PO TBCR
40.0000 meq | EXTENDED_RELEASE_TABLET | Freq: Once | ORAL | Status: AC
  Administered 2011-08-17: 40 meq via ORAL
  Filled 2011-08-17: qty 2

## 2011-08-17 MED ORDER — WARFARIN SODIUM 3 MG PO TABS
3.0000 mg | ORAL_TABLET | Freq: Once | ORAL | Status: AC
  Administered 2011-08-17: 3 mg via ORAL
  Filled 2011-08-17: qty 1

## 2011-08-17 NOTE — Progress Notes (Addendum)
ANTICOAGULATION CONSULT NOTE - F/U Consult  Pharmacy Consult for Coumadin Indication: atrial fibrillation  Allergies  Allergen Reactions  . Morphine And Related Other (See Comments)    Reaction unknown    Patient Measurements:   Vital Signs: Temp: 98 F (36.7 C) (03/07 0521) Temp src: Oral (03/07 0521) BP: 96/56 mmHg (03/07 0521) Pulse Rate: 109  (03/07 0521)  Labs:  Basename 08/17/11 0355 08/16/11 1815 08/16/11 0636 08/16/11 0620  HGB -- -- 11.6* 11.7*  HCT -- -- 34.0* 34.9*  PLT -- -- -- 78*  APTT -- -- -- --  LABPROT 21.5* 20.7* -- --  INR 1.83* 1.74* -- --  HEPARINUNFRC -- -- -- --  CREATININE 0.76 -- 0.80 --  CKTOTAL -- -- -- --  CKMB -- -- -- --  TROPONINI -- -- -- --   CrCl 51 ml/min (normalized)  Medical History: Past Medical History  Diagnosis Date  . Myocardial infarct   . Diabetes mellitus   . Hypertension   . CHF (congestive heart failure)   . Coronary artery disease   . Dysrhythmia   . Clostridium difficile diarrhea   . Cancer     Bladder cancer  . Hyperlipidemia     Medications:  Scheduled:     . aspirin EC  81 mg Oral BH-q7a  . cholecalciferol  2,000 Units Oral q morning - 10a  . ezetimibe  10 mg Oral QHS  . feeding supplement  237 mL Oral TID WC  . finasteride  5 mg Oral q morning - 10a  . gemfibrozil  600 mg Oral BID AC  . glyBURIDE  5 mg Oral Q breakfast  . insulin aspart  0-9 Units Subcutaneous TID WC  . levothyroxine  25 mcg Oral Daily  . mirtazapine  7.5 mg Oral QHS  . omega-3 acid ethyl esters  1 g Oral BH-q7a  . potassium chloride  40 mEq Oral Once  . saccharomyces boulardii  250 mg Oral BID  . sodium chloride  500 mL Intravenous Once  . Tamsulosin HCl  0.4 mg Oral BH-q7a  . vancomycin  125 mg Oral QID   Followed by  . vancomycin  125 mg Oral BID   Followed by  . vancomycin  125 mg Oral Daily   Followed by  . vancomycin  125 mg Oral QODAY   Followed by  . vancomycin  125 mg Oral Q3 days  . warfarin  4 mg Oral Once   . Warfarin - Pharmacist Dosing Inpatient   Does not apply q1800  . DISCONTD: feeding supplement  237 mL Oral BID BM  . DISCONTD: warfarin  3 mg Oral QPM    Assessment:  70 YOM presents with diarrhea, recurrent C.diff colitis  Chronic coumadin for Afib  Home dose 3mg  daily  INR up today almost tx, increase dose carefully today, anticipating decreased coumadin requirements with decreased vitamin K intake with diarrhea   Goal of Therapy:  INR 2-3   Plan:   Coumadin 3mg  today  Daily PT/INR  Gwen Her PharmD  [phone] 08/17/2011 11:54 AM

## 2011-08-17 NOTE — Progress Notes (Signed)
PT Cancellation Note  Treatment cancelled today due to medical issues with patient which prohibited therapy     Pt will diarrhea this afternoon.  Will defer OOB til tomorrow.  Donnetta Hail 08/17/2011, 3:58 PM

## 2011-08-17 NOTE — Evaluation (Signed)
Occupational Therapy Evaluation Patient Details Name: David Davidson MRN: [id number] DOB: 04/14/1911 Today's Date: 08/17/2011  Problem List:  Patient Active Problem List  Diagnoses  . CAD (coronary artery disease)  . Paroxysmal a-fib  . DM (diabetes mellitus), type 2, uncontrolled with complications  . Hypertension  . S/P CABG (coronary artery bypass graft)  . Hyperlipidemia  . Hypothyroidism  . Diarrhea  . Weakness generalized  . Dehydration  . C. difficile colitis  . Hypokalemia  . Leukocytosis  . Anemia    Past Medical History:  Past Medical History  Diagnosis Date  . Myocardial infarct   . Diabetes mellitus   . Hypertension   . CHF (congestive heart failure)   . Coronary artery disease   . Dysrhythmia   . Clostridium difficile diarrhea   . Cancer     Bladder cancer  . Hyperlipidemia    Past Surgical History:  Past Surgical History  Procedure Date  . Coronary artery bypass graft   . Cholecystectomy   . Cardiac catheterization 10/10/2001  . Cystoscopy, turbt 05/09/2006, 02/19/2006, 04/19/2005, 04/21/2003    OT Assessment/Plan/Recommendation OT Assessment Clinical Impression Statement: Pt admittted with 3rd bout with C-diff since December with deficits in areas of weakness and general fatigue from constant diarrhea who would benefit from cont OT to increase safety and I with adls so he can d/c home alone. OT Recommendation/Assessment: Patient will need skilled OT in the acute care venue OT Problem List: Decreased strength;Decreased activity tolerance Barriers to Discharge: None Barriers to Discharge Comments: Pt should be able to d/c home if diarrhea clears with S from family.  If pt gets significantly weaker, he may need SNF before returning home.  Has been [facility]. OT Therapy Diagnosis : Generalized weakness OT Plan OT Frequency: Min 2X/week OT Treatment/Interventions: Self-care/ADL training;Therapeutic activities OT Recommendation Follow Up  Recommendations: Home health OT Equipment Recommended: None recommended by OT Individuals Consulted Consulted and Agree with Results and Recommendations: Patient OT Goals Acute Rehab OT Goals OT Goal Formulation: With patient Time For Goal Achievement: 7 days ADL Goals Pt Will Perform Grooming: with modified independence;Standing at sink ADL Goal: Grooming - Progress: Goal set today Pt Will Perform Lower Body Bathing: with modified independence;Sitting, chair;Sit to stand in shower ADL Goal: Lower Body Bathing - Progress: Goal set today Pt Will Perform Lower Body Dressing: with modified independence;Sit to stand from chair (gathering own clothes with walker ) ADL Goal: Lower Body Dressing - Progress: Goal set today Pt Will Perform Tub/Shower Transfer: Shower transfer;with supervision;Shower seat with back ADL Goal: Web designer - Progress: Goal set today Additional ADL Goal #1: Pt will complete all aspects of toileting on comfort height commode with mod I. ADL Goal: Additional Goal #1 - Progress: Goal set today  OT Evaluation Precautions/Restrictions  Precautions Required Braces or Orthoses: No Restrictions Weight Bearing Restrictions: No Prior Functioning Home Living Lives With: Alone Receives Help From: Family;Other (Comment) (son and daughter in law live next door.) Type of Home: House Home Layout: One level Home Access: Stairs to enter Entrance Stairs-Rails: Right;Left;Can reach both ( Pt says he has "handles" on both sides to get up) Entrance Stairs-Number of Steps: 3 Bathroom Shower/Tub: Walk-in shower;Door [employer] Toilet: Handicapped height Bathroom Accessibility: Yes How Accessible: Accessible via walker Home Adaptive Equipment: Shower chair with back;Straight cane;Walker - four wheeled Prior Function Level of Independence: Independent with basic ADLs;Requires assistive device for independence;Needs assistance with homemaking Meal Prep: Minimal (daughter in  law brings him dinner  each night) Homemaking Assistance Comments: daughter in law assists with all cleaning. Able to Take Stairs?: Yes Driving: Yes Vocation: Retired ADL ADL Eating/Feeding: Performed;Independent Where Assessed - Eating/Feeding: Edge of bed Grooming: Performed;Wash/dry hands;Teeth care;Brushing hair;Set up Where Assessed - Grooming: Sitting, bed Upper Body Bathing: Simulated;Set up Where Assessed - Upper Body Bathing: Sitting, bed Lower Body Bathing: Simulated;Minimal assistance Lower Body Bathing Details (indicate cue type and reason): min assist to stand to do bottom.  Pt with mild balance deficits in standing. Where Assessed - Lower Body Bathing: Sit to stand from bed Upper Body Dressing: Set up;Performed Where Assessed - Upper Body Dressing: Sitting, bed Lower Body Dressing: Performed;Minimal assistance Lower Body Dressing Details (indicate cue type and reason): min assist only when standing.  Pt fatigues quickly in standing. Where Assessed - Lower Body Dressing: Sit to stand from chair Toilet Transfer: Performed;Minimal assistance Toilet Transfer Details (indicate cue type and reason): min assist to get started to pivot to [facility] Toilet Transfer Method: Stand pivot Toilet Transfer Equipment: Bedside commode Toileting - Clothing Manipulation: Performed;Minimal assistance Toileting - Clothing Manipulation Details (indicate cue type and reason): min assist to get underwear down.  Pt in a hurry secondary to diarrhea Where Assessed - Toileting Clothing Manipulation: Standing Toileting - Hygiene: Supervision/safety;Performed Where Assessed - Toileting Hygiene: Sit on 3-in-1 or toilet Tub/Shower Transfer: Not assessed Tub/Shower Transfer Method: Not assessed Equipment Used: Rolling walker Ambulation Related to ADLs: Pt unable to walk today.  Each time pt stands he has terrible diarrhea.  No ambulation attempted. ADL Comments: Pt does very well with adls.  Pt just  fatigured from constant diahrrhea and needed some steading assist only when on feet. Vision/Perception  Vision - History Baseline Vision: Bifocals Patient Visual Report: No change from baseline Vision - Assessment Eye Alignment: Within Functional Limits Vision Assessment: Vision not tested Cognition Cognition Arousal/Alertness: Awake/alert Overall Cognitive Status: Appears within functional limits for tasks assessed Cognition - Other Comments: Pt did state son recently took over doing finances etc and has power of attorney. Sensation/Coordination Sensation Light Touch: Appears Intact Coordination Gross Motor Movements are Fluid and Coordinated: Yes Fine Motor Movements are Fluid and Coordinated: Yes Extremity Assessment RUE Assessment RUE Assessment: Within Functional Limits LUE Assessment LUE Assessment: Within Functional Limits Mobility  Bed Mobility Bed Mobility: Yes Supine to Sit: 4: Min assist;HOB flat;With rails Supine to Sit Details (indicate cue type and reason): min assist to get fully into sitting. Scooting to [facility]: 7: Independent Transfers Transfers: Yes Sit to Stand: 4: Min assist Sit to Stand Details (indicate cue type and reason): S for balance only Stand to Sit: 5: Supervision;Without upper extremity assist;To toilet Exercises   End of Session OT - End of Session Activity Tolerance: Other (comment) (pt limited by constant diarrhea) Patient left: in bed;with call bell in reach Nurse Communication: Mobility status for transfers General Behavior During Session: [facility] for tasks performed Cognition: [facility] for tasks performed   Hope Budds [phone] 08/17/2011, 11:17 AM

## 2011-08-17 NOTE — Progress Notes (Signed)
Subjective: Patient continues to complain of diarrhea.  Still has poor appetite.  Objective: Vital signs in last 24 hours: Filed Vitals:   08/17/11 0200 08/17/11 0400 08/17/11 0521 08/17/11 1351  BP: 88/46 88/48 96/56  92/51  Pulse:   109 112  Temp:   98 F (36.7 C) 97.5 F (36.4 C)  TempSrc:   Oral Oral  Resp:   16 16  Weight:      SpO2:   94% 94%   Weight change:   Intake/Output Summary (Last 24 hours) at 08/17/11 1437 Last data filed at 08/17/11 1100  Gross per 24 hour  Intake   1156 ml  Output    352 ml  Net    804 ml    Physical Exam: General: Awake, Oriented, No acute distress. HEENT: EOMI. Neck: Supple CV: S1 and S2 Lungs: Clear to ascultation bilaterally Abdomen: Soft, Nontender, Nondistended, +bowel sounds. Ext: Good pulses. Trace edema.  Lab Results:  [facility] 08/17/11 0355 08/16/11 0636  NA 142 143  K 2.8* 3.3*  CL 113* 108  CO2 22 --  GLUCOSE 200* 168*  BUN 14 9  CREATININE 0.76 0.80  CALCIUM 7.8* --  MG 2.0 --  PHOS -- --    Basename 08/17/11 0355  AST 12  ALT 5  ALKPHOS 42  BILITOT 0.5  PROT 5.5*  ALBUMIN 2.3*   No results found for this basename: LIPASE:2,AMYLASE:2 in the last 72 hours  Basename 08/16/11 0636 08/16/11 0620  WBC -- 14.0*  NEUTROABS -- 11.6*  HGB 11.6* 11.7*  HCT 34.0* 34.9*  MCV -- 94.8  PLT -- 78*   No results found for this basename: CKTOTAL:3,CKMB:3,CKMBINDEX:3,TROPONINI:3 in the last 72 hours No components found with this basename: POCBNP:3 No results found for this basename: DDIMER:2 in the last 72 hours No results found for this basename: HGBA1C:2 in the last 72 hours No results found for this basename: CHOL:2,HDL:2,LDLCALC:2,TRIG:2,CHOLHDL:2,LDLDIRECT:2 in the last 72 hours No results found for this basename: TSH,T4TOTAL,FREET3,T3FREE,THYROIDAB in the last 72 hours  Basename 08/17/11 0355  VITAMINB12 431  FOLATE 19.5  FERRITIN 196  TIBC 155*  IRON 10*  RETICCTPCT 1.5    Micro Results: Recent  Results (from the past 240 hour(s))  CULTURE, BLOOD (ROUTINE X 2)     Status: Normal (Preliminary result)   Collection Time   08/16/11  6:20 AM      Component Value Range Status Comment   Specimen Description BLOOD LEFT ANTECUBITAL   Final    Special Requests BOTTLES DRAWN AEROBIC AND ANAEROBIC   Final    Culture  Setup Time [id number]   Final    Culture     Final    Value:        BLOOD CULTURE RECEIVED NO GROWTH TO DATE CULTURE WILL BE HELD FOR 5 DAYS BEFORE ISSUING A FINAL NEGATIVE REPORT   Report Status PENDING   Incomplete   URINE CULTURE     Status: Normal   Collection Time   08/16/11  6:28 AM      Component Value Range Status Comment   Specimen Description URINE, CLEAN CATCH   Final    Special Requests NONE   Final    Culture  Setup Time [id number]   Final    Colony Count 60,000 COLONIES/ML   Final    Culture     Final    Value: Multiple bacterial morphotypes present, none predominant. Suggest appropriate recollection if clinically indicated.   Report Status 08/17/2011 FINAL  Final   CULTURE, BLOOD (ROUTINE X 2)     Status: Normal (Preliminary result)   Collection Time   08/16/11  6:30 AM      Component Value Range Status Comment   Specimen Description BLOOD RIGHT ANTECUBITAL   Final    Special Requests BOTTLES DRAWN AEROBIC AND ANAEROBIC   Final    Culture  Setup Time [id number]   Final    Culture     Final    Value:        BLOOD CULTURE RECEIVED NO GROWTH TO DATE CULTURE WILL BE HELD FOR 5 DAYS BEFORE ISSUING A FINAL NEGATIVE REPORT   Report Status PENDING   Incomplete   STOOL CULTURE     Status: Normal (Preliminary result)   Collection Time   08/16/11  8:27 AM      Component Value Range Status Comment   Specimen Description STOOL   Final    Special Requests NONE   Final    Culture NO SUSPICIOUS COLONIES, CONTINUING TO HOLD   Final    Report Status PENDING   Incomplete   CLOSTRIDIUM DIFFICILE BY PCR     Status: Abnormal   Collection Time   08/16/11  8:27 AM       Component Value Range Status Comment   C difficile by pcr POSITIVE (*) NEGATIVE  Final     Studies/Results: Dg Abd Acute W/chest  08/16/2011  *RADIOLOGY REPORT*  Clinical Data: Mid abdominal pain, diarrhea, history hypertension, diabetes, coronary disease post MI, CHF  ACUTE ABDOMEN SERIES (ABDOMEN 2 VIEW & CHEST 1 VIEW)  Comparison: Chest radiograph 06/25/2011  Findings: Enlargement of cardiac silhouette post CABG. Pulmonary vascular congestion. Left basilar atelectasis without infiltrate or effusion. Skin fold projects over left lung. Surgical clips right upper quadrant. Few nonspecific minimally prominent small bowel loops in abdomen. No definite evidence of bowel obstruction or bowel wall thickening. No free intraperitoneal air. Osseous demineralization with degenerative disc disease changes lumbar spine.  IMPRESSION: No definite acute abdominal findings. Enlargement of cardiac silhouette post CABG. Left basilar atelectasis.  Original Report Authenticated By: Lollie Marrow, M.D.    Medications: I have reviewed the patient's current medications. Scheduled Meds:   . aspirin EC  81 mg Oral BH-q7a  . cholecalciferol  2,000 Units Oral q morning - 10a  . ezetimibe  10 mg Oral QHS  . feeding supplement  237 mL Oral TID WC  . finasteride  5 mg Oral q morning - 10a  . gemfibrozil  600 mg Oral BID AC  . glyBURIDE  5 mg Oral Q breakfast  . insulin aspart  0-9 Units Subcutaneous TID WC  . levothyroxine  25 mcg Oral Daily  . mirtazapine  7.5 mg Oral QHS  . omega-3 acid ethyl esters  1 g Oral BH-q7a  . potassium chloride  40 mEq Oral Once  . saccharomyces boulardii  250 mg Oral BID  . sodium chloride  500 mL Intravenous Once  . Tamsulosin HCl  0.4 mg Oral BH-q7a  . vancomycin  125 mg Oral QID   Followed by  . vancomycin  125 mg Oral BID   Followed by  . vancomycin  125 mg Oral Daily   Followed by  . vancomycin  125 mg Oral QODAY   Followed by  . vancomycin  125 mg Oral Q3 days  . warfarin   3 mg Oral ONCE-1800  . warfarin  4 mg Oral Once  . Warfarin - Pharmacist Dosing Inpatient  Does not apply q1800  . DISCONTD: feeding supplement  237 mL Oral BID BM  . DISCONTD: warfarin  3 mg Oral QPM   Continuous Infusions:   . sodium chloride 75 mL/hr at 08/17/11 0552   PRN Meds:.acetaminophen, acetaminophen, LORazepam, ondansetron (ZOFRAN) IV, ondansetron  Assessment/Plan: 1. 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  C. difficile PCR positive on 08/16/2011.  Continue vancomycin taper started on 08/16/2011.  Continue Florastar.   2. Dehydration. Likely diarrhea.  Continue to hold furosemide.  Continue gentle hydration.     3. History of coronary artery disease. Stable. Continue aspirin.   4. Paroxysmal a-fib. Rate controlled. Continue anticoagulate on Coumadin.   5. DM (diabetes mellitus), type 2, uncontrolled with complications.  Continue sensitive sliding scale insulin.  If blood sugars consistently in the 200s may need to start Lantus.  Continue home dose of glyburide.   6. Hypertension.  Borderline low however has normal mentation.  Continue to monitor.     7. Hyperlipidemia. Continue Gemfibrozil.   8. Hypothyroidism. Continue levothyroxine.   9. Weakness generalized. Likely due to diarrhea and dehydration.  Physical therapy evaluation pending.  Occupational therapy recommending home health at this time.    10. Hypokalemia. Replace as needed, likely due to diarrhea.   11. Leukocytosis. Likely due to diarrhea. Monitor.   12. Anemia.  Likely due to iron deficiency anemia.  Start the patient on supplemental iron.     13. Prophylaxis. Continue anticoagulation on Coumadin.   14. CODE STATUS. DO NOT RESUSCITATE/DO NOT INTUBATE.  15.  Disposition.  Pending.    LOS: 1 day  Inis Borneman A, MD 08/17/2011, 2:37 PM

## 2011-08-17 NOTE — Progress Notes (Signed)
CSW met with patient. Patient is alert and oriented. Patient states that he prefers to get strong enough in the hospital so that he can just go straight home but that if he has to go to rehab he prefers to go back to [facility].  David Davidson C. David Davidson MSW, LCSW [phone]

## 2011-08-17 NOTE — Progress Notes (Signed)
UR completed 

## 2011-08-17 NOTE — Progress Notes (Signed)
INITIAL ADULT NUTRITION ASSESSMENT Date: 08/17/2011   Time: 4:33 PM  Reason for Assessment: Poor PO intake  ASSESSMENT: Male 78 y.o.  Dx: C. difficile colitis  Hx:  Past Medical History  Diagnosis Date  . Myocardial infarct   . Diabetes mellitus   . Hypertension   . CHF (congestive heart failure)   . Coronary artery disease   . Dysrhythmia   . Clostridium difficile diarrhea   . Cancer     Bladder cancer  . Hyperlipidemia     Related Meds:     . aspirin EC  81 mg Oral BH-q7a  . cholecalciferol  2,000 Units Oral q morning - 10a  . ezetimibe  10 mg Oral QHS  . feeding supplement  237 mL Oral TID WC  . finasteride  5 mg Oral q morning - 10a  . gemfibrozil  600 mg Oral BID AC  . glyBURIDE  5 mg Oral Q breakfast  . insulin aspart  0-9 Units Subcutaneous TID WC  . iron polysaccharides  150 mg Oral Daily  . levothyroxine  25 mcg Oral Daily  . mirtazapine  7.5 mg Oral QHS  . omega-3 acid ethyl esters  1 g Oral BH-q7a  . potassium chloride  20 mEq Oral Once  . potassium chloride  40 mEq Oral Once  . saccharomyces boulardii  250 mg Oral BID  . sodium chloride  500 mL Intravenous Once  . Tamsulosin HCl  0.4 mg Oral BH-q7a  . vancomycin  125 mg Oral QID   Followed by  . vancomycin  125 mg Oral BID   Followed by  . vancomycin  125 mg Oral Daily   Followed by  . vancomycin  125 mg Oral QODAY   Followed by  . vancomycin  125 mg Oral Q3 days  . warfarin  3 mg Oral ONCE-1800  . warfarin  4 mg Oral Once  . Warfarin - Pharmacist Dosing Inpatient   Does not apply q1800  . DISCONTD: feeding supplement  237 mL Oral BID BM  . DISCONTD: warfarin  3 mg Oral QPM     Ht: 6\' 1"  (185.4 cm)  Wt: 185 lb 6.5 oz (84.1 kg)  Ideal Wt:  83.6 kg % Ideal Wt: 98.6%  Usual Wt: 210 lb per patient report % Usual Wt: 86%  Body mass index is 24.46 kg/(m^2).  Food/Nutrition Related Hx: Prior to admission, patient was experiencing > 10 episodes loose BM. Pt is receiving the probiotic  Florastar. Patient presented with dehydration, hypokalemia, anemia, and generalized weakness all likely related to chronic diarrhea. Is currently being repleted with potassium.  Three loose BMs documented since admission, and 0% PO intake is documented in doc flowsheets. Has not received Glucerna supplement per RN. Patient has Stage III pressure ulcer on left buttock.  Patient stated weight loss of 30 lbs began in October 2012. He reported three hospitalizations from then until current, with an overall decreased appetite. He stated tolerating dry cereal and milk, but unable to consume most other foods, as they exacerbate his GI issues.   Has been seen by several RDs in past for diarrhea, and unintentional weight loss. Was also evaluated by SLP in previous hospitalizations, who had recommend regular with thin liquids.  Patient's loss of 16.5% of usual body weight in six months, evident mild wasting in face and neck region, and presence of Stage III pressure ulcer meets criteria for moderate malnutrition in context of chronic illness  Labs:  CMP  Component Value Date/Time   NA 142 08/17/2011 0355   K 2.8* 08/17/2011 0355   CL 113* 08/17/2011 0355   CO2 22 08/17/2011 0355   GLUCOSE 200* 08/17/2011 0355   BUN 14 08/17/2011 0355   CREATININE 0.76 08/17/2011 0355   CALCIUM 7.8* 08/17/2011 0355   PROT 5.5* 08/17/2011 0355   ALBUMIN 2.3* 08/17/2011 0355   AST 12 08/17/2011 0355   ALT 5 08/17/2011 0355   ALKPHOS 42 08/17/2011 0355   BILITOT 0.5 08/17/2011 0355   GFRNONAA 74* 08/17/2011 0355   GFRAA 85* 08/17/2011 0355  Magnesium 2.0 WNL (3/07) Presence of bacteria, leukocytes and ketones in urine (3/07)  CBG (last 3)   Basename 08/16/11 2130 08/16/11 1742 08/16/11 1338  GLUCAP 216* 225* 177*    Intake:   Intake/Output Summary (Last 24 hours) at 08/17/11 1633 Last data filed at 08/17/11 1500  Gross per 24 hour  Intake   1156 ml  Output    354 ml  Net    802 ml     Diet Order: Carb Control1600-2000  kcal  Supplements/Tube Feeding: Glucerna TID with meals  IVF:     sodium chloride Last Rate: 75 mL/hr at 08/17/11 0552  sodium chloride     Patient reported consuming Glucerna in the past, so will likely be compliant to supplementation once diarrhea improves. Unable to finish conversation due to patient having C.diff episode.  Estimated Nutritional Needs:   Kcal: 1800 - 2000 Protein: 105 -115 gm Fluid: > 1.8 L  NUTRITION DIAGNOSIS: -Inadequate oral intake (NI-2.1).  Status: Ongoing  RELATED TO: GI-related complications including diarrhea and upset stomach  AS EVIDENCE BY: < 25% meals consumed  MONITORING/EVALUATION(Goals): Goal: Consume >/= 90% of estimated needs Monitor: PO intake, supplement tolerance, weights, labs  EDUCATION NEEDS: -No education needs identified at this time  INTERVENTION:  Nursing to continue to encourage meal and supplement intake.   RD to add dry cereal as snack in [employer]  RD to follow nutrition care plan  Dietitian #[phone]  DOCUMENTATION CODES Per approved criteria  -Non-severe (moderate) malnutrition in the context of chronic illness    Lloyd Huger 08/17/2011, 4:33 PM  Marshall Cork Pager: [phone]

## 2011-08-18 LAB — BASIC METABOLIC PANEL
CO2: 23 mEq/L (ref 19–32)
Calcium: 8.1 mg/dL — ABNORMAL LOW (ref 8.4–10.5)
Chloride: 113 mEq/L — ABNORMAL HIGH (ref 96–112)
Potassium: 3 mEq/L — ABNORMAL LOW (ref 3.5–5.1)
Sodium: 141 mEq/L (ref 135–145)

## 2011-08-18 LAB — GLUCOSE, CAPILLARY
Glucose-Capillary: 159 mg/dL — ABNORMAL HIGH (ref 70–99)
Glucose-Capillary: 171 mg/dL — ABNORMAL HIGH (ref 70–99)
Glucose-Capillary: 98 mg/dL (ref 70–99)
Glucose-Capillary: 133 mg/dL — ABNORMAL HIGH (ref 70–99)

## 2011-08-18 LAB — PROTIME-INR
INR: 2.02 — ABNORMAL HIGH (ref 0.00–1.49)
Prothrombin Time: 23.2 seconds — ABNORMAL HIGH (ref 11.6–15.2)

## 2011-08-18 LAB — CBC
Platelets: 68 10*3/uL — ABNORMAL LOW (ref 150–400)
RBC: 3.65 MIL/uL — ABNORMAL LOW (ref 4.22–5.81)
WBC: 10.1 10*3/uL (ref 4.0–10.5)

## 2011-08-18 MED ORDER — WARFARIN SODIUM 3 MG PO TABS
3.0000 mg | ORAL_TABLET | Freq: Once | ORAL | Status: AC
  Administered 2011-08-18: 3 mg via ORAL
  Filled 2011-08-18: qty 1

## 2011-08-18 MED ORDER — POTASSIUM CHLORIDE CRYS ER 20 MEQ PO TBCR
20.0000 meq | EXTENDED_RELEASE_TABLET | Freq: Once | ORAL | Status: AC
  Administered 2011-08-18: 20 meq via ORAL
  Filled 2011-08-18: qty 1

## 2011-08-18 MED ORDER — POTASSIUM CHLORIDE CRYS ER 20 MEQ PO TBCR
40.0000 meq | EXTENDED_RELEASE_TABLET | Freq: Once | ORAL | Status: AC
  Administered 2011-08-18: 40 meq via ORAL
  Filled 2011-08-18: qty 2

## 2011-08-18 NOTE — Progress Notes (Signed)
Subjective: Continues to have diarrhea.  Reports appetite is improved.  Objective: Vital signs in last 24 hours: Filed Vitals:   08/17/11 1611 08/17/11 2138 08/18/11 0612 08/18/11 1346  BP:  91/58 104/71 109/70  Pulse:  118 123 113  Temp:  98.2 F (36.8 C) 97.5 F (36.4 C) 97.6 F (36.4 C)  TempSrc:  Oral Oral Oral  Resp:  18 16 16   Height: 6\' 1"  (1.854 m)     Weight: 84.1 kg (185 lb 6.5 oz)     SpO2:  95% 96% 97%   Weight change: 1.7 kg (3 lb 12 oz)  Intake/Output Summary (Last 24 hours) at 08/18/11 1626 Last data filed at 08/18/11 1155  Gross per 24 hour  Intake 1891.67 ml  Output      7 ml  Net 1884.67 ml    Physical Exam: General: Awake, Oriented, No acute distress. HEENT: EOMI. Neck: Supple CV: S1 and S2 Lungs: Clear to ascultation bilaterally Abdomen: Soft, Nontender, Nondistended, +bowel sounds. Ext: Good pulses. Trace edema.  Lab Results:  [facility] 08/18/11 0430 08/17/11 0355  NA 141 142  K 3.0* 2.8*  CL 113* 113*  CO2 23 22  GLUCOSE 152* 200*  BUN 20 14  CREATININE 0.71 0.76  CALCIUM 8.1* 7.8*  MG -- 2.0  PHOS -- --    Basename 08/17/11 0355  AST 12  ALT 5  ALKPHOS 42  BILITOT 0.5  PROT 5.5*  ALBUMIN 2.3*   No results found for this basename: LIPASE:2,AMYLASE:2 in the last 72 hours  Basename 08/18/11 0430 08/16/11 0636 08/16/11 0620  WBC 10.1 -- 14.0*  NEUTROABS -- -- 11.6*  HGB 11.5* 11.6* --  HCT 34.6* 34.0* --  MCV 94.8 -- 94.8  PLT 68* -- 78*   No results found for this basename: CKTOTAL:3,CKMB:3,CKMBINDEX:3,TROPONINI:3 in the last 72 hours No components found with this basename: POCBNP:3 No results found for this basename: DDIMER:2 in the last 72 hours No results found for this basename: HGBA1C:2 in the last 72 hours No results found for this basename: CHOL:2,HDL:2,LDLCALC:2,TRIG:2,CHOLHDL:2,LDLDIRECT:2 in the last 72 hours No results found for this basename: TSH,T4TOTAL,FREET3,T3FREE,THYROIDAB in the last 72 hours  Basename  08/17/11 0355  VITAMINB12 431  FOLATE 19.5  FERRITIN 196  TIBC 155*  IRON 10*  RETICCTPCT 1.5    Micro Results: Recent Results (from the past 240 hour(s))  CULTURE, BLOOD (ROUTINE X 2)     Status: Normal (Preliminary result)   Collection Time   08/16/11  6:20 AM      Component Value Range Status Comment   Specimen Description BLOOD LEFT ANTECUBITAL   Final    Special Requests BOTTLES DRAWN AEROBIC AND ANAEROBIC   Final    Culture  Setup Time [id number]   Final    Culture     Final    Value:        BLOOD CULTURE RECEIVED NO GROWTH TO DATE CULTURE WILL BE HELD FOR 5 DAYS BEFORE ISSUING A FINAL NEGATIVE REPORT   Report Status PENDING   Incomplete   URINE CULTURE     Status: Normal   Collection Time   08/16/11  6:28 AM      Component Value Range Status Comment   Specimen Description URINE, CLEAN CATCH   Final    Special Requests NONE   Final    Culture  Setup Time [id number]   Final    Colony Count 60,000 COLONIES/ML   Final    Culture  Final    Value: Multiple bacterial morphotypes present, none predominant. Suggest appropriate recollection if clinically indicated.   Report Status 08/17/2011 FINAL   Final   CULTURE, BLOOD (ROUTINE X 2)     Status: Normal (Preliminary result)   Collection Time   08/16/11  6:30 AM      Component Value Range Status Comment   Specimen Description BLOOD RIGHT ANTECUBITAL   Final    Special Requests BOTTLES DRAWN AEROBIC AND ANAEROBIC   Final    Culture  Setup Time [id number]   Final    Culture     Final    Value:        BLOOD CULTURE RECEIVED NO GROWTH TO DATE CULTURE WILL BE HELD FOR 5 DAYS BEFORE ISSUING A FINAL NEGATIVE REPORT   Report Status PENDING   Incomplete   STOOL CULTURE     Status: Normal (Preliminary result)   Collection Time   08/16/11  8:27 AM      Component Value Range Status Comment   Specimen Description STOOL   Final    Special Requests NONE   Final    Culture NO SUSPICIOUS COLONIES, CONTINUING TO HOLD   Final     Report Status PENDING   Incomplete   CLOSTRIDIUM DIFFICILE BY PCR     Status: Abnormal   Collection Time   08/16/11  8:27 AM      Component Value Range Status Comment   C difficile by pcr POSITIVE (*) NEGATIVE  Final     Studies/Results: No results found.  Medications: I have reviewed the patient's current medications. Scheduled Meds:    . aspirin EC  81 mg Oral BH-q7a  . cholecalciferol  2,000 Units Oral q morning - 10a  . ezetimibe  10 mg Oral QHS  . feeding supplement  237 mL Oral TID WC  . finasteride  5 mg Oral q morning - 10a  . gemfibrozil  600 mg Oral BID AC  . glyBURIDE  5 mg Oral Q breakfast  . insulin aspart  0-9 Units Subcutaneous TID WC  . iron polysaccharides  150 mg Oral Daily  . levothyroxine  25 mcg Oral Daily  . mirtazapine  7.5 mg Oral QHS  . omega-3 acid ethyl esters  1 g Oral BH-q7a  . potassium chloride  20 mEq Oral Once  . potassium chloride  20 mEq Oral Once  . potassium chloride  40 mEq Oral Once  . saccharomyces boulardii  250 mg Oral BID  . Tamsulosin HCl  0.4 mg Oral BH-q7a  . vancomycin  125 mg Oral QID   Followed by  . vancomycin  125 mg Oral BID   Followed by  . vancomycin  125 mg Oral Daily   Followed by  . vancomycin  125 mg Oral QODAY   Followed by  . vancomycin  125 mg Oral Q3 days  . warfarin  3 mg Oral ONCE-1800  . warfarin  3 mg Oral ONCE-1800  . Warfarin - Pharmacist Dosing Inpatient   Does not apply q1800   Continuous Infusions:    . sodium chloride 50 mL/hr at 08/18/11 1403   PRN Meds:.acetaminophen, acetaminophen, LORazepam, ondansetron (ZOFRAN) IV, ondansetron  Assessment/Plan: 1. 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  C. difficile PCR positive on 08/16/2011.  Continue vancomycin taper started on 08/16/2011.  Continue Florastar.   2. Dehydration. Likely diarrhea.  Continue to hold furosemide.  Continue gentle hydration.     3. History of coronary artery  disease. Stable. Continue aspirin.   4. Paroxysmal  a-fib.  Stable. Continue anticoagulate on Coumadin.   5. DM (diabetes mellitus), type 2, uncontrolled with complications.  Continue sensitive sliding scale insulin.  Continue home dose of glyburide.   6. Hypertension.  Borderline low however has normal mentation.  Continue to monitor.     7. Hyperlipidemia. Continue Gemfibrozil.   8. Hypothyroidism. Continue levothyroxine.   9. Weakness generalized. Likely due to diarrhea and dehydration.  Physical therapy at this time recommending home health PT.  Occupational therapy recommending home health at this time.    10. Hypokalemia. Replace as needed, likely due to diarrhea.   11. Leukocytosis. Likely due to diarrhea. Monitor.   12. Anemia.  Likely due to iron deficiency anemia.  Start the patient on supplemental iron.     13. Prophylaxis. Continue anticoagulation on Coumadin.   14. CODE STATUS. DO NOT RESUSCITATE/DO NOT INTUBATE.  15.  Disposition.  Pending, improvement in diarrhea.    LOS: 2 days  Caston Coopersmith A, MD 08/18/2011, 4:26 PM

## 2011-08-18 NOTE — Evaluation (Signed)
Physical Therapy Evaluation Patient Details Name: David Davidson MRN: [id number] DOB: [birth year] Today's Date: 08/18/2011  Problem List:  Patient Active Problem List  Diagnoses  . CAD (coronary artery disease)  . Paroxysmal a-fib  . DM (diabetes mellitus), type 2, uncontrolled with complications  . Hypertension  . S/P CABG (coronary artery bypass graft)  . Hyperlipidemia  . Hypothyroidism  . Diarrhea  . Weakness generalized  . Dehydration  . C. difficile colitis  . Hypokalemia  . Leukocytosis  . Anemia    Past Medical History:  Past Medical History  Diagnosis Date  . Myocardial infarct   . Diabetes mellitus   . Hypertension   . CHF (congestive heart failure)   . Coronary artery disease   . Dysrhythmia   . Clostridium difficile diarrhea   . Cancer     Bladder cancer  . Hyperlipidemia    Past Surgical History:  Past Surgical History  Procedure Date  . Coronary artery bypass graft   . Cholecystectomy   . Cardiac catheterization 10/10/2001  . Cystoscopy, turbt 05/09/2006, 02/19/2006, 04/19/2005, 04/21/2003    PT Assessment/Plan/Recommendation PT Assessment Clinical Impression Statement: 78 yo previously active male with deconditioning from illness.  He will benefit from acute PT and hopefully D/C to home with family assist and HHPT as he recovers. PT Recommendation/Assessment: Patient will need skilled PT in the acute care venue PT Problem List: Decreased strength;Decreased activity tolerance;Decreased mobility PT Therapy Diagnosis : Difficulty walking;Generalized weakness PT Plan PT Frequency: Min 3X/week PT Treatment/Interventions: DME instruction;Gait training;Functional mobility training;Therapeutic activities;Therapeutic exercise;Balance training;Patient/family education PT Recommendation Follow Up Recommendations: Home health PT Equipment Recommended: None recommended by PT PT Goals  Acute Rehab PT Goals PT Goal Formulation: With patient Time For Goal  Achievement: 2 weeks Pt will Roll Supine to Right Side: Independently PT Goal: Rolling Supine to Right Side - Progress: Goal set today Pt will Roll Supine to Left Side: Independently PT Goal: Rolling Supine to Left Side - Progress: Goal set today Pt will go Supine/Side to Sit: Independently PT Goal: Supine/Side to Sit - Progress: Goal set today Pt will go Sit to Supine/Side: Independently PT Goal: Sit to Supine/Side - Progress: Goal set today Pt will go Sit to Stand: with modified independence PT Goal: Sit to Stand - Progress: Goal set today Pt will go Stand to Sit: with modified independence PT Goal: Stand to Sit - Progress: Goal set today Pt will Ambulate: 51 - 150 feet PT Goal: Ambulate - Progress: Goal set today Pt will Perform Home Exercise Program: with supervision, verbal cues required/provided PT Goal: Perform Home Exercise Program - Progress: Goal set today  PT Evaluation Precautions/Restrictions  Precautions Precautions: Other (comment) (Joanny Dupree contact) Required Braces or Orthoses: No Restrictions Weight Bearing Restrictions: No Prior Functioning  Home Living Lives With: Alone Receives Help From: Family;Other (Comment) (son and daughter in law live next door.) Type of Home: House Home Layout: One level Home Access: Stairs to enter Entrance Stairs-Rails: Right;Left;Can reach both ( Pt says he has "handles" on both sides to get up) Entrance Stairs-Number of Steps: 3 Bathroom Shower/Tub: Walk-in shower;Door [employer] Toilet: Handicapped height Bathroom Accessibility: Yes How Accessible: Accessible via walker Home Adaptive Equipment: Shower chair with back;Straight cane;Walker - four wheeled Prior Function Level of Independence: Independent with basic ADLs;Requires assistive device for independence;Needs assistance with homemaking Meal Prep: Minimal (daughter in law brings him dinner each night) Homemaking Assistance Comments: daughter in law assists with all  cleaning. Able to Take Stairs?: Yes Driving: Yes  Vocation: Retired Producer, television/film/video: Awake/alert Overall Cognitive Status: Appears within functional limits for tasks assessed Sensation/Coordination Sensation Light Touch: Appears Intact Coordination Gross Motor Movements are Fluid and Coordinated: Yes Extremity Assessment RLE Assessment RLE Assessment: Within Functional Limits LLE Assessment LLE Assessment: Within Functional Limits Mobility (including Balance) Bed Mobility Bed Mobility: Yes Rolling Right: 5: Supervision;With rail Rolling Left: 5: Supervision;With rail Supine to Sit: 4: Min assist;HOB flat;With rails Supine to Sit Details (indicate cue type and reason): pt needs some assist because he is 'so weak' Transfers Transfers: Yes Sit to Stand: 4: Min assist;From elevated surface;With upper extremity assist;From bed Sit to Stand Details (indicate cue type and reason): pt repeated activity 5 times for strengthening.  needed rest periods between repetitions. Stand to Sit: 5: Supervision;With upper extremity assist;To bed Stand to Sit Details: verbal cues to reach back with arms to control descent Ambulation/Gait Ambulation/Gait: No (pt unable because of weakness) Stairs: No Wheelchair Mobility Wheelchair Mobility: No  Posture/Postural Control Posture/Postural Control: No significant limitations Balance Balance Assessed: No Exercise  General Exercises - Lower Extremity Ankle Circles/Pumps: AROM;Both;5 reps;Supine Quad Sets: AROM;Both;5 reps;Supine Hip Flexion/Marching: AROM;5 reps;Both;Standing;Other (comment) (RW for balance) End of Session PT - End of Session Activity Tolerance: Patient limited by fatigue Patient left: in bed;Other (comment) (postitioned in right sidelying) Nurse Communication: Mobility status for transfers General Behavior During Session: [facility] for tasks performed Cognition: [facility] for tasks performed  Donnetta Hail 08/18/2011, 10:18 AM

## 2011-08-18 NOTE — Progress Notes (Signed)
ANTICOAGULATION CONSULT NOTE - Follow Up Consult  Pharmacy Consult for Warfarin Indication: atrial fibrillation  Allergies  Allergen Reactions  . Morphine And Related Other (See Comments)    Reaction unknown   Labs:  Basename 08/18/11 0430 08/17/11 0355 08/16/11 1815 08/16/11 0636 08/16/11 0620  HGB 11.5* -- -- 11.6* --  HCT 34.6* -- -- 34.0* 34.9*  PLT 68* -- -- -- 78*  APTT -- -- -- -- --  LABPROT 23.2* 21.5* 20.7* -- --  INR 2.02* 1.83* 1.74* -- --  HEPARINUNFRC -- -- -- -- --  CREATININE 0.71 0.76 -- 0.80 --  CKTOTAL -- -- -- -- --  CKMB -- -- -- -- --  TROPONINI -- -- -- -- --   Estimated Creatinine Clearance: 58.3 ml/min (by C-G formula based on Cr of 0.71).   Medications:  Scheduled:    . aspirin EC  81 mg Oral BH-q7a  . cholecalciferol  2,000 Units Oral q morning - 10a  . ezetimibe  10 mg Oral QHS  . feeding supplement  237 mL Oral TID WC  . finasteride  5 mg Oral q morning - 10a  . gemfibrozil  600 mg Oral BID AC  . glyBURIDE  5 mg Oral Q breakfast  . insulin aspart  0-9 Units Subcutaneous TID WC  . iron polysaccharides  150 mg Oral Daily  . levothyroxine  25 mcg Oral Daily  . mirtazapine  7.5 mg Oral QHS  . omega-3 acid ethyl esters  1 g Oral BH-q7a  . potassium chloride  20 mEq Oral Once  . potassium chloride  20 mEq Oral Once  . potassium chloride  40 mEq Oral Once  . saccharomyces boulardii  250 mg Oral BID  . Tamsulosin HCl  0.4 mg Oral BH-q7a  . vancomycin  125 mg Oral QID   Followed by  . vancomycin  125 mg Oral BID   Followed by  . vancomycin  125 mg Oral Daily   Followed by  . vancomycin  125 mg Oral QODAY   Followed by  . vancomycin  125 mg Oral Q3 days  . warfarin  3 mg Oral ONCE-1800  . warfarin  3 mg Oral ONCE-1800  . Warfarin - Pharmacist Dosing Inpatient   Does not apply q1800  . DISCONTD: feeding supplement  237 mL Oral BID BM  . DISCONTD: warfarin  3 mg Oral QPM    Assessment:  78 yo male on chronic coumadin for AFib; home  dose 3mg  daily.  Cdiff colitis on Vancomycin taper, continues with diarrhea.  Po intake: improved, on Glucerna shakes tid.  INR low therapeutic range this am.  Goal of Therapy:  INR 2-3   Plan:   3mg  Coumadin today  Continue daily protimes  Encourage po intake.  Otho Bellows PjarmD Pager [phone] 08/18/2011,8:49 AM

## 2011-08-19 LAB — STOOL CULTURE

## 2011-08-19 LAB — BASIC METABOLIC PANEL WITH GFR
BUN: 19 mg/dL (ref 6–23)
CO2: 23 meq/L (ref 19–32)
Calcium: 8 mg/dL — ABNORMAL LOW (ref 8.4–10.5)
Chloride: 115 meq/L — ABNORMAL HIGH (ref 96–112)
Creatinine, Ser: 0.59 mg/dL (ref 0.50–1.35)
GFR calc Af Amer: >90 mL/min
GFR calc non Af Amer: 82 mL/min — ABNORMAL LOW
Glucose, Bld: 120 mg/dL — ABNORMAL HIGH (ref 70–99)
Potassium: 3.6 meq/L (ref 3.5–5.1)
Sodium: 143 meq/L (ref 135–145)

## 2011-08-19 LAB — CBC
HCT: 34.7 % — ABNORMAL LOW (ref 39.0–52.0)
MCV: 94.8 fL (ref 78.0–100.0)
RDW: 16.3 % — ABNORMAL HIGH (ref 11.5–15.5)
WBC: 7 10*3/uL (ref 4.0–10.5)

## 2011-08-19 LAB — GLUCOSE, CAPILLARY
Glucose-Capillary: 112 mg/dL — ABNORMAL HIGH (ref 70–99)
Glucose-Capillary: 190 mg/dL — ABNORMAL HIGH (ref 70–99)
Glucose-Capillary: 92 mg/dL (ref 70–99)

## 2011-08-19 LAB — PROTIME-INR
INR: 2.11 — ABNORMAL HIGH (ref 0.00–1.49)
Prothrombin Time: 24 s — ABNORMAL HIGH (ref 11.6–15.2)

## 2011-08-19 MED ORDER — WARFARIN SODIUM 3 MG PO TABS
3.0000 mg | ORAL_TABLET | Freq: Once | ORAL | Status: AC
  Administered 2011-08-19: 3 mg via ORAL
  Filled 2011-08-19: qty 1

## 2011-08-19 NOTE — Progress Notes (Addendum)
Subjective: Continues to have watery diarrhea, but eating well.  Objective: Vital signs in last 24 hours: Filed Vitals:   08/18/11 0612 08/18/11 1346 08/18/11 2130 08/19/11 0508  BP: 104/71 109/70 96/64 95/62   Pulse: 123 113 114 109  Temp: 97.5 F (36.4 C) 97.6 F (36.4 C) 98.1 F (36.7 C) 97.4 F (36.3 C)  TempSrc: Oral Oral Oral Oral  Resp: 16 16 16 16   Height:      Weight:    86.6 kg (190 lb 14.7 oz)  SpO2: 96% 97% 97% 97%   Weight change: 2.5 kg (5 lb 8.2 oz)  Intake/Output Summary (Last 24 hours) at 08/19/11 1502 Last data filed at 08/19/11 0509  Gross per 24 hour  Intake   2402 ml  Output    200 ml  Net   2202 ml    Physical Exam: General: Awake, Oriented, No acute distress. HEENT: EOMI. Neck: Supple CV: S1 and S2 Lungs: Clear to ascultation bilaterally Abdomen: Soft, Nontender, Nondistended, +bowel sounds. Ext: Good pulses. Trace edema.  Lab Results:  Basename 08/19/11 0414 08/18/11 0430 08/17/11 0355  NA 143 141 --  K 3.6 3.0* --  CL 115* 113* --  CO2 23 23 --  GLUCOSE 120* 152* --  BUN 19 20 --  CREATININE 0.59 0.71 --  CALCIUM 8.0* 8.1* --  MG -- -- 2.0  PHOS -- -- --    Basename 08/17/11 0355  AST 12  ALT 5  ALKPHOS 42  BILITOT 0.5  PROT 5.5*  ALBUMIN 2.3*   No results found for this basename: LIPASE:2,AMYLASE:2 in the last 72 hours  Basename 08/19/11 0414 08/18/11 0430  WBC 7.0 10.1  NEUTROABS -- --  HGB 11.5* 11.5*  HCT 34.7* 34.6*  MCV 94.8 94.8  PLT 82* 68*   No results found for this basename: CKTOTAL:3,CKMB:3,CKMBINDEX:3,TROPONINI:3 in the last 72 hours No components found with this basename: POCBNP:3 No results found for this basename: DDIMER:2 in the last 72 hours No results found for this basename: HGBA1C:2 in the last 72 hours No results found for this basename: CHOL:2,HDL:2,LDLCALC:2,TRIG:2,CHOLHDL:2,LDLDIRECT:2 in the last 72 hours No results found for this basename: TSH,T4TOTAL,FREET3,T3FREE,THYROIDAB in the last 72  hours  Basename 08/17/11 0355  VITAMINB12 431  FOLATE 19.5  FERRITIN 196  TIBC 155*  IRON 10*  RETICCTPCT 1.5    Micro Results: Recent Results (from the past 240 hour(s))  CULTURE, BLOOD (ROUTINE X 2)     Status: Normal (Preliminary result)   Collection Time   08/16/11  6:20 AM      Component Value Range Status Comment   Specimen Description BLOOD LEFT ANTECUBITAL   Final    Special Requests BOTTLES DRAWN AEROBIC AND ANAEROBIC   Final    Culture  Setup Time [id number]   Final    Culture     Final    Value:        BLOOD CULTURE RECEIVED NO GROWTH TO DATE CULTURE WILL BE HELD FOR 5 DAYS BEFORE ISSUING A FINAL NEGATIVE REPORT   Report Status PENDING   Incomplete   URINE CULTURE     Status: Normal   Collection Time   08/16/11  6:28 AM      Component Value Range Status Comment   Specimen Description URINE, CLEAN CATCH   Final    Special Requests NONE   Final    Culture  Setup Time [id number]   Final    Colony Count 60,000 COLONIES/ML   Final  Culture     Final    Value: Multiple bacterial morphotypes present, none predominant. Suggest appropriate recollection if clinically indicated.   Report Status 08/17/2011 FINAL   Final   CULTURE, BLOOD (ROUTINE X 2)     Status: Normal (Preliminary result)   Collection Time   08/16/11  6:30 AM      Component Value Range Status Comment   Specimen Description BLOOD RIGHT ANTECUBITAL   Final    Special Requests BOTTLES DRAWN AEROBIC AND ANAEROBIC   Final    Culture  Setup Time [id number]   Final    Culture     Final    Value:        BLOOD CULTURE RECEIVED NO GROWTH TO DATE CULTURE WILL BE HELD FOR 5 DAYS BEFORE ISSUING A FINAL NEGATIVE REPORT   Report Status PENDING   Incomplete   STOOL CULTURE     Status: Normal (Preliminary result)   Collection Time   08/16/11  8:27 AM      Component Value Range Status Comment   Specimen Description STOOL   Final    Special Requests NONE   Final    Culture NO SUSPICIOUS COLONIES, CONTINUING TO  HOLD   Final    Report Status PENDING   Incomplete   CLOSTRIDIUM DIFFICILE BY PCR     Status: Abnormal   Collection Time   08/16/11  8:27 AM      Component Value Range Status Comment   C difficile by pcr POSITIVE (*) NEGATIVE  Final     Studies/Results: No results found.  Medications: I have reviewed the patient's current medications. Scheduled Meds:    . aspirin EC  81 mg Oral BH-q7a  . cholecalciferol  2,000 Units Oral q morning - 10a  . ezetimibe  10 mg Oral QHS  . feeding supplement  237 mL Oral TID WC  . finasteride  5 mg Oral q morning - 10a  . gemfibrozil  600 mg Oral BID AC  . glyBURIDE  5 mg Oral Q breakfast  . insulin aspart  0-9 Units Subcutaneous TID WC  . iron polysaccharides  150 mg Oral Daily  . levothyroxine  25 mcg Oral Daily  . mirtazapine  7.5 mg Oral QHS  . omega-3 acid ethyl esters  1 g Oral BH-q7a  . potassium chloride  20 mEq Oral Once  . saccharomyces boulardii  250 mg Oral BID  . Tamsulosin HCl  0.4 mg Oral BH-q7a  . vancomycin  125 mg Oral QID   Followed by  . vancomycin  125 mg Oral BID   Followed by  . vancomycin  125 mg Oral Daily   Followed by  . vancomycin  125 mg Oral QODAY   Followed by  . vancomycin  125 mg Oral Q3 days  . warfarin  3 mg Oral ONCE-1800  . warfarin  3 mg Oral ONCE-1800  . Warfarin - Pharmacist Dosing Inpatient   Does not apply q1800   Continuous Infusions:    . sodium chloride 1,000 mL (08/19/11 1130)   PRN Meds:.acetaminophen, acetaminophen, LORazepam, ondansetron (ZOFRAN) IV, ondansetron  Assessment/Plan: 1. 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  C. difficile PCR positive on 08/16/2011.  Continue vancomycin taper started on 08/16/2011 total of 6 weeks.  Continue Florastar.   2. Dehydration. Likely diarrhea.  Continue to hold furosemide.  Continue gentle hydration.     3. History of coronary artery disease. Stable. Continue aspirin.   4. Paroxysmal a-fib.  Stable. Continue anticoagulate on Coumadin.     5. DM (diabetes mellitus), type 2, uncontrolled with complications.  Continue sensitive sliding scale insulin.  Continue home dose of glyburide.   6. Hypertension.  Borderline low however has normal mentation.  Continue to monitor.     7. Hyperlipidemia. Continue Gemfibrozil.   8. Hypothyroidism. Continue levothyroxine.   9. Weakness generalized. Likely due to diarrhea and dehydration.  Physical therapy at this time recommending home health PT.  Occupational therapy recommending home health at this time.    10. Hypokalemia. Replace as needed, likely due to diarrhea.   11. Leukocytosis. Likely due to diarrhea. Monitor.   12. Anemia.  Likely due to iron deficiency anemia.  Continue supplemental iron.   13. Decubitus ulcer stage I/II present prior to admission. Wound care.  14. Prophylaxis. Continue anticoagulation on Coumadin.   15. CODE STATUS. DO NOT RESUSCITATE/DO NOT INTUBATE.  16.  Disposition.  Pending, improvement in diarrhea.   LOS: 3 days  Etna Forquer A, MD 08/19/2011, 3:02 PM

## 2011-08-19 NOTE — Progress Notes (Signed)
ANTICOAGULATION CONSULT NOTE - Follow Up Consult  Pharmacy Consult for Warfarin Indication: atrial fibrillation  Allergies  Allergen Reactions  . Morphine And Related Other (See Comments)    Reaction unknown   Labs:  Basename 08/19/11 0414 08/18/11 0430 08/17/11 0355  HGB 11.5* 11.5* --  HCT 34.7* 34.6* --  PLT 82* 68* --  APTT -- -- --  LABPROT 24.0* 23.2* 21.5*  INR 2.11* 2.02* 1.83*  HEPARINUNFRC -- -- --  CREATININE 0.59 0.71 0.76  CKTOTAL -- -- --  CKMB -- -- --  TROPONINI -- -- --   Estimated Creatinine Clearance: 58.3 ml/min (by C-G formula based on Cr of 0.59).   Medications:  Scheduled:     . aspirin EC  81 mg Oral BH-q7a  . cholecalciferol  2,000 Units Oral q morning - 10a  . ezetimibe  10 mg Oral QHS  . feeding supplement  237 mL Oral TID WC  . finasteride  5 mg Oral q morning - 10a  . gemfibrozil  600 mg Oral BID AC  . glyBURIDE  5 mg Oral Q breakfast  . insulin aspart  0-9 Units Subcutaneous TID WC  . iron polysaccharides  150 mg Oral Daily  . levothyroxine  25 mcg Oral Daily  . mirtazapine  7.5 mg Oral QHS  . omega-3 acid ethyl esters  1 g Oral BH-q7a  . potassium chloride  20 mEq Oral Once  . saccharomyces boulardii  250 mg Oral BID  . Tamsulosin HCl  0.4 mg Oral BH-q7a  . vancomycin  125 mg Oral QID   Followed by  . vancomycin  125 mg Oral BID   Followed by  . vancomycin  125 mg Oral Daily   Followed by  . vancomycin  125 mg Oral QODAY   Followed by  . vancomycin  125 mg Oral Q3 days  . warfarin  3 mg Oral ONCE-1800  . Warfarin - Pharmacist Dosing Inpatient   Does not apply q1800    Assessment:  78 yo male on chronic coumadin for AFib; home dose 3mg  daily.  Cdiff colitis on Vancomycin taper, continues with diarrhea.  Po intake: improved, on Glucerna shakes tid.  INR low therapeutic range this am.  Goal of Therapy:  INR 2-3   Plan:   Repeat 3mg  Coumadin today  Continue daily protimes  Encourage po intake.   Hessie Knows, PharmD, BCPS pager [phone] 08/19/2011,12:38 PM

## 2011-08-20 LAB — GLUCOSE, CAPILLARY
Glucose-Capillary: 134 mg/dL — ABNORMAL HIGH (ref 70–99)
Glucose-Capillary: 158 mg/dL — ABNORMAL HIGH (ref 70–99)
Glucose-Capillary: 85 mg/dL (ref 70–99)

## 2011-08-20 MED ORDER — POTASSIUM CHLORIDE CRYS ER 20 MEQ PO TBCR
20.0000 meq | EXTENDED_RELEASE_TABLET | Freq: Once | ORAL | Status: AC
  Administered 2011-08-20: 20 meq via ORAL
  Filled 2011-08-20: qty 1

## 2011-08-20 MED ORDER — WARFARIN SODIUM 3 MG PO TABS
3.0000 mg | ORAL_TABLET | Freq: Once | ORAL | Status: AC
  Administered 2011-08-20: 3 mg via ORAL
  Filled 2011-08-20: qty 1

## 2011-08-20 NOTE — Progress Notes (Signed)
ANTICOAGULATION CONSULT NOTE - Follow Up Consult  Pharmacy Consult for Warfarin Indication: atrial fibrillation  Allergies  Allergen Reactions  . Morphine And Related Other (See Comments)    Reaction unknown   Labs:  Basename 08/20/11 0417 08/19/11 0414 08/18/11 0430  HGB -- 11.5* 11.5*  HCT -- 34.7* 34.6*  PLT -- 82* 68*  APTT -- -- --  LABPROT 24.1* 24.0* 23.David*  INR David.12* David.11* David.02*  HEPARINUNFRC -- -- --  CREATININE -- 0.59 0.71  CKTOTAL -- -- --  CKMB -- -- --  TROPONINI -- -- --   Estimated Creatinine Clearance: 58.3 ml/min (by C-G formula based on Cr of 0.59).   Medications:  Scheduled:     . aspirin EC  81 mg Oral BH-q7a  . cholecalciferol  David,000 Units Oral q morning - 10a  . ezetimibe  10 mg Oral QHS  . feeding supplement  237 mL Oral TID WC  . finasteride  5 mg Oral q morning - 10a  . gemfibrozil  600 mg Oral BID AC  . glyBURIDE  5 mg Oral Q breakfast  . insulin aspart  0-9 Units Subcutaneous TID WC  . iron polysaccharides  150 mg Oral Daily  . levothyroxine  25 mcg Oral Daily  . mirtazapine  7.5 mg Oral QHS  . omega-3 acid ethyl esters  1 g Oral BH-q7a  . potassium chloride  20 mEq Oral Once  . saccharomyces boulardii  250 mg Oral BID  . Tamsulosin HCl  0.4 mg Oral BH-q7a  . vancomycin  125 mg Oral QID   Followed by  . vancomycin  125 mg Oral BID   Followed by  . vancomycin  125 mg Oral Daily   Followed by  . vancomycin  125 mg Oral QODAY   Followed by  . vancomycin  125 mg Oral Q3 days  . warfarin  3 mg Oral ONCE-1800  . Warfarin - Pharmacist Dosing Inpatient   Does not apply q1800    Assessment:  78 yo Davidson on chronic coumadin for AFib; home dose 3mg  daily.  Cdiff colitis on Vancomycin taper, continues with diarrhea - 4 episodes over night.  INR low therapeutic range this am but stable  Goal of Therapy:  INR David-3   Plan:   Repeat 3mg  Coumadin today  Continue daily protimes   Hessie Knows, PharmD, BCPS pager  [phone] 08/20/2011,10:55 AM

## 2011-08-20 NOTE — Progress Notes (Signed)
Subjective: Patient had 4 bowel movements during the night.  No other specific complaints or concerns.  Objective: Vital signs in last 24 hours: Filed Vitals:   08/19/11 0508 08/19/11 1436 08/19/11 2125 08/20/11 0600  BP: 95/62 97/62 106/70 111/79  Pulse: 109 114 113 109  Temp: 97.4 F (36.3 C) 98.5 F (36.9 C) 97.7 F (36.5 C) 97.1 F (36.2 C)  TempSrc: Oral Oral Oral Oral  Resp: 16 18 19 19   Height:      Weight: 86.6 kg (190 lb 14.7 oz)     SpO2: 97% 95% 95% 97%   Weight change:   Intake/Output Summary (Last 24 hours) at 08/20/11 1009 Last data filed at 08/20/11 0600  Gross per 24 hour  Intake   1240 ml  Output    502 ml  Net    738 ml    Physical Exam: General: Awake, Oriented, No acute distress. HEENT: EOMI. Neck: Supple CV: S1 and S2 Lungs: Clear to ascultation bilaterally Abdomen: Soft, Nontender, Nondistended, +bowel sounds. Ext: Good pulses. Trace edema.  Lab Results:  Basename 08/19/11 0414 08/18/11 0430  NA 143 141  K 3.6 3.0*  CL 115* 113*  CO2 23 23  GLUCOSE 120* 152*  BUN 19 20  CREATININE 0.59 0.71  CALCIUM 8.0* 8.1*  MG -- --  PHOS -- --   No results found for this basename: AST:2,ALT:2,ALKPHOS:2,BILITOT:2,PROT:2,ALBUMIN:2 in the last 72 hours No results found for this basename: LIPASE:2,AMYLASE:2 in the last 72 hours  Basename 08/19/11 0414 08/18/11 0430  WBC 7.0 10.1  NEUTROABS -- --  HGB 11.5* 11.5*  HCT 34.7* 34.6*  MCV 94.8 94.8  PLT 82* 68*   No results found for this basename: CKTOTAL:3,CKMB:3,CKMBINDEX:3,TROPONINI:3 in the last 72 hours No components found with this basename: POCBNP:3 No results found for this basename: DDIMER:2 in the last 72 hours No results found for this basename: HGBA1C:2 in the last 72 hours No results found for this basename: CHOL:2,HDL:2,LDLCALC:2,TRIG:2,CHOLHDL:2,LDLDIRECT:2 in the last 72 hours No results found for this basename: TSH,T4TOTAL,FREET3,T3FREE,THYROIDAB in the last 72 hours No results  found for this basename: VITAMINB12:2,FOLATE:2,FERRITIN:2,TIBC:2,IRON:2,RETICCTPCT:2 in the last 72 hours  Micro Results: Recent Results (from the past 240 hour(s))  CULTURE, BLOOD (ROUTINE X 2)     Status: Normal (Preliminary result)   Collection Time   08/16/11  6:20 AM      Component Value Range Status Comment   Specimen Description BLOOD LEFT ANTECUBITAL   Final    Special Requests BOTTLES DRAWN AEROBIC AND ANAEROBIC   Final    Culture  Setup Time [id number]   Final    Culture     Final    Value:        BLOOD CULTURE RECEIVED NO GROWTH TO DATE CULTURE WILL BE HELD FOR 5 DAYS BEFORE ISSUING A FINAL NEGATIVE REPORT   Report Status PENDING   Incomplete   URINE CULTURE     Status: Normal   Collection Time   08/16/11  6:28 AM      Component Value Range Status Comment   Specimen Description URINE, CLEAN CATCH   Final    Special Requests NONE   Final    Culture  Setup Time [id number]   Final    Colony Count 60,000 COLONIES/ML   Final    Culture     Final    Value: Multiple bacterial morphotypes present, none predominant. Suggest appropriate recollection if clinically indicated.   Report Status 08/17/2011 FINAL   Final  CULTURE, BLOOD (ROUTINE X 2)     Status: Normal (Preliminary result)   Collection Time   08/16/11  6:30 AM      Component Value Range Status Comment   Specimen Description BLOOD RIGHT ANTECUBITAL   Final    Special Requests BOTTLES DRAWN AEROBIC AND ANAEROBIC   Final    Culture  Setup Time [id number]   Final    Culture     Final    Value:        BLOOD CULTURE RECEIVED NO GROWTH TO DATE CULTURE WILL BE HELD FOR 5 DAYS BEFORE ISSUING A FINAL NEGATIVE REPORT   Report Status PENDING   Incomplete   STOOL CULTURE     Status: Normal   Collection Time   08/16/11  8:27 AM      Component Value Range Status Comment   Specimen Description STOOL   Final    Special Requests NONE   Final    Culture     Final    Value: NO SALMONELLA, SHIGELLA, CAMPYLOBACTER, OR YERSINIA  ISOLATED   Report Status 08/19/2011 FINAL   Final   CLOSTRIDIUM DIFFICILE BY PCR     Status: Abnormal   Collection Time   08/16/11  8:27 AM      Component Value Range Status Comment   C difficile by pcr POSITIVE (*) NEGATIVE  Final     Studies/Results: No results found.  Medications: I have reviewed the patient's current medications. Scheduled Meds:    . aspirin EC  81 mg Oral BH-q7a  . cholecalciferol  2,000 Units Oral q morning - 10a  . ezetimibe  10 mg Oral QHS  . feeding supplement  237 mL Oral TID WC  . finasteride  5 mg Oral q morning - 10a  . gemfibrozil  600 mg Oral BID AC  . glyBURIDE  5 mg Oral Q breakfast  . insulin aspart  0-9 Units Subcutaneous TID WC  . iron polysaccharides  150 mg Oral Daily  . levothyroxine  25 mcg Oral Daily  . mirtazapine  7.5 mg Oral QHS  . omega-3 acid ethyl esters  1 g Oral BH-q7a  . saccharomyces boulardii  250 mg Oral BID  . Tamsulosin HCl  0.4 mg Oral BH-q7a  . vancomycin  125 mg Oral QID   Followed by  . vancomycin  125 mg Oral BID   Followed by  . vancomycin  125 mg Oral Daily   Followed by  . vancomycin  125 mg Oral QODAY   Followed by  . vancomycin  125 mg Oral Q3 days  . warfarin  3 mg Oral ONCE-1800  . Warfarin - Pharmacist Dosing Inpatient   Does not apply q1800   Continuous Infusions:    . sodium chloride 50 mL/hr at 08/20/11 0940   PRN Meds:.acetaminophen, acetaminophen, LORazepam, ondansetron (ZOFRAN) IV, ondansetron  Assessment/Plan: 1. 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  C. difficile PCR positive on 08/16/2011.  Continue vancomycin taper started on 08/16/2011 total of 6 weeks.  Continue Florastar.   2. Dehydration. Likely diarrhea.  Continue to hold furosemide.  Continue gentle hydration.     3. History of coronary artery disease. Stable. Continue aspirin.   4. Paroxysmal a-fib.  Stable. Continue anticoagulate on Coumadin.   5. DM (diabetes mellitus), type 2, uncontrolled with complications.   Continue sensitive sliding scale insulin.  Continue home dose of glyburide.   6. Hypertension.  Borderline low however has normal mentation.  Continue to monitor.  7. Hyperlipidemia. Continue Gemfibrozil.   8. Hypothyroidism. Continue levothyroxine.   9. Weakness generalized. Likely due to diarrhea and dehydration.  Physical therapy at this time recommending home health PT.  Occupational therapy recommending home health at this time.    10. Hypokalemia. Replace as needed, likely due to diarrhea.   11. Leukocytosis. Likely due to diarrhea. Monitor.   12. Anemia.  Likely due to iron deficiency anemia.  Continue supplemental iron.   13. Decubitus ulcer stage I/II present prior to admission. Wound care.  14. Prophylaxis. Continue anticoagulation on Coumadin.   15. CODE STATUS. DO NOT RESUSCITATE/DO NOT INTUBATE.  16.  Disposition.  Pending, improvement in diarrhea.   LOS: 4 days  Jashad Depaula A, MD 08/20/2011, 10:09 AM

## 2011-08-21 LAB — BASIC METABOLIC PANEL
CO2: 24 mEq/L (ref 19–32)
Calcium: 8.1 mg/dL — ABNORMAL LOW (ref 8.4–10.5)
Creatinine, Ser: 0.57 mg/dL (ref 0.50–1.35)
Glucose, Bld: 158 mg/dL — ABNORMAL HIGH (ref 70–99)

## 2011-08-21 LAB — CBC
MCH: 30.9 pg (ref 26.0–34.0)
MCHC: 32.8 g/dL (ref 30.0–36.0)
MCV: 94.3 fL (ref 78.0–100.0)
Platelets: 109 10*3/uL — ABNORMAL LOW (ref 150–400)
RBC: 3.88 MIL/uL — ABNORMAL LOW (ref 4.22–5.81)

## 2011-08-21 LAB — GLUCOSE, CAPILLARY

## 2011-08-21 MED ORDER — VANCOMYCIN 50 MG/ML ORAL SOLUTION: ORAL | Status: DC

## 2011-08-21 MED ORDER — WARFARIN SODIUM 3 MG PO TABS
3.0000 mg | ORAL_TABLET | Freq: Once | ORAL | Status: DC
  Filled 2011-08-21: qty 1

## 2011-08-21 MED ORDER — POLYSACCHARIDE IRON COMPLEX 150 MG PO CAPS: 150.0000 mg | ORAL_CAPSULE | Freq: Every day | ORAL | Status: DC

## 2011-08-21 MED ORDER — GLUCERNA SHAKE PO LIQD: 237.0000 mL | Freq: Three times a day (TID) | ORAL | Status: DC

## 2011-08-21 NOTE — Progress Notes (Signed)
ANTICOAGULATION CONSULT NOTE - Follow Up Consult  Pharmacy Consult for Warfarin Indication: atrial fibrillation  Allergies  Allergen Reactions  . Morphine And Related Other (See Comments)    Reaction unknown   Labs:  Basename 08/21/11 0400 08/20/11 0417 08/19/11 0414  HGB 12.0* -- 11.5*  HCT 36.6* -- 34.7*  PLT 109* -- 82*  APTT -- -- --  LABPROT 25.6* 24.1* 24.0*  INR 2.29* 2.12* 2.11*  HEPARINUNFRC -- -- --  CREATININE 0.57 -- 0.59  CKTOTAL -- -- --  CKMB -- -- --  TROPONINI -- -- --   Estimated Creatinine Clearance: 63.7 ml/min (by C-G formula based on Cr of 0.57).   Medications:  Scheduled:     . aspirin EC  81 mg Oral BH-q7a  . cholecalciferol  2,000 Units Oral q morning - 10a  . ezetimibe  10 mg Oral QHS  . feeding supplement  237 mL Oral TID WC  . finasteride  5 mg Oral q morning - 10a  . gemfibrozil  600 mg Oral BID AC  . glyBURIDE  5 mg Oral Q breakfast  . insulin aspart  0-9 Units Subcutaneous TID WC  . iron polysaccharides  150 mg Oral Daily  . levothyroxine  25 mcg Oral Daily  . mirtazapine  7.5 mg Oral QHS  . omega-3 acid ethyl esters  1 g Oral BH-q7a  . potassium chloride  20 mEq Oral Once  . saccharomyces boulardii  250 mg Oral BID  . Tamsulosin HCl  0.4 mg Oral BH-q7a  . vancomycin  125 mg Oral QID   Followed by  . vancomycin  125 mg Oral BID   Followed by  . vancomycin  125 mg Oral Daily   Followed by  . vancomycin  125 mg Oral QODAY   Followed by  . vancomycin  125 mg Oral Q3 days  . warfarin  3 mg Oral ONCE-1800  . Warfarin - Pharmacist Dosing Inpatient   Does not apply q1800    Assessment:  78 yo male on chronic coumadin for Afib; home dose 3mg  daily.  Cdiff colitis on Vancomycin taper, continues with diarrhea - 7 stools recorded on 3/10.  Concurrent gemfibrozil and ECASA.  INR in therapeutic range.  Goal of Therapy:  INR 2-3   Plan:   Repeat 3mg  Coumadin today  Continue daily protimes  Charolotte Eke, PharmD, pager  [phone]. 08/21/2011,10:17 AM.

## 2011-08-21 NOTE — Progress Notes (Addendum)
Subjective: Diarrhea improved.  Evaluated by physical therapy who again agreed with home health at this time.  Objective: Vital signs in last 24 hours: Filed Vitals:   08/20/11 0600 08/20/11 1426 08/20/11 2145 08/21/11 0555  BP: 111/79 103/68 112/68 110/69  Pulse: 109 80 115 110  Temp: 97.1 F (36.2 C) 98.6 F (37 C) 98.3 F (36.8 C) 97.4 F (36.3 C)  TempSrc: Oral Oral Oral Oral  Resp: 19 18 19 16   Height:      Weight:    98.6 kg (217 lb 6 oz)  SpO2: 97% 96% 93% 98%   Weight change:   Intake/Output Summary (Last 24 hours) at 08/21/11 1135 Last data filed at 08/21/11 0555  Gross per 24 hour  Intake   1100 ml  Output    500 ml  Net    600 ml    Physical Exam: General: Awake, Oriented, No acute distress. HEENT: EOMI. Neck: Supple CV: S1 and S2 Lungs: Clear to ascultation bilaterally Abdomen: Soft, Nontender, Nondistended, +bowel sounds. Ext: Good pulses. Trace edema.  Lab Results:  Basename 08/21/11 0400 08/19/11 0414  NA 137 143  K 3.7 3.6  CL 107 115*  CO2 24 23  GLUCOSE 158* 120*  BUN 12 19  CREATININE 0.57 0.59  CALCIUM 8.1* 8.0*  MG -- --  PHOS -- --   No results found for this basename: AST:2,ALT:2,ALKPHOS:2,BILITOT:2,PROT:2,ALBUMIN:2 in the last 72 hours No results found for this basename: LIPASE:2,AMYLASE:2 in the last 72 hours  Basename 08/21/11 0400 08/19/11 0414  WBC 6.0 7.0  NEUTROABS -- --  HGB 12.0* 11.5*  HCT 36.6* 34.7*  MCV 94.3 94.8  PLT 109* 82*   No results found for this basename: CKTOTAL:3,CKMB:3,CKMBINDEX:3,TROPONINI:3 in the last 72 hours No components found with this basename: POCBNP:3 No results found for this basename: DDIMER:2 in the last 72 hours No results found for this basename: HGBA1C:2 in the last 72 hours No results found for this basename: CHOL:2,HDL:2,LDLCALC:2,TRIG:2,CHOLHDL:2,LDLDIRECT:2 in the last 72 hours No results found for this basename: TSH,T4TOTAL,FREET3,T3FREE,THYROIDAB in the last 72 hours No results  found for this basename: VITAMINB12:2,FOLATE:2,FERRITIN:2,TIBC:2,IRON:2,RETICCTPCT:2 in the last 72 hours  Micro Results: Recent Results (from the past 240 hour(s))  CULTURE, BLOOD (ROUTINE X 2)     Status: Normal (Preliminary result)   Collection Time   08/16/11  6:20 AM      Component Value Range Status Comment   Specimen Description BLOOD LEFT ANTECUBITAL   Final    Special Requests BOTTLES DRAWN AEROBIC AND ANAEROBIC   Final    Culture  Setup Time [id number]   Final    Culture     Final    Value:        BLOOD CULTURE RECEIVED NO GROWTH TO DATE CULTURE WILL BE HELD FOR 5 DAYS BEFORE ISSUING A FINAL NEGATIVE REPORT   Report Status PENDING   Incomplete   URINE CULTURE     Status: Normal   Collection Time   08/16/11  6:28 AM      Component Value Range Status Comment   Specimen Description URINE, CLEAN CATCH   Final    Special Requests NONE   Final    Culture  Setup Time [id number]   Final    Colony Count 60,000 COLONIES/ML   Final    Culture     Final    Value: Multiple bacterial morphotypes present, none predominant. Suggest appropriate recollection if clinically indicated.   Report Status 08/17/2011 FINAL   Final  CULTURE, BLOOD (ROUTINE X 2)     Status: Normal (Preliminary result)   Collection Time   08/16/11  6:30 AM      Component Value Range Status Comment   Specimen Description BLOOD RIGHT ANTECUBITAL   Final    Special Requests BOTTLES DRAWN AEROBIC AND ANAEROBIC   Final    Culture  Setup Time [id number]   Final    Culture     Final    Value:        BLOOD CULTURE RECEIVED NO GROWTH TO DATE CULTURE WILL BE HELD FOR 5 DAYS BEFORE ISSUING A FINAL NEGATIVE REPORT   Report Status PENDING   Incomplete   STOOL CULTURE     Status: Normal   Collection Time   08/16/11  8:27 AM      Component Value Range Status Comment   Specimen Description STOOL   Final    Special Requests NONE   Final    Culture     Final    Value: NO SALMONELLA, SHIGELLA, CAMPYLOBACTER, OR YERSINIA  ISOLATED   Report Status 08/19/2011 FINAL   Final   CLOSTRIDIUM DIFFICILE BY PCR     Status: Abnormal   Collection Time   08/16/11  8:27 AM      Component Value Range Status Comment   C difficile by pcr POSITIVE (*) NEGATIVE  Final     Studies/Results: No results found.  Medications: I have reviewed the patient's current medications. Scheduled Meds:    . aspirin EC  81 mg Oral BH-q7a  . cholecalciferol  2,000 Units Oral q morning - 10a  . ezetimibe  10 mg Oral QHS  . feeding supplement  237 mL Oral TID WC  . finasteride  5 mg Oral q morning - 10a  . gemfibrozil  600 mg Oral BID AC  . glyBURIDE  5 mg Oral Q breakfast  . insulin aspart  0-9 Units Subcutaneous TID WC  . iron polysaccharides  150 mg Oral Daily  . levothyroxine  25 mcg Oral Daily  . mirtazapine  7.5 mg Oral QHS  . omega-3 acid ethyl esters  1 g Oral BH-q7a  . potassium chloride  20 mEq Oral Once  . saccharomyces boulardii  250 mg Oral BID  . Tamsulosin HCl  0.4 mg Oral BH-q7a  . vancomycin  125 mg Oral QID   Followed by  . vancomycin  125 mg Oral BID   Followed by  . vancomycin  125 mg Oral Daily   Followed by  . vancomycin  125 mg Oral QODAY   Followed by  . vancomycin  125 mg Oral Q3 days  . warfarin  3 mg Oral ONCE-1800  . warfarin  3 mg Oral ONCE-1800  . Warfarin - Pharmacist Dosing Inpatient   Does not apply q1800   Continuous Infusions:    . sodium chloride 50 mL/hr at 08/21/11 0620   PRN Meds:.acetaminophen, acetaminophen, LORazepam, ondansetron (ZOFRAN) IV, ondansetron  Assessment/Plan: 1. 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  C. difficile PCR positive on 08/16/2011.  Continue vancomycin taper started on 08/16/2011 total of 6 weeks.  Continue Florastar.   2. Dehydration. Likely diarrhea.  Continue to hold furosemide, to be resumed on 08/23/2011.  Continue gentle hydration.     3. History of coronary artery disease. Stable. Continue aspirin.   4. Paroxysmal a-fib.  Stable.  Continue anticoagulate on Coumadin.   5. DM (diabetes mellitus), type 2, uncontrolled with complications.  Continue sensitive sliding scale  insulin.  Continue home dose of glyburide.   6. Hypertension.  Borderline low however has normal mentation.  Continue to monitor.     7. Hyperlipidemia. Continue Gemfibrozil.   8. Hypothyroidism. Continue levothyroxine.   9. Weakness generalized. Likely due to diarrhea and dehydration.  Physical therapy at this time recommending home health PT.  Occupational therapy recommending home health at this time.    10. Hypokalemia. Replace as needed, likely due to diarrhea.   11. Leukocytosis. Likely due to diarrhea. Monitor.   12. Anemia.  Likely due to iron deficiency anemia.  Continue supplemental iron.   13. Decubitus ulcer stage I/II present prior to admission. Wound care.  14. Prophylaxis. Continue anticoagulation on Coumadin.   15. Protein calorie malnutrition. Continue glucerna.  16. CODE STATUS. DO NOT RESUSCITATE/DO NOT INTUBATE.  17.  Disposition.  Pending, improvement in diarrhea.   LOS: 5 days  Dorna Mallet A, MD 08/21/2011, 11:35 AM

## 2011-08-21 NOTE — Progress Notes (Signed)
Pt. Out  Of bed with assist of two and his walker. Transfers well.. Sitting up eating his breakfast  son in visiting. Physical therapy in to work with pt. Appetite is good. Pt. Likes glycerna. Discharge instruction explained to son and pt. Prescription given to son.

## 2011-08-21 NOTE — Discharge Summary (Addendum)
Discharge Summary  David Davidson MR#: [id number]  DOB:1910-10-11  Date of Admission: 08/16/2011 Date of Discharge: 08/21/2011  Patient's PCP: [name], MD, MD  Attending Physician:Shalona Harbour A  Consults: None.  Discharge Diagnoses: Principal Problem:  *C. difficile colitis Active Problems:  CAD (coronary artery disease)  Paroxysmal a-fib  DM (diabetes mellitus), type 2, uncontrolled with complications  Hypertension  Hyperlipidemia  Hypothyroidism  Diarrhea  Weakness generalized  Dehydration  Hypokalemia  Leukocytosis  Anemia   Brief Admitting History and Physical 78 year old gentleman with history of diabetes type 2, hypertension, coronary disease, C. difficile since December of 2012 presented with diarrhea and was admitted for recurrent C. difficile colitis.  Discharge Medications Medication List  As of 08/21/2011 11:45 AM   TAKE these medications         acetaminophen 500 MG tablet   Commonly known as: TYLENOL   Take 1,000 mg by mouth at bedtime as needed. For pain      aspirin 81 MG tablet   Take 81 mg by mouth every morning.      CALMOSEPTINE EX   Apply 1 application topically daily. Apply to buttocks every shift      cholecalciferol 1000 UNITS tablet   Commonly known as: VITAMIN D   Take 2,000 Units by mouth every morning.      ezetimibe 10 MG tablet   Commonly known as: ZETIA   Take 10 mg by mouth at bedtime.      feeding supplement Liqd   Take 237 mLs by mouth 3 (three) times daily with meals.      finasteride 5 MG tablet   Commonly known as: PROSCAR   Take 5 mg by mouth every morning.      fish oil-omega-3 fatty acids 1000 MG capsule   Take 1 g by mouth every morning.      furosemide 20 MG tablet   Commonly known as: LASIX   Take 20 mg by mouth every other day. Hold for SBP < 100.      gemfibrozil 600 MG tablet   Commonly known as: LOPID   Take 600 mg by mouth 2 (two) times daily before a meal.      glyBURIDE 5 MG tablet   Commonly known as: DIABETA   Take 5 mg by mouth.      hydrocortisone cream 1 %   Apply 1 application topically 2 (two) times daily. itchy back rash      iron polysaccharides 150 MG capsule   Commonly known as: NIFEREX   Take 1 capsule (150 mg total) by mouth daily.      JANTOVEN 3 MG tablet   Generic drug: warfarin   Take 3 mg by mouth every evening.      levothyroxine 25 MCG tablet   Commonly known as: SYNTHROID, LEVOTHROID   Take 25 mcg by mouth daily.      LORazepam 0.5 MG tablet   Commonly known as: ATIVAN   Take 0.5 mg by mouth at bedtime as needed. For insomnia.      mirtazapine 7.5 MG tablet   Commonly known as: REMERON   Take 7.5 mg by mouth at bedtime.      saccharomyces boulardii 250 MG capsule   Commonly known as: FLORASTOR   Take 250 mg by mouth 2 (two) times daily. Take for 14 days      Tamsulosin HCl 0.4 MG Caps   Commonly known as: FLOMAX   Take 0.4 mg by mouth every morning. Hold for  SBP < 100.      vancomycin 50 mg/mL oral solution   Commonly known as: VANCOCIN   125 mg, Oral, 4 times daily, First dose on Wed 08/16/11 at 1000, For 7 days  125 mg, Oral, 2 times daily, First dose on Wed 08/23/11 at 1000, For 7 days  125 mg, Oral, Daily, First dose on Wed 08/30/11 at 1000, For 7 days  125 mg, Oral, Every other day, First dose on Wed 09/06/11 at 1000, For 7 days  125 mg, Oral, Every 3 DAYS, First dose on Thu 09/14/11 at 1000, For 14 days            Hospital Course: 1. 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 C. difficile PCR positive on 08/16/2011.  Recurrent and third hospitalization since December of 2012.  Patient was started on vancomycin taper started on 08/16/2011 which will be continued for total of 6 weeks. Continue Florastar.  2. Dehydration. Likely diarrhea.  Held furosemide, to be resumed on 08/23/2011. Continue gentle hydration.  3. History of coronary artery disease. Stable. Continue aspirin.  4. Paroxysmal a-fib. Stable. Continue  anticoagulate on Coumadin.  5. DM (diabetes mellitus), type 2, uncontrolled with complications. Continue sensitive sliding scale insulin. Continue home dose of glyburide.  6. Hypertension. Borderline low however has normal mentation. Continue to monitor.  7. Hyperlipidemia. Continue Gemfibrozil.  8. Hypothyroidism. Continue levothyroxine.  9. Weakness generalized. Likely due to diarrhea and dehydration. Physical therapy at this time recommending home health PT. Occupational therapy recommending home health at this time.  Will arrange home health prior to discharge to 10. Hypokalemia. Replace as needed, likely due to diarrhea.  11. Leukocytosis. Likely due to diarrhea. Monitor.  12. Anemia. Likely due to iron deficiency anemia. Continue supplemental iron.  13. Decubitus ulcer stage I/II present prior to admission. Wound care.  14. Protein calorie malnutrition. Continue glucerna. 15. CODE STATUS. DO NOT RESUSCITATE/DO NOT INTUBATE.  Day of Discharge BP 110/69  Pulse 110  Temp(Src) 97.4 F (36.3 C) (Oral)  Resp 16  Ht 6\' 1"  (1.854 m)  Wt 98.6 kg (217 lb 6 oz)  BMI 28.68 kg/m2  SpO2 98%  Results for orders placed during the hospital encounter of 08/16/11 (from the past 48 hour(s))  GLUCOSE, CAPILLARY     Status: Abnormal   Collection Time   08/19/11 12:27 PM      Component Value Range Comment   Glucose-Capillary 190 (*) 70 - 99 (mg/dL)    Comment 1 Documented in Chart      Comment 2 Notify RN     GLUCOSE, CAPILLARY     Status: Normal   Collection Time   08/19/11  4:37 PM      Component Value Range Comment   Glucose-Capillary 92  70 - 99 (mg/dL)    Comment 1 Documented in Chart      Comment 2 Notify RN     GLUCOSE, CAPILLARY     Status: Abnormal   Collection Time   08/19/11  9:14 PM      Component Value Range Comment   Glucose-Capillary 134 (*) 70 - 99 (mg/dL)    Comment 1 Notify RN     PROTIME-INR     Status: Abnormal   Collection Time   08/20/11  4:17 AM      Component Value  Range Comment   Prothrombin Time 24.1 (*) 11.6 - 15.2 (seconds)    INR 2.12 (*) 0.00 - 1.49    GLUCOSE, CAPILLARY  Status: Abnormal   Collection Time   08/20/11  6:21 AM      Component Value Range Comment   Glucose-Capillary 104 (*) 70 - 99 (mg/dL)    Comment 1 Notify RN     GLUCOSE, CAPILLARY     Status: Abnormal   Collection Time   08/20/11 11:07 AM      Component Value Range Comment   Glucose-Capillary 158 (*) 70 - 99 (mg/dL)    Comment 1 Documented in Chart      Comment 2 Notify RN     GLUCOSE, CAPILLARY     Status: Normal   Collection Time   08/20/11  5:32 PM      Component Value Range Comment   Glucose-Capillary 85  70 - 99 (mg/dL)   GLUCOSE, CAPILLARY     Status: Abnormal   Collection Time   08/20/11  8:46 PM      Component Value Range Comment   Glucose-Capillary 171 (*) 70 - 99 (mg/dL)    Comment 1 Notify RN     PROTIME-INR     Status: Abnormal   Collection Time   08/21/11  4:00 AM      Component Value Range Comment   Prothrombin Time 25.6 (*) 11.6 - 15.2 (seconds)    INR 2.29 (*) 0.00 - 1.49    CBC     Status: Abnormal   Collection Time   08/21/11  4:00 AM      Component Value Range Comment   WBC 6.0  4.0 - 10.5 (K/uL)    RBC 3.88 (*) 4.22 - 5.81 (MIL/uL)    Hemoglobin 12.0 (*) 13.0 - 17.0 (g/dL)    HCT 16.1 (*) 09.6 - 52.0 (%)    MCV 94.3  78.0 - 100.0 (fL)    MCH 30.9  26.0 - 34.0 (pg)    MCHC 32.8  30.0 - 36.0 (g/dL)    RDW 04.5 (*) 40.9 - 15.5 (%)    Platelets 109 (*) 150 - 400 (K/uL)   BASIC METABOLIC PANEL     Status: Abnormal   Collection Time   08/21/11  4:00 AM      Component Value Range Comment   Sodium 137  135 - 145 (mEq/L)    Potassium 3.7  3.5 - 5.1 (mEq/L)    Chloride 107  96 - 112 (mEq/L)    CO2 24  19 - 32 (mEq/L)    Glucose, Bld 158 (*) 70 - 99 (mg/dL)    BUN 12  6 - 23 (mg/dL)    Creatinine, Ser 8.11  0.50 - 1.35 (mg/dL)    Calcium 8.1 (*) 8.4 - 10.5 (mg/dL)    GFR calc non Af Amer 83 (*) >90 (mL/min)    GFR calc Af Amer >90  >90  (mL/min)     Dg Abd Acute W/chest  08/16/2011  *RADIOLOGY REPORT*  Clinical Data: Mid abdominal pain, diarrhea, history hypertension, diabetes, coronary disease post MI, CHF  ACUTE ABDOMEN SERIES (ABDOMEN 2 VIEW & CHEST 1 VIEW)  Comparison: Chest radiograph 06/25/2011  Findings: Enlargement of cardiac silhouette post CABG. Pulmonary vascular congestion. Left basilar atelectasis without infiltrate or effusion. Skin fold projects over left lung. Surgical clips right upper quadrant. Few nonspecific minimally prominent small bowel loops in abdomen. No definite evidence of bowel obstruction or bowel wall thickening. No free intraperitoneal air. Osseous demineralization with degenerative disc disease changes lumbar spine.  IMPRESSION: No definite acute abdominal findings. Enlargement of cardiac silhouette post CABG. Left basilar  atelectasis.  Original Report Authenticated By: Lollie Marrow, M.D.     Disposition: Home with home health physical therapy and occupational therapy.  Diet: Diabetic diet  Activity: Resume as tolerated   Follow-up Appts: Discharge Orders    Future Orders Please Complete By Expires   Diet Carb Modified      Increase activity slowly      Discharge instructions      Comments:   Followup with [name], MD (PCP) in 1 week, please have your electrolytes checked at the next clinic visit. Home health please draw PT/INR and have the results sent to PCP's office. Please resume furosemide (lasix) on 08/23/2011.      TESTS THAT NEED FOLLOW-UP None  Time spent on discharge, talking to the patient, and coordinating care: 35 mins.   Signed: Cristal Ford, MD 08/21/2011, 11:45 AM

## 2011-08-21 NOTE — Clinical Documentation Improvement (Signed)
MALNUTRITION DOCUMENTATION CLARIFICATION  THIS DOCUMENT IS NOT A PERMANENT PART OF THE MEDICAL RECORD  TO RESPOND TO THE THIS QUERY, FOLLOW THE INSTRUCTIONS BELOW:  1. If needed, update documentation for the patient's encounter via the notes activity.  2. Access this query again and click edit on the In [employer].  3. After updating, or not, click F2 to complete all highlighted (required) [place] concerning your review. Select "additional documentation in the medical record" OR "no additional documentation provided".  4. Click Sign note button.  5. The deficiency will fall out of your In Basket *Please let us know if you are not able to complete this workflow by phone or e-mail (listed below).  Please update your documentation within the medical record to reflect your response to this query.                                                                                        08/21/11   Dear Dr.Shamona Wirtz / Associates,  In a better effort to capture your patient's severity of illness, reflect appropriate length of stay and utilization of resources, a review of the patient medical record has revealed the following indicators.    Based on your clinical judgment, please clarify and document in a progress note and/or discharge summary the clinical condition associated with the following supporting information:  In responding to this query please exercise your independent judgment.  The fact that a query is asked, does not imply that any particular answer is desired or expected  ADULT NUTRITION ASSESSMENT   Date: 08/17/2011    Please clarify /validate in progress notes or d/c summary  : nutritional consult assessment  indicating pt meets criteria  .  Moderate Malnutrition  .  Protein Calorie Malnutrition  .  Emaciation   .  Cachexia    Other Condition (please specify)  Cannot Clinically Determine   _______Moderate Malnutrition  _______Protein Calorie Malnutrition _______Severe  Protein Calorie Malnutrition _______Emaciation  _______Cachexia   _______Other Condition________________ _______Cannot clinically determine     Supporting Information: Risk Factors:  Chronic Diarrhea, Decubitus ulcer,   Signs & Symptoms: -Ht:   33ft 1 in     Wt: 185 lbs   -BMI:24.4  -Weight  Loss of 16.5% of usual body weight in six months,evident mild wasting in face and neck region  -Albumin level: 2.3   -Total Protein: 5.5   -Calcium level: 7.8  Treatments: Monitoring  I&O, WT., Labs  probiotic Florastar  Glucerna TID with meals   IVF NS @ 75  You may use possible, probable, or suspect with inpatient documentation. possible, probable, suspected diagnoses MUST be documented at the time of discharge  Reviewed: additional documentation in the medical record  Thank You,  Andy Gauss RN  Clinical Documentation Specialist:  Pager [phone] E-mail [email or web address]   Health Information Management [place]

## 2011-08-21 NOTE — Progress Notes (Signed)
Physical Therapy Treatment Patient Details Name: David Davidson MRN: [id number] DOB: [birth year] Today's Date: 08/21/2011  PT Assessment/Plan  PT - Assessment/Plan Comments on Treatment Session: pt improving.  pt able to walk to bathroom for small BM.  Pt then stood and walked around in room before returning to  bed.  Pt able tot maneuver RW with not loss of balance PT Plan: Discharge plan remains appropriate PT Frequency: Min 3X/week Follow Up Recommendations: Home health PT Equipment Recommended: None recommended by PT PT Goals  Acute Rehab PT Goals PT Goal: Rolling Supine to Right Side - Progress: Progressing toward goal PT Goal: Rolling Supine to Left Side - Progress: Progressing toward goal PT Goal: Sit to Supine/Side - Progress: Progressing toward goal PT Goal: Sit to Stand - Progress: Progressing toward goal PT Goal: Stand to Sit - Progress: Progressing toward goal PT Goal: Ambulate - Progress: Progressing toward goal  PT Treatment Precautions/Restrictions  Precautions Precautions: Other (comment) Precaution Comments: David Davidson Required Braces or Orthoses: No Restrictions Weight Bearing Restrictions: No Mobility (including Balance) Bed Mobility Bed Mobility: Yes Sit to Supine: 5: Supervision (pt needs extra time) Sit to Supine - Details (indicate cue type and reason): pt is slow to perform activity, but is able to get legs up onto bed Transfers Transfers: Yes Sit to Stand: 5: Supervision;With upper extremity assist Stand to Sit: 5: Supervision;With upper extremity assist Ambulation/Gait Ambulation/Gait: Yes Ambulation/Gait Assistance: 5: Supervision Ambulation/Gait Assistance Details (indicate cue type and reason): pt using RW, assist needed to clear path of obstacles with verbal cues to stand erect Ambulation Distance (Feet): 50 Feet Assistive device: Rolling walker Gait Pattern: Step-through pattern Gait velocity: decreased Stairs: No Wheelchair Mobility Wheelchair  Mobility: No  Posture/Postural Control Posture/Postural Control: No significant limitations Balance Balance Assessed: No Exercise    End of Session PT - End of Session Equipment Utilized During Treatment: Gait belt Activity Tolerance: Patient limited by fatigue Patient left: in bed Nurse Communication: Mobility status for transfers General Behavior During Session: [facility] for tasks performed Cognition: [facility] for tasks performed  David Davidson 08/21/2011, 1:01 PM

## 2011-08-21 NOTE — Progress Notes (Signed)
Pt discharged in stable condition.  Pt has selected [facility] RN for INR checks, teaching and observation. mp

## 2011-08-21 NOTE — Consult Note (Signed)
WOC consult Note Reason for Consult: Consult requested for buttocks  Wound type: Generalized erythemia to bilat buttocks and sacrum.  This is blanchable when touched. Pt states he sits in a recliner chair for prolonged periods of time when at home and has been having frequent diarrhea stools, he states. Pressure Ulcer POA: Yes Measurement: Fissure between buttocks 1.5X.1X.1cm, appears consistent with constant moisture. Right buttock with .2X.2cm dry scab, consistent with frequent friction or previous stage 2 wound.  No open wounds noted.  Pt preparing to d/c home.  Discussed use of barrier cream to protect skin and relieving pressure to buttocks periodically when in chair.  Will not plan to follow further unless re-consulted.  [address] RN, MSN, [employer]  [phone]

## 2011-08-22 LAB — CULTURE, BLOOD (ROUTINE X 2)
Culture  Setup Time: 201303060905
Culture  Setup Time: 201303060905
Culture: NO GROWTH

## 2011-08-22 LAB — GLUCOSE, CAPILLARY
Glucose-Capillary: 104 mg/dL — ABNORMAL HIGH (ref 70–99)
Glucose-Capillary: 201 mg/dL — ABNORMAL HIGH (ref 70–99)

## 2011-09-08 ENCOUNTER — Other Ambulatory Visit: Payer: Self-pay

## 2011-09-08 ENCOUNTER — Inpatient Hospital Stay (HOSPITAL_COMMUNITY)
Admission: EM | Admit: 2011-09-08 | Discharge: 2011-09-13 | DRG: 372 | Disposition: A | Discharge status: Home Health | Payer: Medicare Other | Attending: Internal Medicine | Admitting: Internal Medicine

## 2011-09-08 ENCOUNTER — Encounter (HOSPITAL_COMMUNITY): Payer: Self-pay | Admitting: Internal Medicine

## 2011-09-08 DIAGNOSIS — D649 Anemia, unspecified: Secondary | ICD-10-CM | POA: Diagnosis present

## 2011-09-08 DIAGNOSIS — D696 Thrombocytopenia, unspecified: Secondary | ICD-10-CM | POA: Diagnosis present

## 2011-09-08 DIAGNOSIS — I10 Essential (primary) hypertension: Secondary | ICD-10-CM

## 2011-09-08 DIAGNOSIS — Z79899 Other long term (current) drug therapy: Secondary | ICD-10-CM

## 2011-09-08 DIAGNOSIS — IMO0002 Reserved for concepts with insufficient information to code with codable children: Secondary | ICD-10-CM | POA: Diagnosis present

## 2011-09-08 DIAGNOSIS — E039 Hypothyroidism, unspecified: Secondary | ICD-10-CM

## 2011-09-08 DIAGNOSIS — E1165 Type 2 diabetes mellitus with hyperglycemia: Secondary | ICD-10-CM

## 2011-09-08 DIAGNOSIS — A419 Sepsis, unspecified organism: Secondary | ICD-10-CM | POA: Diagnosis present

## 2011-09-08 DIAGNOSIS — Z66 Do not resuscitate: Secondary | ICD-10-CM | POA: Diagnosis present

## 2011-09-08 DIAGNOSIS — E876 Hypokalemia: Secondary | ICD-10-CM | POA: Diagnosis present

## 2011-09-08 DIAGNOSIS — R6521 Severe sepsis with septic shock: Secondary | ICD-10-CM | POA: Diagnosis present

## 2011-09-08 DIAGNOSIS — N400 Enlarged prostate without lower urinary tract symptoms: Secondary | ICD-10-CM | POA: Diagnosis present

## 2011-09-08 DIAGNOSIS — R531 Weakness: Secondary | ICD-10-CM

## 2011-09-08 DIAGNOSIS — E860 Dehydration: Secondary | ICD-10-CM | POA: Diagnosis present

## 2011-09-08 DIAGNOSIS — Z87891 Personal history of nicotine dependence: Secondary | ICD-10-CM

## 2011-09-08 DIAGNOSIS — Z951 Presence of aortocoronary bypass graft: Secondary | ICD-10-CM

## 2011-09-08 DIAGNOSIS — E861 Hypovolemia: Secondary | ICD-10-CM | POA: Diagnosis present

## 2011-09-08 DIAGNOSIS — Z886 Allergy status to analgesic agent status: Secondary | ICD-10-CM

## 2011-09-08 DIAGNOSIS — E785 Hyperlipidemia, unspecified: Secondary | ICD-10-CM | POA: Diagnosis present

## 2011-09-08 DIAGNOSIS — I4891 Unspecified atrial fibrillation: Secondary | ICD-10-CM | POA: Diagnosis present

## 2011-09-08 DIAGNOSIS — R197 Diarrhea, unspecified: Secondary | ICD-10-CM | POA: Diagnosis present

## 2011-09-08 DIAGNOSIS — R6510 Systemic inflammatory response syndrome (SIRS) of non-infectious origin without acute organ dysfunction: Secondary | ICD-10-CM | POA: Diagnosis present

## 2011-09-08 DIAGNOSIS — D72829 Elevated white blood cell count, unspecified: Secondary | ICD-10-CM

## 2011-09-08 DIAGNOSIS — A0471 Enterocolitis due to Clostridium difficile, recurrent: Secondary | ICD-10-CM | POA: Diagnosis present

## 2011-09-08 DIAGNOSIS — Z8551 Personal history of malignant neoplasm of bladder: Secondary | ICD-10-CM

## 2011-09-08 DIAGNOSIS — A0472 Enterocolitis due to Clostridium difficile, not specified as recurrent: Secondary | ICD-10-CM

## 2011-09-08 DIAGNOSIS — I252 Old myocardial infarction: Secondary | ICD-10-CM

## 2011-09-08 DIAGNOSIS — I480 Paroxysmal atrial fibrillation: Secondary | ICD-10-CM

## 2011-09-08 DIAGNOSIS — Z7982 Long term (current) use of aspirin: Secondary | ICD-10-CM

## 2011-09-08 DIAGNOSIS — I509 Heart failure, unspecified: Secondary | ICD-10-CM | POA: Diagnosis present

## 2011-09-08 DIAGNOSIS — I2510 Atherosclerotic heart disease of native coronary artery without angina pectoris: Secondary | ICD-10-CM | POA: Diagnosis present

## 2011-09-08 LAB — COMPREHENSIVE METABOLIC PANEL
AST: 22 U/L (ref 0–37)
Alkaline Phosphatase: 50 U/L (ref 39–117)
BUN: 15 mg/dL (ref 6–23)
CO2: 24 mEq/L (ref 19–32)
Chloride: 107 mEq/L (ref 96–112)
Creatinine, Ser: 0.72 mg/dL (ref 0.50–1.35)
GFR calc non Af Amer: 75 mL/min — ABNORMAL LOW (ref >90)
Potassium: 4.1 mEq/L (ref 3.5–5.1)
Total Bilirubin: 0.7 mg/dL (ref 0.3–1.2)

## 2011-09-08 LAB — CBC
HCT: 37.1 % — ABNORMAL LOW (ref 39.0–52.0)
Hemoglobin: 12.4 g/dL — ABNORMAL LOW (ref 13.0–17.0)
WBC: 11.8 10*3/uL — ABNORMAL HIGH (ref 4.0–10.5)

## 2011-09-08 LAB — GLUCOSE, CAPILLARY
Glucose-Capillary: 146 mg/dL — ABNORMAL HIGH (ref 70–99)
Glucose-Capillary: 137 mg/dL — ABNORMAL HIGH (ref 70–99)

## 2011-09-08 LAB — DIFFERENTIAL
Basophils Absolute: 0 10*3/uL (ref 0.0–0.1)
Lymphocytes Relative: 8 % — ABNORMAL LOW (ref 12–46)
Monocytes Absolute: 1.1 10*3/uL — ABNORMAL HIGH (ref 0.1–1.0)
Monocytes Relative: 9 % (ref 3–12)
Neutro Abs: 9.7 10*3/uL — ABNORMAL HIGH (ref 1.7–7.7)
Neutrophils Relative %: 83 % — ABNORMAL HIGH (ref 43–77)

## 2011-09-08 LAB — POCT I-STAT TROPONIN I: Troponin i, poc: 0.01 ng/mL (ref 0.00–0.08)

## 2011-09-08 LAB — TSH: TSH: 0.295 u[IU]/mL — ABNORMAL LOW (ref 0.350–4.500)

## 2011-09-08 LAB — PROTIME-INR: INR: 2.49 — ABNORMAL HIGH (ref 0.00–1.49)

## 2011-09-08 MED ORDER — SACCHAROMYCES BOULARDII 250 MG PO CAPS
250.0000 mg | ORAL_CAPSULE | Freq: Two times a day (BID) | ORAL | Status: DC
  Filled 2011-09-08: qty 1

## 2011-09-08 MED ORDER — SACCHAROMYCES BOULARDII 250 MG PO CAPS
500.0000 mg | ORAL_CAPSULE | Freq: Two times a day (BID) | ORAL | Status: DC
  Administered 2011-09-08 – 2011-09-13 (×10): 500 mg via ORAL
  Filled 2011-09-08 (×11): qty 2

## 2011-09-08 MED ORDER — ALBUTEROL SULFATE (5 MG/ML) 0.5% IN NEBU: 2.5000 mg | INHALATION_SOLUTION | RESPIRATORY_TRACT | Status: DC | PRN

## 2011-09-08 MED ORDER — POLYSACCHARIDE IRON COMPLEX 150 MG PO CAPS
150.0000 mg | ORAL_CAPSULE | Freq: Every day | ORAL | Status: DC
  Administered 2011-09-08 – 2011-09-13 (×6): 150 mg via ORAL
  Filled 2011-09-08 (×6): qty 1

## 2011-09-08 MED ORDER — SODIUM CHLORIDE 0.9 % IJ SOLN
3.0000 mL | Freq: Two times a day (BID) | INTRAMUSCULAR | Status: DC
  Administered 2011-09-08 – 2011-09-12 (×7): 3 mL via INTRAVENOUS

## 2011-09-08 MED ORDER — ASPIRIN 81 MG PO TABS: 81.0000 mg | ORAL_TABLET | ORAL | Status: DC

## 2011-09-08 MED ORDER — GEMFIBROZIL 600 MG PO TABS
600.0000 mg | ORAL_TABLET | Freq: Two times a day (BID) | ORAL | Status: DC
  Administered 2011-09-08 – 2011-09-13 (×10): 600 mg via ORAL
  Filled 2011-09-08 (×13): qty 1

## 2011-09-08 MED ORDER — GLUCERNA SHAKE PO LIQD
237.0000 mL | Freq: Three times a day (TID) | ORAL | Status: DC
  Administered 2011-09-08 – 2011-09-13 (×13): 237 mL via ORAL
  Filled 2011-09-08 (×2): qty 237

## 2011-09-08 MED ORDER — ASPIRIN EC 81 MG PO TBEC
81.0000 mg | DELAYED_RELEASE_TABLET | Freq: Every day | ORAL | Status: DC
  Administered 2011-09-08 – 2011-09-13 (×6): 81 mg via ORAL
  Filled 2011-09-08 (×6): qty 1

## 2011-09-08 MED ORDER — VANCOMYCIN 50 MG/ML ORAL SOLUTION
125.0000 mg | Freq: Four times a day (QID) | ORAL | Status: DC
  Administered 2011-09-08 – 2011-09-13 (×20): 125 mg via ORAL
  Filled 2011-09-08 (×27): qty 2.5

## 2011-09-08 MED ORDER — WARFARIN SODIUM 5 MG PO TABS
5.0000 mg | ORAL_TABLET | ORAL | Status: DC
  Administered 2011-09-08: 5 mg via ORAL
  Filled 2011-09-08 (×2): qty 1

## 2011-09-08 MED ORDER — VANCOMYCIN HCL 125 MG PO CAPS: 125.0000 mg | ORAL_CAPSULE | Freq: Once | ORAL | Status: DC

## 2011-09-08 MED ORDER — ONDANSETRON HCL 4 MG/2ML IJ SOLN: 4.0000 mg | Freq: Four times a day (QID) | INTRAMUSCULAR | Status: DC | PRN

## 2011-09-08 MED ORDER — METRONIDAZOLE IN NACL 5-0.79 MG/ML-% IV SOLN
500.0000 mg | Freq: Once | INTRAVENOUS | Status: AC
  Administered 2011-09-08: 500 mg via INTRAVENOUS
  Filled 2011-09-08: qty 100

## 2011-09-08 MED ORDER — WARFARIN - PHARMACIST DOSING INPATIENT
Freq: Every day | Status: DC
  Administered 2011-09-08 – 2011-09-12 (×2)

## 2011-09-08 MED ORDER — ACETAMINOPHEN 325 MG PO TABS
650.0000 mg | ORAL_TABLET | Freq: Four times a day (QID) | ORAL | Status: DC | PRN
  Administered 2011-09-08 – 2011-09-11 (×2): 650 mg via ORAL
  Filled 2011-09-08 (×2): qty 2

## 2011-09-08 MED ORDER — SODIUM CHLORIDE 0.9 % IV SOLN: INTRAVENOUS | Status: AC

## 2011-09-08 MED ORDER — ONDANSETRON HCL 4 MG PO TABS: 4.0000 mg | ORAL_TABLET | Freq: Four times a day (QID) | ORAL | Status: DC | PRN

## 2011-09-08 MED ORDER — WARFARIN SODIUM 7.5 MG PO TABS: 7.5000 mg | ORAL_TABLET | ORAL | Status: DC

## 2011-09-08 MED ORDER — MIRTAZAPINE 7.5 MG PO TABS
7.5000 mg | ORAL_TABLET | Freq: Every day | ORAL | Status: DC
  Administered 2011-09-08 – 2011-09-12 (×5): 7.5 mg via ORAL
  Filled 2011-09-08 (×6): qty 1

## 2011-09-08 MED ORDER — FINASTERIDE 5 MG PO TABS
5.0000 mg | ORAL_TABLET | Freq: Every day | ORAL | Status: DC
  Administered 2011-09-08 – 2011-09-13 (×6): 5 mg via ORAL
  Filled 2011-09-08 (×6): qty 1

## 2011-09-08 MED ORDER — SODIUM CHLORIDE 0.9 % IV SOLN
INTRAVENOUS | Status: DC
  Administered 2011-09-08: 12:00:00 via INTRAVENOUS

## 2011-09-08 MED ORDER — INSULIN ASPART 100 UNIT/ML ~~LOC~~ SOLN
0.0000 [IU] | Freq: Three times a day (TID) | SUBCUTANEOUS | Status: DC
  Administered 2011-09-08: 2 [IU] via SUBCUTANEOUS
  Administered 2011-09-09 (×2): 5 [IU] via SUBCUTANEOUS
  Administered 2011-09-09 – 2011-09-10 (×2): 3 [IU] via SUBCUTANEOUS
  Administered 2011-09-10 – 2011-09-11 (×3): 5 [IU] via SUBCUTANEOUS
  Administered 2011-09-11: 3 [IU] via SUBCUTANEOUS
  Administered 2011-09-12: 5 [IU] via SUBCUTANEOUS
  Administered 2011-09-12 – 2011-09-13 (×3): 2 [IU] via SUBCUTANEOUS
  Administered 2011-09-13: 13:00:00 via SUBCUTANEOUS

## 2011-09-08 MED ORDER — EZETIMIBE 10 MG PO TABS
10.0000 mg | ORAL_TABLET | Freq: Every day | ORAL | Status: DC
  Administered 2011-09-08 – 2011-09-12 (×5): 10 mg via ORAL
  Filled 2011-09-08 (×6): qty 1

## 2011-09-08 MED ORDER — SODIUM CHLORIDE 0.9 % IV BOLUS (SEPSIS)
1000.0000 mL | Freq: Once | INTRAVENOUS | Status: AC
  Administered 2011-09-08: 1000 mL via INTRAVENOUS

## 2011-09-08 MED ORDER — METRONIDAZOLE IN NACL 5-0.79 MG/ML-% IV SOLN
500.0000 mg | Freq: Three times a day (TID) | INTRAVENOUS | Status: DC
  Administered 2011-09-08 – 2011-09-11 (×8): 500 mg via INTRAVENOUS
  Filled 2011-09-08 (×11): qty 100

## 2011-09-08 MED ORDER — LORAZEPAM 0.5 MG PO TABS
0.5000 mg | ORAL_TABLET | Freq: Every evening | ORAL | Status: DC | PRN
  Administered 2011-09-10 – 2011-09-11 (×2): 0.5 mg via ORAL
  Filled 2011-09-08 (×3): qty 1

## 2011-09-08 MED ORDER — LEVOTHYROXINE SODIUM 25 MCG PO TABS
25.0000 ug | ORAL_TABLET | Freq: Every day | ORAL | Status: DC
  Administered 2011-09-09 – 2011-09-13 (×5): 25 ug via ORAL
  Filled 2011-09-08 (×6): qty 1

## 2011-09-08 MED ORDER — ACETAMINOPHEN 650 MG RE SUPP: 650.0000 mg | Freq: Four times a day (QID) | RECTAL | Status: DC | PRN

## 2011-09-08 NOTE — ED Provider Notes (Signed)
History     CSN: [id number]  Arrival date & time 09/08/11  7846   First MD Initiated Contact with Patient 09/08/11 [phone]      Chief Complaint  Patient presents with  . Weakness    (Consider location/radiation/quality/duration/timing/severity/associated sxs/prior treatment) HPI This 78 year old male complains of recurrent diarrhea with generalized weakness over 24 hours now suspicious for Clostridium difficile recurrence. He was diagnosed with Clostridium difficile diarrhea in December it improved, he then had a recurrence about a 3 weeks ago and was admitted for a few days, he felt fine until yesterday and since then he has had about half a dozen episodes of nonbloody diarrhea. He has no abdominal pain no confusion no chest pain cough shortness of breath. He does feel generally weak and dehydrated like his last visit.  He would like to try IV fluids in the emergency department if he gets enough strength then to go back home but he does live home alone. He was having to use a walker but this last week has been using a cane because he has been improving and his strength with physical therapy. Past Medical History  Diagnosis Date  . Myocardial infarct   . Diabetes mellitus   . Hypertension   . CHF (congestive heart failure)   . Coronary artery disease   . Dysrhythmia   . Clostridium difficile diarrhea   . Cancer     Bladder cancer  . Hyperlipidemia   DNR/DNI per records  Past Surgical History  Procedure Date  . Coronary artery bypass graft   . Cholecystectomy   . Cardiac catheterization 10/10/2001  . Cystoscopy, turbt 05/09/2006, 02/19/2006, 04/19/2005, 04/21/2003    Family History  Problem Relation Age of Onset  . Breast cancer Mother   . Coronary artery disease Father     History  Substance Use Topics  . Smoking status: Former Smoker -- 1.0 packs/day    Types: Cigarettes    Quit date: 05/08/1974  . Smokeless tobacco: Never Used  . Alcohol Use: No     occasionally       Review of Systems  Constitutional: Negative for fever.       10 Systems reviewed and are negative for acute change except as noted in the HPI.  HENT: Negative for congestion.   Eyes: Negative for discharge and redness.  Respiratory: Negative for cough and shortness of breath.   Cardiovascular: Negative for chest pain.  Gastrointestinal: Positive for diarrhea. Negative for nausea, vomiting, abdominal pain and blood in stool.  Musculoskeletal: Negative for back pain.  Skin: Negative for rash.  Neurological: Positive for weakness. Negative for dizziness, syncope, light-headedness, numbness and headaches.  Psychiatric/Behavioral:       No behavior change.    Allergies  Morphine and related  Home Medications   No current outpatient prescriptions on file.  BP 107/63  Pulse 112  Temp(Src) 100.8 F (38.2 C) (Oral)  Resp 22  Ht 6\' 1"  (1.854 m)  Wt 216 lb 0.8 oz (98 kg)  BMI 28.50 kg/m2  SpO2 90%  Physical Exam  Nursing note and vitals reviewed. Constitutional:       Awake, alert, nontoxic appearance.  HENT:  Head: Atraumatic.  Eyes: Right eye exhibits no discharge. Left eye exhibits no discharge.  Neck: Neck supple.  Cardiovascular: Regular rhythm.   Murmur heard.      Tachycardic  Pulmonary/Chest: Effort normal and breath sounds normal. No respiratory distress. He has no wheezes. He has no rales. He exhibits no tenderness.  Abdominal: Soft. Bowel sounds are normal. He exhibits no mass. There is no tenderness. There is no rebound and no guarding.  Musculoskeletal: He exhibits no tenderness.       Baseline ROM, no obvious new focal weakness. Trace edema present  Neurological: He is alert.       Mental status and motor strength appears baseline for patient and situation.  Skin: No rash noted.  Psychiatric: He has a normal mood and affect.    ED Course  Procedures (including critical care time) ECG: Sinus tachycardia, ventricular rate 113, right axis deviation,  left posterior block, right bundle branch block, compared to January 2013 now has just over 1 mm ST elevation in lead II only with less than 1 mm ST elevation in lead aVF  [facility] paged for admit [id number] Labs Reviewed  CBC - Abnormal; Notable for the following:    WBC 11.8 (*)    RBC 3.87 (*)    Hemoglobin 12.4 (*)    HCT 37.1 (*)    RDW 16.4 (*)    Platelets 84 (*) PLATELET COUNT CONFIRMED BY SMEAR   All other components within normal limits  DIFFERENTIAL - Abnormal; Notable for the following:    Neutrophils Relative 83 (*)    Neutro Abs 9.7 (*)    Lymphocytes Relative 8 (*)    Monocytes Absolute 1.1 (*)    All other components within normal limits  COMPREHENSIVE METABOLIC PANEL - Abnormal; Notable for the following:    Glucose, Bld 157 (*)    Albumin 3.4 (*)    GFR calc non Af Amer 75 (*)    GFR calc Af Amer 87 (*)    All other components within normal limits  PROTIME-INR - Abnormal; Notable for the following:    Prothrombin Time 27.3 (*)    INR 2.49 (*)    All other components within normal limits  GLUCOSE, CAPILLARY - Abnormal; Notable for the following:    Glucose-Capillary 146 (*)    All other components within normal limits  POCT I-STAT TROPONIN I  CLOSTRIDIUM DIFFICILE BY PCR  URINALYSIS, ROUTINE W REFLEX MICROSCOPIC  TSH  COMPREHENSIVE METABOLIC PANEL  CBC  PROTIME-INR   No results found.   1. Diarrhea   2. Weakness   3. Dehydration       MDM  Patient / Family / Caregiver understand and agree with initial ED impression and plan with expectations set for ED visit.Pt stable in ED with no significant deterioration in condition.Patient / Family / Caregiver informed of clinical course, understand medical decision-making process, and agree with plan.The patient appears reasonably stabilized for admission considering the current resources, flow, and capabilities available in the ED at this time, and I doubt any other [facility] requiring further screening and/or  treatment in the ED prior to admission.        Hurman Horn, MD 09/08/11 [phone]

## 2011-09-08 NOTE — Progress Notes (Signed)
ANTICOAGULATION CONSULT NOTE - Initial Consult  Pharmacy Consult for Coumadin Indication: atrial fibrillation  Allergies  Allergen Reactions  . Morphine And Related Other (See Comments)    Reaction unknown    Patient Measurements:   Heparin Dosing Weight:   Vital Signs: Temp: 100.3 F (37.9 C) (03/29 1415) Temp src: Oral (03/29 1415) BP: 117/59 mmHg (03/29 1245) Pulse Rate: 111  (03/29 1245)  Labs:  Basename 09/08/11 1334 09/08/11 0942  HGB -- 12.4*  HCT -- 37.1*  PLT -- 84*  APTT -- --  LABPROT 27.3* --  INR 2.49* --  HEPARINUNFRC -- --  CREATININE -- 0.72  CKTOTAL -- --  CKMB -- --  TROPONINI -- --   The CrCl is unknown because both a height and weight (above a minimum accepted value) are required for this calculation.  Medical History: Past Medical History  Diagnosis Date  . Myocardial infarct   . Diabetes mellitus   . Hypertension   . CHF (congestive heart failure)   . Coronary artery disease   . Dysrhythmia   . Clostridium difficile diarrhea   . Cancer     Bladder cancer  . Hyperlipidemia     Medications:  Prescriptions prior to admission  Medication Sig Dispense Refill  . acetaminophen (TYLENOL) 500 MG tablet Take 1,000 mg by mouth at bedtime as needed. For pain       . aspirin 81 MG tablet Take 81 mg by mouth every morning.       . cholecalciferol (VITAMIN D) 1000 UNITS tablet Take 2,000 Units by mouth every morning.       . ezetimibe (ZETIA) 10 MG tablet Take 10 mg by mouth at bedtime.       . feeding supplement (GLUCERNA SHAKE) LIQD Take 237 mLs by mouth 3 (three) times daily with meals.      . finasteride (PROSCAR) 5 MG tablet Take 5 mg by mouth every morning.       . fish oil-omega-3 fatty acids 1000 MG capsule Take 1 g by mouth every morning.       . furosemide (LASIX) 20 MG tablet Take 20 mg by mouth daily. Hold for SBP < 100.      Marland Kitchen gemfibrozil (LOPID) 600 MG tablet Take 600 mg by mouth 2 (two) times daily before a meal.        .  glyBURIDE (DIABETA) 5 MG tablet Take 5 mg by mouth.      . hydrocortisone cream 1 % Apply 1 application topically 2 (two) times daily. itchy back rash       . iron polysaccharides (NIFEREX) 150 MG capsule Take 1 capsule (150 mg total) by mouth daily.  30 capsule  0  . levothyroxine (SYNTHROID, LEVOTHROID) 25 MCG tablet Take 25 mcg by mouth daily.       Marland Kitchen LORazepam (ATIVAN) 0.5 MG tablet Take 0.5 mg by mouth at bedtime as needed. For insomnia.      . mirtazapine (REMERON) 7.5 MG tablet Take 7.5 mg by mouth at bedtime.        . saccharomyces boulardii (FLORASTOR) 250 MG capsule Take 250 mg by mouth 2 (two) times daily. Take for 14 days      . Tamsulosin HCl (FLOMAX) 0.4 MG CAPS Take 0.4 mg by mouth every morning. Hold for SBP < 100.      . vancomycin (VANCOCIN) 50 mg/mL oral solution 125 mg, Oral, 4 times daily, First dose on Wed 08/16/11 at 1000, For 7 days  125 mg, Oral, 2 times daily, First dose on Wed 08/23/11 at 1000, For 7 days 125 mg, Oral, Daily, First dose on Wed 08/30/11 at 1000, For 7 days 125 mg, Oral, Every other day, First dose on Wed 09/06/11 at 1000, For 7 days 125 mg, Oral, Every 3 DAYS, First dose on Thu 09/14/11 at 1000, For 14 days  1000 mL  0  . warfarin (COUMADIN) 5 MG tablet Take 5-7.5 mg by mouth every evening. Takes 1 tab every day except Sunday takes 1.5 tab        Assessment: 78 y/o male admitted with recurrent diarrhea and h/o Cdiff 12/12 and 1/13 (recurrent). Last course of abx completed 08/13/11. Added IV Flagyl, po Vanco, Florastor. Tmax 103 and WBC 11.8.  Afib: INR 2.49 on home 7.5mg  Sundays and 5mg  other days. Will monitor for significant INR increases while on Coumadin/Flagyl.  Goal of Therapy:  INR 2-3   Plan:  Continue home Coumadin regimen for now  7.5mg  Sundays and 5mg  other days and adjust prn. Daily PT/INR.  Apollonia Amini S. Merilynn Finland, PharmD, BCPS Clinical Staff Pharmacist Pager [phone]  09/08/2011,3:45 PM

## 2011-09-08 NOTE — H&P (Signed)
David Davidson is an 78 y.o. male.    PCP: [name], MD, MD   Chief Complaint: Diarrhea  HPI: This is a 78 year old, Caucasian male, who has a history of, diabetes, coronary artery disease, atrial fibrillation, BPH, who was discharged from the hospital on March 11 after being treated for C. difficile. Colitis. That was his third episode of C. difficile within the last 4 months. Patient was discharged on a long tapering course of vancomycin. 2 days ago, the vancomycin dose changed from once daily to every other day. So, he did not have a dose to take yesterday. Last night. Patient started having profuse diarrhea. His meeting. Quantity, watery in nature. Denies any blood in the stool. He's had one episode in the emergency department. In about 3-4 episodes overnight. Denies any abdominal pain. Denies any nausea, or vomiting. However, has been very weak and somewhat dizzy. Denies any syncopal episodes. Denies any other symptoms of just of feel the cough or shortness of breath. Denies any dysuria. He did feel warm but did not check his temperature. His weakness persisted, and, so, he decided to come back to the hospital.   Home Medications: Prior to Admission medications   Medication Sig Start Date End Date Taking? Authorizing Provider  acetaminophen (TYLENOL) 500 MG tablet Take 1,000 mg by mouth at bedtime as needed. For pain    Yes Historical Provider, MD  aspirin 81 MG tablet Take 81 mg by mouth every morning.    Yes Historical Provider, MD  cholecalciferol (VITAMIN D) 1000 UNITS tablet Take 2,000 Units by mouth every morning.    Yes Historical Provider, MD  ezetimibe (ZETIA) 10 MG tablet Take 10 mg by mouth at bedtime.    Yes Historical Provider, MD  feeding supplement (GLUCERNA SHAKE) LIQD Take 237 mLs by mouth 3 (three) times daily with meals. 08/21/11  Yes Srikar Cherlynn Kaiser, MD  finasteride (PROSCAR) 5 MG tablet Take 5 mg by mouth every morning.    Yes Historical Provider, MD  fish  oil-omega-3 fatty acids 1000 MG capsule Take 1 g by mouth every morning.    Yes Historical Provider, MD  furosemide (LASIX) 20 MG tablet Take 20 mg by mouth daily. Hold for SBP < 100.   Yes Historical Provider, MD  gemfibrozil (LOPID) 600 MG tablet Take 600 mg by mouth 2 (two) times daily before a meal.     Yes Historical Provider, MD  glyBURIDE (DIABETA) 5 MG tablet Take 5 mg by mouth.   Yes Antonieta Pert, MD  hydrocortisone cream 1 % Apply 1 application topically 2 (two) times daily. itchy back rash    Yes Historical Provider, MD  iron polysaccharides (NIFEREX) 150 MG capsule Take 1 capsule (150 mg total) by mouth daily. 08/21/11 08/20/12 Yes Srikar Cherlynn Kaiser, MD  levothyroxine (SYNTHROID, LEVOTHROID) 25 MCG tablet Take 25 mcg by mouth daily.    Yes Historical Provider, MD  LORazepam (ATIVAN) 0.5 MG tablet Take 0.5 mg by mouth at bedtime as needed. For insomnia.   Yes Historical Provider, MD  mirtazapine (REMERON) 7.5 MG tablet Take 7.5 mg by mouth at bedtime.     Yes Historical Provider, MD  saccharomyces boulardii (FLORASTOR) 250 MG capsule Take 250 mg by mouth 2 (two) times daily. Take for 14 days   Yes Historical Provider, MD  Tamsulosin HCl (FLOMAX) 0.4 MG CAPS Take 0.4 mg by mouth every morning. Hold for SBP < 100.   Yes Historical Provider, MD  vancomycin (VANCOCIN) 50 mg/mL  oral solution 125 mg, Oral, 4 times daily, First dose on Wed 08/16/11 at 1000, For 7 days 125 mg, Oral, 2 times daily, First dose on Wed 08/23/11 at 1000, For 7 days 125 mg, Oral, Daily, First dose on Wed 08/30/11 at 1000, For 7 days 125 mg, Oral, Every other day, First dose on Wed 09/06/11 at 1000, For 7 days 125 mg, Oral, Every 3 DAYS, First dose on Thu 09/14/11 at 1000, For 14 days 08/21/11  Yes Cristal Ford, MD  warfarin (COUMADIN) 5 MG tablet Take 5-7.5 mg by mouth every evening. Takes 1 tab every day except Sunday takes 1.5 tab   Yes Historical Provider, MD    Allergies:  Allergies  Allergen Reactions  . Morphine  And Related Other (See Comments)    Reaction unknown    Past Medical History: Past Medical History  Diagnosis Date  . Myocardial infarct   . Diabetes mellitus   . Hypertension   . CHF (congestive heart failure)   . Coronary artery disease   . Dysrhythmia   . Clostridium difficile diarrhea   . Cancer     Bladder cancer  . Hyperlipidemia     Past Surgical History  Procedure Date  . Coronary artery bypass graft   . Cholecystectomy   . Cardiac catheterization 10/10/2001  . Cystoscopy, turbt 05/09/2006, 02/19/2006, 04/19/2005, 04/21/2003    Social History:  reports that he quit smoking about 37 years ago. His smoking use included Cigarettes. He smoked 1 pack per day. He has never used smokeless tobacco. He reports that he does not drink alcohol or use illicit drugs.  Family History:  Family History  Problem Relation Age of Onset  . Breast cancer Mother   . Coronary artery disease Father     Review of Systems - unobtainable from patient due to acute illness and weakness  Physical Examination Blood pressure 117/59, pulse 111, temperature 103 F (39.4 C), temperature source Oral, resp. rate 20, SpO2 93.00%.  General appearance: alert, cooperative, appears stated age and no distress Head: Normocephalic, without obvious abnormality, atraumatic Eyes: conjunctivae/corneas clear. PERRL, EOM's intact. Throat: dry mm Neck: no adenopathy, no carotid bruit, no JVD, supple, symmetrical, trachea midline and thyroid not enlarged, symmetric, no tenderness/mass/nodules Resp: clear to auscultation bilaterally and decreased air entry at bases Cardio: tachycardic, regular, systolic murmur present, No rub or gallop GI: soft, non-tender; bowel sounds normal; no masses,  no organomegaly Extremities: extremities normal, atraumatic, no cyanosis or edema. Right leg is chronically more swollen than left per son. Pulses: 2+ and symmetric Skin: Skin color, texture, turgor normal. No rashes or  lesions Lymph nodes: Cervical, supraclavicular, and axillary nodes normal. Neurologic: Grossly normal  Laboratory Data: Results for orders placed during the hospital encounter of 09/08/11 (from the past 48 hour(s))  CBC     Status: Abnormal   Collection Time   09/08/11  9:42 AM      Component Value Range Comment   WBC 11.8 (*) 4.0 - 10.5 (K/uL)    RBC 3.87 (*) 4.22 - 5.81 (MIL/uL)    Hemoglobin 12.4 (*) 13.0 - 17.0 (g/dL)    HCT 24.4 (*) 01.0 - 52.0 (%)    MCV 95.9  78.0 - 100.0 (fL)    MCH 32.0  26.0 - 34.0 (pg)    MCHC 33.4  30.0 - 36.0 (g/dL)    RDW 27.2 (*) 53.6 - 15.5 (%)    Platelets 84 (*) 150 - 400 (K/uL) PLATELET COUNT CONFIRMED BY SMEAR  DIFFERENTIAL     Status: Abnormal   Collection Time   09/08/11  9:42 AM      Component Value Range Comment   Neutrophils Relative 83 (*) 43 - 77 (%)    Neutro Abs 9.7 (*) 1.7 - 7.7 (K/uL)    Lymphocytes Relative 8 (*) 12 - 46 (%)    Lymphs Abs 1.0  0.7 - 4.0 (K/uL)    Monocytes Relative 9  3 - 12 (%)    Monocytes Absolute 1.1 (*) 0.1 - 1.0 (K/uL)    Eosinophils Relative 0  0 - 5 (%)    Eosinophils Absolute 0.0  0.0 - 0.7 (K/uL)    Basophils Relative 0  0 - 1 (%)    Basophils Absolute 0.0  0.0 - 0.1 (K/uL)   COMPREHENSIVE METABOLIC PANEL     Status: Abnormal   Collection Time   09/08/11  9:42 AM      Component Value Range Comment   Sodium 141  135 - 145 (mEq/L)    Potassium 4.1  3.5 - 5.1 (mEq/L)    Chloride 107  96 - 112 (mEq/L)    CO2 24  19 - 32 (mEq/L)    Glucose, Bld 157 (*) 70 - 99 (mg/dL)    BUN 15  6 - 23 (mg/dL)    Creatinine, Ser 1.61  0.50 - 1.35 (mg/dL)    Calcium 9.1  8.4 - 10.5 (mg/dL)    Total Protein 6.9  6.0 - 8.3 (g/dL)    Albumin 3.4 (*) 3.5 - 5.2 (g/dL)    AST 22  0 - 37 (U/L) HEMOLYSIS AT THIS LEVEL MAY AFFECT RESULT   ALT 8  0 - 53 (U/L)    Alkaline Phosphatase 50  39 - 117 (U/L)    Total Bilirubin 0.7  0.3 - 1.2 (mg/dL)    GFR calc non Af Amer 75 (*) >90 (mL/min)    GFR calc Af Amer 87 (*) >90 (mL/min)    POCT I-STAT TROPONIN I     Status: Normal   Collection Time   09/08/11 10:00 AM      Component Value Range Comment   Troponin i, poc 0.01  0.00 - 0.08 (ng/mL)    Comment 3              Radiology Reports: No results found.  Electrocardiogram: EKG shows a sinus tachycardia at 115 beats per minute. Axis, appears to be normal. There is mild interventricular conduction delay with right bundle branch block. Compared to an old EKG these changes are stable.  Assessment/Plan  Principal Problem:  *C. difficile colitis Active Problems:  CAD (coronary artery disease)  Paroxysmal a-fib  DM (diabetes mellitus), type 2, uncontrolled with complications  Diarrhea  Dehydration  Anemia  SIRS (systemic inflammatory response syndrome)  Thrombocytopenia   #1 C. difficile. Colitis: This appears to be either his fourth episode or failed treatment of his third episode. We will place him on oral vancomycin four times a day and intravenous Flagyl. He does have some evidence for SIRS. At this time we will go ahead and consult infectious disease. Patient may require alternative agents. We will continue with the florastar. He'll be given IV fluids gently. Stool for c-diff has been sent.  #2 history of diabetes, type II. He'll be put on a sliding scale. We'll hold his oral hypoglycemic agents for now.  #3 history of atrial fibrillation: This appears to be paroxysmal. He is on warfarin. We'll check a PT/INR and  have pharmacy dose the warfarin.  #4 history of coronary artery disease, is stable.  #5 Thrombocytopenia is chronic. Etiology is unclear.  #6 anemia is stable. Continue to monitor.  Patient is a DNR/DNI.  Further management decisions will depend on results of testing and the input from consultants and patient's response to treatment.  [facility]  [facility] Pager [phone]  09/08/2011, 1:52 PM

## 2011-09-08 NOTE — ED Notes (Signed)
Pt arrived via EMS, pt coming from home, generalized weakness since yesterday, diarrhea since yesterday, HX of C-diff

## 2011-09-08 NOTE — Progress Notes (Signed)
David Davidson [id number] Code Status: DNR   Admission Data: 09/08/2011 1540 Attending Provider:   [id number], MD, MD Consults/ Treatment Team:    David Davidson is a 78 y.o. male patient admitted from ED awake, alert - oriented  X 3 - no acute distress noted.  VSS - Blood pressure 107/63, pulse 112, temperature 100.8 F (38.2 C), temperature source Oral, resp. rate 22, height 6\' 1"  (1.854 m), weight 98 kg (216 lb 0.8 oz), SpO2 90.00%.  no c/o shortness of breath, no c/o chest pain. Cardiac tele # [phone], in place, cardiac monitor yields:sinus tachycardia. O2:   none. IV Fluids:  IV in place, occlusive dsg intact without redness, IV cath antecubital left, condition patent and no redness normal saline.  Allergies:   Allergies  Allergen Reactions  . Morphine And Related Other (See Comments)    Reaction unknown     Past Medical History  Diagnosis Date  . Myocardial infarct   . Diabetes mellitus   . Hypertension   . CHF (congestive heart failure)   . Coronary artery disease   . Dysrhythmia   . Clostridium difficile diarrhea   . Cancer     Bladder cancer  . Hyperlipidemia    History:  obtained from the patient. Tobacco/alcohol: denied none  Orientation to room, and floor completed with information packet given to patient/family.  Patient declined safety video at this time.  Admission INP armband ID verified with patient/family, and in place.   SR up x 2, fall assessment complete, with patient and family able to verbalize understanding of risk associated with falls, and verbalized understanding to call nsg before up out of bed.  Call light within reach, patient able to voice, and demonstrate understanding.  Skin, clean-dry- intact without evidence of bruising, or skin tears.   No evidence of skin break down noted on exam.     Will cont to eval and treat per MD orders.  Susann Givens, RN 09/08/2011 1540

## 2011-09-08 NOTE — ED Notes (Signed)
[id number] Ready 

## 2011-09-08 NOTE — Consult Note (Signed)
Infectious disease initial consult note  Chief complaint: recurrent C. Diff diarrhea  Reason for consultation: antibiotic course  HPI: David Davidson is a 78 yo patient with type 2 diabetes mellitus, hypertension, CAD, Afib, BPH. He has had multiple recent hospitalizations for recurrent C. Diff diarrhea.  He was initially hospitalized in 04/2011  for CAP which was complicated by mild respiratory failure and for which he received 10 days of Avelox with complete resolution of the pneumonia. He also received nitrofurantoin for a UTI in November. He was admitted in Dec 2012 for C. dif positive diarrhea which he received PO vancomycin 3 days as an inpatient and 10 days as an outpatient. Urine cultures at that time grew >100,000 colonies of Enterococcus. On discharge his urinary symptoms had resolved. He presented in January 2013 with recurrent diarrhea. He was found to be C. Diff positive and treated with Po vancomycin for 2 weeks. His symptoms resolved until he presented again on March 6 with similar clinical picture. He was started on vancomycin taper for 6 weeks and florastat which resolved the diarrhea. 2 days ago, as part of the taper, the dose was changed form once daily to once every other day. Today he presents with profuse watery non bloody diarrhea, > 10 stools yesterday. He had 3/4 episodes overnight and one in the ED. He was empirically placed on oral vanc 4 times daily and IV flagyl and ID was consulted. C. Diff assay is pending. He also had a fever in the ED.  Review of systems:   Const: denies fevers, weight loss, night sweats CV: denies chest pressure, palpitations, edemas, syncope Resp: denies chest pain, shortness of breath, cough, sputum production, hemoptysis GI: positive for diarrhea and abdominal pain, negative for nausea, vomiting, bloody stools Neuro: denies, arm or leg weakness Skin: denies rash, edema, pruritus  Past Medical History  Diagnosis Date  . Myocardial infarct   .  Diabetes mellitus   . Hypertension   . CHF (congestive heart failure)   . Coronary artery disease   . Dysrhythmia   . Clostridium difficile diarrhea   . Cancer     Bladder cancer  . Hyperlipidemia    Past Surgical History  Procedure Date  . Coronary artery bypass graft   . Cholecystectomy   . Cardiac catheterization 10/10/2001  . Cystoscopy, turbt 05/09/2006, 02/19/2006, 04/19/2005, 04/21/2003   Family History  Problem Relation Age of Onset  . Breast cancer Mother   . Coronary artery disease Father    History   Social History  . Marital Status: Widowed    Spouse Name: N/A    Number of Children: N/A  . Years of Education: N/A   Occupational History  . Not on file.   Social History Main Topics  . Smoking status: Former Smoker -- 1.0 packs/day    Types: Cigarettes    Quit date: 05/08/1974  . Smokeless tobacco: Never Used  . Alcohol Use: No     occasionally  . Drug Use: No  . Sexually Active: No   Other Topics Concern  . Not on file   Social History Narrative  . No narrative on file   Allergies  Allergen Reactions  . Morphine And Related Other (See Comments)    Reaction unknown   No current facility-administered medications on file prior to encounter.   Current Outpatient Prescriptions on File Prior to Encounter  Medication Sig Dispense Refill  . acetaminophen (TYLENOL) 500 MG tablet Take 1,000 mg by mouth at bedtime as needed.  For pain       . aspirin 81 MG tablet Take 81 mg by mouth every morning.       . cholecalciferol (VITAMIN D) 1000 UNITS tablet Take 2,000 Units by mouth every morning.       . ezetimibe (ZETIA) 10 MG tablet Take 10 mg by mouth at bedtime.       . feeding supplement (GLUCERNA SHAKE) LIQD Take 237 mLs by mouth 3 (three) times daily with meals.      . finasteride (PROSCAR) 5 MG tablet Take 5 mg by mouth every morning.       . fish oil-omega-3 fatty acids 1000 MG capsule Take 1 g by mouth every morning.       . furosemide (LASIX) 20 MG  tablet Take 20 mg by mouth daily. Hold for SBP < 100.      Marland Kitchen gemfibrozil (LOPID) 600 MG tablet Take 600 mg by mouth 2 (two) times daily before a meal.        . glyBURIDE (DIABETA) 5 MG tablet Take 5 mg by mouth.      . hydrocortisone cream 1 % Apply 1 application topically 2 (two) times daily. itchy back rash       . iron polysaccharides (NIFEREX) 150 MG capsule Take 1 capsule (150 mg total) by mouth daily.  30 capsule  0  . levothyroxine (SYNTHROID, LEVOTHROID) 25 MCG tablet Take 25 mcg by mouth daily.       Marland Kitchen LORazepam (ATIVAN) 0.5 MG tablet Take 0.5 mg by mouth at bedtime as needed. For insomnia.      . mirtazapine (REMERON) 7.5 MG tablet Take 7.5 mg by mouth at bedtime.        . saccharomyces boulardii (FLORASTOR) 250 MG capsule Take 250 mg by mouth 2 (two) times daily. Take for 14 days      . Tamsulosin HCl (FLOMAX) 0.4 MG CAPS Take 0.4 mg by mouth every morning. Hold for SBP < 100.      . vancomycin (VANCOCIN) 50 mg/mL oral solution 125 mg, Oral, 4 times daily, First dose on Wed 08/16/11 at 1000, For 7 days 125 mg, Oral, 2 times daily, First dose on Wed 08/23/11 at 1000, For 7 days 125 mg, Oral, Daily, First dose on Wed 08/30/11 at 1000, For 7 days 125 mg, Oral, Every other day, First dose on Wed 09/06/11 at 1000, For 7 days 125 mg, Oral, Every 3 DAYS, First dose on Thu 09/14/11 at 1000, For 14 days  1000 mL  0    Objective: Vital signs in last 24 hours: Temp:  [99.2 F (37.3 C)-103 F (39.4 C)] 100.3 F (37.9 C) (03/29 1415) Pulse Rate:  [111-114] 111  (03/29 1245) Resp:  [16-26] 20  (03/29 1245) BP: (113-126)/(50-63) 117/59 mmHg (03/29 1245) SpO2:  [92 %-94 %] 93 % (03/29 1245)  Intake/Output from previous day:   Intake/Output this shift:     Lab Results  Basename 09/08/11 0942  WBC 11.8*  HGB 12.4*  HCT 37.1*  NA 141  K 4.1  CL 107  CO2 24  BUN 15  CREATININE 0.72  GLU --   Liver Panel  Basename 09/08/11 0942  PROT 6.9  ALBUMIN 3.4*  AST 22  ALT 8  ALKPHOS 50    BILITOT 0.7  BILIDIR --  IBILI --   Sedimentation Rate No results found for this basename: ESRSEDRATE in the last 72 hours C-Reactive Protein No results found for this basename: CRP:2 in  the last 72 hours  Microbiology: No results found for this or any previous visit (from the past 240 hour(s)).  Studies/Results: No results found.  Medications:   Current facility-administered medications:0.9 %  sodium chloride infusion, , Intravenous, Continuous, Osvaldo Shipper, MD;  acetaminophen (TYLENOL) suppository 650 mg, 650 mg, Rectal, Q6H PRN, Osvaldo Shipper, MD;  acetaminophen (TYLENOL) tablet 650 mg, 650 mg, Oral, Q6H PRN, Osvaldo Shipper, MD;  albuterol (PROVENTIL) (5 MG/ML) 0.5% nebulizer solution 2.5 mg, 2.5 mg, Nebulization, Q2H PRN, Osvaldo Shipper, MD aspirin EC tablet 81 mg, 81 mg, Oral, Daily, Herby Abraham, PHARMD;  ezetimibe (ZETIA) tablet 10 mg, 10 mg, Oral, QHS, Osvaldo Shipper, MD;  feeding supplement (GLUCERNA SHAKE) liquid 237 mL, 237 mL, Oral, TID WC, Osvaldo Shipper, MD;  finasteride (PROSCAR) tablet 5 mg, 5 mg, Oral, Daily, Osvaldo Shipper, MD;  gemfibrozil (LOPID) tablet 600 mg, 600 mg, Oral, BID AC, Osvaldo Shipper, MD insulin aspart (novoLOG) injection 0-15 Units, 0-15 Units, Subcutaneous, TID WC, Osvaldo Shipper, MD;  iron polysaccharides (NIFEREX) capsule 150 mg, 150 mg, Oral, Daily, Osvaldo Shipper, MD;  levothyroxine (SYNTHROID, LEVOTHROID) tablet 25 mcg, 25 mcg, Oral, QAC breakfast, Osvaldo Shipper, MD;  LORazepam (ATIVAN) tablet 0.5 mg, 0.5 mg, Oral, QHS PRN, Osvaldo Shipper, MD metroNIDAZOLE (FLAGYL) IVPB 500 mg, 500 mg, Intravenous, Once, Hurman Horn, MD, 500 mg at 09/08/11 1256;  metroNIDAZOLE (FLAGYL) IVPB 500 mg, 500 mg, Intravenous, Q8H, Osvaldo Shipper, MD;  mirtazapine (REMERON) tablet 7.5 mg, 7.5 mg, Oral, QHS, Osvaldo Shipper, MD;  ondansetron (ZOFRAN) injection 4 mg, 4 mg, Intravenous, Q6H PRN, Osvaldo Shipper, MD;  ondansetron (ZOFRAN) tablet 4 mg, 4 mg, Oral, Q6H PRN,  Osvaldo Shipper, MD saccharomyces boulardii (FLORASTOR) capsule 250 mg, 250 mg, Oral, BID, Osvaldo Shipper, MD;  sodium chloride 0.9 % bolus 1,000 mL, 1,000 mL, Intravenous, Once, Hurman Horn, MD, 1,000 mL at 09/08/11 0959;  sodium chloride 0.9 % injection 3 mL, 3 mL, Intravenous, Q12H, Osvaldo Shipper, MD;  vancomycin (VANCOCIN) 50 mg/mL oral solution 125 mg, 125 mg, Oral, Q6H, Hurman Horn, MD, 125 mg at 09/08/11 1353 warfarin (COUMADIN) tablet 5 mg, 5 mg, Oral, Custom, Crystal Stillinger Robertson, PHARMD;  warfarin (COUMADIN) tablet 7.5 mg, 7.5 mg, Oral, Q Sun-1800, Crystal Salomon Fick, PHARMD;  Warfarin - Pharmacist Dosing Inpatient, , Does not apply, q1800, Crystal Salomon Fick, PHARMD;  DISCONTD: 0.9 %  sodium chloride infusion, , Intravenous, Continuous, Hurman Horn, MD, Last Rate: 125 mL/hr at 09/08/11 1211 DISCONTD: aspirin tablet 81 mg, 81 mg, Oral, BH-q7a, Osvaldo Shipper, MD;  DISCONTD: vancomycin (VANCOCIN) 125 MG capsule 125 mg, 125 mg, Oral, Once, Hurman Horn, MD  Assessment/Plan: 78 yo with recurrent Cdiff diarrhea.  Rec: At this time, I would restart the 4 times a day vancomycin and follow clinically.   If he responds, he may need a prolonged course and consideration of rifaximin at the end as a "chaser".   -other options will be to use fidaxomicin if he does not respond to the po vancomycin.   -continue Iv flagyl -on Florastor but will change it to 500 mg bid  Dr. Drue Second to follow up tomorrow.    Thanks for the consult for this difficult case   LOS: 0 days    Staci Righter, MD

## 2011-09-09 LAB — BASIC METABOLIC PANEL
BUN: 28 mg/dL — ABNORMAL HIGH (ref 6–23)
Calcium: 8.5 mg/dL (ref 8.4–10.5)
Chloride: 110 mEq/L (ref 96–112)
Creatinine, Ser: 0.82 mg/dL (ref 0.50–1.35)
GFR calc Af Amer: 83 mL/min — ABNORMAL LOW (ref >90)
GFR calc non Af Amer: 71 mL/min — ABNORMAL LOW (ref >90)

## 2011-09-09 LAB — GLUCOSE, CAPILLARY
Glucose-Capillary: 206 mg/dL — ABNORMAL HIGH (ref 70–99)
Glucose-Capillary: 248 mg/dL — ABNORMAL HIGH (ref 70–99)
Glucose-Capillary: 156 mg/dL — ABNORMAL HIGH (ref 70–99)

## 2011-09-09 LAB — URINALYSIS, ROUTINE W REFLEX MICROSCOPIC
Glucose, UA: NEGATIVE mg/dL
Hgb urine dipstick: NEGATIVE
Leukocytes, UA: NEGATIVE
Protein, ur: NEGATIVE mg/dL
Specific Gravity, Urine: 1.023 (ref 1.005–1.030)

## 2011-09-09 LAB — COMPREHENSIVE METABOLIC PANEL
ALT: 6 U/L (ref 0–53)
AST: 15 U/L (ref 0–37)
Albumin: 2.6 g/dL — ABNORMAL LOW (ref 3.5–5.2)
Calcium: 8.3 mg/dL — ABNORMAL LOW (ref 8.4–10.5)
Chloride: 108 mEq/L (ref 96–112)
Creatinine, Ser: 0.76 mg/dL (ref 0.50–1.35)
Sodium: 141 mEq/L (ref 135–145)
Total Bilirubin: 0.6 mg/dL (ref 0.3–1.2)

## 2011-09-09 LAB — CBC
Hemoglobin: 11.6 g/dL — ABNORMAL LOW (ref 13.0–17.0)
MCH: 32.3 pg (ref 26.0–34.0)
MCHC: 33 g/dL (ref 30.0–36.0)
MCV: 98.1 fL (ref 78.0–100.0)
RBC: 3.59 MIL/uL — ABNORMAL LOW (ref 4.22–5.81)

## 2011-09-09 MED ORDER — POTASSIUM CHLORIDE IN NACL 20-0.9 MEQ/L-% IV SOLN
INTRAVENOUS | Status: AC
  Administered 2011-09-09: 16:00:00 via INTRAVENOUS
  Filled 2011-09-09 (×2): qty 1000

## 2011-09-09 MED ORDER — POTASSIUM CHLORIDE CRYS ER 20 MEQ PO TBCR
40.0000 meq | EXTENDED_RELEASE_TABLET | ORAL | Status: AC
  Administered 2011-09-09 (×2): 40 meq via ORAL
  Filled 2011-09-09 (×2): qty 2

## 2011-09-09 NOTE — Progress Notes (Signed)
ID PROGRESS NOTE  Subjective: Feeling better. No loose stools since 4 am. Has good appetite. Denies abdominal pain     . aspirin EC  81 mg Oral Daily  . ezetimibe  10 mg Oral QHS  . feeding supplement  237 mL Oral TID WC  . finasteride  5 mg Oral Daily  . gemfibrozil  600 mg Oral BID AC  . insulin aspart  0-15 Units Subcutaneous TID WC  . iron polysaccharides  150 mg Oral Daily  . levothyroxine  25 mcg Oral QAC breakfast  . metronidazole  500 mg Intravenous Once  . metronidazole  500 mg Intravenous Q8H  . mirtazapine  7.5 mg Oral QHS  . potassium chloride  40 mEq Oral Q4H  . saccharomyces boulardii  500 mg Oral BID  . sodium chloride  3 mL Intravenous Q12H  . vancomycin  125 mg Oral Q6H  . warfarin  5 mg Oral Custom  . warfarin  7.5 mg Oral Q Sun-1800  . Warfarin - Pharmacist Dosing Inpatient   Does not apply q1800  . DISCONTD: aspirin  81 mg Oral BH-q7a  . DISCONTD: saccharomyces boulardii  250 mg Oral BID     Objective: Vital signs in last 24 hours: Temp:  [97.7 F (36.5 C)-100.8 F (38.2 C)] 97.7 F (36.5 C) (03/30 0540) Pulse Rate:  [103-112] 110  (03/30 0540) Resp:  [21-22] 21  (03/30 0540) BP: (100-107)/(56-66) 100/56 mmHg (03/30 0540) SpO2:  [90 %-94 %] 92 % (03/30 0540) Weight:  [183 lb 3.2 oz (83.1 kg)-216 lb 0.8 oz (98 kg)] 183 lb 3.2 oz (83.1 kg) (03/30 0540)  gen = alert and oriented x 4, in NAD Heent= perrla, eomi, no scleral icterus. Moist mucus membranes Pulm= CTAB Cors= nl s1, s2 Abd= soft, decreased bs, nontender  Lab Results  Basename 09/09/11 0630 09/08/11 0942  WBC 12.3* 11.8*  HGB 11.6* 12.4*  HCT 35.2* 37.1*  NA 141 141  K 2.9* 4.1  CL 108 107  CO2 20 24  BUN 21 15  CREATININE 0.76 0.72  GLU -- --   Liver Panel  Basename 09/09/11 0630 09/08/11 0942  PROT 5.8* 6.9  ALBUMIN 2.6* 3.4*  AST 15 22  ALT 6 8  ALKPHOS 43 50  BILITOT 0.6 0.7  BILIDIR -- --  IBILI -- --    Microbiology: Recent Results (from the past 240 hour(s))    CLOSTRIDIUM DIFFICILE BY PCR     Status: Abnormal   Collection Time   09/08/11 11:36 PM      Component Value Range Status Comment   C difficile by pcr POSITIVE (*) NEGATIVE  Final      Assessment/Plan: cdifficile infection = continue with vancomycin 125mg  PO QID and IV metronidazole. If continues to improve, can discontinue Iv metronidazole tomorrow.  Duke Salvia Drue Second MD MPH [facility] [phone]   LOS: 1 day    Judyann Munson 09/09/2011, 1:17 PM

## 2011-09-09 NOTE — Progress Notes (Signed)
Pt had a large liquid dark brown to green stool that leaked around the rectal pouch. Rectal pouch removed and Dr. Barnie Del notified, orders to insert flexiseal obtained and inserted. Pt has had another episode of large liquid dark brown to green stool while inserting flexiseal. Pt currently in bed and flexiseal is draining appropriately. EPC cream applied pt has a stage 1 to sacrum and pt positioned on his right side. Will continue to monitor and assist as needed. [facility]

## 2011-09-09 NOTE — Evaluation (Signed)
Occupational Therapy Evaluation Patient Details Name: David Davidson MRN: [id number] DOB: Jan 25, 1911 Today's Date: 09/09/2011  Problem List:  Patient Active Problem List  Diagnoses  . CAD (coronary artery disease)  . Paroxysmal a-fib  . DM (diabetes mellitus), type 2, uncontrolled with complications  . Hypertension  . S/P CABG (coronary artery bypass graft)  . Hyperlipidemia  . Hypothyroidism  . Diarrhea  . Weakness generalized  . Dehydration  . C. difficile colitis  . Hypokalemia  . Leukocytosis  . Anemia  . SIRS (systemic inflammatory response syndrome)  . Thrombocytopenia    Past Medical History:  Past Medical History  Diagnosis Date  . Myocardial infarct   . Diabetes mellitus   . Hypertension   . CHF (congestive heart failure)   . Coronary artery disease   . Dysrhythmia   . Clostridium difficile diarrhea   . Cancer     Bladder cancer  . Hyperlipidemia    Past Surgical History:  Past Surgical History  Procedure Date  . Coronary artery bypass graft   . Cholecystectomy   . Cardiac catheterization 10/10/2001  . Cystoscopy, turbt 05/09/2006, 02/19/2006, 04/19/2005, 04/21/2003    OT Assessment/Plan/Recommendation OT Assessment Clinical Impression Statement: Pt admitted with 4+ bout with C-diff since December and presents with below problem list. Pt currently lives at home alone and receives assist from son and daughter-in-law who live next door. At this time, pt is unsafe to return home alone and will require ST-SNF stay prior to returning home. If pt progressing well enough acutely, may be able to d/c home with HHOT and intermittent assist/supervision from family. Will benefit from skilled OT in the acute setting to maximize I with ADL and ADL mobility. OT Recommendation/Assessment: Patient will need skilled OT in the acute care venue OT Problem List: Decreased strength;Decreased activity tolerance OT Therapy Diagnosis : Generalized weakness OT Plan OT Frequency: Min  2X/week OT Treatment/Interventions: Self-care/ADL training;Therapeutic activities OT Recommendation Follow Up Recommendations: Skilled nursing facility Equipment Recommended: None recommended by OT Individuals Consulted Consulted and Agree with Results and Recommendations: Patient OT Goals Acute Rehab OT Goals OT Goal Formulation: With patient Time For Goal Achievement: 2 weeks ADL Goals Pt Will Perform Grooming: with modified independence;Standing at sink ADL Goal: Grooming - Progress: Goal set today Pt Will Perform Lower Body Bathing: with modified independence;Sitting, chair;Sit to stand in shower ADL Goal: Lower Body Bathing - Progress: Goal set today Pt Will Perform Lower Body Dressing: with modified independence;Sit to stand from chair ADL Goal: Lower Body Dressing - Progress: Goal set today Pt Will Transfer to Toilet: with modified independence;Ambulation;with DME;3-in-1 ADL Goal: Toilet Transfer - Progress: Goal set today Pt Will Perform Toileting - Clothing Manipulation: Independently;Standing ADL Goal: Toileting - Clothing Manipulation - Progress: Goal set today Pt Will Perform Toileting - Hygiene: Independently;Standing at 3-in-1/toilet;Sitting on 3-in-1 or toilet ADL Goal: Toileting - Hygiene - Progress: Goal set today Pt Will Perform Tub/Shower Transfer: Shower transfer;with supervision;Shower seat with back ADL Goal: Web designer - Progress: Goal set today  OT Evaluation Precautions/Restrictions  Precautions Precautions: Fall Precaution Comments: Contact for C-diff Restrictions Weight Bearing Restrictions: No Prior Functioning Home Living Lives With: Alone Receives Help From: Family;Other (Comment) Type of Home: House Home Layout: One level Home Access: Stairs to enter Entrance Stairs-Rails: Right;Left;Can reach both Entrance Stairs-Number of Steps: 3 Bathroom Shower/Tub: Walk-in shower;Door [employer] Toilet: Handicapped height Bathroom Accessibility:  Yes How Accessible: Accessible via walker Home Adaptive Equipment: Shower chair with back;Straight cane;Walker - four wheeled Additional Comments:  Son and dtr in law live next door and provide housekeeping and food/meals for pt Prior Function Level of Independence: Independent with basic ADLs;Requires assistive device for independence;Needs assistance with homemaking Meal Prep: Minimal Homemaking Assistance Comments: daughter in law assists with all cleaning. Able to Take Stairs?: Yes Driving: Yes Vocation: Retired ADL ADL Eating/Feeding: Not assessed Grooming: Performed;Wash/dry face;Set up Grooming Details (indicate cue type and reason): unable to tolerate standing at sink Where Assessed - Grooming: Sitting, chair Upper Body Bathing: Simulated;Set up;Supervision/safety Where Assessed - Upper Body Bathing: Sitting, chair Lower Body Bathing: Simulated;Minimal assistance Lower Body Bathing Details (indicate cue type and reason): Mod A to stand and Min A in standing at pt with reports of dizziness Where Assessed - Lower Body Bathing: Sit to stand from bed Upper Body Dressing: Performed;Set up Upper Body Dressing Details (indicate cue type and reason): gown Where Assessed - Upper Body Dressing: Sitting, bed Lower Body Dressing: Performed;Supervision/safety;Set up Lower Body Dressing Details (indicate cue type and reason): doffed and donned socks Where Assessed - Lower Body Dressing: Sitting, bed Toilet Transfer: Simulated;Moderate assistance Toilet Transfer Details (indicate cue type and reason): pt with dizziness upon standing therefore only able to do stand pivot EOB to chair  Toilet Transfer Method: Stand pivot Toileting - Clothing Manipulation: Not assessed Toileting - Hygiene: Not assessed Tub/Shower Transfer: Not assessed Ambulation Related to ADLs: Unable to take any steps as pt with dizziness ADL Comments: Pt easily fatigued Vision/Perception  Vision - History Baseline  Vision: Wears glasses all the time Cognition Cognition Arousal/Alertness: Awake/alert Overall Cognitive Status: Appears within functional limits for tasks assessed Sensation/Coordination Sensation Light Touch: Appears Intact Coordination Gross Motor Movements are Fluid and Coordinated: Yes Fine Motor Movements are Fluid and Coordinated: Yes Extremity Assessment RUE Assessment RUE Assessment: Within Functional Limits LUE Assessment LUE Assessment: Within Functional Limits Mobility  Bed Mobility Rolling Right: 5: Supervision;With rail Right Sidelying to Sit: 4: Min assist;With rails;HOB flat Transfers Sit to Stand: 3: Mod assist;From bed Stand to Sit: 4: Min assist;To chair/3-in-1;With armrests Stand to Sit Details: VC for hand placement End of Session OT - End of Session Equipment Utilized During Treatment: Gait belt Activity Tolerance: Patient limited by fatigue Patient left: in chair;with call bell in reach (with chair alarm) Nurse Communication: Mobility status for transfers General Behavior During Session: [facility] for tasks performed Cognition: [facility] for tasks performed   Lisette Mancebo 09/09/2011, 11:38 AM

## 2011-09-09 NOTE — Progress Notes (Signed)
CRITICAL VALUE ALERT  Critical value received:  + C Diff  Date of notification:  09/09/11  Time of notification:  0850  Critical value read back:yes  Nurse who received alert:  Yoceline Bazar, RN  MD notified (1st page):  Dr. Osvaldo Shipper  Time of first page:  [phone]  MD notified (2nd page):  Time of second page:  Responding MD:  Dr. Rito Ehrlich  Time MD responded:  [phone]

## 2011-09-09 NOTE — Progress Notes (Signed)
ANTICOAGULATION CONSULT NOTE - Follow up Consult  Pharmacy Consult for Coumadin Indication: atrial fibrillation  Allergies  Allergen Reactions  . Morphine And Related Other (See Comments)    Reaction unknown    Patient Measurements: Height: 6\' 1"  (185.4 cm) Weight: 183 lb 3.2 oz (83.1 kg) IBW/kg (Calculated) : 79.9    Vital Signs: Temp: 97.7 F (36.5 C) (03/30 0540) Temp src: Oral (03/30 0540) BP: 100/56 mmHg (03/30 0540) Pulse Rate: 110  (03/30 0540)  Labs:  Basename 09/09/11 0630 09/08/11 1334 09/08/11 0942  HGB 11.6* -- 12.4*  HCT 35.2* -- 37.1*  PLT 72* -- 84*  APTT -- -- --  LABPROT 35.7* 27.3* --  INR 3.51* 2.49* --  HEPARINUNFRC -- -- --  CREATININE 0.76 -- 0.72  CKTOTAL -- -- --  CKMB -- -- --  TROPONINI -- -- --   Estimated Creatinine Clearance: 58.3 ml/min (by C-G formula based on Cr of 0.76).    Assessment: 78 y/o male admitted with recurrent diarrhea and h/o Cdiff 12/12 and 1/13 (recurrent). Last course of abx completed 08/13/11. Added IV Flagyl, po Vanco, Florastor. Tmax 103 and WBC 12.3.  Afib: INR 3.51 on home 7.5mg  Sundays and 5mg  other days.  Goal of Therapy:  INR 2-3   Plan:  Hold Coumadin for increased INR Celedonio Miyamoto, PharmD, BCPS Clinical Pharmacist Pager [phone]   09/09/2011,1:15 PM

## 2011-09-09 NOTE — Progress Notes (Signed)
PCP: [name], MD, MD  Brief HPI:  This is a 78 year old, Caucasian male, who has a history of, diabetes, coronary artery disease, atrial fibrillation, BPH, who was discharged from the hospital on March 11 after being treated for C. difficile. Colitis. That was his third episode of C. difficile within the last 4 months. Patient was discharged on a long tapering course of vancomycin. 2 days ago, the vancomycin dose changed from once daily to every other day. So, he did not have a dose to take yesterday. Last night. Patient started having profuse diarrhea. His meeting. Quantity, watery in nature. Denies any blood in the stool. He's had one episode in the emergency department. In about 3-4 episodes overnight. Denies any abdominal pain. Denies any nausea, or vomiting. However, has been very weak and somewhat dizzy. Denies any syncopal episodes. Denies any other symptoms of just of feel the cough or shortness of breath. Denies any dysuria. He did feel warm but did not check his temperature. His weakness persisted, and, so, he decided to come back to the hospital.   Past medical history:  Past Medical History   Diagnosis  Date   .  Myocardial infarct    .  Diabetes mellitus    .  Hypertension    .  CHF (congestive heart failure)    .  Coronary artery disease    .  Dysrhythmia    .  Clostridium difficile diarrhea    .  Cancer      Bladder cancer   .  Hyperlipidemia      Consultants: ID  Procedures: None  Subjective: Patient feels some better. Still with loose stools. Denies abdominal pain or nausea. Tolerating diet.  Objective: Vital signs in last 24 hours: Temp:  [97.7 F (36.5 C)-100.8 F (38.2 C)] 97.7 F (36.5 C) (03/30 1400) Pulse Rate:  [103-112] 108  (03/30 1400) Resp:  [20-22] 20  (03/30 1400) BP: (100-107)/(56-66) 100/60 mmHg (03/30 1400) SpO2:  [90 %-95 %] 95 % (03/30 1400) Weight:  [83.1 kg (183 lb 3.2 oz)-98 kg (216 lb 0.8 oz)] 83.1 kg (183 lb 3.2 oz) (03/30  0540) Weight change:  Last BM Date: 09/09/11 (rectal pouch in place)  Intake/Output from previous day: 03/29 0701 - 03/30 0700 In: 363 [P.O.:360; I.V.:3] Out: 425 [Urine:425] Intake/Output this shift: Total I/O In: 600 [P.O.:600] Out: -   General appearance: alert, cooperative and no distress Head: Normocephalic, without obvious abnormality, atraumatic Resp: clear to auscultation bilaterally Cardio: regular rate and rhythm, S1, S2 normal, no murmur, click, rub or gallop GI: soft, non-tender; bowel sounds normal; no masses,  no organomegaly Extremities: extremities normal, atraumatic, no cyanosis or edema Pulses: 2+ and symmetric Skin: Skin color, texture, turgor normal. No rashes or lesions Neurologic: Grossly normal  Lab Results:  Basename 09/09/11 0630 09/08/11 0942  WBC 12.3* 11.8*  HGB 11.6* 12.4*  HCT 35.2* 37.1*  PLT 72* 84*   BMET  Basename 09/09/11 0630 09/08/11 0942  NA 141 141  K 2.9* 4.1  CL 108 107  CO2 20 24  GLUCOSE 188* 157*  BUN 21 15  CREATININE 0.76 0.72  CALCIUM 8.3* 9.1  ALT 6 8    Studies/Results: No results found.  Medications:  Scheduled:   . aspirin EC  81 mg Oral Daily  . ezetimibe  10 mg Oral QHS  . feeding supplement  237 mL Oral TID WC  . finasteride  5 mg Oral Daily  . gemfibrozil  600 mg Oral BID  AC  . insulin aspart  0-15 Units Subcutaneous TID WC  . iron polysaccharides  150 mg Oral Daily  . levothyroxine  25 mcg Oral QAC breakfast  . metronidazole  500 mg Intravenous Q8H  . mirtazapine  7.5 mg Oral QHS  . potassium chloride  40 mEq Oral Q4H  . saccharomyces boulardii  500 mg Oral BID  . sodium chloride  3 mL Intravenous Q12H  . vancomycin  125 mg Oral Q6H  . Warfarin - Pharmacist Dosing Inpatient   Does not apply q1800  . DISCONTD: aspirin  81 mg Oral BH-q7a  . DISCONTD: saccharomyces boulardii  250 mg Oral BID  . DISCONTD: warfarin  5 mg Oral Custom  . DISCONTD: warfarin  7.5 mg Oral Q Sun-1800     Assessment/Plan:  Principal Problem:  *C. difficile colitis Active Problems:  CAD (coronary artery disease)  Paroxysmal a-fib  DM (diabetes mellitus), type 2, uncontrolled with complications  Diarrhea  Dehydration  Anemia  SIRS (systemic inflammatory response syndrome)  Thrombocytopenia    #1 C. difficile Colitis: Stable and maybe slightly better. Still with diarrhea. Continue Vanc and IV flagyl. Appreciate ID input. This appears to be either his fourth episode or failed treatment of his third episode. ID considering Rifaximin. Continue florastar. Continue IV fluids gently.   #2 history of diabetes, type II. He'll be put on a sliding scale. Continue to hold his oral hypoglycemic agents for now. Consider reinitiating if CBG remains elevated.  #3 history of atrial fibrillation: This appears to be paroxysmal. He is on warfarin. We'll check a PT/INR and have pharmacy dose the warfarin.   #4 history of coronary artery disease, is stable.   #5 Thrombocytopenia is chronic. Etiology is unclear.   #6 anemia is stable. Continue to monitor.   #7 Hypokalemia: replete. Check Mg.  Patient is a DNR/DNI.   PT/OT   LOS: 1 day   [facility] Pager [phone] 09/09/2011, 2:57 PM

## 2011-09-09 NOTE — Evaluation (Signed)
Physical Therapy Evaluation Patient Details Name: David Davidson MRN: [id number] DOB: 07/13/1910 Today's Date: 09/09/2011  Problem List:  Patient Active Problem List  Diagnoses  . CAD (coronary artery disease)  . Paroxysmal a-fib  . DM (diabetes mellitus), type 2, uncontrolled with complications  . Hypertension  . S/P CABG (coronary artery bypass graft)  . Hyperlipidemia  . Hypothyroidism  . Diarrhea  . Weakness generalized  . Dehydration  . C. difficile colitis  . Hypokalemia  . Leukocytosis  . Anemia  . SIRS (systemic inflammatory response syndrome)  . Thrombocytopenia    Past Medical History:  Past Medical History  Diagnosis Date  . Myocardial infarct   . Diabetes mellitus   . Hypertension   . CHF (congestive heart failure)   . Coronary artery disease   . Dysrhythmia   . Clostridium difficile diarrhea   . Cancer     Bladder cancer  . Hyperlipidemia    Past Surgical History:  Past Surgical History  Procedure Date  . Coronary artery bypass graft   . Cholecystectomy   . Cardiac catheterization 10/10/2001  . Cystoscopy, turbt 05/09/2006, 02/19/2006, 04/19/2005, 04/21/2003    PT Assessment/Plan/Recommendation PT Assessment Clinical Impression Statement: Patient admitted with continued C-diff.  Patient was living alone prior to admission with family available close by.  Patient limited today by fatigue and decreased endurance.  Will benefit from PT to return to baseline activity for ultimate return home alone. PT Recommendation/Assessment: Patient will need skilled PT in the acute care venue PT Problem List: Decreased activity tolerance;Decreased mobility PT Therapy Diagnosis : Difficulty walking;Generalized weakness PT Plan PT Frequency: Min 3X/week PT Treatment/Interventions: Gait training;Functional mobility training;Therapeutic activities;Therapeutic exercise PT Recommendation Follow Up Recommendations: Home health PT Equipment Recommended: Rolling walker with  5" wheels PT Goals  Acute Rehab PT Goals PT Goal Formulation: With patient Time For Goal Achievement: 7 days Pt will go Supine/Side to Sit: Independently PT Goal: Supine/Side to Sit - Progress: Goal set today Pt will go Sit to Supine/Side: Independently PT Goal: Sit to Supine/Side - Progress: Goal set today Pt will go Sit to Stand: with modified independence PT Goal: Sit to Stand - Progress: Goal set today Pt will go Stand to Sit: with modified independence PT Goal: Stand to Sit - Progress: Goal set today Pt will Ambulate: 51 - 150 feet;with modified independence;with least restrictive assistive device PT Goal: Ambulate - Progress: Goal set today Pt will Perform Home Exercise Program: Independently PT Goal: Perform Home Exercise Program - Progress: Goal set today  PT Evaluation Precautions/Restrictions  Precautions Precautions: Fall Precaution Comments: contace for c-diff Restrictions Weight Bearing Restrictions: No Prior Functioning  Home Living Lives With: Alone Receives Help From: Family;Other (Comment) Type of Home: House Home Layout: One level Home Access: Stairs to enter Entrance Stairs-Rails: Right;Left;Can reach both Entrance Stairs-Number of Steps: 3 Bathroom Shower/Tub: Walk-in shower;Door [employer] Toilet: Handicapped height Bathroom Accessibility: Yes How Accessible: Accessible via walker Home Adaptive Equipment: Shower chair with back;Straight cane;Walker - four wheeled Additional Comments: Son and dtr in law live next door and provide housekeeping and food/meals for pt Prior Function Level of Independence: Independent with basic ADLs;Requires assistive device for independence;Needs assistance with homemaking Meal Prep: Minimal Homemaking Assistance Comments: daughter in law assists with all cleaning. Able to Take Stairs?: Yes Driving: Yes Vocation: Retired Financial risk analyst Arousal/Alertness: Awake/alert Overall Cognitive Status: Appears within  functional limits for tasks assessed   Mobility (including Balance) Bed Mobility Sit to Supine: 5: Supervision;With rail;HOB flat Sit to  Supine - Details (indicate cue type and reason): rails, cueing to scoot up in bed Scooting to [facility]: 6: Modified independent (Device/Increase time);With rail Transfers Sit to Stand: 4: Min assist;From chair/3-in-1;With upper extremity assist Sit to Stand Details (indicate cue type and reason): manual facilitation to reach upright Stand to Sit: 4: Min assist;With upper extremity assist;To bed Stand to Sit Details: manual facilitation and verbal cues to control descent Ambulation/Gait Ambulation/Gait Assistance: 4: Min assist Ambulation/Gait Assistance Details (indicate cue type and reason): for safety, cueing for technique Ambulation Distance (Feet): 10 Feet Assistive device: Rolling walker Gait Pattern: Step-through pattern  Posture/Postural Control Posture/Postural Control: No significant limitations Balance Balance Assessed: No Exercise    End of Session PT - End of Session Equipment Utilized During Treatment: Gait belt Activity Tolerance: Patient limited by fatigue Patient left: in bed;with bed alarm set;with call bell in reach General Behavior During Session: [facility] for tasks performed Cognition: [facility] for tasks performed  Olivia Canter, [place] [phone] 09/09/2011, 12:26 PM

## 2011-09-10 LAB — BASIC METABOLIC PANEL
BUN: 29 mg/dL — ABNORMAL HIGH (ref 6–23)
CO2: 21 mEq/L (ref 19–32)
Calcium: 8.3 mg/dL — ABNORMAL LOW (ref 8.4–10.5)
Creatinine, Ser: 0.69 mg/dL (ref 0.50–1.35)
GFR calc non Af Amer: 76 mL/min — ABNORMAL LOW (ref >90)
Glucose, Bld: 156 mg/dL — ABNORMAL HIGH (ref 70–99)
Sodium: 144 mEq/L (ref 135–145)

## 2011-09-10 LAB — GLUCOSE, CAPILLARY
Glucose-Capillary: 156 mg/dL — ABNORMAL HIGH (ref 70–99)
Glucose-Capillary: 212 mg/dL — ABNORMAL HIGH (ref 70–99)
Glucose-Capillary: 133 mg/dL — ABNORMAL HIGH (ref 70–99)

## 2011-09-10 MED ORDER — SODIUM CHLORIDE 0.9 % IV BOLUS (SEPSIS)
250.0000 mL | Freq: Once | INTRAVENOUS | Status: AC
  Administered 2011-09-10: 250 mL via INTRAVENOUS

## 2011-09-10 NOTE — Plan of Care (Signed)
Problem: Consults Goal: Nutrition Consult-if indicated Outcome: Completed/Met Date Met:  09/10/11 Glucerna supplemented  Problem: Phase II Progression Outcomes Goal: Obtain order to discontinue catheter if appropriate Outcome: Not Applicable Date Met:  09/10/11 Flexiseal came out and pt refused to have it reinserted.   Problem: Discharge Progression Outcomes Goal: Tolerating diet Outcome: Completed/Met Date Met:  09/10/11 Glucerna supplemented

## 2011-09-10 NOTE — Progress Notes (Addendum)
PCP: [name], MD, MD  Brief HPI:  This is a 78 year old, Caucasian male, who has a history of, diabetes, coronary artery disease, atrial fibrillation, BPH, who was discharged from the hospital on March 11 after being treated for C. difficile Colitis. That was his third episode of C. difficile within the last 4 months. Patient was discharged on a long tapering course of vancomycin. 2 days ago, the vancomycin dose changed from once daily to every other day. So, he did not have a dose to take yesterday. Last night. Patient started having profuse diarrhea. His meeting. Quantity, watery in nature. Denies any blood in the stool. He's had one episode in the emergency department. In about 3-4 episodes overnight. Denies any abdominal pain. Denies any nausea, or vomiting. However, has been very weak and somewhat dizzy. Denies any syncopal episodes. Denies any other symptoms of just of feel the cough or shortness of breath. Denies any dysuria. He did feel warm but did not check his temperature. His weakness persisted, and, so, he decided to come back to the hospital.   Past medical history:  Past Medical History   Diagnosis  Date   .  Myocardial infarct    .  Diabetes mellitus    .  Hypertension    .  CHF (congestive heart failure)    .  Coronary artery disease    .  Dysrhythmia    .  Clostridium difficile diarrhea    .  Cancer      Bladder cancer   .  Hyperlipidemia      Consultants: ID  Procedures: None  Subjective: Patient feels same as yesterday. Still with significant diarrhea. Denies abdominal pain or nausea. Tolerating diet.  Objective: Vital signs in last 24 hours: Temp:  [97.7 F (36.5 C)-98.1 F (36.7 C)] 97.8 F (36.6 C) (03/31 0443) Pulse Rate:  [108-119] 112  (03/31 0443) Resp:  [18-20] 18  (03/31 0443) BP: (100)/(60-63) 100/63 mmHg (03/31 0443) SpO2:  [93 %-96 %] 96 % (03/31 0443) Weight:  [83 kg (182 lb 15.7 oz)] 83 kg (182 lb 15.7 oz) (03/31 0444) Weight change:  -15 kg (-33 lb 1.1 oz) Last BM Date: 09/10/11  Intake/Output from previous day: 03/30 0701 - 03/31 0700 In: 1497.5 [P.O.:600; I.V.:797.5; IV Piggyback:100] Out: -  Intake/Output this shift: Total I/O In: 240 [P.O.:240] Out: -   General appearance: alert, cooperative and no distress Head: Normocephalic, without obvious abnormality, atraumatic Resp: clear to auscultation bilaterally Cardio: regular rate and rhythm, S1, S2 normal, no murmur, click, rub or gallop GI: soft, non-tender; bowel sounds normal; no masses,  no organomegaly Extremities: extremities normal, atraumatic, no cyanosis or edema Pulses: 2+ and symmetric Neurologic: Grossly normal  Lab Results:  Basename 09/09/11 0630 09/08/11 0942  WBC 12.3* 11.8*  HGB 11.6* 12.4*  HCT 35.2* 37.1*  PLT 72* 84*   BMET  Basename 09/10/11 0545 09/09/11 1657 09/09/11 0630 09/08/11 0942  NA 144 143 -- --  K 3.8 3.6 -- --  CL 114* 110 -- --  CO2 21 24 -- --  GLUCOSE 156* 166* -- --  BUN 29* 28* -- --  CREATININE 0.69 0.82 -- --  CALCIUM 8.3* 8.5 -- --  ALT -- -- 6 8    Studies/Results: No results found.  Medications:  Scheduled:    . aspirin EC  81 mg Oral Daily  . ezetimibe  10 mg Oral QHS  . feeding supplement  237 mL Oral TID WC  . finasteride  5 mg Oral Daily  . gemfibrozil  600 mg Oral BID AC  . insulin aspart  0-15 Units Subcutaneous TID WC  . iron polysaccharides  150 mg Oral Daily  . levothyroxine  25 mcg Oral QAC breakfast  . metronidazole  500 mg Intravenous Q8H  . mirtazapine  7.5 mg Oral QHS  . potassium chloride  40 mEq Oral Q4H  . saccharomyces boulardii  500 mg Oral BID  . sodium chloride  3 mL Intravenous Q12H  . vancomycin  125 mg Oral Q6H  . Warfarin - Pharmacist Dosing Inpatient   Does not apply q1800  . DISCONTD: warfarin  5 mg Oral Custom  . DISCONTD: warfarin  7.5 mg Oral Q Sun-1800    Assessment/Plan:  Principal Problem:  *C. difficile colitis Active Problems:  CAD (coronary  artery disease)  Paroxysmal a-fib  DM (diabetes mellitus), type 2, uncontrolled with complications  Diarrhea  Dehydration  Anemia  SIRS (systemic inflammatory response syndrome)  Thrombocytopenia    #1 C. difficile Colitis: Stable. Still with diarrhea. Continue PO Vanc and IV flagyl. Appreciate ID input. This appears to be either his fourth episode or failed treatment of his third episode. ID considering Rifaximin. Continue florastar. Continue IV fluids gently.   #2 history of diabetes, type II. He'll be put on a sliding scale. Continue to hold his oral hypoglycemic agents for now. Consider reinitiating if CBG remains significantly elevated.  #3 history of atrial fibrillation: This appears to be paroxysmal. He is on warfarin. We'll check a PT/INR and have pharmacy dose the warfarin.   #4 history of coronary artery disease, is stable.   #5 Borderline Low BP/Tachycardia: Probably due to hypovolemia. Due to age/cardiac disease and the fact that he is asymptomatic, we are being cautious with IVF. Will give gentle boluses. Monitor closely.  #6 Thrombocytopenia is chronic. Etiology is unclear.   #7 anemia is stable. Continue to monitor.   #8 Hypokalemia: repleted. Mg is normal.  Patient is a DNR/DNI.   PT/OT   LOS: 2 days   [facility] Pager [phone] 09/10/2011, 10:33 AM

## 2011-09-10 NOTE — Progress Notes (Signed)
ANTICOAGULATION CONSULT NOTE - Follow up Consult  Pharmacy Consult for Coumadin Indication: atrial fibrillation  Allergies  Allergen Reactions  . Morphine And Related Other (See Comments)    Reaction unknown    Patient Measurements: Height: 6\' 1"  (185.4 cm) Weight: 182 lb 15.7 oz (83 kg) IBW/kg (Calculated) : 79.9    Vital Signs: Temp: 97.8 F (36.6 C) (03/31 0443) Temp src: Oral (03/31 0443) BP: 100/63 mmHg (03/31 0443) Pulse Rate: 112  (03/31 0443)  Labs:  Basename 09/10/11 0545 09/09/11 1657 09/09/11 0630 09/08/11 1334 09/08/11 0942  HGB -- -- 11.6* -- 12.4*  HCT -- -- 35.2* -- 37.1*  PLT -- -- 72* -- 84*  APTT -- -- -- -- --  LABPROT 35.2* -- 35.7* 27.3* --  INR 3.44* -- 3.51* 2.49* --  HEPARINUNFRC -- -- -- -- --  CREATININE 0.69 0.82 0.76 -- --  CKTOTAL -- -- -- -- --  CKMB -- -- -- -- --  TROPONINI -- -- -- -- --   Estimated Creatinine Clearance: 58.3 ml/min (by C-G formula based on Cr of 0.69).    Assessment: 78 y/o male admitted with recurrent diarrhea and h/o Cdiff 12/12 and 1/13 (recurrent). Last course of abx completed 08/13/11. Added IV Flagyl, po Vanco, Florastor. Clinically improving.  Afebrile today.  Afib: INR 3.44 on home 7.5mg  Sundays and 5mg  other days.  Goal of Therapy:  INR 2-3   Plan:  Hold Coumadin for increased INR again today.  Check INR in AM. Celedonio Miyamoto, PharmD, BCPS Clinical Pharmacist Pager [phone]   09/10/2011,12:04 PM

## 2011-09-11 LAB — CBC
Hemoglobin: 11.8 g/dL — ABNORMAL LOW (ref 13.0–17.0)
MCH: 32 pg (ref 26.0–34.0)
MCHC: 32.9 g/dL (ref 30.0–36.0)
MCV: 97.3 fL (ref 78.0–100.0)

## 2011-09-11 LAB — GLUCOSE, CAPILLARY: Glucose-Capillary: 120 mg/dL — ABNORMAL HIGH (ref 70–99)

## 2011-09-11 LAB — BASIC METABOLIC PANEL
Calcium: 8.5 mg/dL (ref 8.4–10.5)
Creatinine, Ser: 0.58 mg/dL (ref 0.50–1.35)
GFR calc non Af Amer: 82 mL/min — ABNORMAL LOW (ref >90)
Glucose, Bld: 129 mg/dL — ABNORMAL HIGH (ref 70–99)
Sodium: 145 mEq/L (ref 135–145)

## 2011-09-11 LAB — DIFFERENTIAL
Basophils Relative: 0 % (ref 0–1)
Eosinophils Absolute: 0.2 10*3/uL (ref 0.0–0.7)
Eosinophils Relative: 2 % (ref 0–5)
Lymphs Abs: 2 10*3/uL (ref 0.7–4.0)
Monocytes Absolute: 0.9 10*3/uL (ref 0.1–1.0)
Monocytes Relative: 14 % — ABNORMAL HIGH (ref 3–12)
Neutrophils Relative %: 54 % (ref 43–77)

## 2011-09-11 LAB — PROTIME-INR: INR: 2.76 — ABNORMAL HIGH (ref 0.00–1.49)

## 2011-09-11 MED ORDER — WARFARIN SODIUM 2.5 MG PO TABS
2.5000 mg | ORAL_TABLET | Freq: Once | ORAL | Status: AC
  Administered 2011-09-11: 2.5 mg via ORAL
  Filled 2011-09-11: qty 1

## 2011-09-11 NOTE — Progress Notes (Signed)
  ID PROGRESS NOTE 78 yo M with recurrent cdifficile infection x 4-5 episode since fall of 2012.  Subjective: Feeling better. Patient had 6 BMs yesterday, but today only 2. Somewhat improved. Denies fever/chills/nightsweats or abdominal pain. No blood in stool     . aspirin EC  81 mg Oral Daily  . ezetimibe  10 mg Oral QHS  . feeding supplement  237 mL Oral TID WC  . finasteride  5 mg Oral Daily  . gemfibrozil  600 mg Oral BID AC  . insulin aspart  0-15 Units Subcutaneous TID WC  . iron polysaccharides  150 mg Oral Daily  . levothyroxine  25 mcg Oral QAC breakfast  . metronidazole  500 mg Intravenous Q8H  . mirtazapine  7.5 mg Oral QHS  . saccharomyces boulardii  500 mg Oral BID  . sodium chloride  3 mL Intravenous Q12H  . vancomycin  125 mg Oral Q6H  . warfarin  2.5 mg Oral ONCE-1800  . Warfarin - Pharmacist Dosing Inpatient   Does not apply q1800     Objective: Vital signs in last 24 hours: Temp:  [97.4 F (36.3 C)-97.8 F (36.6 C)] 97.8 F (36.6 C) (04/01 1454) Pulse Rate:  [95-116] 116  (04/01 1454) Resp:  [19-20] 20  (04/01 1454) BP: (97-109)/(63-65) 97/64 mmHg (04/01 1454) SpO2:  [95 %-96 %] 96 % (04/01 1454) Weight:  [185 lb 3 oz (84 kg)] 185 lb 3 oz (84 kg) (04/01 0659)  gen = alert and oriented x 4, in NAD Heent= perrla, eomi, no scleral icterus. Moist mucus membranes Pulm= CTAB Cors= nl s1, s2 Abd= soft, decreased bs, nontender  Lab Results  Basename 09/11/11 0705 09/10/11 0545 09/09/11 0630  WBC -- -- 12.3*  HGB -- -- 11.6*  HCT -- -- 35.2*  NA 145 144 --  K 3.8 3.8 --  CL 115* 114* --  CO2 25 21 --  BUN 24* 29* --  CREATININE 0.58 0.69 --  GLU -- -- --   Liver Panel  Basename 09/09/11 0630  PROT 5.8*  ALBUMIN 2.6*  AST 15  ALT 6  ALKPHOS 43  BILITOT 0.6  BILIDIR --  IBILI --    Microbiology: Recent Results (from the past 240 hour(s))  CLOSTRIDIUM DIFFICILE BY PCR     Status: Abnormal   Collection Time   09/08/11 11:36 PM   Component Value Range Status Comment   C difficile by pcr POSITIVE (*) NEGATIVE  Final      Assessment/Plan: cdifficile infection = continue with vancomycin 125mg  PO QID   - discontinue IV metronidazole.  - will treat for a total of 14 days with vancomycin 125mg  PO QID, and follow with rifaxamin for 10days.  -- will set up ID appt follow-up  -- did discuss that the patient would be an ideal candidate for fecal microbiota therapy/transplant. Recommended to go to [facility] since they have fecal txp program.  Aram Beecham B. Drue Second MD MPH [facility] [phone]   LOS: 3 days    Judyann Munson 09/11/2011, 3:11 PM

## 2011-09-11 NOTE — Discharge Instructions (Signed)
[facility] [phone] hhrn, phy ther

## 2011-09-11 NOTE — Progress Notes (Signed)
Occupational Therapy Treatment Patient Details Name: David Davidson MRN: [id number] DOB: Nov 08, 1910 Today's Date: 09/11/2011 9:53-10:24  2sc OT Assessment/Plan OT Assessment/Plan Comments on Treatment Session: Pt still overall min guard for blance with use of RW for support with selfcare tasks.  Still feel he needs initial 24 hour supervision if discharged home secondary to increased fall risk and weakness,  Pt continues to improve with endurance however. OT Plan: Discharge plan needs to be updated Follow Up Recommendations: Home health OT Equipment Recommended: None recommended by OT OT Goals ADL Goals ADL Goal: Grooming - Progress: Progressing toward goals  OT Treatment Precautions/Restrictions  Precautions Precautions: Fall Restrictions Weight Bearing Restrictions: No   ADL ADL Grooming: Performed;Shaving;Teeth care;Supervision/safety (min guard assist) Where Assessed - Grooming: Standing at sink Ambulation Related to ADLs: Pt utilized RW for ambulation to the sink with min guard assist. ADL Comments: Pt able to tolerate standing for 7-8 mins at the sink while participating in grooming tasks.  Noted one LOB which he was able to self correct during transition to the sink. Mobility  Bed Mobility Bed Mobility: Yes Supine to Sit: 5: Supervision;HOB elevated (Comment degrees) Supine to Sit Details (indicate cue type and reason): 45 degrees Sit to Supine: 5: Supervision;HOB elevated (comment degrees) Sit to Supine - Details (indicate cue type and reason): 45 degrees Transfers Transfers: Yes Sit to Stand: 5: Supervision;From bed;With upper extremity assist (min guard) Stand to Sit: 5: Supervision Stand to Sit Details: Pt with uncontrolled descent during transition to sitting. End of Session OT - End of Session Activity Tolerance: Patient limited by fatigue Patient left: in bed;with call bell in reach;with bed alarm set General Behavior During Session: [facility] for tasks  performed Cognition: [facility] for tasks performed  Yalonda Sample OTR/L 09/11/2011, 11:25 AM Pager number [phone]

## 2011-09-11 NOTE — Progress Notes (Signed)
ANTICOAGULATION CONSULT NOTE - Follow Up Consult  Pharmacy Consult for : Coumadin Indication: atrial fibrillation  Allergies  Allergen Reactions  . Morphine And Related Other (See Comments)    Reaction unknown    Patient Measurements: Height: 6\' 1"  (185.4 cm) Weight: 185 lb 3 oz (84 kg) IBW/kg (Calculated) : 79.9    Vital Signs: Temp: 97.7 F (36.5 C) (04/01 0659) Temp src: Oral (04/01 0659) BP: 109/65 mmHg (04/01 0659) Pulse Rate: 108  (04/01 0659)  Labs:  Basename 09/11/11 0705 09/10/11 0545 09/09/11 1657 09/09/11 0630  HGB -- -- -- 11.6*  HCT -- -- -- 35.2*  PLT -- -- -- 72*  APTT -- -- -- --  LABPROT 29.6* 35.2* -- 35.7*  INR 2.76* 3.44* -- 3.51*  HEPARINUNFRC -- -- -- --  CREATININE 0.58 0.69 0.82 --  CKTOTAL -- -- -- --  CKMB -- -- -- --  TROPONINI -- -- -- --   Estimated Creatinine Clearance: 58.3 ml/min (by C-G formula based on Cr of 0.58).   Medications:     aspirin EC 81 mg Oral Daily  ezetimibe 10 mg Oral QHS  feeding supplement 237 mL Oral TID WC  finasteride 5 mg Oral Daily  gemfibrozil 600 mg Oral BID AC  insulin aspart 0-15 Units Subcutaneous TID WC  iron polysaccharides 150 mg Oral Daily  levothyroxine 25 mcg Oral QAC breakfast  metronidazole 500 mg Intravenous Q8H  mirtazapine 7.5 mg Oral QHS  saccharomyces boulardii 500 mg Oral BID  sodium chloride 250 mL Intravenous Once  sodium chloride 3 mL Intravenous Q12H  vancomycin 125 mg Oral Q6H  Warfarin - Pharmacist Dosing Inpatient  Does not apply q1800    Assessment:  Patient is a 78 y/o male on chronic Coumadin for history of atrial fibrillation who is currently being treated for C.Diff with po Vancomycin and IV Flagyl.    INR has dropped to 2.76 while Coumadin has been on hold.  INR currently within therapeutic range.  Goal of Therapy:   INR 2-3   Plan:   Restart Coumadin today at reduced dose due to continued Flagyl administration.  Coumadin 2.5 mg today.  Kalvin Buss, Elisha Headland, Pharm.D. 09/11/2011 11:54 AM

## 2011-09-11 NOTE — Progress Notes (Signed)
   CARE MANAGEMENT NOTE 09/11/2011  Patient:  David Davidson, David Davidson   Account Number:  [id number]  Date Initiated:  09/11/2011  Documentation initiated by:  Junius Creamer  Subjective/Objective Assessment:   adm w c diff colitis     Action/Plan:   lives alone, pcp dr [name], several times has gone to snf for rehab   Anticipated DC Date:  09/14/2011   Anticipated DC Plan:  HOME W HOME HEALTH SERVICES      DC Planning Services  CM consult      [facility] Choice  Resumption Of Svcs/PTA Provider   Choice offered to / List presented to:          [facility] arranged  HH-1 RN  HH-2 PT      [facility] agency  [facility]   Status of service:   Medicare Important Message given?   (If response is "NO", the following Medicare IM given date [place] will be blank) Date Medicare IM given:   Date Additional Medicare IM given:    Discharge Disposition:  HOME W HOME HEALTH SERVICES  Per UR Regulation:    If discussed at Long Length of Stay Meetings, dates discussed:    Comments:  4/1 spoke w pt and fam. pt act w [facility] for hhc. he has rolling walker and cane. have alerted [facility] of adm.debbie Oluwatimilehin Balfour rn,bsn [id number]

## 2011-09-11 NOTE — Progress Notes (Signed)
PCP: [name], MD, MD  Brief HPI:  This is a 78 year old, Caucasian male, who has a history of, diabetes, coronary artery disease, atrial fibrillation, BPH, who was discharged from the hospital on March 11 after being treated for C. difficile Colitis. That was his third episode of C. difficile within the last 4 months. Patient was discharged on a long tapering course of vancomycin. 2 days ago, the vancomycin dose changed from once daily to every other day. So, he did not have a dose to take yesterday. Last night. Patient started having profuse diarrhea. His meeting. Quantity, watery in nature. Denies any blood in the stool. He's had one episode in the emergency department. In about 3-4 episodes overnight. Denies any abdominal pain. Denies any nausea, or vomiting. However, has been very weak and somewhat dizzy. Denies any syncopal episodes. Denies any other symptoms of just of feel the cough or shortness of breath. Denies any dysuria. He did feel warm but did not check his temperature. His weakness persisted, and, so, he decided to come back to the hospital.   Past medical history:  Past Medical History   Diagnosis  Date   .  Myocardial infarct    .  Diabetes mellitus    .  Hypertension    .  CHF (congestive heart failure)    .  Coronary artery disease    .  Dysrhythmia    .  Clostridium difficile diarrhea    .  Cancer      Bladder cancer   .  Hyperlipidemia      Consultants: ID  Procedures: None  Subjective: Patient feels better. Stool is more solid now. Has had 2 BM's today so far. Denies abdominal pain or nausea. Tolerating diet.  Objective: Vital signs in last 24 hours: Temp:  [97.4 F (36.3 C)-97.9 F (36.6 C)] 97.7 F (36.5 C) (04/01 0659) Pulse Rate:  [95-108] 108  (04/01 0659) Resp:  [18-20] 20  (04/01 0659) BP: (109)/(63-65) 109/65 mmHg (04/01 0659) SpO2:  [95 %-96 %] 96 % (04/01 0659) Weight:  [84 kg (185 lb 3 oz)] 84 kg (185 lb 3 oz) (04/01 0659) Weight  change: 1 kg (2 lb 3.3 oz) Last BM Date: 09/10/11  Intake/Output from previous day: 03/31 0701 - 04/01 0700 In: 1580 [P.O.:480; I.V.:400; IV Piggyback:700] Out: 2 [Stool:2] Intake/Output this shift:    General appearance: alert, cooperative and no distress Head: Normocephalic, without obvious abnormality, atraumatic Resp: clear to auscultation bilaterally Cardio: regular rate and rhythm, S1, S2 normal, no murmur, click, rub or gallop GI: soft, non-tender; bowel sounds normal; no masses,  no organomegaly Extremities: extremities normal, atraumatic, no cyanosis or edema Neurologic: Grossly normal  Lab Results:  Basename 09/09/11 0630  WBC 12.3*  HGB 11.6*  HCT 35.2*  PLT 72*   BMET  Basename 09/11/11 0705 09/10/11 0545 09/09/11 0630  NA 145 144 --  K 3.8 3.8 --  CL 115* 114* --  CO2 25 21 --  GLUCOSE 129* 156* --  BUN 24* 29* --  CREATININE 0.58 0.69 --  CALCIUM 8.5 8.3* --  ALT -- -- 6    Studies/Results: No results found.  Medications:  Scheduled:    . aspirin EC  81 mg Oral Daily  . ezetimibe  10 mg Oral QHS  . feeding supplement  237 mL Oral TID WC  . finasteride  5 mg Oral Daily  . gemfibrozil  600 mg Oral BID AC  . insulin aspart  0-15 Units Subcutaneous  TID WC  . iron polysaccharides  150 mg Oral Daily  . levothyroxine  25 mcg Oral QAC breakfast  . metronidazole  500 mg Intravenous Q8H  . mirtazapine  7.5 mg Oral QHS  . saccharomyces boulardii  500 mg Oral BID  . sodium chloride  250 mL Intravenous Once  . sodium chloride  250 mL Intravenous Once  . sodium chloride  3 mL Intravenous Q12H  . vancomycin  125 mg Oral Q6H  . Warfarin - Pharmacist Dosing Inpatient   Does not apply q1800    Assessment/Plan:  Principal Problem:  *C. difficile colitis Active Problems:  CAD (coronary artery disease)  Paroxysmal a-fib  DM (diabetes mellitus), type 2, uncontrolled with complications  Diarrhea  Dehydration  Anemia  SIRS (systemic inflammatory  response syndrome)  Thrombocytopenia    #1 C. difficile Colitis: Stable. Seems to be improving as stools are more solid per patient. Continue PO Vanc and IV flagyl. ID following. This appears to be either his fourth episode or failed treatment of his third episode. ID considering Rifaximin. Continue florastar.    #2 history of diabetes, type II. He'll be put on a sliding scale. Continue to hold his oral hypoglycemic agents for now. Consider reinitiating if CBG remains significantly elevated.  #3 history of atrial fibrillation: This appears to be paroxysmal. He is on warfarin. Pharmacy dosing warfarin.   #4 history of coronary artery disease, is stable.   #5 Borderline Low BP/Tachycardia: Probably due to hypovolemia. Has improved. Due to age/cardiac disease and the fact that he is asymptomatic, we were being cautious with IVF.   #6 Thrombocytopenia is chronic. Etiology is unclear.   #7 anemia is stable. Continue to monitor.   #8 Hypokalemia: repleted. Mg is normal.  Patient is a DNR/DNI.   PT/OT  Disposition: Hopefully home in 1-2 days. Will need further recommendations from ID regarding treatment plan.   LOS: 3 days   [facility] Pager [phone] 09/11/2011, 9:53 AM

## 2011-09-11 NOTE — Progress Notes (Signed)
Patient up to bathroom with assistance. Has had 2 BM so far this shift that he was continent. Madelin Rear Ray

## 2011-09-12 LAB — GLUCOSE, CAPILLARY
Glucose-Capillary: 132 mg/dL — ABNORMAL HIGH (ref 70–99)
Glucose-Capillary: 227 mg/dL — ABNORMAL HIGH (ref 70–99)
Glucose-Capillary: 142 mg/dL — ABNORMAL HIGH (ref 70–99)

## 2011-09-12 LAB — PROTIME-INR: INR: 2.04 — ABNORMAL HIGH (ref 0.00–1.49)

## 2011-09-12 MED ORDER — COUMADIN BOOK
1.0000 | Freq: Once | Status: AC
  Administered 2011-09-12: 1
  Filled 2011-09-12: qty 1

## 2011-09-12 MED ORDER — WARFARIN VIDEO
1.0000 | Freq: Once | Status: AC
  Administered 2011-09-13: 1

## 2011-09-12 MED ORDER — WARFARIN SODIUM 5 MG PO TABS
5.0000 mg | ORAL_TABLET | Freq: Once | ORAL | Status: AC
  Administered 2011-09-12: 5 mg via ORAL
  Filled 2011-09-12: qty 1

## 2011-09-12 NOTE — Progress Notes (Signed)
ANTICOAGULATION CONSULT NOTE - Follow Up Consult  Pharmacy Consult for : Coumadin Indication: atrial fibrillation  Allergies  Allergen Reactions  . Morphine And Related Other (See Comments)    Reaction unknown    Patient Measurements: Height: 6\' 1"  (185.4 cm) Weight: 183 lb 3.2 oz (83.1 kg) IBW/kg (Calculated) : 79.9    Vital Signs: Temp: 97.8 F (36.6 C) (04/02 0516) Temp src: Oral (04/02 0516) BP: 100/66 mmHg (04/02 0516) Pulse Rate: 109  (04/02 0516)  Labs:  Basename 09/12/11 0511 09/11/11 1550 09/11/11 0705 09/10/11 0545 09/09/11 1657  HGB -- 11.8* -- -- --  HCT -- 35.9* -- -- --  PLT -- 93* -- -- --  APTT -- -- -- -- --  LABPROT 23.4* -- 29.6* 35.2* --  INR 2.04* -- 2.76* 3.44* --  HEPARINUNFRC -- -- -- -- --  CREATININE -- -- 0.58 0.69 0.82  CKTOTAL -- -- -- -- --  CKMB -- -- -- -- --  TROPONINI -- -- -- -- --   Estimated Creatinine Clearance: 58.3 ml/min (by C-G formula based on Cr of 0.58).   Medications:  Scheduled:    . aspirin EC  81 mg Oral Daily  . ezetimibe  10 mg Oral QHS  . feeding supplement  237 mL Oral TID WC  . finasteride  5 mg Oral Daily  . gemfibrozil  600 mg Oral BID AC  . insulin aspart  0-15 Units Subcutaneous TID WC  . iron polysaccharides  150 mg Oral Daily  . levothyroxine  25 mcg Oral QAC breakfast  . mirtazapine  7.5 mg Oral QHS  . saccharomyces boulardii  500 mg Oral BID  . sodium chloride  3 mL Intravenous Q12H  . vancomycin  125 mg Oral Q6H  . warfarin  2.5 mg Oral ONCE-1800  . Warfarin - Pharmacist Dosing Inpatient   Does not apply q1800  . DISCONTD: metronidazole  500 mg Intravenous Q8H   Anti-infectives     Start     Dose/Rate Route Frequency Ordered Stop   09/08/11 2200   metroNIDAZOLE (FLAGYL) IVPB 500 mg  Status:  Discontinued        500 mg 100 mL/hr over 60 Minutes Intravenous Every 8 hours 09/08/11 1544 09/11/11 1515   09/08/11 1330   vancomycin (VANCOCIN) 50 mg/mL oral solution 125 mg        125 mg Oral 4  times per day 09/08/11 1316     09/08/11 1315   vancomycin (VANCOCIN) 125 MG capsule 125 mg  Status:  Discontinued        125 mg Oral  Once 09/08/11 1304 09/08/11 1316   09/08/11 1245   metroNIDAZOLE (FLAGYL) IVPB 500 mg        500 mg 100 mL/hr over 60 Minutes Intravenous  Once 09/08/11 1242 09/08/11 1356          Assessment: Patient is a 78 y/o male on chronic Coumadin for history of atrial fibrillation who is currently being treated for C.Diff with po Vancomycin.  Flagyl has been stopped due to improving symptoms.   INR has been on hold with concurrent Flagyl and SUPRAtherapeutic INR.  Coumadin restarted yesterday.  INR within therapeutic range.  Goal of Therapy:   INR 2-3   Plan:   Coumadin 5 mg po today.  Samyrah Bruster, Elisha Headland, Pharm.D. 09/12/2011 10:36 AM

## 2011-09-12 NOTE — Progress Notes (Signed)
09/12/2011 Lockie Bothun Elizabeth PTA [phone] pager [phone] office    

## 2011-09-12 NOTE — Progress Notes (Signed)
Physical Therapy Treatment Patient Details Name: KYVON HU MRN: [id number] DOB: Sep 22, 1910 Today's Date: 09/12/2011  PT Assessment/Plan  PT - Assessment/Plan Comments on Treatment Session: Pt had improved amb. Pt limited by uncontroled BM. Increase time for peri- care   PT Plan: Discharge plan remains appropriate;Frequency remains appropriate PT Frequency: Min 3X/week Follow Up Recommendations: No PT follow up Equipment Recommended: Rolling walker with 5" wheels PT Goals  Acute Rehab PT Goals PT Goal: Rolling Supine to Left Side - Progress: Progressing toward goal PT Goal: Supine/Side to Sit - Progress: Progressing toward goal PT Goal: Sit to Stand - Progress: Progressing toward goal PT Goal: Stand to Sit - Progress: Progressing toward goal PT Goal: Ambulate - Progress: Progressing toward goal  PT Treatment Precautions/Restrictions  Precautions Precautions: Fall Precaution Comments: contact for c-diff Required Braces or Orthoses: No Restrictions Weight Bearing Restrictions: No Mobility (including Balance) Bed Mobility Rolling Left: 6: Modified independent (Device/Increase time);With rail Supine to Sit: 6: Modified independent (Device/Increase time) Transfers Sit to Stand: 3: Mod assist;From bed;From toilet Sit to Stand Details (indicate cue type and reason): assist to nitate stand from toilet. VC for safe hand placement and techinque  Stand to Sit: 3: Mod assist;To toilet;To chair/3-in-1 Stand to Sit Details: assist with uncontrolled descent during transition due to urgency to sit for BM Ambulation/Gait Ambulation/Gait Assistance: 4: Min assist Ambulation/Gait Assistance Details (indicate cue type and reason): assist with saftey with turns and RW placement  Ambulation Distance (Feet): 25 Feet Assistive device: Rolling walker Gait Pattern: Decreased stride length;Step-through pattern    Exercise    End of Session PT - End of Session Equipment Utilized During  Treatment: Gait belt Activity Tolerance: Patient tolerated treatment well Patient left: in chair;with bed alarm set;with call bell in reach Nurse Communication: Mobility status for ambulation General Behavior During Session: [facility] for tasks performed Cognition: [facility] for tasks performed  Judith Blonder 09/12/2011, 11:15 AM

## 2011-09-12 NOTE — Progress Notes (Signed)
PCP: [name], MD, MD  Brief HPI:  This is a 78 year old, Caucasian male, who has a history of, diabetes, coronary artery disease, atrial fibrillation, BPH, who was discharged from the hospital on March 11 after being treated for C. difficile Colitis. That was his third episode of C. difficile within the last 4 months. Patient was discharged on a long tapering course of vancomycin. 2 days ago, the vancomycin dose changed from once daily to every other day. So, he did not have a dose to take yesterday. Last night. Patient started having profuse diarrhea. His meeting. Quantity, watery in nature. Denies any blood in the stool. He's had one episode in the emergency department. In about 3-4 episodes overnight. Denies any abdominal pain. Denies any nausea, or vomiting. However, has been very weak and somewhat dizzy. Denies any syncopal episodes. Denies any other symptoms of just of feel the cough or shortness of breath. Denies any dysuria. He did feel warm but did not check his temperature. His weakness persisted, and, so, he decided to come back to the hospital.   Past medical history:  Past Medical History   Diagnosis  Date   .  Myocardial infarct    .  Diabetes mellitus    .  Hypertension    .  CHF (congestive heart failure)    .  Coronary artery disease    .  Dysrhythmia    .  Clostridium difficile diarrhea    .  Cancer      Bladder cancer   .  Hyperlipidemia      Consultants: ID  Procedures: None  Subjective: Patient continues to feel better. Had one episode of loose stool. Denies abdominal pain or nausea. Tolerating diet.  Objective: Vital signs in last 24 hours: Temp:  [97.8 F (36.6 C)-98.2 F (36.8 C)] 97.8 F (36.6 C) (04/02 0516) Pulse Rate:  [109-116] 109  (04/02 0516) Resp:  [18-20] 18  (04/02 0516) BP: (96-100)/(61-66) 100/66 mmHg (04/02 0516) SpO2:  [95 %-96 %] 95 % (04/02 0516) Weight:  [83.1 kg (183 lb 3.2 oz)] 83.1 kg (183 lb 3.2 oz) (04/02 0516) Weight  change: -0.9 kg (-1 lb 15.7 oz) Last BM Date: 09/11/11  Intake/Output from previous day: 04/01 0701 - 04/02 0700 In: 600 [P.O.:600] Out: 250 [Urine:250] Intake/Output this shift: Total I/O In: 243 [P.O.:240; I.V.:3] Out: 1 [Stool:1]  General appearance: alert, cooperative and no distress Head: Normocephalic, without obvious abnormality, atraumatic Resp: clear to auscultation bilaterally Cardio: regular rate and rhythm, S1, S2 normal, no murmur, click, rub or gallop GI: soft, non-tender; bowel sounds normal; no masses,  no organomegaly Extremities: extremities normal, atraumatic, no cyanosis or edema Neurologic: Grossly normal  Lab Results:  Basename 09/11/11 1550  WBC 6.7  HGB 11.8*  HCT 35.9*  PLT 93*   BMET  Basename 09/11/11 0705 09/10/11 0545  NA 145 144  K 3.8 3.8  CL 115* 114*  CO2 25 21  GLUCOSE 129* 156*  BUN 24* 29*  CREATININE 0.58 0.69  CALCIUM 8.5 8.3*  ALT -- --    Studies/Results: No results found.  Medications:  Scheduled:    . aspirin EC  81 mg Oral Daily  . ezetimibe  10 mg Oral QHS  . feeding supplement  237 mL Oral TID WC  . finasteride  5 mg Oral Daily  . gemfibrozil  600 mg Oral BID AC  . insulin aspart  0-15 Units Subcutaneous TID WC  . iron polysaccharides  150 mg Oral Daily  .  levothyroxine  25 mcg Oral QAC breakfast  . mirtazapine  7.5 mg Oral QHS  . saccharomyces boulardii  500 mg Oral BID  . sodium chloride  3 mL Intravenous Q12H  . vancomycin  125 mg Oral Q6H  . warfarin  2.5 mg Oral ONCE-1800  . warfarin  5 mg Oral ONCE-1800  . Warfarin - Pharmacist Dosing Inpatient   Does not apply q1800  . DISCONTD: metronidazole  500 mg Intravenous Q8H    Assessment/Plan:  Principal Problem:  *C. difficile colitis Active Problems:  CAD (coronary artery disease)  Paroxysmal a-fib  DM (diabetes mellitus), type 2, uncontrolled with complications  Diarrhea  Dehydration  Anemia  SIRS (systemic inflammatory response syndrome)   Thrombocytopenia    #1 C. difficile Colitis: Stable. Seems to be improving as stools are more solid per patient. Continue PO Vanc and IV flagyl. ID following. This appears to be either his fourth episode or failed treatment of his third episode.See ID recommendations regarding Vanc and Rifaximin. Continue florastar.   #2 history of diabetes, type II. He'll be put on a sliding scale. Continue to hold his oral hypoglycemic agents for now. Consider reinitiating if CBG remains significantly elevated.  #3 history of atrial fibrillation: This appears to be paroxysmal. He is on warfarin. Pharmacy dosing warfarin.   #4 history of coronary artery disease, is stable.   #5 Borderline Low BP/Tachycardia: Probably due to hypovolemia. Has improved. Due to age/cardiac disease and the fact that he is asymptomatic, we were being cautious with IVF.   #6 Thrombocytopenia is chronic. Etiology is unclear.   #7 anemia is stable. Continue to monitor.   #8 Hypokalemia: repleted. Mg is normal. Recheck in AM  Patient is a DNR/DNI.   PT/OT  Disposition: Patient improving steadily. I am hoping he will be able to go home in AM with home health.   LOS: 4 days   [facility] Pager [phone] 09/12/2011, 11:26 AM

## 2011-09-13 LAB — BASIC METABOLIC PANEL
BUN: 18 mg/dL (ref 6–23)
Calcium: 8 mg/dL — ABNORMAL LOW (ref 8.4–10.5)
Creatinine, Ser: 0.53 mg/dL (ref 0.50–1.35)
GFR calc Af Amer: >90 mL/min (ref >90)
GFR calc non Af Amer: 85 mL/min — ABNORMAL LOW (ref >90)
Glucose, Bld: 146 mg/dL — ABNORMAL HIGH (ref 70–99)

## 2011-09-13 LAB — PROTIME-INR: Prothrombin Time: 22.9 seconds — ABNORMAL HIGH (ref 11.6–15.2)

## 2011-09-13 LAB — GLUCOSE, CAPILLARY: Glucose-Capillary: 183 mg/dL — ABNORMAL HIGH (ref 70–99)

## 2011-09-13 MED ORDER — WARFARIN SODIUM 5 MG PO TABS: 5.0000 mg | ORAL_TABLET | Freq: Every evening | ORAL | Status: DC

## 2011-09-13 MED ORDER — RIFAXIMIN 200 MG PO TABS: 200.0000 mg | ORAL_TABLET | Freq: Three times a day (TID) | ORAL | Status: DC

## 2011-09-13 MED ORDER — VANCOMYCIN 50 MG/ML ORAL SOLUTION: ORAL | Status: DC

## 2011-09-13 NOTE — Progress Notes (Signed)
Agree with updated d/c plan.  09/13/2011 Cephus Shelling, PT, DPT [phone]

## 2011-09-13 NOTE — Progress Notes (Signed)
   CARE MANAGEMENT NOTE 09/13/2011  Patient:  David Davidson, David Davidson   Account Number:  [id number]  Date Initiated:  09/11/2011  Documentation initiated by:  Junius Creamer  Subjective/Objective Assessment:   adm w c diff colitis     Action/Plan:   lives alone, pcp dr [name], several times has gone to snf for rehab   Anticipated DC Date:  09/14/2011   Anticipated DC Plan:  HOME W HOME HEALTH SERVICES      DC Planning Services  CM consult      [facility] Choice  Resumption Of Svcs/PTA Provider   Choice offered to / List presented to:          [facility] arranged  HH-1 RN  HH-2 PT      [facility] agency  [facility]   Status of service:   Medicare Important Message given?   (If response is "NO", the following Medicare IM given date [place] will be blank) Date Medicare IM given:   Date Additional Medicare IM given:    Discharge Disposition:  HOME W HOME HEALTH SERVICES  Per UR Regulation:    If discussed at Long Length of Stay Meetings, dates discussed:    Comments:  4/3 pt has 33%copay for vanc po. spoke w pt and his son. pt uses [facility] and they have a few tablets in stock and do not mix liq. pt paid over $200 last month for tablets. they were ok w me checking w [facility] on cost of liq. the liq is 120.00 total so capy would be much less. they have rifaximin in stock also. cm sec ck on capy for this drug. son req i fax both prescript to [facility]. he will pick up meds there. alerted jerry w [facility] of disch and faxed dc orders to [facility] also. debbie Meganne Rita rn,bsn [phone]  4/3 checking to see what copay for vanc po for home. debbie Tomothy Eddins rn,bsn  4/1 spoke w pt and fam. pt act w [facility] for hhc. he has rolling walker and cane. have alerted [facility] of adm.debbie Reneshia Zuccaro rn,bsn [phone]

## 2011-09-13 NOTE — Progress Notes (Signed)
Pt. For discharge home today.  Attempted to see; however, pt. Eating lunch.  Spoke with pt. And he feels comfortable with discharge plan, and family will be available to assist him as necessary - he has all DME.   Jeani Hawking, OTR/L [phone]

## 2011-09-13 NOTE — Progress Notes (Signed)
09/13/11 Nsg 1450 Patient discharged to home with family, discharged instructions given and reviewed with patient & pt. son.  Both  verbalized understanding, care notes given for new meds and pertinent education. Skin intact at discharge, IV discharged and intact. Patient escorted to car via wheelchair by NT.  Forbes Cellar, RN

## 2011-09-13 NOTE — Discharge Summary (Addendum)
Patient ID: David Davidson MRN: [id number] DOB/AGE: 1910-10-20 78 y.o.  Admit date: 09/08/2011 Discharge date: 09/13/2011  Primary Care Physician:  [name], MD, MD   Discharge Diagnoses:    Present on Admission:  .Diarrhea .C. difficile colitis .SIRS (systemic inflammatory response syndrome) .CAD (coronary artery disease) .Paroxysmal a-fib .DM (diabetes mellitus), type 2, uncontrolled with complications .Dehydration .Anemia .Thrombocytopenia  Medication List  As of 09/13/2011 11:01 AM   STOP taking these medications         furosemide 20 MG tablet         TAKE these medications         acetaminophen 500 MG tablet   Commonly known as: TYLENOL   Take 1,000 mg by mouth at bedtime as needed. For pain      aspirin 81 MG tablet   Take 81 mg by mouth every morning.      cholecalciferol 1000 UNITS tablet   Commonly known as: VITAMIN D   Take 2,000 Units by mouth every morning.      ezetimibe 10 MG tablet   Commonly known as: ZETIA   Take 10 mg by mouth at bedtime.      feeding supplement Liqd   Take 237 mLs by mouth 3 (three) times daily with meals.      finasteride 5 MG tablet   Commonly known as: PROSCAR   Take 5 mg by mouth every morning.      fish oil-omega-3 fatty acids 1000 MG capsule   Take 1 g by mouth every morning.      gemfibrozil 600 MG tablet   Commonly known as: LOPID   Take 600 mg by mouth 2 (two) times daily before a meal.      glyBURIDE 5 MG tablet   Commonly known as: DIABETA   Take 5 mg by mouth.      hydrocortisone cream 1 %   Apply 1 application topically 2 (two) times daily. itchy back rash      iron polysaccharides 150 MG capsule   Commonly known as: NIFEREX   Take 1 capsule (150 mg total) by mouth daily.      levothyroxine 25 MCG tablet   Commonly known as: SYNTHROID, LEVOTHROID   Take 25 mcg by mouth daily.      LORazepam 0.5 MG tablet   Commonly known as: ATIVAN   Take 0.5 mg by mouth at bedtime as needed. For  insomnia.      mirtazapine 7.5 MG tablet   Commonly known as: REMERON   Take 7.5 mg by mouth at bedtime.      rifaximin 200 MG tablet   Commonly known as: XIFAXAN   Take 1 tablet (200 mg total) by mouth 3 (three) times daily.start 09/27/11      saccharomyces boulardii 250 MG capsule   Commonly known as: FLORASTOR   Take 250 mg by mouth 2 (two) times daily. Take for 14 days      Tamsulosin HCl 0.4 MG Caps   Commonly known as: FLOMAX   Take 0.4 mg by mouth every morning. Hold for SBP < 100.      vancomycin 50 mg/mL oral solution   Commonly known as: VANCOCIN   125 mg po QID for 14 days      warfarin 5 MG tablet   Commonly known as: COUMADIN   Take 1 tablet (5 mg total) by mouth every evening.              Consults:  ID    Significant Diagnostic Studies:  Dg Abd Acute W/chest  08/16/2011  *RADIOLOGY REPORT*  Clinical Data: Mid abdominal pain, diarrhea, history hypertension, diabetes, coronary disease post MI, CHF  ACUTE ABDOMEN SERIES (ABDOMEN 2 VIEW & CHEST 1 VIEW)  Comparison: Chest radiograph 06/25/2011  Findings: Enlargement of cardiac silhouette post CABG. Pulmonary vascular congestion. Left basilar atelectasis without infiltrate or effusion. Skin fold projects over left lung. Surgical clips right upper quadrant. Few nonspecific minimally prominent small bowel loops in abdomen. No definite evidence of bowel obstruction or bowel wall thickening. No free intraperitoneal air. Osseous demineralization with degenerative disc disease changes lumbar spine.  IMPRESSION: No definite acute abdominal findings. Enlargement of cardiac silhouette post CABG. Left basilar atelectasis.  Original Report Authenticated By: Lollie Marrow, M.D.    Brief H and P: For complete details please refer to admission H and P, but in brief  This is a 78 year old, Caucasian male, who has a history of, diabetes, coronary artery disease, atrial fibrillation, BPH, who was discharged from the hospital on March  11 after being treated for C. difficile Colitis. That was his third episode of C. difficile within the last 4 months. Patient was discharged on a long tapering course of vancomycin. 2 days ago, the vancomycin dose changed from once daily to every other day. So, he did not have a dose to take yesterday. Last night. Patient started having profuse diarrhea. His meeting. Quantity, watery in nature. Denies any blood in the stool. He's had one episode in the emergency department. In about 3-4 episodes overnight. Denies any abdominal pain. Denies any nausea, or vomiting. However, has been very weak and somewhat dizzy. Denies any syncopal episodes. Denies any other symptoms of just of feel the cough or shortness of breath. Denies any dysuria. He did feel warm but did not check his temperature. His weakness persisted, and, so, he decided to come back to the hospital.    Hospital Course:  Recurrent C. difficile colitis Patient was admitted to the medical floor, he was gently hydrated with IV fluids, stool was sent for C. difficile PCR. Patient was empirically started on by mouth vancomycin and IV Flagyl and since patient has recurrent episodes of C. difficile ,infection disease service was consulted. Flagyl was subsequently discontinued and patient was continued on by mouth Vancocin a floras tor. Patient's symptoms continued to improve and his bowel movements are less frequent and more formed, he was seen by infectious disease service today who recommended to discharge him on by mouth vancomycin for 14 days followed by rifaximin for another 14 days. Patient to follow with ID as an outpatient. diabetes mellitus, oral hypoglycemic agents were held and patient was placed on insulin sliding scale, he is eating better now and his CBGs ranged between 150-200, glyburide will be resumed on discharge. Monitor CBGs at home and adjust dose accordingly. Last INR was 1.9.  history of atrial fibrillation: This appears to be  paroxysmal. Pharmacy was dosing warfarin. We'll discharge her 5 mg of Coumadin daily, orders for home health RN to draw labs for PT/INR on Friday 4/5 in all results are PCP for further adjustment of Coumadin dose if needed.   history of coronary artery disease, is stable.  No chest pain, and hold Lasix for now. Patient to follow with PCP for further instruction. Thrombocytopenia is chronic. Etiology is unclear.   anemia is stable. Continue to monitor as an outpatient Hypokalemia: repleted. Mg is normal.   Patient is stable for discharge to  home with home health services.   Subjective:  Patient seen and examined, feeling better and stated that his stool is more formed. Denies any nausea vomiting or abdominal pain.      Filed Vitals:   09/13/11 0920  BP: 108/68  Pulse: 113  Temp: 97.3 F (36.3 C)  Resp: 24    General: Alert, awake, in no acute distress. Heart: Regular rate and rhythm, without murmurs, rubs, gallops. Lungs: Clear to auscultation bilaterally. Abdomen: Soft, nontender, nondistended, positive bowel sounds. Extremities: No clubbing cyanosis or edema with positive pedal pulses. Neuro: Grossly intact, nonfocal.   Disposition and Follow-up:  Follow his PCP in one week Follow with ID as scheduled   Time spent on Discharge: Approximately 45 minutes   Signed: Timira Bieda 09/13/2011, 11:01 AM

## 2011-09-13 NOTE — Progress Notes (Signed)
Physical Therapy Treatment Patient Details Name: David Davidson MRN: [id number] DOB: 09-Mar-1911 Today's Date: 09/13/2011  PT Assessment/Plan  PT - Assessment/Plan Comments on Treatment Session: Pt is progressing towards goals. Pt increased amb.   Pt requires 24 hr supervision for safety.   PT Plan: Discharge plan remains appropriate;Frequency remains appropriate PT Frequency: Min 3X/week Follow Up Recommendations: Home health PT;Supervision/Assistance - 24 hour Equipment Recommended: Rolling walker with 5" wheels PT Goals  Acute Rehab PT Goals PT Goal: Rolling Supine to Left Side - Progress: Progressing toward goal PT Goal: Supine/Side to Sit - Progress: Progressing toward goal PT Goal: Sit to Supine/Side - Progress: Progressing toward goal PT Goal: Sit to Stand - Progress: Progressing toward goal PT Goal: Stand to Sit - Progress: Progressing toward goal PT Goal: Ambulate - Progress: Progressing toward goal  PT Treatment Precautions/Restrictions  Precautions Precautions: Fall Precaution Comments: contact for c-diff Required Braces or Orthoses: No Restrictions Weight Bearing Restrictions: No Mobility (including Balance) Bed Mobility Bed Mobility: Yes Rolling Left: 6: Modified independent (Device/Increase time) Right Sidelying to Sit: 6: Modified independent (Device/Increase time) Supine to Sit: 6: Modified independent (Device/Increase time) Sit to Supine: 6: Modified independent (Device/Increase time) Scooting to [facility]: 6: Modified independent (Device/Increase time);With rail Transfers Sit to Stand: 3: Mod assist;With upper extremity assist;From toilet;From bed Sit to Stand Details (indicate cue type and reason): Pt required assistance from low toilet seat and VC for hand placement on RW Stand to Sit: To bed;To toilet;4: Min assist Stand to Sit Details: Pt needed VC to control descent and hand placement on RW Ambulation/Gait Ambulation/Gait: Yes Ambulation/Gait Assistance: 3:  Mod assist Ambulation/Gait Assistance Details (indicate cue type and reason): Pt required VC x4 to adjust body position/ feet placement in RW during amb.   Ambulation Distance (Feet): 25 Feet Assistive device: Rolling walker Gait Pattern: Step-to pattern Stairs: No Wheelchair Mobility Wheelchair Mobility: No    Exercise  General Exercises - Lower Extremity Long Arc Quad: AROM;Both;20 reps Hip Flexion/Marching: AROM;Both;Other reps (comment) (2x10 B ) End of Session PT - End of Session Equipment Utilized During Treatment: Gait belt Activity Tolerance: Patient tolerated treatment well Patient left: in bed;with call bell in reach;with bed alarm set General Behavior During Session: [facility] for tasks performed Cognition: [facility] for tasks performed  Judith Blonder, SPTA 09/13/2011, 2:21 PM [place]. [place], [place] [phone]

## 2011-09-13 NOTE — Progress Notes (Addendum)
ID PROGRESS NOTE  Date of Note: September 12, 2011  S: Patient continues to improve, only had 2 BM by late afternoon yesterday. No f/c/ns  O: BP 101/66  Pulse 112  Temp(Src) 98.2 F (36.8 C) (Oral)  Resp 20  Ht 6\' 1"  (1.854 m)  Wt 185 lb 3 oz (84 kg)  BMI 24.43 kg/m2  SpO2 95% gen = a xo  heent =MMM, AT/[place], pulm = CTAB Cors = nl s1,s2 abd = soft, BS decreased Ext = no c/c/e  LABS: CBC    Component Value Date/Time   WBC 6.7 09/11/2011 1550   RBC 3.69* 09/11/2011 1550   HGB 11.8* 09/11/2011 1550   HCT 35.9* 09/11/2011 1550   PLT 93* 09/11/2011 1550   MCV 97.3 09/11/2011 1550   MCH 32.0 09/11/2011 1550   MCHC 32.9 09/11/2011 1550   RDW 16.3* 09/11/2011 1550   LYMPHSABS 2.0 09/11/2011 1550   MONOABS 0.9 09/11/2011 1550   EOSABS 0.2 09/11/2011 1550   BASOSABS 0.0 09/11/2011 1550    BMET    Component Value Date/Time   NA 141 09/13/2011 0555   K 3.5 09/13/2011 0555   CL 113* 09/13/2011 0555   CO2 23 09/13/2011 0555   GLUCOSE 146* 09/13/2011 0555   BUN 18 09/13/2011 0555   CREATININE 0.53 09/13/2011 0555   CALCIUM 8.0* 09/13/2011 0555   GFRNONAA 85* 09/13/2011 0555   GFRAA >90 09/13/2011 0555    A: recurrent c.difficile infection  Recs: please send patient out on the following regimen of vancomycin 125mg  PO QID for a total of 14 days, followed by rifaximin 200mg  PO TID x 14 days.   we will have a follow up appt at [facility] ( ID clinic) to see how he is doing. Likely refer to [facility] for evaluation for fecal microbiota therapy/fecal txp.  Duke Salvia [facility] MD MPH [facility] [phone] April 2,2013 1700

## 2011-09-28 ENCOUNTER — Ambulatory Visit (INDEPENDENT_AMBULATORY_CARE_PROVIDER_SITE_OTHER): Payer: Medicare Other | Admitting: Internal Medicine

## 2011-09-28 ENCOUNTER — Encounter: Payer: Self-pay | Admitting: Internal Medicine

## 2011-09-28 VITALS — BP 119/70 | HR 92 | Temp 97.6°F | Wt 177.0 lb

## 2011-09-28 DIAGNOSIS — A0472 Enterocolitis due to Clostridium difficile, not specified as recurrent: Secondary | ICD-10-CM

## 2011-09-28 NOTE — Progress Notes (Signed)
INFECTIOUS DISEASES CLINIC  RFV: hospital follow up for recurrent c.difficile infection  Subjective:    Patient ID: David Davidson, male    DOB: Nov 28, 1910, 78 y.o.   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 He was diagnosed/hospitalized for PNA back in late November and was discharged to rehab center when his first episode of c.difficile colitis occurred. Since then, he has had 4 recurrences, 3 of which requiring hospitalization. He reports that he has lost 30 lb over the last 3-4 months since having these recurrent gi infections. During his hospitalizations, he occassional was also started on IV metronidazole in addition to oral vancomycin Since being discharged, he has been doing well back at home, where PT comes to work with him. He has not gained back any weight over the last 2 wks being at home. He reports that he is having solid bowel movements, roughly 1 a day thus far. He has a good appetite, and supplements his diet with 1 can of glucerna a day. He has finished his oral vancomycin yesterday and started taking rifaximin as of today.  Time line of hospitalizations and recurrences: Initial infection: 12/31-1/9 vanco 125mg  PO QID x 14d Recurrences: Jan 13- 18 hospitalized, discharged on vanco 125mg  PO QID x 14d Feb 2013- treated as o/p by PCP with metronidazole 500 TID x 14 d Mar 6-11, hospitalized, discharged on vancomycin taper: 125 QID x 7 addn day, 125 BID x 7d, 125 daily x 7d, 125 QOD x 7d Mar 29-April 4th, hospitalized, discharged on vancomycin 125 mg PO QID x 14 , and rifaximin taper  Prior to Admission medications   Medication Sig Start Date End Date Taking? Authorizing Provider  acetaminophen (TYLENOL) 500 MG tablet Take 1,000 mg by mouth at bedtime as needed. For pain    Yes Historical Provider, MD  aspirin 81 MG tablet Take 81 mg by mouth  every morning.    Yes Historical Provider, MD  cholecalciferol (VITAMIN D) 1000 UNITS tablet Take 2,000 Units by mouth every morning.    Yes Historical Provider, MD  ezetimibe (ZETIA) 10 MG tablet Take 10 mg by mouth at bedtime.    Yes Historical Provider, MD  feeding supplement (GLUCERNA SHAKE) LIQD Take 237 mLs by mouth 3 (three) times daily with meals. 08/21/11  Yes Srikar Cherlynn Kaiser, MD  finasteride (PROSCAR) 5 MG tablet Take 5 mg by mouth every morning.    Yes Historical Provider, MD  fish oil-omega-3 fatty acids 1000 MG capsule Take 1 g by mouth every morning.    Yes Historical Provider, MD  gemfibrozil (LOPID) 600 MG tablet Take 600 mg by mouth 2 (two) times daily before a meal.     Yes Historical Provider, MD  glyBURIDE (DIABETA) 5 MG tablet Take 5 mg by mouth.   Yes Antonieta Pert, MD  hydrocortisone cream 1 % Apply 1 application topically 2 (two) times daily. itchy back rash    Yes Historical Provider, MD  levothyroxine (SYNTHROID, LEVOTHROID) 25 MCG tablet Take 25 mcg by mouth daily.    Yes Historical Provider, MD  LORazepam (ATIVAN) 0.5 MG tablet Take 0.5 mg by mouth at bedtime as needed. For insomnia.   Yes Historical Provider, MD  mirtazapine (REMERON) 7.5 MG tablet Take 7.5 mg by mouth at bedtime.     Yes Historical Provider, MD  rifaximin (XIFAXAN) 200 MG tablet Take  1 tablet (200 mg total) by mouth 3 (three) times daily. Start 09/27/11 09/13/11  Yes Sosan Forrestine Him, MD  saccharomyces boulardii (FLORASTOR) 250 MG capsule Take 250 mg by mouth 2 (two) times daily. Take for 14 days   Yes Historical Provider, MD  Tamsulosin HCl (FLOMAX) 0.4 MG CAPS Take 0.4 mg by mouth every morning. Hold for SBP < 100.   Yes Historical Provider, MD  vancomycin (VANCOCIN) 50 mg/mL oral solution 125 mg po QID for 14 days 09/13/11  Yes Sosan Forrestine Him, MD  warfarin (COUMADIN) 5 MG tablet Take 1 tablet (5 mg total) by mouth every evening. 09/13/11  Yes Sosan Forrestine Him, MD  iron polysaccharides (NIFEREX) 150 MG  capsule Take 1 capsule (150 mg total) by mouth daily. 08/21/11 08/20/12  Cristal Ford, MD   Active Ambulatory Problems    Diagnosis Date Noted  . CAD (coronary artery disease) 05/09/2011  . Paroxysmal a-fib 05/09/2011  . DM (diabetes mellitus), type 2, uncontrolled with complications 05/09/2011  . Hypertension 05/09/2011  . S/P CABG (coronary artery bypass graft) 05/09/2011  . Hyperlipidemia 05/09/2011  . Hypothyroidism 05/29/2011  . Diarrhea 06/25/2011  . Weakness generalized 06/25/2011  . Dehydration 06/25/2011  . C. difficile colitis 06/30/2011  . Hypokalemia 08/16/2011  . Leukocytosis 08/16/2011  . Anemia 08/16/2011  . SIRS (systemic inflammatory response syndrome) 09/08/2011  . Thrombocytopenia 09/08/2011   Resolved Ambulatory Problems    Diagnosis Date Noted  . Community acquired bacterial pneumonia 05/09/2011  . Clostridium difficile colitis 05/29/2011  . Hypokalemia 05/29/2011  . Leukocytosis 05/29/2011  . UTI (lower urinary tract infection) 05/29/2011   Past Medical History  Diagnosis Date  . Myocardial infarct   . Diabetes mellitus   . CHF (congestive heart failure)   . Coronary artery disease   . Dysrhythmia   . Clostridium difficile diarrhea   . Cancer    Social hx = retired, a widow, lives by himself, with his son living in the home next door. Previous smoker, no alcohol. Uses walker to help with gait stability History   Social History  . Marital Status: Widowed    Spouse Name: N/A    Number of Children: N/A  . Years of Education: N/A   Social History Main Topics  . Smoking status: Former Smoker -- 1.0 packs/day    Types: Cigarettes    Quit date: 05/08/1974  . Smokeless tobacco: Never Used  . Alcohol Use: No     occasionally  . Drug Use: No  . Sexually Active: No   Other Topics Concern  . None   Social History Narrative  . None    Family hx= family history includes Breast cancer in his mother and Coronary artery disease in his  father.  Review of Systems Constitutional: Negative for fever, chills, diaphoresis,  appetite change. Positive for fatigue and #30 weight loss in 4 months HENT: Negative for congestion, sore throat, rhinorrhea, sneezing, trouble swallowing and sinus pressure.  Eyes: Negative for photophobia and visual disturbance.  Respiratory: Negative for cough, chest tightness, shortness of breath, wheezing and stridor.  Cardiovascular: Negative for chest pain, palpitations and leg swelling.  Gastrointestinal: Negative for nausea, vomiting, abdominal pain, diarrhea, constipation, blood in stool, abdominal distention and anal bleeding.  Genitourinary: Negative for dysuria, hematuria, flank pain and difficulty urinating.  Musculoskeletal: Negative for myalgias, back pain, joint swelling, arthralgias and gait problem.  Skin: Negative for color change, pallor, rash and wound.  Neurological: Negative for dizziness, tremors, weakness and light-headedness.  Hematological: Negative for adenopathy. Does not bruise/bleed easily.  Psychiatric/Behavioral: Negative for behavioral problems, confusion, sleep disturbance, dysphoric mood, decreased concentration and agitation.      Objective:   Physical Exam BP 119/70  Pulse 92  Temp(Src) 97.6 F (36.4 C) (Oral)  Wt 177 lb (80.287 kg)  Gen= elderly male in wheelchair, younger than stated age by 1 decade?, in NAD, A X O by 3 HEENT= [place]/AT. PERRLA, EOMI, no OP erythema Neck = supple, no LAD Lymph= no cervical LAD, no axillary LAD Pulm= CTAB, no w/c/r Cors= nl s1, s2, 3/6 SEM BH at apex radiating to RUSB/LUSB Abd= ntnd, scaphoid, BS+ Ext= trace pitting edema R>L due to history of SVG harvest     Assessment & Plan:  78 yo M with multiple medical problems who has had 3-4 episodes of recurrent c.difficile infections over the last 4 months, complicated by 30 lb weight loss. Currently has finished 14 days of vanco 125mg  QID, now on rifaxamin.  1) c.difficile  infection->  Recommend to continue with rifaxamin. If he starts to have 3 loose BM in 24hrs, I have asked the patient to call me so that we can re-initiate vancomycin 125mg  QID. Would like to refer to [facility] to evaluate for fecal bacteriotherapy.  2) weight loss -> increase his sup. Protein intake of glucerna to 3 cans/day. Weigh himself weekly, on the same scale. Give is a call weekly to see that he is gaining weight back.  rtc as needed.

## 2011-11-21 ENCOUNTER — Telehealth: Payer: Self-pay | Admitting: Internal Medicine

## 2011-11-21 ENCOUNTER — Telehealth: Payer: Self-pay | Admitting: *Deleted

## 2011-11-21 NOTE — Telephone Encounter (Signed)
Called patient to give him the follow up appt information. He has an appt with Dr Drue Second for Tuesday July 2 at 2 pm. Also needed to advised the patient to follow up with his PCP for his general health and that Dr Drue Second will call him later tonight. Had to leave a message for him to call back but will try him tomorrow.

## 2011-11-21 NOTE — Telephone Encounter (Signed)
Patient has been on prolonged vancomycin taper, which he finished roughly 2 wks ago. He now has had recurrence of 3 loose bowel movements in the last 12 hrs. We will re-initiate vancomycin taper. Called in rx to [facility]. Will have patient come into clinic to discuss options for fecal bacteriotherapy via enema vs. Going through FDA approval for colonoscopy delivered therapy.

## 2011-12-12 ENCOUNTER — Telehealth: Payer: Self-pay | Admitting: *Deleted

## 2011-12-12 ENCOUNTER — Encounter: Payer: Self-pay | Admitting: Internal Medicine

## 2011-12-12 ENCOUNTER — Ambulatory Visit (INDEPENDENT_AMBULATORY_CARE_PROVIDER_SITE_OTHER): Payer: Medicare Other | Admitting: Internal Medicine

## 2011-12-12 VITALS — BP 126/73 | HR 90 | Temp 97.4°F | Ht 73.0 in | Wt 196.8 lb

## 2011-12-12 DIAGNOSIS — A0472 Enterocolitis due to Clostridium difficile, not specified as recurrent: Secondary | ICD-10-CM

## 2011-12-12 NOTE — Telephone Encounter (Signed)
Called patient to advise of his appt with [facility] for Wednesday, July 10 @ 215 pm. Patient did not answer his phone had to leave a message with the appt info and advised him to call the clinic if he has any questions.

## 2011-12-20 ENCOUNTER — Ambulatory Visit (INDEPENDENT_AMBULATORY_CARE_PROVIDER_SITE_OTHER): Payer: Medicare Other | Admitting: Internal Medicine

## 2011-12-20 VITALS — BP 120/68 | HR 84 | Ht 73.0 in | Wt 198.8 lb

## 2011-12-20 DIAGNOSIS — Z7901 Long term (current) use of anticoagulants: Secondary | ICD-10-CM

## 2011-12-20 DIAGNOSIS — A0472 Enterocolitis due to Clostridium difficile, not specified as recurrent: Secondary | ICD-10-CM

## 2011-12-20 NOTE — Progress Notes (Signed)
ID CLINIC FOLLOW UP VISIT  RFV: recurrent c.difficile infection  Subjective:    Patient ID: Christiane Ha, male    DOB: 1910-12-23, 78 y.o.   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 He was diagnosed/hospitalized for pneumonia in late November 2012 and was discharged to rehab center when his first episode of c.difficile colitis occurred. Since then, he has had presumably 6 recurrences, 3 of which requiring hospitalization.We saw him in clinic in April as a follow up to moderate c.difficile hopsitalization(3rd hospitalization, 4th cdi). He was about to start rifaxamin "chaser" at that time. After finishing his 2 wks of rifaxamin, he was doing well up until 7-10days later, now May, where he started to have numerous diarrhea stools wihtin 12 hr period. He was reinitiated on vancomycin taper again. In mid [place] was nearly done with this taper at taking vancomycin every other day when his diarrhea returned, thus, he was scheduled into our clinic today to discuss FMT.    Back in April 2013, he had lost 30 lb over the last 3-4 months since having these recurrent gi infections. However, now, he is only down 5-10 lb from his baseline weight. He is doing well with exception to the diarrhea.  He is accompanied by his daughter and son at this appointment to discuss the FMT process and who would be likely stool donors. His son is undergoing BCG bladder infusions for bladder CA. His daughter who is in good health at this time did have a period of having chronic diarrhea, "yeast in my stool", in 2011. She had significant work by her internist including stool analysis ( microbiota analysis).  Mr. Spinney' daughter's husband is in good health, and has not had any recent antibiotics  I have provided literature to the family to what we would be the ideal "donor", as well as what  tests we would be doing on the donor, and donor stool. I have mentioned that these are generally not covered by insurance and that may be out of pocket.  I have explained to them what the protocol will be for stool donation, and how it will be infused as a colonoscopy and Mr. Bisono is agreeable in taking the next steps with meeting Dr. Leone Payor for consultation for FMT.  Time line of hospitalizations and recurrences:  Initial infection:  12/31-1/9 vanco 125mg  PO QID x 14d  Recurrences:  Jan 13- 18 hospitalized, discharged on vanco 125mg  PO QID x 14d  Feb 2013- treated as o/p by PCP with metronidazole 500 TID x 14 d  Mar 6-11, hospitalized, discharged on vancomycin taper: 125 QID x 7 addn day, 125 BID x 7d, 125 daily x 7d, 125 QOD x 7d  Mar 29-April 4th, hospitalized, discharged on vancomycin 125 mg PO QID x 14 , and rifaximin taper May 2013- prolonged vancomycin taper(as detailed above), florastor June 2013- prolonged vancomycin taper (as detailed above), florastor  Current Outpatient Prescriptions on File Prior to Visit  Medication Sig Dispense Refill  . acetaminophen (TYLENOL) 500 MG tablet Take 1,000 mg by mouth at bedtime as needed. For pain       . aspirin 81 MG tablet Take 81 mg by mouth every morning.       . cholecalciferol (VITAMIN D) 1000 UNITS tablet Take 2,000 Units by mouth every morning.       . ezetimibe (ZETIA) 10  MG tablet Take 10 mg by mouth at bedtime.       . feeding supplement (GLUCERNA SHAKE) LIQD Take 237 mLs by mouth 3 (three) times daily with meals.      . finasteride (PROSCAR) 5 MG tablet Take 5 mg by mouth every morning.       . fish oil-omega-3 fatty acids 1000 MG capsule Take 1 g by mouth every morning.       Marland Kitchen gemfibrozil (LOPID) 600 MG tablet Take 600 mg by mouth 2 (two) times daily before a meal.        . glyBURIDE (DIABETA) 5 MG tablet Take 5 mg by mouth.      . hydrocortisone cream 1 % Apply 1 application topically 2 (two) times daily. itchy back rash         . iron polysaccharides (NIFEREX) 150 MG capsule Take 1 capsule (150 mg total) by mouth daily.  30 capsule  0  . levothyroxine (SYNTHROID, LEVOTHROID) 25 MCG tablet Take 25 mcg by mouth daily.       Marland Kitchen LORazepam (ATIVAN) 0.5 MG tablet Take 0.5 mg by mouth at bedtime as needed. For insomnia.      Marland Kitchen saccharomyces boulardii (FLORASTOR) 250 MG capsule Take 250 mg by mouth 2 (two) times daily. Take for 14 days      . Tamsulosin HCl (FLOMAX) 0.4 MG CAPS Take 0.4 mg by mouth every morning. Hold for SBP < 100.      . vancomycin (VANCOCIN) 50 mg/mL oral solution 125 mg po QID for 14 days  140 mL  0  . warfarin (COUMADIN) 5 MG tablet Take 1 tablet (5 mg total) by mouth every evening.  30 tablet  0   Active Ambulatory Problems    Diagnosis Date Noted  . CAD (coronary artery disease) 05/09/2011  . Paroxysmal a-fib 05/09/2011  . DM (diabetes mellitus), type 2, uncontrolled with complications 05/09/2011  . Hypertension 05/09/2011  . S/P CABG (coronary artery bypass graft) 05/09/2011  . Hyperlipidemia 05/09/2011  . Hypothyroidism 05/29/2011  . Diarrhea 06/25/2011  . Weakness generalized 06/25/2011  . Dehydration 06/25/2011  . C. difficile colitis 06/30/2011  . Hypokalemia 08/16/2011  . Leukocytosis 08/16/2011  . Anemia 08/16/2011  . SIRS (systemic inflammatory response syndrome) 09/08/2011  . Thrombocytopenia 09/08/2011   Resolved Ambulatory Problems    Diagnosis Date Noted  . Community acquired bacterial pneumonia 05/09/2011  . Clostridium difficile colitis 05/29/2011  . Hypokalemia 05/29/2011  . Leukocytosis 05/29/2011  . UTI (lower urinary tract infection) 05/29/2011   Past Medical History  Diagnosis Date  . Myocardial infarct   . Diabetes mellitus   . CHF (congestive heart failure)   . Coronary artery disease   . Dysrhythmia   . Clostridium difficile diarrhea   . Cancer    History  Substance Use Topics  . Smoking status: Former Smoker -- 1.0 packs/day    Types: Cigarettes     Quit date: 05/08/1974  . Smokeless tobacco: Never Used  . Alcohol Use: No     occasionally   family history includes Breast cancer in his mother and Coronary artery disease in his father. Review of Systems   Constitutional: Negative for fever, chills, diaphoresis, activity change, appetite change, fatigue and unexpected weight change.  HENT: Negative for congestion, sore throat, rhinorrhea, sneezing, trouble swallowing and sinus pressure.  Eyes: Negative for photophobia and visual disturbance.  Respiratory: Negative for cough, chest tightness, shortness of breath, wheezing and stridor.  Cardiovascular: Negative for chest pain,  palpitations and leg swelling.  Gastrointestinal: per hpi Genitourinary: Negative for dysuria, hematuria, flank pain and difficulty urinating.  Musculoskeletal: Negative for myalgias, back pain, joint swelling, arthralgias and gait problem.  Skin: Negative for color change, pallor, rash and wound.  Neurological: Negative for dizziness, tremors, weakness and light-headedness.  Hematological: Negative for adenopathy. Does not bruise/bleed easily.  Psychiatric/Behavioral: Negative for behavioral problems, confusion, sleep disturbance, dysphoric mood, decreased concentration and agitation.       Objective:   Physical Exam  BP 126/73  Pulse 90  Temp 97.4 F (36.3 C) (Oral)  Ht 6\' 1"  (1.854 m)  Wt 196 lb 12 oz (89.245 kg)  BMI 25.96 kg/m2 Physical Exam  Constitutional: He is oriented to person, place, and time. He appears well-developed and well-nourished. No distress.  HENT: [place]/AT, PERRLA, EOMI, Oropharynx is clear and moist. No oropharyngeal exudate.  Cardiovascular: Normal rate, regular rhythm and normal heart sounds. Exam reveals no gallop and no friction rub. 2 of 6 SEM BH at LUSB Pulmonary/Chest: Effort normal and breath sounds normal. No respiratory distress. He has no wheezes.  Abdominal: Soft. Bowel sounds are normal. He exhibits no distension. There is  no tenderness.  Lymphadenopathy:  no cervical adenopathy Skin: Skin is warm and dry. No rash noted. No erythema.  Psychiatric: He has a normal mood and affect. His behavior is normal.      Assessment & Plan:  Recurrent cdi = will continue taking his PO vancomycin at the current schedule of his vanco taper. Will refer to see Dr. Leone Payor at [facility] for eval for colonoscopy for FMT.  Potential Donor = likely candidates would be the daughter's husband or his daughter now that she recovered from her chronic diarrhea over a year ago.  Af of June 18th 2013, The FDA has loosen their restrictions from May 2013. Providers no longer require IND, but recommend an informed consent for the procedure.   Likely testing of donor will be: HAV, HBV,HCV, HIV, RPR and the stool should be tested for c.difficile +/- stool culture.  Will discuss with Dr. Leone Payor if he feels that Mr. Velador would benefit from FMT. Another option is to do fecal enema. Vs. See if [facility] has still placed their FMT program on hold.  Greater than 45 min spent with patient, 50% in counseling for next steps of treatment.  Duke Salvia Drue Second MD MPH [facility] [phone]

## 2011-12-20 NOTE — Progress Notes (Addendum)
Subjective:    Patient ID: David Davidson, male    DOB: [birth year], 78 y.o.   MRN: [id number] Referred by: Judyann Munson, MD  HPI The patient is a 78 year old white man with chronic recurrent Clostridium difficile colitis. He has been treated with multiple therapies that include vancomycin and Xifaxan yet he is unable to maintain a durable remission. He is being considered for a fecal transplant. I was asked to see him regarding this. He is currently having one semi-formed stool a day, no dominant pain or fever. There is no rectal bleeding. His bowel habits have been this way for a couple of weeks now. He is a robust 78 year old person he walks, he is slightly unsteady and does use a cane but does not have falls. He takes warfarin for many years related to heart disease. His problems started in late 2012 when he had community-acquired pneumonia. She with antibiotics then subsequently developed Clostridium difficile colitis and he has been admitted 4 times in 2013 because of Clostridium difficile colitis and associated illness. He has been treated as outlined below  Time line of hospitalizations and recurrences:  Initial infection:  12/31-1/9 vanco 125mg  PO QID x 14d  Recurrences:  Jan 13- 18 hospitalized, discharged on vanco 125mg  PO QID x 14d  Feb 2013- treated as o/p by PCP with metronidazole 500 TID x 14 d  Mar 6-11, hospitalized, discharged on vancomycin taper: 125 QID x 7 addn day, 125 BID x 7d, 125 daily x 7d, 125 QOD x 7d  Mar 29-April 4th, hospitalized, discharged on vancomycin 125 mg PO QID x 14 , and rifaximin taper  May 2013- prolonged vancomycin taper, florastor  June 2013- prolonged vancomycin taper, florastor  Allergies  Allergen Reactions  . Morphine And Related Other (See Comments)    Reaction unknown   Outpatient Prescriptions Prior to Visit  Medication Sig Dispense Refill  . acetaminophen (TYLENOL) 500 MG tablet Take 1,000 mg by mouth at bedtime as needed. For pain        . aspirin 81 MG tablet Take 81 mg by mouth every morning.       . cholecalciferol (VITAMIN D) 1000 UNITS tablet Take 2,000 Units by mouth every morning.       . ezetimibe (ZETIA) 10 MG tablet Take 10 mg by mouth at bedtime.       . feeding supplement (GLUCERNA SHAKE) LIQD Take 237 mLs by mouth 3 (three) times daily with meals.      . finasteride (PROSCAR) 5 MG tablet Take 5 mg by mouth every morning.       . fish oil-omega-3 fatty acids 1000 MG capsule Take 1 g by mouth every morning.       Marland Kitchen gemfibrozil (LOPID) 600 MG tablet Take 600 mg by mouth 2 (two) times daily before a meal.        . glyBURIDE (DIABETA) 5 MG tablet Take 5 mg by mouth as needed.       . hydrocortisone cream 1 % Apply 1 application topically 2 (two) times daily. itchy back rash       . levothyroxine (SYNTHROID, LEVOTHROID) 25 MCG tablet Take 25 mcg by mouth daily.       Marland Kitchen LORazepam (ATIVAN) 0.5 MG tablet Take 0.5 mg by mouth at bedtime as needed. For insomnia.      Marland Kitchen vancomycin (VANCOCIN) 50 mg/mL oral solution 125 mg po QID for 14 days  140 mL  0  . warfarin (COUMADIN) 5 MG tablet Take  1 tablet (5 mg total) by mouth every evening.  30 tablet  0  . iron polysaccharides (NIFEREX) 150 MG capsule Take 1 capsule (150 mg total) by mouth daily.  30 capsule  0  . saccharomyces boulardii (FLORASTOR) 250 MG capsule Take 250 mg by mouth 2 (two) times daily. Take for 14 days      . Tamsulosin HCl (FLOMAX) 0.4 MG CAPS Take 0.4 mg by mouth every morning. Hold for SBP < 100.       Past Medical History  Diagnosis Date  . Myocardial infarct   . Diabetes mellitus   . Hypertension   . CHF (congestive heart failure)   . Coronary artery disease   . Dysrhythmia   . Clostridium difficile diarrhea     recurrent   . Cancer     Bladder cancer  . Hyperlipidemia   . Pneumonia 04/2011   paroxysmal atrial fibrillation Past Surgical History  Procedure Date  . Coronary artery bypass graft   . Cholecystectomy   . Cardiac catheterization  10/10/2001  . Cystoscopy, turbt 05/09/2006, 02/19/2006, 04/19/2005, 04/21/2003   History   Social History  . Marital Status: Widowed    Spouse Name: N/A    Number of Children: 2  .     Occupational History  . Retired    Social History Main Topics  . Smoking status: Former Smoker -- 1.0 packs/day    Types: Cigarettes    Quit date: 05/08/1974  . Smokeless tobacco: Never Used  . Alcohol Use: No     occasionally  . Drug Use: No  . Sexually Active: No     Family History  Problem Relation Age of Onset  . Stomach cancer Mother   . Coronary artery disease Father   . Diabetes Son   . Colon cancer Neg Hx       Review of Systems He ambulates with a cane, he has pedal edema and indicate she has a heart murmur. All other review of systems are negative or accept as per history of present illness. Note that he has had some weight loss but typically with his hospitalizations though he has regained weight throughout the year.    Objective:   Physical Exam General:  Well-developed, well-nourished and in no acute distress elderly younger than stated age Eyes:  anicteric. ENT:   Mouth and posterior pharynx free of lesions.  Neck:   supple w/o thyromegaly or mass.  Lungs: Clear to auscultation bilaterally. Heart:  S1S2, 3/6 murmur of aortic stenosis no gallops. Abdomen:  soft, non-tender, no hepatosplenomegaly, hernia, or mass and BS+.  Lymph:  no cervical or supraclavicular adenopathy. Extremities:   no edema Skin  scaly chronic patchy rash on abdominal wall Neuro:  A&O x 3.  Psych:  appropriate mood and  Affect.   Data Reviewed: Hospital records, laboratory testing, infectious disease office notes       Assessment & Plan:   1. C. difficile colitis chronic and recurrent, refractory to aggressive and prolonged vancomycin therapy and other therapy   He is currently doing reasonably well but remains on vancomycin. I do think it is reasonable to pursue a fecal transplant because of  his recurrent relapses despite aggressive prolonged tapering therapy with vancomycin and the data that shows excellent response to fecal transplant in situations like his.. I've explained the risks benefits and indications of colonoscopy. I do think a perforation and 78 year old would probably be fatal, and that if we undertook colonoscopy introduction of donor feces,  would be ileal intubation with the positing transplanted feces throughout the colon. However if there was technical difficulty are concerned about increasing the risk of perforation I would not advance all the way. We discussed possible nasogastric tube or enema with retention therapy. He prefers to attempt a colonoscopy.  I've explained to the patient, his son and daughter that this has not been done in [place] though outside of preparing the donor stool the technical aspects of it are things that we have done and other situations. He is willing to accept this risk.  I will discuss with Dr. Drue Second, and we will get back to the patient and his family. The patient's son indicated that his son is willing to be a stool donor, he is 85 and healthy. The patient's son has had chemotherapy for bladder cancer and that has raised some concerns about the suitability of his stool. The daughter has been treated for yeast in her stool due to excessive gas. She did show me some stool analysis profile with the bacteria isolated from her stool. She was put on a very restricted diet and given Florastor and feels better. I suspect that the yeast in her stool was not a pathogen but was normal flora and that she has responded to dietary manipulation and probiotics and most likely has irritable bowel syndrome. She could potentially be a donor I believe.   I did explain that tertiary centers such as [facility] have done this and that was a potential option, he would prefer to have it done locally, if possible.   2. Warfarin anticoagulation    This would not need to be held for his colonoscopy though could slightly increased risk of bleeding. I've explained that polypectomy would not be done on warfarin therapy. At a minimum would probably recheck his INR prior to any colonoscopy.    CC: [name], MD and Judyann Munson, MD

## 2011-12-20 NOTE — Assessment & Plan Note (Signed)
transplant

## 2011-12-20 NOTE — Patient Instructions (Addendum)
Dr. Stan Head will be consulting with Dr. Judyann Munson regarding your case and will be in touch about proceeding.  Thank you for choosing [facility] for your healthcare needs today.

## 2011-12-27 ENCOUNTER — Other Ambulatory Visit: Payer: Self-pay | Admitting: Internal Medicine

## 2012-01-10 ENCOUNTER — Telehealth: Payer: Self-pay

## 2012-01-10 NOTE — Telephone Encounter (Signed)
Message copied by Annett Fabian on Wed Jan 10, 2012  3:28 PM ------      Message from: Stan Head E      Created: Wed Jan 10, 2012  6:41 AM      Regarding: schedule colonoscopy-fecal transplant aug 21       Need to schedule colonoscopy with fecal transplant for C diff at [facility] endoscopy             Aug 21 early PM 130 PM or later please            Dr. Judyann Munson of ID will be assisting            Please copy her back on response            Also need to see Mr. Guggenheim again to do prep instructions, etc            Do not want direct schedule

## 2012-01-10 NOTE — Telephone Encounter (Signed)
Patient is scheduled for colonoscopy and fecal transplant at [facility] for 01/31/12 1:30.  He will come into the office for instructions 01/19/12 11:15.  His daughter will be the donor.  Does she need to accompany him to this appt?

## 2012-01-10 NOTE — Telephone Encounter (Signed)
Not necessarily We will give her instructions about collecting the stool separately, Dr. Drue Second will probably handle that with me

## 2012-01-15 ENCOUNTER — Other Ambulatory Visit: Payer: Self-pay | Admitting: Internal Medicine

## 2012-01-15 NOTE — Progress Notes (Signed)
On 01/15/12: called in an RX for oral vancomycin 125mg  QID x 14 day followed by BID dosing for 7 days. In anticipation for fecal transplant, tentatively planned for 01/31/12

## 2012-01-16 ENCOUNTER — Other Ambulatory Visit: Payer: Self-pay

## 2012-01-16 DIAGNOSIS — A0472 Enterocolitis due to Clostridium difficile, not specified as recurrent: Secondary | ICD-10-CM

## 2012-01-19 ENCOUNTER — Ambulatory Visit (INDEPENDENT_AMBULATORY_CARE_PROVIDER_SITE_OTHER): Payer: Medicare Other | Admitting: Internal Medicine

## 2012-01-19 ENCOUNTER — Encounter: Payer: Self-pay | Admitting: Internal Medicine

## 2012-01-19 VITALS — BP 120/64 | HR 80 | Ht 73.0 in | Wt 202.0 lb

## 2012-01-19 DIAGNOSIS — A0472 Enterocolitis due to Clostridium difficile, not specified as recurrent: Secondary | ICD-10-CM

## 2012-01-19 MED ORDER — MOVIPREP 100 G PO SOLR: ORAL | Status: DC

## 2012-01-19 NOTE — Patient Instructions (Addendum)
You have been scheduled for a colonoscopy with propofol. Please follow written instructions given to you at your visit today.  Please pick up your prep kit at the pharmacy within the next 1-3 days. If you use inhalers (even only as needed), please bring them with you on the day of your procedure.  Thank you for choosing me and [facility].  Carl E. Gessner, M.D., FACG  

## 2012-01-19 NOTE — Progress Notes (Signed)
Patient ID: David Davidson, male   DOB: 05/13/1911, 78 y.o.   MRN: [id number]   Patient presents for followup of his Clostridium difficile colitis. He is on vancomycin and is not having symptoms. We have a fecal transplant from his daughter planned for August 21. I reviewed the nature, risks benefits and indications of colonoscopy with delivery of a stool transplant. We have been through this before, he is here to receive prep instructions as well.  Allergies  Allergen Reactions  . Morphine And Related Other (See Comments)    Reaction unknown   Outpatient Prescriptions Prior to Visit  Medication Sig Dispense Refill  . acetaminophen (TYLENOL) 500 MG tablet Take 1,000 mg by mouth at bedtime as needed. For pain       . aspirin 81 MG tablet Take 81 mg by mouth every morning.       . cholecalciferol (VITAMIN D) 1000 UNITS tablet Take 2,000 Units by mouth every morning.       . ezetimibe (ZETIA) 10 MG tablet Take 10 mg by mouth at bedtime.       . feeding supplement (GLUCERNA SHAKE) LIQD Take 237 mLs by mouth 3 (three) times daily with meals.      . finasteride (PROSCAR) 5 MG tablet Take 5 mg by mouth every morning.       . fish oil-omega-3 fatty acids 1000 MG capsule Take 1 g by mouth every morning.       . gemfibrozil (LOPID) 600 MG tablet Take 600 mg by mouth 2 (two) times daily before a meal.        . glyBURIDE (DIABETA) 5 MG tablet Take 5 mg by mouth as needed.       . hydrocortisone cream 1 % Apply 1 application topically 2 (two) times daily. itchy back rash       . levothyroxine (SYNTHROID, LEVOTHROID) 25 MCG tablet Take 25 mcg by mouth daily.       . LORazepam (ATIVAN) 0.5 MG tablet Take 0.5 mg by mouth at bedtime as needed. For insomnia.      . Multiple Vitamin (MULTIVITAMIN) tablet Take 1 tablet by mouth daily.      . vancomycin (VANCOCIN) 50 mg/mL oral solution 125 mg po QID for 14 days  140 mL  0  . warfarin (COUMADIN) 5 MG tablet Take 1 tablet (5 mg total) by mouth every evening.  30  tablet  0   Past Medical History  Diagnosis Date  . Myocardial infarct   . Diabetes mellitus   . Hypertension   . CHF (congestive heart failure)   . Coronary artery disease   . Dysrhythmia   . Clostridium difficile diarrhea     recurrent   . Cancer     Bladder cancer  . Hyperlipidemia   . Pneumonia 04/2011  . Paroxysmal atrial fibrillation    Past Surgical History  Procedure Date  . Coronary artery bypass graft   . Cholecystectomy   . Cardiac catheterization 10/10/2001  . Cystoscopy, turbt 05/09/2006, 02/19/2006, 04/19/2005, 04/21/2003   History   Social History  . Marital Status: Widowed    Spouse Name: N/A    Number of Children: 2  . Years of Education: N/A   Occupational History  . Retired    Social History Main Topics  . Smoking status: Former Smoker -- 1.0 packs/day    Types: Cigarettes    Quit date: 05/08/1974  . Smokeless tobacco: Never Used  . Alcohol Use: No       occasionally  . Drug Use: No  . Sexually Active: No   Other Topics Concern  . None   Social History Narrative  . None   Family History  Problem Relation Age of Onset  . Stomach cancer Mother   . Coronary artery disease Father   . Diabetes Son   . Colon cancer Neg Hx        Physical  Well-developed well-nourished elderly white man appearing younger than his stated age of 78.   Assessment and plan:  1. C. difficile colitis    1. 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 All questions answered. The patient agrees to proceed. 2. He will be on his warfarin and aspirin for the procedure, do not anticipate biopsies or polypectomy, I explained this to the patient. If he does need biopsies that would be okay but I do not anticipate removing colon polyps in this man at 89 and with the other intent of the procedure being to deliver fecal transplant to  treat recurrent C. difficile colitis refractory to standard therapies.  I appreciate the opportunity to care for this patient.   CC: MACKENZIE,BRIAN, MD and CYNTHIA SNIDER, MD  

## 2012-01-31 ENCOUNTER — Encounter (HOSPITAL_COMMUNITY): Payer: Self-pay

## 2012-01-31 ENCOUNTER — Encounter (HOSPITAL_COMMUNITY)
Admission: RE | Disposition: A | Discharge status: Home / Self Care | Payer: Self-pay | Source: Ambulatory Visit | Attending: Internal Medicine

## 2012-01-31 ENCOUNTER — Ambulatory Visit (HOSPITAL_COMMUNITY)
Admission: RE | Admit: 2012-01-31 | Discharge: 2012-01-31 | Disposition: A | Discharge status: Home / Self Care | Payer: Medicare Other | Source: Ambulatory Visit | Attending: Internal Medicine | Admitting: Internal Medicine

## 2012-01-31 DIAGNOSIS — K5730 Diverticulosis of large intestine without perforation or abscess without bleeding: Secondary | ICD-10-CM | POA: Insufficient documentation

## 2012-01-31 DIAGNOSIS — A0472 Enterocolitis due to Clostridium difficile, not specified as recurrent: Secondary | ICD-10-CM

## 2012-01-31 HISTORY — PX: COLONOSCOPY: SHX5424

## 2012-01-31 LAB — GLUCOSE, CAPILLARY: Glucose-Capillary: 126 mg/dL — ABNORMAL HIGH (ref 70–99)

## 2012-01-31 SURGERY — COLONOSCOPY
Anesthesia: Moderate Sedation

## 2012-01-31 MED ORDER — FENTANYL CITRATE 0.05 MG/ML IJ SOLN
INTRAMUSCULAR | Status: AC
  Filled 2012-01-31: qty 2

## 2012-01-31 MED ORDER — MIDAZOLAM HCL 10 MG/2ML IJ SOLN
INTRAMUSCULAR | Status: AC
  Filled 2012-01-31: qty 2

## 2012-01-31 MED ORDER — MIDAZOLAM HCL 5 MG/5ML IJ SOLN
INTRAMUSCULAR | Status: DC | PRN
  Administered 2012-01-31 (×3): 1 mg via INTRAVENOUS

## 2012-01-31 MED ORDER — LOPERAMIDE HCL 2 MG PO CAPS
4.0000 mg | ORAL_CAPSULE | Freq: Once | ORAL | Status: AC
  Administered 2012-01-31: 4 mg via ORAL
  Filled 2012-01-31 (×2): qty 2

## 2012-01-31 MED ORDER — FENTANYL CITRATE 0.05 MG/ML IJ SOLN
INTRAMUSCULAR | Status: DC | PRN
  Administered 2012-01-31: 10 ug via INTRAVENOUS
  Administered 2012-01-31: 25 ug via INTRAVENOUS

## 2012-01-31 MED ORDER — SODIUM CHLORIDE 0.9 % IV SOLN
INTRAVENOUS | Status: DC
  Administered 2012-01-31: 500 mL via INTRAVENOUS

## 2012-01-31 MED ORDER — NYSTATIN-TRIAMCINOLONE 100000-0.1 UNIT/GM-% EX OINT: TOPICAL_OINTMENT | Freq: Two times a day (BID) | CUTANEOUS | Status: DC

## 2012-01-31 NOTE — Op Note (Signed)
[facility] [address] [place], [address]   COLONOSCOPY PROCEDURE REPORT  PATIENT: David Davidson, David Davidson  MR#: [id number] BIRTHDATE: 02/21/1911 , 89  yrs. old GENDER: Male ENDOSCOPIST: Iva Boop, MD, [facility] REFERRED WN:[id number] Drue Second, M.D. 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 MEDICATIONS: Fentanyl 35 mcg IV and Versed 3 mg IV  DESCRIPTION OF PROCEDURE:   After the risks benefits and alternatives of the procedure were thoroughly explained, informed consent was obtained.  A digital rectal exam revealed no abnormalities of the rectum.   The Pentax Colonoscope [id number] endoscope was introduced through the anus and advanced to the terminal ileum which was intubated for a short distance. No adverse events experienced.   The quality of the prep was excellent, using MoviPrep  The instrument was then slowly withdrawn as the colon was fully examined.      COLON FINDINGS: Mild diverticulosis was noted The finding was in the left colon.   The colon mucosa was otherwise normal though attention to detail not given as purpose was for fecal transplant. delivered - 240 cc into terminal ileum and 240 cc into right colon. Retroflexion was not performed.          The scope was withdrawn and the procedure completed. COMPLICATIONS: There were no complications. ENDOSCOPIC IMPRESSION: 1.   Mild diverticulosis was noted in the left colon 2.   The colon mucosa was otherwise normal limited by the fact that attention to detail not given on withdrawal. 3.   Normal mucosa in the terminal ileum  RECOMMENDATIONS: Observe off vancomycin to see if he clears C.  diff.  eSigned:  Iva Boop, MD, [facility] 01/31/2012 2:05 PM   cc: [name], MD and The Patient

## 2012-01-31 NOTE — H&P (View-Only) (Signed)
Patient ID: David Davidson, male   DOB: Nov 02, 1910, 77 y.o.   MRN: [id number]   Patient presents for followup of his Clostridium difficile colitis. He is on vancomycin and is not having symptoms. We have a fecal transplant from his daughter planned for August 21. I reviewed the nature, risks benefits and indications of colonoscopy with delivery of a stool transplant. We have been through this before, he is here to receive prep instructions as well.  Allergies  Allergen Reactions  . Morphine And Related Other (See Comments)    Reaction unknown   Outpatient Prescriptions Prior to Visit  Medication Sig Dispense Refill  . acetaminophen (TYLENOL) 500 MG tablet Take 1,000 mg by mouth at bedtime as needed. For pain       . aspirin 81 MG tablet Take 81 mg by mouth every morning.       . cholecalciferol (VITAMIN D) 1000 UNITS tablet Take 2,000 Units by mouth every morning.       . ezetimibe (ZETIA) 10 MG tablet Take 10 mg by mouth at bedtime.       . feeding supplement (GLUCERNA SHAKE) LIQD Take 237 mLs by mouth 3 (three) times daily with meals.      . finasteride (PROSCAR) 5 MG tablet Take 5 mg by mouth every morning.       . fish oil-omega-3 fatty acids 1000 MG capsule Take 1 g by mouth every morning.       Marland Kitchen gemfibrozil (LOPID) 600 MG tablet Take 600 mg by mouth 2 (two) times daily before a meal.        . glyBURIDE (DIABETA) 5 MG tablet Take 5 mg by mouth as needed.       . hydrocortisone cream 1 % Apply 1 application topically 2 (two) times daily. itchy back rash       . levothyroxine (SYNTHROID, LEVOTHROID) 25 MCG tablet Take 25 mcg by mouth daily.       Marland Kitchen LORazepam (ATIVAN) 0.5 MG tablet Take 0.5 mg by mouth at bedtime as needed. For insomnia.      . Multiple Vitamin (MULTIVITAMIN) tablet Take 1 tablet by mouth daily.      . vancomycin (VANCOCIN) 50 mg/mL oral solution 125 mg po QID for 14 days  140 mL  0  . warfarin (COUMADIN) 5 MG tablet Take 1 tablet (5 mg total) by mouth every evening.  30  tablet  0   Past Medical History  Diagnosis Date  . Myocardial infarct   . Diabetes mellitus   . Hypertension   . CHF (congestive heart failure)   . Coronary artery disease   . Dysrhythmia   . Clostridium difficile diarrhea     recurrent   . Cancer     Bladder cancer  . Hyperlipidemia   . Pneumonia 04/2011  . Paroxysmal atrial fibrillation    Past Surgical History  Procedure Date  . Coronary artery bypass graft   . Cholecystectomy   . Cardiac catheterization 10/10/2001  . Cystoscopy, turbt 05/09/2006, 02/19/2006, 04/19/2005, 04/21/2003   History   Social History  . Marital Status: Widowed    Spouse Name: N/A    Number of Children: 2  . Years of Education: N/A   Occupational History  . Retired    Social History Main Topics  . Smoking status: Former Smoker -- 1.0 packs/day    Types: Cigarettes    Quit date: 05/08/1974  . Smokeless tobacco: Never Used  . Alcohol Use: No  occasionally  . Drug Use: No  . Sexually Active: No   Other Topics Concern  . None   Social History Narrative  . None   Family History  Problem Relation Age of Onset  . Stomach cancer Mother   . Coronary artery disease Father   . Diabetes Son   . Colon cancer Neg Hx        Physical  Well-developed well-nourished elderly white man appearing younger than his stated age of 17.   Assessment and plan:  1. C. difficile colitis    1. 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 All questions answered. The patient agrees to proceed. 2. He will be on his warfarin and aspirin for the procedure, do not anticipate biopsies or polypectomy, I explained this to the patient. If he does need biopsies that would be okay but I do not anticipate removing colon polyps in this man at 18 and with the other intent of the procedure being to deliver fecal transplant to  treat recurrent C. difficile colitis refractory to standard therapies.  I appreciate the opportunity to care for this patient.   CC: [name], MD and Judyann Munson, MD

## 2012-01-31 NOTE — Interval H&P Note (Signed)
History and Physical Interval Note:  01/31/2012 1:15 PM  David Davidson  has presented today for surgery, with the diagnosis of c-diff  The various methods of treatment have been discussed with the patient and family. After consideration of risks, benefits and other options for treatment, the patient has consented to  Procedure(s) (LRB): COLONOSCOPY (N/A) as a surgical intervention .  The patient's history has been reviewed, patient examined, no change in status, stable for surgery.  I have reviewed the patient's chart and labs.  Questions were answered to the patient's satisfaction.    See pre-sedation eval for physical exam  Stan Head, MD, [facility]

## 2012-02-01 ENCOUNTER — Encounter (HOSPITAL_COMMUNITY): Payer: Self-pay

## 2012-02-01 ENCOUNTER — Encounter (HOSPITAL_COMMUNITY): Payer: Self-pay | Admitting: Internal Medicine

## 2012-08-20 ENCOUNTER — Encounter (HOSPITAL_COMMUNITY): Payer: Self-pay | Admitting: *Deleted

## 2012-08-20 ENCOUNTER — Emergency Department (HOSPITAL_COMMUNITY): Payer: Medicare Other

## 2012-08-20 ENCOUNTER — Inpatient Hospital Stay (HOSPITAL_COMMUNITY)
Admission: EM | Admit: 2012-08-20 | Discharge: 2012-08-23 | DRG: 293 | Disposition: A | Discharge status: Home / Self Care | Payer: Medicare Other | Attending: Internal Medicine | Admitting: Internal Medicine

## 2012-08-20 DIAGNOSIS — Z791 Long term (current) use of non-steroidal anti-inflammatories (NSAID): Secondary | ICD-10-CM

## 2012-08-20 DIAGNOSIS — R079 Chest pain, unspecified: Secondary | ICD-10-CM | POA: Diagnosis present

## 2012-08-20 DIAGNOSIS — Z7901 Long term (current) use of anticoagulants: Secondary | ICD-10-CM

## 2012-08-20 DIAGNOSIS — E119 Type 2 diabetes mellitus without complications: Secondary | ICD-10-CM | POA: Diagnosis present

## 2012-08-20 DIAGNOSIS — I5033 Acute on chronic diastolic (congestive) heart failure: Principal | ICD-10-CM

## 2012-08-20 DIAGNOSIS — D649 Anemia, unspecified: Secondary | ICD-10-CM

## 2012-08-20 DIAGNOSIS — M25519 Pain in unspecified shoulder: Secondary | ICD-10-CM | POA: Diagnosis present

## 2012-08-20 DIAGNOSIS — E785 Hyperlipidemia, unspecified: Secondary | ICD-10-CM | POA: Diagnosis present

## 2012-08-20 DIAGNOSIS — I5021 Acute systolic (congestive) heart failure: Secondary | ICD-10-CM | POA: Diagnosis present

## 2012-08-20 DIAGNOSIS — IMO0002 Reserved for concepts with insufficient information to code with codable children: Secondary | ICD-10-CM

## 2012-08-20 DIAGNOSIS — I2510 Atherosclerotic heart disease of native coronary artery without angina pectoris: Secondary | ICD-10-CM

## 2012-08-20 DIAGNOSIS — E118 Type 2 diabetes mellitus with unspecified complications: Secondary | ICD-10-CM

## 2012-08-20 DIAGNOSIS — I10 Essential (primary) hypertension: Secondary | ICD-10-CM

## 2012-08-20 DIAGNOSIS — I480 Paroxysmal atrial fibrillation: Secondary | ICD-10-CM

## 2012-08-20 DIAGNOSIS — Z9089 Acquired absence of other organs: Secondary | ICD-10-CM

## 2012-08-20 DIAGNOSIS — Z8551 Personal history of malignant neoplasm of bladder: Secondary | ICD-10-CM

## 2012-08-20 DIAGNOSIS — Z66 Do not resuscitate: Secondary | ICD-10-CM | POA: Diagnosis present

## 2012-08-20 DIAGNOSIS — I509 Heart failure, unspecified: Secondary | ICD-10-CM | POA: Diagnosis present

## 2012-08-20 DIAGNOSIS — Z951 Presence of aortocoronary bypass graft: Secondary | ICD-10-CM

## 2012-08-20 DIAGNOSIS — Z7982 Long term (current) use of aspirin: Secondary | ICD-10-CM

## 2012-08-20 DIAGNOSIS — Z885 Allergy status to narcotic agent status: Secondary | ICD-10-CM

## 2012-08-20 DIAGNOSIS — Z87891 Personal history of nicotine dependence: Secondary | ICD-10-CM

## 2012-08-20 DIAGNOSIS — Z8249 Family history of ischemic heart disease and other diseases of the circulatory system: Secondary | ICD-10-CM

## 2012-08-20 DIAGNOSIS — E039 Hypothyroidism, unspecified: Secondary | ICD-10-CM | POA: Diagnosis present

## 2012-08-20 DIAGNOSIS — I4891 Unspecified atrial fibrillation: Secondary | ICD-10-CM | POA: Diagnosis present

## 2012-08-20 DIAGNOSIS — Z79899 Other long term (current) drug therapy: Secondary | ICD-10-CM

## 2012-08-20 DIAGNOSIS — I319 Disease of pericardium, unspecified: Secondary | ICD-10-CM

## 2012-08-20 DIAGNOSIS — I252 Old myocardial infarction: Secondary | ICD-10-CM

## 2012-08-20 DIAGNOSIS — E1165 Type 2 diabetes mellitus with hyperglycemia: Secondary | ICD-10-CM

## 2012-08-20 LAB — GLUCOSE, CAPILLARY
Glucose-Capillary: 133 mg/dL — ABNORMAL HIGH (ref 70–99)
Glucose-Capillary: 163 mg/dL — ABNORMAL HIGH (ref 70–99)

## 2012-08-20 LAB — COMPREHENSIVE METABOLIC PANEL
AST: 18 U/L (ref 0–37)
CO2: 26 mEq/L (ref 19–32)
Calcium: 9 mg/dL (ref 8.4–10.5)
Creatinine, Ser: 0.93 mg/dL (ref 0.50–1.35)
GFR calc non Af Amer: 67 mL/min — ABNORMAL LOW (ref >90)
Sodium: 141 mEq/L (ref 135–145)
Total Protein: 7.4 g/dL (ref 6.0–8.3)

## 2012-08-20 LAB — CBC WITH DIFFERENTIAL/PLATELET
Basophils Absolute: 0 10*3/uL (ref 0.0–0.1)
Basophils Relative: 0 % (ref 0–1)
Hemoglobin: 12.4 g/dL — ABNORMAL LOW (ref 13.0–17.0)
Lymphs Abs: 1.9 10*3/uL (ref 0.7–4.0)
Monocytes Relative: 16 % — ABNORMAL HIGH (ref 3–12)
Neutro Abs: 2.3 10*3/uL (ref 1.7–7.7)
Neutrophils Relative %: 44 % (ref 43–77)
RBC: 3.84 MIL/uL — ABNORMAL LOW (ref 4.22–5.81)
WBC: 5.3 10*3/uL (ref 4.0–10.5)

## 2012-08-20 LAB — MRSA PCR SCREENING: MRSA by PCR: POSITIVE — AB

## 2012-08-20 LAB — TROPONIN I
Troponin I: <0.3 ng/mL (ref <0.30)
Troponin I: <0.3 ng/mL (ref <0.30)
Troponin I: <0.3 ng/mL (ref <0.30)

## 2012-08-20 LAB — PROTIME-INR
INR: 2.24 — ABNORMAL HIGH (ref 0.00–1.49)
Prothrombin Time: 23.8 seconds — ABNORMAL HIGH (ref 11.6–15.2)

## 2012-08-20 MED ORDER — SODIUM CHLORIDE 0.9 % IJ SOLN
3.0000 mL | Freq: Two times a day (BID) | INTRAMUSCULAR | Status: DC
Start: 2012-08-20 — End: 2012-08-23
  Administered 2012-08-20 – 2012-08-23 (×7): 3 mL via INTRAVENOUS

## 2012-08-20 MED ORDER — INSULIN ASPART 100 UNIT/ML ~~LOC~~ SOLN
0.0000 [IU] | Freq: Three times a day (TID) | SUBCUTANEOUS | Status: DC
  Administered 2012-08-20: 15:00:00 via SUBCUTANEOUS
  Administered 2012-08-20: 2 [IU] via SUBCUTANEOUS
  Administered 2012-08-21: 1 [IU] via SUBCUTANEOUS
  Administered 2012-08-21: 3 [IU] via SUBCUTANEOUS
  Administered 2012-08-22 (×3): 2 [IU] via SUBCUTANEOUS
  Administered 2012-08-23: 5 [IU] via SUBCUTANEOUS
  Administered 2012-08-23: 2 [IU] via SUBCUTANEOUS

## 2012-08-20 MED ORDER — FUROSEMIDE 10 MG/ML IJ SOLN
40.0000 mg | Freq: Two times a day (BID) | INTRAMUSCULAR | Status: DC
  Administered 2012-08-20 – 2012-08-22 (×4): 40 mg via INTRAVENOUS
  Filled 2012-08-20 (×6): qty 4

## 2012-08-20 MED ORDER — OMEGA-3 FATTY ACIDS 1000 MG PO CAPS: 1.0000 g | ORAL_CAPSULE | ORAL | Status: DC

## 2012-08-20 MED ORDER — GLUCERNA SHAKE PO LIQD
237.0000 mL | Freq: Every day | ORAL | Status: DC
  Administered 2012-08-20 – 2012-08-23 (×3): 237 mL via ORAL
  Filled 2012-08-20 (×4): qty 237

## 2012-08-20 MED ORDER — ACETAMINOPHEN 325 MG PO TABS: 650.0000 mg | ORAL_TABLET | ORAL | Status: DC | PRN

## 2012-08-20 MED ORDER — GLUCERNA SHAKE PO LIQD: 237.0000 mL | Freq: Three times a day (TID) | ORAL | Status: DC

## 2012-08-20 MED ORDER — LORAZEPAM 0.5 MG PO TABS
0.5000 mg | ORAL_TABLET | Freq: Every evening | ORAL | Status: DC | PRN
  Administered 2012-08-21 – 2012-08-22 (×2): 0.5 mg via ORAL
  Filled 2012-08-20 (×2): qty 1

## 2012-08-20 MED ORDER — INSULIN ASPART 100 UNIT/ML ~~LOC~~ SOLN
0.0000 [IU] | Freq: Three times a day (TID) | SUBCUTANEOUS | Status: DC
Start: 2012-08-20 — End: 2012-08-20

## 2012-08-20 MED ORDER — OMEGA-3-ACID ETHYL ESTERS 1 G PO CAPS
1.0000 g | ORAL_CAPSULE | Freq: Every day | ORAL | Status: DC
  Administered 2012-08-20 – 2012-08-23 (×4): 1 g via ORAL
  Filled 2012-08-20 (×4): qty 1

## 2012-08-20 MED ORDER — SODIUM CHLORIDE 0.9 % IJ SOLN: 3.0000 mL | INTRAMUSCULAR | Status: DC | PRN

## 2012-08-20 MED ORDER — SODIUM CHLORIDE 0.9 % IV SOLN: 250.0000 mL | INTRAVENOUS | Status: DC | PRN

## 2012-08-20 MED ORDER — ASPIRIN 81 MG PO CHEW
324.0000 mg | CHEWABLE_TABLET | Freq: Once | ORAL | Status: AC
  Administered 2012-08-20: 324 mg via ORAL
  Filled 2012-08-20: qty 4

## 2012-08-20 MED ORDER — ONDANSETRON HCL 4 MG/2ML IJ SOLN: 4.0000 mg | Freq: Four times a day (QID) | INTRAMUSCULAR | Status: DC | PRN

## 2012-08-20 MED ORDER — EZETIMIBE 10 MG PO TABS
10.0000 mg | ORAL_TABLET | Freq: Every day | ORAL | Status: DC
  Administered 2012-08-20 – 2012-08-22 (×3): 10 mg via ORAL
  Filled 2012-08-20 (×4): qty 1

## 2012-08-20 MED ORDER — GEMFIBROZIL 600 MG PO TABS
600.0000 mg | ORAL_TABLET | Freq: Two times a day (BID) | ORAL | Status: DC
  Administered 2012-08-20 – 2012-08-23 (×6): 600 mg via ORAL
  Filled 2012-08-20 (×8): qty 1

## 2012-08-20 MED ORDER — ASPIRIN EC 81 MG PO TBEC
81.0000 mg | DELAYED_RELEASE_TABLET | Freq: Every day | ORAL | Status: DC
  Administered 2012-08-20 – 2012-08-23 (×4): 81 mg via ORAL
  Filled 2012-08-20 (×4): qty 1

## 2012-08-20 MED ORDER — ADULT MULTIVITAMIN W/MINERALS CH
1.0000 | ORAL_TABLET | Freq: Every day | ORAL | Status: DC
  Administered 2012-08-20 – 2012-08-23 (×4): 1 via ORAL
  Filled 2012-08-20 (×4): qty 1

## 2012-08-20 MED ORDER — WARFARIN - PHARMACIST DOSING INPATIENT: Freq: Every day | Status: DC

## 2012-08-20 MED ORDER — WARFARIN SODIUM 5 MG PO TABS
5.0000 mg | ORAL_TABLET | Freq: Once | ORAL | Status: AC
  Administered 2012-08-20: 5 mg via ORAL
  Filled 2012-08-20 (×3): qty 1

## 2012-08-20 MED ORDER — FINASTERIDE 5 MG PO TABS
5.0000 mg | ORAL_TABLET | Freq: Every day | ORAL | Status: DC
  Administered 2012-08-20 – 2012-08-22 (×3): 5 mg via ORAL
  Filled 2012-08-20 (×3): qty 1

## 2012-08-20 NOTE — ED Notes (Signed)
Pt transferred to 1412 via stretcher accompanied by this nurse on cardiac monitor with chart and personal belongings, condition stable at time of transfer.

## 2012-08-20 NOTE — H&P (Signed)
[facility] History and Physical  GREG CRATTY YQM:[id number] DOB: [birth year] DOA: 08/20/2012   PCP: [name], MD   Chief Complaint: Shortness of breath, dyspnea on exertion  HPI:  79 year old male with a history of diastolic CHF, coronary artery disease status post CABG (1983+ 1993), hypertension, diabetes mellitus type 2, and atrial fibrillation presents with 7-10 day history of progressive dyspnea on exertion. The patient states that he can barely walk to his kitchen without shortness of breath which is new. In fact, his primary care physician told him to increase his furosemide from 40 mg daily  to 60 mg every other day. This did not seem to help his dyspnea. He also complaints of worsening lower extremity edema as well as PND a few times the past one to 2 weeks. He denies any fevers, chills, dizziness, syncope, coughing, hemoptysis nausea, vomiting, diarrhea, abdominal pain, sure, hematuria, rashes. He does complain of some right shoulder pain that has been shooting down his arms for the past one to 2 weeks which only occurs with exertion. In the emergency department, the patient was found to have atrial fibrillation, heart rate 85, proBNP 761, INR 2.24, platelets 91,000, BUN 21 creatinine 0.93. Chest x-ray showed pulmonary vascular congestion. Assessment/Plan: Acute on chronic diastolic CHF -Although CXR was interpreted as "no acute changes", the patient indeed has increasing pulmonary vascular congestion and increased interstitial markings when compared to chest x-ray on 06/25/2011 -Furosemide 40 mg IV every 12 hours -Daily weights -Strict I.'s and O.'s -Echocardiogram 05/10/2011 EF 55-60% with mild to severe aortic stenosis -Check TSH Right shoulder pain/right chest discomfort in a patient with coronary artery disease -Only occurs with exertion -Cycle troponins Diabetes mellitus type 2 -Check hemoglobin A1c -Discontinue Diabeta -Place patient on NovoLog sliding scale,  however I question the need for continued CBG checks given this patient's age and current recommendations--> followup hemoglobin A1c before making final decision Atrial fibrillation -Rate controlled without any chronotropic meds -Continue warfarin -INR 2.24 at time of admission Hyperlipidemia -Continue Zetia CODE STATUS -DO NOT RESUSCITATE       Past Medical History  Diagnosis Date  . Myocardial infarct   . Diabetes mellitus   . Hypertension   . CHF (congestive heart failure)   . Coronary artery disease   . Dysrhythmia   . Clostridium difficile diarrhea     recurrent   . Cancer     Bladder cancer  . Hyperlipidemia   . Pneumonia 04/2011  . Paroxysmal atrial fibrillation    Past Surgical History  Procedure Laterality Date  . Coronary artery bypass graft    . Cholecystectomy    . Cardiac catheterization  10/10/2001  . Cystoscopy, turbt  05/09/2006, 02/19/2006, 04/19/2005, 04/21/2003  . Colonoscopy  01/31/2012    Procedure: COLONOSCOPY;  Surgeon: Iva Boop, MD;  Location: [facility] ENDOSCOPY;  Service: Endoscopy;  Laterality: N/A;  fecal transplant/cynthia snyder will prepare the sample   Social History:  reports that he quit smoking about 38 years ago. His smoking use included Cigarettes. He smoked 1.00 pack per day. He has never used smokeless tobacco. He reports that he does not drink alcohol or use illicit drugs.   Family History  Problem Relation Age of Onset  . Stomach cancer Mother   . Coronary artery disease Father   . Diabetes Son   . Colon cancer Neg Hx      Allergies  Allergen Reactions  . Morphine And Related Other (See Comments)    Reaction unknown  Prior to Admission medications   Medication Sig Start Date End Date Taking? Authorizing Amarie Viles  aspirin EC 81 MG tablet Take 81 mg by mouth daily.   Yes Historical Davonda Ausley, MD  ezetimibe (ZETIA) 10 MG tablet Take 10 mg by mouth at bedtime.    Yes Historical Chaun Uemura, MD  feeding supplement (GLUCERNA  SHAKE) LIQD Take 237 mLs by mouth daily. 08/21/11  Yes Srikar Cherlynn Kaiser, MD  finasteride (PROSCAR) 5 MG tablet Take 5 mg by mouth every morning.    Yes Historical Brandy Zuba, MD  fish oil-omega-3 fatty acids 1000 MG capsule Take 1 g by mouth every morning.    Yes Historical Jamire Shabazz, MD  furosemide (LASIX) 40 MG tablet Take 60 mg by mouth every Monday, Wednesday, and Friday.   Yes Historical Richa Shor, MD  gemfibrozil (LOPID) 600 MG tablet Take 600 mg by mouth 2 (two) times daily before a meal.    Yes Historical Davey Bergsma, MD  glyBURIDE (DIABETA) 5 MG tablet Take 2.5-5 mg by mouth daily as needed.    Yes Antonieta Pert, MD  ibuprofen (ADVIL,MOTRIN) 200 MG tablet Take 400 mg by mouth every 8 (eight) hours as needed for pain.   Yes Historical Nareg Breighner, MD  LORazepam (ATIVAN) 0.5 MG tablet Take 0.5 mg by mouth at bedtime as needed. For insomnia.   Yes Historical Kendel Pesnell, MD  Multiple Vitamin (MULTIVITAMIN WITH MINERALS) TABS Take 1 tablet by mouth daily.   Yes Historical Reneshia Zuccaro, MD  Multiple Vitamins-Minerals (ICAPS PO) Take 1-2 capsules by mouth daily.   Yes Historical Armstrong Creasy, MD  warfarin (COUMADIN) 5 MG tablet Take 5-7.5 mg by mouth daily. 1.5 tabs on Sunday, 1 tab daily all other days.   Yes Historical Riki Gehring, MD    Review of Systems:  Constitutional:  No weight loss, night sweats, Fevers, chills, fatigue.  Head&Eyes: No headache.  No vision loss.  No eye pain or scotoma ENT:  No Difficulty swallowing,Tooth/dental problems,Sore throat,  No ear ache, post nasal drip,  Cardio-vascular:  Complains of right-sided chest discomfort/shortness as well as PND and dyspnea on exertion. GI:  No  abdominal pain, nausea, vomiting, diarrhea, loss of appetite, hematochezia, melena, heartburn, indigestion, Resp:  No shortness of breath with exertion or at rest. No cough. No coughing up of blood .No wheezing.No chest wall deformity  Skin:  no rash or lesions.  GU:  no dysuria, change in color of  urine, no urgency or frequency. No flank pain.  Musculoskeletal:  No joint pain or swelling. No decreased range of motion. No back pain.  Psych:  No change in mood or affect. No depression or anxiety. Neurologic: No headache, no dysesthesia, no focal weakness, no vision loss. No syncope  Physical Exam: Filed Vitals:   08/20/12 0805 08/20/12 0812 08/20/12 1125  BP:  144/76 152/69  Pulse:   80  Temp:   97.8 F (36.6 C)  TempSrc:   Oral  Resp:  13 18  SpO2: 95%  98%   General:  A&O x 3, NAD, nontoxic, pleasant/cooperative Head/Eye: No conjunctival hemorrhage, no icterus, [place]/AT, No nystagmus ENT:  No icterus,  No thrush, good dentition, no pharyngeal exudate Neck:  No masses, no lymphadenpathy, no bruits CV:  RRR, no rub, no gallop, no S3; +JVD Lung:  Bibasilar crackles without any wheezing. Good air movement. Abdomen: soft/NT, +BS, nondistended, no peritoneal signs Ext: No cyanosis, No rashes, No petechiae, No lymphangitis, 2+ edema   Labs on Admission:  Basic Metabolic Panel:  Recent Labs Lab 08/20/12  0955  NA 141  K 4.3  CL 105  CO2 26  GLUCOSE 157*  BUN 21  CREATININE 0.93  CALCIUM 9.0   Liver Function Tests:  Recent Labs Lab 08/20/12 0955  AST 18  ALT 11  ALKPHOS 53  BILITOT 0.6  PROT 7.4  ALBUMIN 3.7   No results found for this basename: LIPASE, AMYLASE,  in the last 168 hours No results found for this basename: AMMONIA,  in the last 168 hours CBC:  Recent Labs Lab 08/20/12 0955  WBC 5.3  NEUTROABS 2.3  HGB 12.4*  HCT 37.2*  MCV 96.9  PLT 91*   Cardiac Enzymes:  Recent Labs Lab 08/20/12 0955  TROPONINI <0.30   BNP: No components found with this basename: POCBNP,  CBG: No results found for this basename: GLUCAP,  in the last 168 hours  Radiological Exams on Admission: Dg Chest 2 View  08/20/2012  *RADIOLOGY REPORT*  Clinical Data: Weakness, shortness of breath  CHEST - 2 VIEW  Comparison: 06/25/2011  Findings: Chronic interstitial  markings.  Mild left basilar scarring/atelectasis.  No superimposed opacities suspicious for pneumonia.  Cardiomegaly.  Postsurgical changes related to prior CABG.  Mild degenerative changes of the visualized thoracolumbar spine.  IMPRESSION: No evidence of acute cardiopulmonary disease.   Original Report Authenticated By: Charline Bills, M.D.     EKG: Independently reviewed. Afib, HR 85, ?RBBB    Time spent:70 minutes Code Status:   DNR Family Communication:   Family at bedside   TAT, DAVID, DO  [facility] Pager [phone]  If 7PM-7AM, please contact night-coverage [email or web address] Password [email or web address] 08/20/2012, 12:12 PM

## 2012-08-20 NOTE — Progress Notes (Signed)
*  PRELIMINARY RESULTS* Echocardiogram 2D Echocardiogram has been performed.  Jeryl Columbia 08/20/2012, 4:26 PM

## 2012-08-20 NOTE — Care Management Note (Addendum)
    Page 1 of 1   08/23/2012     10:52:55 AM   CARE MANAGEMENT NOTE 08/23/2012  Patient:  David Davidson   Account Number:  [id number]  Date Initiated:  08/20/2012  Documentation initiated by:  Lanier Clam  Subjective/Objective Assessment:   ADMITTED W/SOB.HX:CHF.     Action/Plan:   FROM HOME W/KIDS.HAS PCP,PHARMACY.   Anticipated DC Date:  08/23/2012   Anticipated DC Plan:  HOME W HOME HEALTH SERVICES      DC Planning Services  CM consult      Choice offered to / List presented to:  C-1 Patient        HH arranged  HH-2 PT      [facility] agency  [facility]   Status of service:  Completed, signed off Medicare Important Message given?   (If response is "NO", the following Medicare IM given date [place] will be blank) Date Medicare IM given:   Date Additional Medicare IM given:    Discharge Disposition:  HOME W HOME HEALTH SERVICES  Per UR Regulation:  Reviewed for med. necessity/level of care/duration of stay  If discussed at Long Length of Stay Meetings, dates discussed:    Comments:  08/23/12 Larri Yehle RN,BSN NCM [phone] D/C TODAY [facility] DRISTEN(REP) HHPT ORDERED. 08/22/12 Jeneane Pieczynski RN,BSN NCM [phone] [facility] CHOSEN FOR HHPT,KRISTEN(REP) INFORMED.  08/20/12 Arley Garant RN,BSN NCM [phone] RECOMMEND PT/OT CONS WHEN MD FEEL APPROPRIATE.

## 2012-08-20 NOTE — ED Provider Notes (Signed)
History     CSN: [id number]  Arrival date & time 08/20/12  0803   First MD Initiated Contact with Patient 08/20/12 [id number]      Chief Complaint  Patient presents with  . Shortness of Breath    (Consider location/radiation/quality/duration/timing/severity/associated sxs/prior treatment) HPI Comments: Patient brought to the ER by family member for evaluation of difficulty breathing. Patient reports that he has had progressively worsening difficulty breathing over 7-10 days. This morning he woke up gasping for breath. He reports that he can only walk about 10 feet now without having to sit down because of shortness of breath. He does not have any chest pain associated with exertion, but he does get pain behind his right shoulder blade. This pain also resolves after he sits down and rests. There has not been fever or cough. Patient reports that his legs are swollen, but they seem to always be swollen.  Patient is a 79 y.o. male presenting with shortness of breath.  Shortness of Breath Associated symptoms: no cough and no fever     Past Medical History  Diagnosis Date  . Myocardial infarct   . Diabetes mellitus   . Hypertension   . CHF (congestive heart failure)   . Coronary artery disease   . Dysrhythmia   . Clostridium difficile diarrhea     recurrent   . Cancer     Bladder cancer  . Hyperlipidemia   . Pneumonia 04/2011  . Paroxysmal atrial fibrillation     Past Surgical History  Procedure Laterality Date  . Coronary artery bypass graft    . Cholecystectomy    . Cardiac catheterization  10/10/2001  . Cystoscopy, turbt  05/09/2006, 02/19/2006, 04/19/2005, 04/21/2003  . Colonoscopy  01/31/2012    Procedure: COLONOSCOPY;  Surgeon: Iva Boop, MD;  Location: [facility];  Service: Endoscopy;  Laterality: N/A;  fecal transplant/cynthia snyder will prepare the sample    Family History  Problem Relation Age of Onset  . Stomach cancer Mother   . Coronary artery disease Father    . Diabetes Son   . Colon cancer Neg Hx     History  Substance Use Topics  . Smoking status: Former Smoker -- 1.00 packs/day    Types: Cigarettes    Quit date: 05/08/1974  . Smokeless tobacco: Never Used  . Alcohol Use: No     Comment: occasionally      Review of Systems  Constitutional: Positive for fatigue. Negative for fever.  Respiratory: Positive for shortness of breath. Negative for cough.   Neurological: Positive for weakness.  All other systems reviewed and are negative.    Allergies  Morphine and related  Home Medications   Current Outpatient Rx  Name  Route  Sig  Dispense  Refill  . acetaminophen (TYLENOL) 500 MG tablet   Oral   Take 1,000 mg by mouth at bedtime as needed. For pain          . aspirin 81 MG tablet   Oral   Take 81 mg by mouth every morning.          . cholecalciferol (VITAMIN D) 1000 UNITS tablet   Oral   Take 2,000 Units by mouth every morning.          . ezetimibe (ZETIA) 10 MG tablet   Oral   Take 10 mg by mouth at bedtime.          . feeding supplement (GLUCERNA SHAKE) LIQD   Oral   Take 237  mLs by mouth 3 (three) times daily with meals.         . finasteride (PROSCAR) 5 MG tablet   Oral   Take 5 mg by mouth every morning.          . fish oil-omega-3 fatty acids 1000 MG capsule   Oral   Take 1 g by mouth every morning.          Marland Kitchen gemfibrozil (LOPID) 600 MG tablet   Oral   Take 600 mg by mouth 2 (two) times daily before a meal.           . glyBURIDE (DIABETA) 5 MG tablet   Oral   Take 5 mg by mouth as needed.          . hydrocortisone cream 1 %   Topical   Apply 1 application topically 2 (two) times daily. itchy back rash          . levothyroxine (SYNTHROID, LEVOTHROID) 25 MCG tablet   Oral   Take 25 mcg by mouth daily.          Marland Kitchen LORazepam (ATIVAN) 0.5 MG tablet   Oral   Take 0.5 mg by mouth at bedtime as needed. For insomnia.         Marland Kitchen MOVIPREP 100 G SOLR      Use per prep  instructions   1 kit   0     Dispense as written.   . Multiple Vitamin (MULTIVITAMIN) tablet   Oral   Take 1 tablet by mouth daily.         Marland Kitchen nystatin-triamcinolone ointment (MYCOLOG)   Topical   Apply topically 2 (two) times daily. Thin smear to buttocks and perianal area   60 g   1   . warfarin (COUMADIN) 5 MG tablet   Oral   Take 1 tablet (5 mg total) by mouth every evening.   30 tablet   0     SpO2 95%  Physical Exam  Constitutional: He is oriented to person, place, and time. He appears well-developed and well-nourished. No distress.  HENT:  Head: Normocephalic and atraumatic.  Right Ear: Hearing normal.  Nose: Nose normal.  Mouth/Throat: Oropharynx is clear and moist and mucous membranes are normal.  Eyes: Conjunctivae and EOM are normal. Pupils are equal, round, and reactive to light.  Neck: Normal range of motion. Neck supple.  Cardiovascular: Normal rate, regular rhythm, S1 normal and S2 normal.  Exam reveals no gallop and no friction rub.   Murmur heard.  Systolic murmur is present with a grade of 4/6  Pulmonary/Chest: Effort normal and breath sounds normal. No respiratory distress. He exhibits no tenderness.  Abdominal: Soft. Normal appearance and bowel sounds are normal. There is no hepatosplenomegaly. There is no tenderness. There is no rebound, no guarding, no tenderness at McBurney's point and negative Murphy's sign. No hernia.  Musculoskeletal: Normal range of motion.       Right foot: He exhibits swelling.       Left foot: He exhibits swelling.  Neurological: He is alert and oriented to person, place, and time. He has normal strength. No cranial nerve deficit or sensory deficit. Coordination normal. GCS eye subscore is 4. GCS verbal subscore is 5. GCS motor subscore is 6.  Skin: Skin is warm, dry and intact. No rash noted. No cyanosis.  Psychiatric: He has a normal mood and affect. His speech is normal and behavior is normal. Thought content normal.  ED Course  Procedures (including critical care time)   Date: 08/20/2012  Rate: 85  Rhythm: atrial fibrillation  QRS Axis: normal  Intervals: no p waves  ST/T Wave abnormalities: nonspecific ST/T changes  Conduction Disutrbances:right bundle branch block  Narrative Interpretation:   Old EKG Reviewed: unchanged    Labs Reviewed  CBC WITH DIFFERENTIAL - Abnormal; Notable for the following:    RBC 3.84 (*)    Hemoglobin 12.4 (*)    HCT 37.2 (*)    Platelets 91 (*)    Monocytes Relative 16 (*)    All other components within normal limits  COMPREHENSIVE METABOLIC PANEL - Abnormal; Notable for the following:    Glucose, Bld 157 (*)    GFR calc non Af Amer 67 (*)    GFR calc Af Amer 78 (*)    All other components within normal limits  PRO B NATRIURETIC PEPTIDE - Abnormal; Notable for the following:    Pro B Natriuretic peptide (BNP) 761.3 (*)    All other components within normal limits  TROPONIN I  PROTIME-INR   Dg Chest 2 View  08/20/2012  *RADIOLOGY REPORT*  Clinical Data: Weakness, shortness of breath  CHEST - 2 VIEW  Comparison: 06/25/2011  Findings: Chronic interstitial markings.  Mild left basilar scarring/atelectasis.  No superimposed opacities suspicious for pneumonia.  Cardiomegaly.  Postsurgical changes related to prior CABG.  Mild degenerative changes of the visualized thoracolumbar spine.  IMPRESSION: No evidence of acute cardiopulmonary disease.   Original Report Authenticated By: Charline Bills, M.D.      Diagnosis: Unstable Angina    MDM  Patient presents to the ER for evaluation of progressively worsening shortness of breath. Patient has been experiencing significant exercise intolerance with dyspnea on exertion. He now reports that he can only walk about 10 feet without having to sit down and rest. He has developed pain in the right shoulder blade with ambulation that also resolves with rest. Patient has a previous history of coronary artery bypass (1983  and 1993). Symptoms are concerning for unstable angina.   Patient does have a previous history of congestive heart failure. He has had dyspnea on exertion as well as what sounds like paroxysmal nocturnal dyspnea. His chest x-ray does not show any significant pulmonary edema. BNP is at his normal baseline. Patient does have significant systolic ejection murmur. Echo in 2012 showed moderate to severe aortic stenosis.  Patient will require hospitalization for further medical management of unstable angina in the setting of aortic stenosis.        Gilda Crease, MD 08/20/12 1104

## 2012-08-20 NOTE — ED Notes (Signed)
Per EMS: Pt c/o SOB x 1 wk only w/ exertion.  Denies dizziness/CP.  Hx of 2 bypass surgeries, cdiff, and pneumonia.  Pt does not appear to be in any distress at the moment.  02 95% RA

## 2012-08-20 NOTE — Progress Notes (Signed)
ANTICOAGULATION CONSULT NOTE - Initial Consult  Pharmacy Consult for Warfarin Indication: AFib  Allergies  Allergen Reactions  . Morphine And Related Other (See Comments)    Reaction unknown    Patient Measurements: Height: 6\' 1"  (185.4 cm) Weight: 207 lb 3.7 oz (94 kg) IBW/kg (Calculated) : 79.9  Vital Signs: Temp: 97.4 F (36.3 C) (03/11 1300) Temp src: Oral (03/11 1300) BP: 137/87 mmHg (03/11 1300) Pulse Rate: 99 (03/11 1300)  Labs:  Recent Labs  08/20/12 0955  HGB 12.4*  HCT 37.2*  PLT 91*  LABPROT 23.8*  INR 2.24*  CREATININE 0.93  TROPONINI <0.30    Estimated Creatinine Clearance: 48.9 ml/min (by C-G formula based on Cr of 0.93).   Medical History: Past Medical History  Diagnosis Date  . Myocardial infarct   . Diabetes mellitus   . Hypertension   . CHF (congestive heart failure)   . Coronary artery disease   . Dysrhythmia   . Clostridium difficile diarrhea     recurrent   . Cancer     Bladder cancer  . Hyperlipidemia   . Pneumonia 04/2011  . Paroxysmal atrial fibrillation     Medications:  Scheduled:  . [COMPLETED] aspirin  324 mg Oral Once  . aspirin EC  81 mg Oral Daily  . ezetimibe  10 mg Oral QHS  . feeding supplement  237 mL Oral Daily  . finasteride  5 mg Oral Daily  . furosemide  40 mg Intravenous BID  . gemfibrozil  600 mg Oral BID AC  . insulin aspart  0-9 Units Subcutaneous TID WC  . multivitamin with minerals  1 tablet Oral Daily  . omega-3 acid ethyl esters  1 g Oral Daily  . sodium chloride  3 mL Intravenous Q12H  . [DISCONTINUED] feeding supplement  237 mL Oral TID WC  . [DISCONTINUED] fish oil-omega-3 fatty acids  1 g Oral BH-q7a  . [DISCONTINUED] insulin aspart  0-9 Units Subcutaneous TID WC   Infusions:    Assessment: 79 yo M admitted 08/20/12 with acute on chronic diastolic CHF. On chronic warfarin for Afib, home dose reported as 7.5mg  on Sunday and 5mg  on all other days. Last dose taken 3/10. Admit INR is  therapeutic (2.24). Will continue to follow usual home regimen.  Goal of Therapy:  INR 2-3 Monitor platelets by anticoagulation protocol: Yes   Plan:  1) Warfarin 5mg  PO x1 at 18:00 2) Daily INR  Darrol Angel, PharmD Pager: [phone] 08/20/2012,1:49 PM

## 2012-08-21 DIAGNOSIS — D649 Anemia, unspecified: Secondary | ICD-10-CM

## 2012-08-21 LAB — BASIC METABOLIC PANEL
BUN: 20 mg/dL (ref 6–23)
Chloride: 101 mEq/L (ref 96–112)
GFR calc Af Amer: 66 mL/min — ABNORMAL LOW (ref >90)
Potassium: 3.8 mEq/L (ref 3.5–5.1)

## 2012-08-21 LAB — GLUCOSE, CAPILLARY: Glucose-Capillary: 201 mg/dL — ABNORMAL HIGH (ref 70–99)

## 2012-08-21 MED ORDER — WARFARIN SODIUM 5 MG PO TABS
5.0000 mg | ORAL_TABLET | Freq: Once | ORAL | Status: AC
  Administered 2012-08-21: 5 mg via ORAL
  Filled 2012-08-21: qty 1

## 2012-08-21 NOTE — Evaluation (Signed)
Physical Therapy Evaluation Patient Details Name: David Davidson MRN: [id number] DOB: 01/30/1911 Today's Date: 08/21/2012 Time: 4098-1191 PT Time Calculation (min): 11 min  PT Assessment / Plan / Recommendation Clinical Impression  Pt admitted with CHF.  Pt would benefit from acute PT services in order to improve independence with transfers and ambulation by increasing activity tolerance.  Pt declined using RW but states one available at home and his son lives right next door.  Pt SaO2 93% on room air, 91% during ambulation, however dropped to 86% upon sitting back down on bed after ambulation so 1.5 L O2 [place] reapplied and SaO2 quickly increased to 95%.    PT Assessment  Patient needs continued PT services    Follow Up Recommendations  Home health PT;Supervision for mobility/OOB    Does the patient have the potential to tolerate intense rehabilitation      Barriers to Discharge        Equipment Recommendations  None recommended by PT    Recommendations for Other Services     Frequency Min 3X/week    Precautions / Restrictions Precautions Precautions: Fall Precaution Comments: monitor sats   Pertinent Vitals/Pain See below      Mobility  Bed Mobility Bed Mobility: Supine to Sit Supine to Sit: 5: Supervision;HOB elevated Details for Bed Mobility Assistance: pt elevated HOB high Transfers Transfers: Sit to Stand;Stand to Sit Sit to Stand: 4: Min guard;With upper extremity assist;From bed Stand to Sit: 4: Min guard;With upper extremity assist;To bed Ambulation/Gait Ambulation/Gait Assistance: 4: Min guard Ambulation Distance (Feet): 40 Feet Assistive device: Other (Comment) (pushing IV pole) Ambulation/Gait Assistance Details: pt with unsteadiness initially however declined using RW, no LOB, SaO2 remained 91% during ambulation Gait velocity: decreased    Exercises     PT Diagnosis: Difficulty walking  PT Problem List: Cardiopulmonary status limiting  activity;Decreased mobility;Decreased activity tolerance;Decreased balance;Decreased knowledge of use of DME PT Treatment Interventions: Gait training;DME instruction;Stair training;Functional mobility training;Therapeutic activities;Therapeutic exercise;Patient/family education   PT Goals Acute Rehab PT Goals PT Goal Formulation: With patient Time For Goal Achievement: 08/28/12 Potential to Achieve Goals: Good Pt will go Sit to Stand: with modified independence PT Goal: Sit to Stand - Progress: Goal set today Pt will go Stand to Sit: with modified independence PT Goal: Stand to Sit - Progress: Goal set today Pt will Ambulate: 51 - 150 feet;with modified independence;with least restrictive assistive device PT Goal: Ambulate - Progress: Goal set today Pt will Perform Home Exercise Program: with supervision, verbal cues required/provided PT Goal: Perform Home Exercise Program - Progress: Goal set today  Visit Information  Last PT Received On: 08/21/12 Assistance Needed: +1    Subjective Data  Subjective: I've been getting up to go to the bathroom. (denies SOB)   Prior Functioning  Home Living Lives With: Alone Type of Home: House Home Access: Stairs to enter Secretary/administrator of Steps: 3 Entrance Stairs-Rails: Right Home Layout: One level Home Adaptive Equipment: Walker - rolling;Straight cane Additional Comments: States son lives next door Prior Function Level of Independence: Independent with assistive device(s) Comments: uses SPC, states no hx of falls Communication Communication: No difficulties    Cognition  Cognition Overall Cognitive Status: Appears within functional limits for tasks assessed/performed Arousal/Alertness: Awake/alert Orientation Level: Appears intact for tasks assessed Behavior During Session: [facility] for tasks performed    Extremity/Trunk Assessment Right Upper Extremity Assessment RUE ROM/Strength/Tone: [facility] for tasks assessed Left Upper  Extremity Assessment LUE ROM/Strength/Tone: [facility] for tasks assessed Right Lower Extremity  Assessment RLE ROM/Strength/Tone: [facility] for tasks assessed Left Lower Extremity Assessment LLE ROM/Strength/Tone: [facility] for tasks assessed   Balance    End of Session PT - End of Session Equipment Utilized During Treatment: Gait belt Activity Tolerance: Patient limited by fatigue Patient left: in bed;with call bell/phone within reach  GP     LEMYRE,KATHrine E 08/21/2012, 1:36 PM Zenovia Jarred, PT, DPT 08/21/2012 Pager: [phone]

## 2012-08-21 NOTE — Progress Notes (Signed)
[facility] PROGRESS NOTE  David Davidson WGN:[id number] DOB: August 22, 1910 DOA: 08/20/2012 PCP: [name], MD  Assessment/Plan: Acute on chronic diastolic CHF  -continue furosemide 40 mg IV every 12 hours  -FU Daily weights ,  - 2.6L negative so far, FU I/Os  -Echocardiogram 05/10/2011 EF 55-60% with mild to severe aortic stenosis, repeat echo completed, results pending -TSH normal at 2.04  Right shoulder pain/right chest discomfort in a patient with coronary artery disease  -Only occurs with exertion  -Cardiac enzymes negative  Diabetes mellitus type 2  - hemoglobin A1c 7.3 -Discontinue Diabeta, SSI  Atrial fibrillation  -Rate controlled without any chronotropic meds  -Continue warfarin   Hyperlipidemia  -Continue Zetia   Ambulate, PT eval  CODE STATUS : DO NOT RESUSCITATE  Family Communication: None at bedside Disposition Plan: home soon   HPI/Subjective: Breathing somewhat better  Objective: Filed Vitals:   08/20/12 2057 08/21/12 0450 08/21/12 0500 08/21/12 0920  BP: 122/62 104/58  102/62  Pulse: 71 87  92  Temp: 98.3 F (36.8 C) 98.2 F (36.8 C)  97.3 F (36.3 C)  TempSrc: Oral Oral  Oral  Resp: 16 18  16   Height:      Weight:   91.8 kg (202 lb 6.1 oz)   SpO2: 96% 96%  95%    Intake/Output Summary (Last 24 hours) at 08/21/12 1519 Last data filed at 08/21/12 1200  Gross per 24 hour  Intake   1317 ml  Output   3540 ml  Net  -2223 ml   Filed Weights   08/20/12 1300 08/21/12 0500  Weight: 94 kg (207 lb 3.7 oz) 91.8 kg (202 lb 6.1 oz)    Exam:   General:  AAOx3  Cardiovascular: S1S2/RRR  Respiratory: fine basilar crackles  Abdomen: soft, NT, BS present  Ext: trace edema, no c/c   Data Reviewed: Basic Metabolic Panel:  Recent Labs Lab 08/20/12 0955 08/21/12 0557  NA 141 139  K 4.3 3.8  CL 105 101  CO2 26 28  GLUCOSE 157* 157*  BUN 21 20  CREATININE 0.93 1.04  CALCIUM 9.0 8.6   Liver Function Tests:  Recent  Labs Lab 08/20/12 0955  AST 18  ALT 11  ALKPHOS 53  BILITOT 0.6  PROT 7.4  ALBUMIN 3.7   No results found for this basename: LIPASE, AMYLASE,  in the last 168 hours No results found for this basename: AMMONIA,  in the last 168 hours CBC:  Recent Labs Lab 08/20/12 0955  WBC 5.3  NEUTROABS 2.3  HGB 12.4*  HCT 37.2*  MCV 96.9  PLT 91*   Cardiac Enzymes:  Recent Labs Lab 08/20/12 0955 08/20/12 1415 08/20/12 1852  TROPONINI <0.30 <0.30 <0.30   BNP (last 3 results)  Recent Labs  08/20/12 0955  PROBNP 761.3*   CBG:  Recent Labs Lab 08/20/12 1412 08/20/12 1647 08/20/12 2059 08/21/12 0736 08/21/12 1141  GLUCAP 133* 163* 171* 141* 201*    Recent Results (from the past 240 hour(s))  MRSA PCR SCREENING     Status: Abnormal   Collection Time    08/20/12  6:10 PM      Result Value Range Status   MRSA by PCR POSITIVE (*) NEGATIVE Final   Comment:            The GeneXpert MRSA Assay (FDA     approved for NASAL specimens     only), is one component of a     comprehensive MRSA colonization  surveillance program. It is not     intended to diagnose MRSA     infection nor to guide or     monitor treatment for     MRSA infections.     RESULT CALLED TO, READ BACK BY AND VERIFIED WITH:     SCOTTON,J. AT 2010 ON [id number] BY LOVE,T.     Studies: Dg Chest 2 View  08/20/2012  *RADIOLOGY REPORT*  Clinical Data: Weakness, shortness of breath  CHEST - 2 VIEW  Comparison: 06/25/2011  Findings: Chronic interstitial markings.  Mild left basilar scarring/atelectasis.  No superimposed opacities suspicious for pneumonia.  Cardiomegaly.  Postsurgical changes related to prior CABG.  Mild degenerative changes of the visualized thoracolumbar spine.  IMPRESSION: No evidence of acute cardiopulmonary disease.   Original Report Authenticated By: Charline Bills, M.D.     Scheduled Meds: . aspirin EC  81 mg Oral Daily  . ezetimibe  10 mg Oral QHS  . feeding supplement  237 mL Oral  Daily  . finasteride  5 mg Oral Daily  . furosemide  40 mg Intravenous BID  . gemfibrozil  600 mg Oral BID AC  . insulin aspart  0-9 Units Subcutaneous TID WC  . multivitamin with minerals  1 tablet Oral Daily  . omega-3 acid ethyl esters  1 g Oral Daily  . sodium chloride  3 mL Intravenous Q12H  . warfarin  5 mg Oral ONCE-1800  . Warfarin - Pharmacist Dosing Inpatient   Does not apply q1800   Continuous Infusions:   Active Problems:   CAD (coronary artery disease)   DM (diabetes mellitus), type 2, uncontrolled with complications   Hypertension   Hyperlipidemia   Hypothyroidism   Acute on chronic diastolic CHF (congestive heart failure)   DM2 (diabetes mellitus, type 2)    Time spent:    [facility]  [facility] Pager [phone]. If 7PM-7AM, please contact night-coverage at [email or web address], password [email or web address] 08/21/2012, 3:19 PM  LOS: 1 day

## 2012-08-21 NOTE — Progress Notes (Signed)
ANTICOAGULATION CONSULT NOTE - Follow Up  Pharmacy Consult for Warfarin Indication: AFib  Allergies  Allergen Reactions  . Morphine And Related Other (See Comments)    Reaction unknown    Patient Measurements: Height: 6\' 1"  (185.4 cm) Weight: 202 lb 6.1 oz (91.8 kg) IBW/kg (Calculated) : 79.9  Vital Signs: Temp: 98.2 F (36.8 C) (03/12 0450) Temp src: Oral (03/12 0450) BP: 104/58 mmHg (03/12 0450) Pulse Rate: 87 (03/12 0450)  Labs:  Recent Labs  08/20/12 0955 08/20/12 1415 08/20/12 1852 08/21/12 0557  HGB 12.4*  --   --   --   HCT 37.2*  --   --   --   PLT 91*  --   --   --   LABPROT 23.8*  --   --  24.8*  INR 2.24*  --   --  2.37*  CREATININE 0.93  --   --  1.04  TROPONINI <0.30 <0.30 <0.30  --     Estimated Creatinine Clearance: 43.7 ml/min (by C-G formula based on Cr of 1.04).   Medications:  Scheduled:  . [COMPLETED] aspirin  324 mg Oral Once  . aspirin EC  81 mg Oral Daily  . ezetimibe  10 mg Oral QHS  . feeding supplement  237 mL Oral Daily  . finasteride  5 mg Oral Daily  . furosemide  40 mg Intravenous BID  . gemfibrozil  600 mg Oral BID AC  . insulin aspart  0-9 Units Subcutaneous TID WC  . multivitamin with minerals  1 tablet Oral Daily  . omega-3 acid ethyl esters  1 g Oral Daily  . sodium chloride  3 mL Intravenous Q12H  . [COMPLETED] warfarin  5 mg Oral ONCE-1800  . Warfarin - Pharmacist Dosing Inpatient   Does not apply q1800  . [DISCONTINUED] feeding supplement  237 mL Oral TID WC  . [DISCONTINUED] fish oil-omega-3 fatty acids  1 g Oral BH-q7a  . [DISCONTINUED] insulin aspart  0-9 Units Subcutaneous TID WC   Infusions:    Assessment: 79 yo M admitted 08/20/12 with acute on chronic diastolic CHF. On chronic warfarin for Afib, home dose reported as 7.5mg  on Sunday and 5mg  on all other days. Last dose taken 3/10 prior to admission. Admission INR was therapeutic (2.24) and continues to remain therapeutic. Will continue to follow usual home  regimen.  Goal of Therapy:  INR 2-3 Monitor platelets by anticoagulation protocol: Yes   Plan:  1) Warfarin 5mg  PO x1 at 18:00 2) Daily INR  Darrol Angel, PharmD Pager: [phone] 08/21/2012,9:29 AM

## 2012-08-22 DIAGNOSIS — I10 Essential (primary) hypertension: Secondary | ICD-10-CM

## 2012-08-22 LAB — GLUCOSE, CAPILLARY
Glucose-Capillary: 230 mg/dL — ABNORMAL HIGH (ref 70–99)
Glucose-Capillary: 173 mg/dL — ABNORMAL HIGH (ref 70–99)
Glucose-Capillary: 193 mg/dL — ABNORMAL HIGH (ref 70–99)

## 2012-08-22 LAB — BASIC METABOLIC PANEL
GFR calc Af Amer: 55 mL/min — ABNORMAL LOW (ref >90)
GFR calc non Af Amer: 48 mL/min — ABNORMAL LOW (ref >90)
Potassium: 3.7 mEq/L (ref 3.5–5.1)
Sodium: 139 mEq/L (ref 135–145)

## 2012-08-22 LAB — CBC
Platelets: 99 10*3/uL — ABNORMAL LOW (ref 150–400)
RDW: 13.5 % (ref 11.5–15.5)
WBC: 7.2 10*3/uL (ref 4.0–10.5)

## 2012-08-22 LAB — PROTIME-INR
INR: 2.24 — ABNORMAL HIGH (ref 0.00–1.49)
Prothrombin Time: 23.8 seconds — ABNORMAL HIGH (ref 11.6–15.2)

## 2012-08-22 MED ORDER — FUROSEMIDE 40 MG PO TABS
40.0000 mg | ORAL_TABLET | Freq: Every day | ORAL | Status: DC
  Administered 2012-08-22 – 2012-08-23 (×2): 40 mg via ORAL
  Filled 2012-08-22 (×2): qty 1

## 2012-08-22 MED ORDER — WARFARIN SODIUM 5 MG PO TABS
5.0000 mg | ORAL_TABLET | Freq: Once | ORAL | Status: AC
  Administered 2012-08-22: 5 mg via ORAL
  Filled 2012-08-22: qty 1

## 2012-08-22 MED ORDER — CHLORHEXIDINE GLUCONATE CLOTH 2 % EX PADS
6.0000 | MEDICATED_PAD | Freq: Every day | CUTANEOUS | Status: DC
  Administered 2012-08-23: 6 via TOPICAL

## 2012-08-22 MED ORDER — MUPIROCIN 2 % EX OINT
1.0000 "application " | TOPICAL_OINTMENT | Freq: Two times a day (BID) | CUTANEOUS | Status: DC
  Administered 2012-08-22 – 2012-08-23 (×2): 1 via NASAL
  Filled 2012-08-22: qty 22

## 2012-08-22 NOTE — Progress Notes (Signed)
[facility] PROGRESS NOTE  David Davidson AVW:[id number] DOB: 10-Mar-1911 DOA: 08/20/2012 PCP: [name], MD  Assessment/Plan: Acute on chronic diastolic and systolic CHF -continue furosemide, change to PO  -FU Daily weights ,  - 3.1L negative so far, FU I/Os  -Echocardiogram 05/10/2011 EF 55-60%, repeat echo with EF of 45% , grade 3 diastolic dysfunction and some wall motion abnormality, due to advanced age not a candidate for aggressive intervention/cath, medical management, pt agreeable to this. -hold off on Ace inhibitor due to soft/low BP today -TSH normal at 2.04  Right shoulder pain/right chest discomfort in a patient with coronary artery disease  -Only occurs with exertion  -Cardiac enzymes negative  Diabetes mellitus type 2  - hemoglobin A1c 7.3 -Discontinue Diabeta, SSI  Atrial fibrillation  -Rate controlled without any chronotropic meds  -Continue warfarin   Hyperlipidemia  -Continue Zetia   Ambulate, PT eval  CODE STATUS : DO NOT RESUSCITATE  Family Communication: None at bedside Disposition Plan: home tomorrow   HPI/Subjective: Breathing somewhat better  Objective: Filed Vitals:   08/21/12 2102 08/22/12 0500 08/22/12 0518 08/22/12 0900  BP: 104/57  110/69 92/56  Pulse: 84  96 85  Temp: 98.5 F (36.9 C)  97.8 F (36.6 C) 97.7 F (36.5 C)  TempSrc: Oral  Oral Oral  Resp: 16  14 15   Height:      Weight:  90.2 kg (198 lb 13.7 oz)    SpO2: 97%  97% 94%    Intake/Output Summary (Last 24 hours) at 08/22/12 1125 Last data filed at 08/22/12 1000  Gross per 24 hour  Intake    720 ml  Output   1575 ml  Net   -855 ml   Filed Weights   08/20/12 1300 08/21/12 0500 08/22/12 0500  Weight: 94 kg (207 lb 3.7 oz) 91.8 kg (202 lb 6.1 oz) 90.2 kg (198 lb 13.7 oz)    Exam:   General:  AAOx3  Cardiovascular: S1S2/RRR  Respiratory: fine basilar crackles  Abdomen: soft, NT, BS present  Ext: trace edema, no c/c   Data Reviewed: Basic  Metabolic Panel:  Recent Labs Lab 08/20/12 0955 08/21/12 0557 08/22/12 0525  NA 141 139 139  K 4.3 3.8 3.7  CL 105 101 100  CO2 26 28 30   GLUCOSE 157* 157* 174*  BUN 21 20 26*  CREATININE 0.93 1.04 1.21  CALCIUM 9.0 8.6 8.7   Liver Function Tests:  Recent Labs Lab 08/20/12 0955  AST 18  ALT 11  ALKPHOS 53  BILITOT 0.6  PROT 7.4  ALBUMIN 3.7   No results found for this basename: LIPASE, AMYLASE,  in the last 168 hours No results found for this basename: AMMONIA,  in the last 168 hours CBC:  Recent Labs Lab 08/20/12 0955 08/22/12 0525  WBC 5.3 7.2  NEUTROABS 2.3  --   HGB 12.4* 12.1*  HCT 37.2* 37.0*  MCV 96.9 96.1  PLT 91* 99*   Cardiac Enzymes:  Recent Labs Lab 08/20/12 0955 08/20/12 1415 08/20/12 1852  TROPONINI <0.30 <0.30 <0.30   BNP (last 3 results)  Recent Labs  08/20/12 0955  PROBNP 761.3*   CBG:  Recent Labs Lab 08/21/12 0736 08/21/12 1141 08/21/12 1642 08/21/12 2059 08/22/12 0820  GLUCAP 141* 201* 119* 230* 173*    Recent Results (from the past 240 hour(s))  MRSA PCR SCREENING     Status: Abnormal   Collection Time    08/20/12  6:10 PM      Result  Value Range Status   MRSA by PCR POSITIVE (*) NEGATIVE Final   Comment:            The GeneXpert MRSA Assay (FDA     approved for NASAL specimens     only), is one component of a     comprehensive MRSA colonization     surveillance program. It is not     intended to diagnose MRSA     infection nor to guide or     monitor treatment for     MRSA infections.     RESULT CALLED TO, READ BACK BY AND VERIFIED WITH:     SCOTTON,J. AT 2010 ON [id number] BY LOVE,T.     Studies: No results found.  Scheduled Meds: . aspirin EC  81 mg Oral Daily  . ezetimibe  10 mg Oral QHS  . feeding supplement  237 mL Oral Daily  . furosemide  40 mg Oral Daily  . gemfibrozil  600 mg Oral BID AC  . insulin aspart  0-9 Units Subcutaneous TID WC  . multivitamin with minerals  1 tablet Oral Daily  .  omega-3 acid ethyl esters  1 g Oral Daily  . sodium chloride  3 mL Intravenous Q12H  . warfarin  5 mg Oral ONCE-1800  . Warfarin - Pharmacist Dosing Inpatient   Does not apply q1800   Continuous Infusions:   Active Problems:   CAD (coronary artery disease)   DM (diabetes mellitus), type 2, uncontrolled with complications   Hypertension   Hyperlipidemia   Hypothyroidism   Acute on chronic diastolic CHF (congestive heart failure)   DM2 (diabetes mellitus, type 2)    Time spent:    [facility]  [facility] Pager [phone]. If 7PM-7AM, please contact night-coverage at [email or web address], password [facility] 08/22/2012, 11:25 AM  LOS: 2 days

## 2012-08-22 NOTE — Clinical Documentation Improvement (Signed)
Abnormal Labs Clarification  THIS DOCUMENT IS NOT A PERMANENT PART OF THE MEDICAL RECORD  TO RESPOND TO THE THIS QUERY, FOLLOW THE INSTRUCTIONS BELOW:  1. If needed, update documentation for the patient's encounter via the notes activity.  2. Access this query again and click edit on the [employer].  3. After updating, or not, click F2 to complete all highlighted (required) [place] concerning your review. Select "additional documentation in the medical record" OR "no additional documentation provided".  4. Click Sign note button.  5. The deficiency will fall out of your InBasket *Please let us know if you are not able to complete this workflow by phone or e-mail (listed below).  Please update your documentation within the medical record to reflect your response to this query.                                                                                   08/22/12  Dear Dr. Jomarie Longs, P/Associates  In a better effort to capture your patient's severity of illness, reflect appropriate length of stay and utilization of resources, a review of the medical record has revealed the following indicators.    Based on your clinical judgment, please clarify and document in a progress note and/or discharge summary the clinical condition associated with the following supporting information:  In responding to this query please exercise your independent judgment.  The fact that a query is asked, does not imply that any particular answer is desired or expected.  Abnormal findings (laboratory, x-ray, pathologic, and other diagnostic results) are not coded and reported unless the physician indicates their clinical significance.   The medical record reflects the following clinical findings, please clarify the diagnostic and/or clinical significance:       According to EKG on 08/20/12  pt with A Flutter, Ventricular Premature complexes, and R BBB   Clarification Needed   Please clarify if you agree  pt with A Flutter, Ventricular Premature complexes, and R BBB in setting of EF = 45%, and Acute on chronic diastolic CHF and document in pn or d/c summary    Possible Clinical Conditions?                                  ____________________                                          Other Condition___________________                Cannot Clinically Determine_________      Supporting Information: Acute on chronic diastolic CHF, (R) shoulder/chest discomfort, CAD, DM, AFIB, HKD, CAD,  Diagnostics EKG 08/20/12 Aflutter/Ventricular Premature complexes R BBB  EKG 08/21/12 A flutter  2DEcho 08/20/12 EF is in the 45% range.  Treatment:  Lasix Monitoring Reviewed:  no additional documentation provided ljh   Thank You,  Enis Slipper  RN, BSN, MSN/Inf, CCDS Clinical Documentation Specialist [facility] Pager: [phone] / E-mail: [email or web address]  Health Information Management [place]

## 2012-08-22 NOTE — Progress Notes (Signed)
ANTICOAGULATION CONSULT NOTE - Follow Up  Pharmacy Consult for Warfarin Indication: AFib  Allergies  Allergen Reactions  . Morphine And Related Other (See Comments)    Reaction unknown    Patient Measurements: Height: 6\' 1"  (185.4 cm) Weight: 198 lb 13.7 oz (90.2 kg) IBW/kg (Calculated) : 79.9  Vital Signs: Temp: 97.8 F (36.6 C) (03/13 0518) Temp src: Oral (03/13 0518) BP: 110/69 mmHg (03/13 0518) Pulse Rate: 96 (03/13 0518)  Labs:  Recent Labs  08/20/12 0955 08/20/12 1415 08/20/12 1852 08/21/12 0557 08/22/12 0525  HGB 12.4*  --   --   --  12.1*  HCT 37.2*  --   --   --  37.0*  PLT 91*  --   --   --  99*  LABPROT 23.8*  --   --  24.8* 23.8*  INR 2.24*  --   --  2.37* 2.24*  CREATININE 0.93  --   --  1.04 1.21  TROPONINI <0.30 <0.30 <0.30  --   --     Estimated Creatinine Clearance: 37.6 ml/min (by C-G formula based on Cr of 1.21).   Medications:  Scheduled:  . aspirin EC  81 mg Oral Daily  . ezetimibe  10 mg Oral QHS  . feeding supplement  237 mL Oral Daily  . finasteride  5 mg Oral Daily  . furosemide  40 mg Intravenous BID  . gemfibrozil  600 mg Oral BID AC  . insulin aspart  0-9 Units Subcutaneous TID WC  . multivitamin with minerals  1 tablet Oral Daily  . omega-3 acid ethyl esters  1 g Oral Daily  . sodium chloride  3 mL Intravenous Q12H  . [COMPLETED] warfarin  5 mg Oral ONCE-1800  . Warfarin - Pharmacist Dosing Inpatient   Does not apply q1800   Infusions:    Assessment:  79 yo M admitted 08/20/12 with acute on chronic diastolic CHF.   On chronic warfarin for Afib, home dose reported as 7.5mg  on Sunday and 5mg  on all other days. Last dose taken 3/10 prior to admission.   Admission INR was therapeutic (2.24) and continues to remain therapeutic.   CBC stable, no bleeding reported  Goal of Therapy:  INR 2-3 Monitor platelets by anticoagulation protocol: Yes   Plan:  1) As per home regimen, warfarin 5mg  PO x1 at 18:00 2) Daily  INR  Hessie Knows, PharmD, BCPS Pager [phone] 08/22/2012 8:43 AM

## 2012-08-23 DIAGNOSIS — I5021 Acute systolic (congestive) heart failure: Secondary | ICD-10-CM | POA: Diagnosis present

## 2012-08-23 LAB — GLUCOSE, CAPILLARY
Glucose-Capillary: 105 mg/dL — ABNORMAL HIGH (ref 70–99)
Glucose-Capillary: 155 mg/dL — ABNORMAL HIGH (ref 70–99)

## 2012-08-23 LAB — BASIC METABOLIC PANEL
BUN: 31 mg/dL — ABNORMAL HIGH (ref 6–23)
CO2: 28 mEq/L (ref 19–32)
Calcium: 8.7 mg/dL (ref 8.4–10.5)
Chloride: 101 mEq/L (ref 96–112)
Creatinine, Ser: 1.22 mg/dL (ref 0.50–1.35)
GFR calc Af Amer: 55 mL/min — ABNORMAL LOW (ref >90)

## 2012-08-23 MED ORDER — METOPROLOL TARTRATE 12.5 MG HALF TABLET
12.5000 mg | ORAL_TABLET | Freq: Two times a day (BID) | ORAL | Status: DC
  Administered 2012-08-23: 12.5 mg via ORAL
  Filled 2012-08-23 (×2): qty 1

## 2012-08-23 MED ORDER — FUROSEMIDE 40 MG PO TABS: 40.0000 mg | ORAL_TABLET | Freq: Every day | ORAL | Status: DC

## 2012-08-23 MED ORDER — WARFARIN SODIUM 5 MG PO TABS
5.0000 mg | ORAL_TABLET | Freq: Once | ORAL | Status: DC
  Filled 2012-08-23: qty 1

## 2012-08-23 MED ORDER — METOPROLOL TARTRATE 12.5 MG HALF TABLET: 12.5000 mg | ORAL_TABLET | Freq: Two times a day (BID) | ORAL | Status: DC

## 2012-08-23 NOTE — Progress Notes (Signed)
ANTICOAGULATION CONSULT NOTE - Follow Up  Pharmacy Consult for Warfarin Indication: AFib  Allergies  Allergen Reactions  . Morphine And Related Other (See Comments)    Reaction unknown    Patient Measurements: Height: 6\' 1"  (185.4 cm) Weight: 195 lb 12.3 oz (88.8 kg) IBW/kg (Calculated) : 79.9  Vital Signs: Temp: 97.5 F (36.4 C) (03/14 0515) Temp src: Oral (03/14 0515) BP: 110/67 mmHg (03/14 0515) Pulse Rate: 95 (03/14 0515)  Labs:  Recent Labs  08/20/12 0955 08/20/12 1415 08/20/12 1852 08/21/12 0557 08/22/12 0525 08/23/12 0440  HGB 12.4*  --   --   --  12.1*  --   HCT 37.2*  --   --   --  37.0*  --   PLT 91*  --   --   --  99*  --   LABPROT 23.8*  --   --  24.8* 23.8* 26.1*  INR 2.24*  --   --  2.37* 2.24* 2.54*  CREATININE 0.93  --   --  1.04 1.21 1.22  TROPONINI <0.30 <0.30 <0.30  --   --   --     Estimated Creatinine Clearance: 37.3 ml/min (by C-G formula based on Cr of 1.22).   Medications:  Scheduled:  . aspirin EC  81 mg Oral Daily  . Chlorhexidine Gluconate Cloth  6 each Topical Q0600  . ezetimibe  10 mg Oral QHS  . feeding supplement  237 mL Oral Daily  . furosemide  40 mg Oral Daily  . gemfibrozil  600 mg Oral BID AC  . insulin aspart  0-9 Units Subcutaneous TID WC  . metoprolol tartrate  12.5 mg Oral BID  . multivitamin with minerals  1 tablet Oral Daily  . mupirocin ointment  1 application Nasal BID  . omega-3 acid ethyl esters  1 g Oral Daily  . sodium chloride  3 mL Intravenous Q12H  . [COMPLETED] warfarin  5 mg Oral ONCE-1800  . Warfarin - Pharmacist Dosing Inpatient   Does not apply q1800  . [DISCONTINUED] finasteride  5 mg Oral Daily  . [DISCONTINUED] furosemide  40 mg Intravenous BID   Infusions:    Assessment:  79 yo M admitted 08/20/12 with acute on chronic diastolic CHF.   On chronic warfarin for Afib, home dose reported as 7.5mg  on Sunday and 5mg  on all other days. Last dose taken 3/10 prior to admission.   INR continues to  be therapeutic   CBC stable on 3/13, no bleeding reported  Goal of Therapy:  INR 2-3 Monitor platelets by anticoagulation protocol: Yes   Plan:  1) As per home regimen, repeat warfarin 5mg  PO x1 at 18:00 2) Daily INR  Hessie Knows, PharmD, BCPS Pager [phone] 08/23/2012 8:50 AM

## 2012-08-23 NOTE — Progress Notes (Signed)
Physical Therapy Treatment Patient Details Name: David Davidson MRN: [id number] DOB: 12-01-1910 Today's Date: 08/23/2012 Time: 4696-2952 PT Time Calculation (min): 24 min  PT Assessment / Plan / Recommendation Comments on Treatment Session  Assisted pt OOB to amb to BR then amb in hallway on RA sats avg 91% with only mild SOB.  Pt plans to D/c to home.    Follow Up Recommendations  Home health PT;Supervision for mobility/OOB     Does the patient have the potential to tolerate intense rehabilitation     Barriers to Discharge        Equipment Recommendations  None recommended by PT    Recommendations for Other Services    Frequency Min 3X/week   Plan      Precautions / Restrictions Precautions Precautions: Fall Precaution Comments: monitor sats Restrictions Weight Bearing Restrictions: No   Pertinent Vitals/Pain No c/o pain    Mobility  Bed Mobility Bed Mobility: Supine to Sit Supine to Sit: 5: Supervision;HOB elevated Details for Bed Mobility Assistance: pt elevated HOB high Transfers Transfers: Sit to Stand;Stand to Sit Sit to Stand: 4: Min guard;With upper extremity assist;From bed;5: Supervision;From toilet Stand to Sit: 4: Min guard;With upper extremity assist;To bed;5: Supervision;To toilet Details for Transfer Assistance: One VC safety with turns Ambulation/Gait Ambulation/Gait Assistance: 4: Min guard;5: Supervision Ambulation Distance (Feet): 95 Feet Assistive device: Rolling walker Ambulation/Gait Assistance Details: amb with RW to increase amb distance and monitor RA. Sats avg 90 - 92%. HR avg 98.  Gait Pattern: Step-through pattern Gait velocity: WFL     PT Goals                                                       progressing    Visit Information  Last PT Received On: 08/23/12 Assistance Needed: +1    Subjective Data      Cognition    good   Balance   good  End of Session PT - End of Session Equipment Utilized During Treatment: Gait  belt Activity Tolerance: Patient limited by fatigue Patient left: in chair;with call bell/phone within reach   Felecia Shelling  PTA [facility]  Acute  Rehab Pager      [phone]

## 2012-09-04 NOTE — Discharge Summary (Signed)
Physician Discharge Summary  David Davidson NFA:[id number] DOB: 1910-11-13 DOA: 08/20/2012  PCP: [name], MD  Admit date: 08/20/2012 Discharge date: 08/23/2012  Time spent: 45 minutes  Recommendations for Outpatient Follow-up:  1. PCP in 1 week 2. Cardiologist in 1-2 weeks 3. Bmet in 1 week 4. Start low dose ACE as BP tolerates  Discharge Diagnoses:     Acute on chronic diastolic CHF (congestive heart failure)   Systolic CHF, acute   CAD (coronary artery disease)   DM (diabetes mellitus), type 2, uncontrolled with complications   Hypertension   Hyperlipidemia   Hypothyroidism   DM2 (diabetes mellitus, type 2)      Discharge Condition: stable  Diet recommendation: Low Na  Filed Weights   08/21/12 0500 08/22/12 0500 08/23/12 0515  Weight: 91.8 kg (202 lb 6.1 oz) 90.2 kg (198 lb 13.7 oz) 88.8 kg (195 lb 12.3 oz)    History of present illness:  79 year old male with a history of diastolic CHF, coronary artery disease status post CABG (1983+ 1993), hypertension, diabetes mellitus type 2, and atrial fibrillation presents with 7-10 day history of progressive dyspnea on exertion. The patient states that he can barely walk to his kitchen without shortness of breath which is new. In fact, his primary care physician told him to increase his furosemide from 40 mg daily to 60 mg every other day. This did not seem to help his dyspnea. He also complaints of worsening lower extremity edema as well as PND a few times the past one to 2 weeks. He denies any fevers, chills, dizziness, syncope, coughing, hemoptysis nausea, vomiting, diarrhea, abdominal pain, sure, hematuria, rashes. He does complain of some right shoulder pain that has been shooting down his arms for the past one to 2 weeks which only occurs with exertion.  In the emergency department, the patient was found to have atrial fibrillation, heart rate 85, proBNP 761, INR 2.24, platelets 91,000, BUN 21 creatinine 0.93. Chest x-ray  showed pulmonary vascular congestion.   Hospital Course:  Acute on chronic diastolic and systolic CHF  - diuresed with IV lasix with significant clinical improvement -then transitioned to  PO lasix - 4L negative - cardiac enzymes x3 negative -Echocardiogram 05/10/2011 showed EF 55-60%, repeat echo now  with EF of 45% , grade 3 diastolic dysfunction and some wall motion abnormality, due to advanced age not a candidate for aggressive intervention/cath, medical management only recommended and pt was agreeable to this.  -held off on Ace inhibitor due to soft/low BP -TSH normal at 2.04    Diabetes mellitus type 2  - hemoglobin A1c 7.3   Atrial fibrillation  -Rate controlled without any chronotropic meds  -Continue warfarin, INR therapeutic  Hyperlipidemia  -Continue Zetia    Discharge Exam: Filed Vitals:   08/22/12 0900 08/22/12 1401 08/22/12 2047 08/23/12 0515  BP: 92/56 129/83 119/66 110/67  Pulse: 85 89 93 95  Temp: 97.7 F (36.5 C) 98 F (36.7 C) 98.3 F (36.8 C) 97.5 F (36.4 C)  TempSrc: Oral Oral Oral Oral  Resp: 15 18 18 18   Height:      Weight:    88.8 kg (195 lb 12.3 oz)  SpO2: 94% 93% 93% 92%    General: AAOx3 Cardiovascular: S1S2/RRR Respiratory: CTAB  Discharge Instructions  Discharge Orders   Future Orders Complete By Expires     Diet - low sodium heart healthy  As directed     Increase activity slowly  As directed  Medication List    STOP taking these medications       ibuprofen 200 MG tablet  Commonly known as:  ADVIL,MOTRIN      TAKE these medications       aspirin EC 81 MG tablet  Take 81 mg by mouth daily.     ezetimibe 10 MG tablet  Commonly known as:  ZETIA  Take 10 mg by mouth at bedtime.     feeding supplement Liqd  Take 237 mLs by mouth daily.     finasteride 5 MG tablet  Commonly known as:  PROSCAR  Take 5 mg by mouth every morning.     fish oil-omega-3 fatty acids 1000 MG capsule  Take 1 g by mouth every  morning.     furosemide 40 MG tablet  Commonly known as:  LASIX  Take 1 tablet (40 mg total) by mouth daily.     gemfibrozil 600 MG tablet  Commonly known as:  LOPID  Take 600 mg by mouth 2 (two) times daily before a meal.     glyBURIDE 5 MG tablet  Commonly known as:  DIABETA  Take 2.5-5 mg by mouth daily as needed.     ICAPS PO  Take 1-2 capsules by mouth daily.     LORazepam 0.5 MG tablet  Commonly known as:  ATIVAN  Take 0.5 mg by mouth at bedtime as needed. For insomnia.     metoprolol tartrate 12.5 mg Tabs  Commonly known as:  LOPRESSOR  Take 0.5 tablets (12.5 mg total) by mouth 2 (two) times daily.     multivitamin with minerals Tabs  Take 1 tablet by mouth daily.     warfarin 5 MG tablet  Commonly known as:  COUMADIN  Take 5-7.5 mg by mouth daily. 1.5 tabs on Sunday, 1 tab daily all other days.           Follow-up Information   Follow up with [name], MD In 1 week.   Contact information:   [address] [place] [address] [phone]        The results of significant diagnostics from this hospitalization (including imaging, microbiology, ancillary and laboratory) are listed below for reference.    Significant Diagnostic Studies: Dg Chest 2 View  08/20/2012  *RADIOLOGY REPORT*  Clinical Data: Weakness, shortness of breath  CHEST - 2 VIEW  Comparison: 06/25/2011  Findings: Chronic interstitial markings.  Mild left basilar scarring/atelectasis.  No superimposed opacities suspicious for pneumonia.  Cardiomegaly.  Postsurgical changes related to prior CABG.  Mild degenerative changes of the visualized thoracolumbar spine.  IMPRESSION: No evidence of acute cardiopulmonary disease.   Original Report Authenticated By: Charline Bills, M.D.     Microbiology: No results found for this or any previous visit (from the past 240 hour(s)).   Labs: Basic Metabolic Panel: No results found for this basename: NA, K, CL, CO2, GLUCOSE, BUN,  CREATININE, CALCIUM, MG, PHOS,  in the last 168 hours Liver Function Tests: No results found for this basename: AST, ALT, ALKPHOS, BILITOT, PROT, ALBUMIN,  in the last 168 hours No results found for this basename: LIPASE, AMYLASE,  in the last 168 hours No results found for this basename: AMMONIA,  in the last 168 hours CBC: No results found for this basename: WBC, NEUTROABS, HGB, HCT, MCV, PLT,  in the last 168 hours Cardiac Enzymes: No results found for this basename: CKTOTAL, CKMB, CKMBINDEX, TROPONINI,  in the last 168 hours BNP: BNP (last 3 results)  Recent Labs  08/20/12 0955  PROBNP 761.3*   CBG: No results found for this basename: GLUCAP,  in the last 168 hours     Signed:  JOSEPH,PREETHA  [facility] 09/04/2012, 5:34 PM

## 2012-12-24 ENCOUNTER — Emergency Department (HOSPITAL_COMMUNITY): Payer: Medicare Other

## 2012-12-24 ENCOUNTER — Encounter (HOSPITAL_COMMUNITY): Payer: Self-pay | Admitting: *Deleted

## 2012-12-24 ENCOUNTER — Inpatient Hospital Stay (HOSPITAL_COMMUNITY)
Admission: EM | Admit: 2012-12-24 | Discharge: 2012-12-26 | DRG: 293 | Disposition: A | Discharge status: Home Health | Payer: Medicare Other | Attending: Internal Medicine | Admitting: Internal Medicine

## 2012-12-24 DIAGNOSIS — I2510 Atherosclerotic heart disease of native coronary artery without angina pectoris: Secondary | ICD-10-CM

## 2012-12-24 DIAGNOSIS — IMO0002 Reserved for concepts with insufficient information to code with codable children: Secondary | ICD-10-CM

## 2012-12-24 DIAGNOSIS — I5033 Acute on chronic diastolic (congestive) heart failure: Secondary | ICD-10-CM

## 2012-12-24 DIAGNOSIS — Z951 Presence of aortocoronary bypass graft: Secondary | ICD-10-CM

## 2012-12-24 DIAGNOSIS — E1165 Type 2 diabetes mellitus with hyperglycemia: Secondary | ICD-10-CM

## 2012-12-24 DIAGNOSIS — I509 Heart failure, unspecified: Secondary | ICD-10-CM | POA: Diagnosis present

## 2012-12-24 DIAGNOSIS — I4891 Unspecified atrial fibrillation: Secondary | ICD-10-CM | POA: Diagnosis present

## 2012-12-24 DIAGNOSIS — E785 Hyperlipidemia, unspecified: Secondary | ICD-10-CM

## 2012-12-24 DIAGNOSIS — Z87891 Personal history of nicotine dependence: Secondary | ICD-10-CM

## 2012-12-24 DIAGNOSIS — I4510 Unspecified right bundle-branch block: Secondary | ICD-10-CM | POA: Diagnosis present

## 2012-12-24 DIAGNOSIS — R079 Chest pain, unspecified: Secondary | ICD-10-CM

## 2012-12-24 DIAGNOSIS — E119 Type 2 diabetes mellitus without complications: Secondary | ICD-10-CM

## 2012-12-24 DIAGNOSIS — I5021 Acute systolic (congestive) heart failure: Secondary | ICD-10-CM | POA: Diagnosis present

## 2012-12-24 DIAGNOSIS — Z8551 Personal history of malignant neoplasm of bladder: Secondary | ICD-10-CM

## 2012-12-24 DIAGNOSIS — I252 Old myocardial infarction: Secondary | ICD-10-CM

## 2012-12-24 DIAGNOSIS — I5043 Acute on chronic combined systolic (congestive) and diastolic (congestive) heart failure: Principal | ICD-10-CM

## 2012-12-24 DIAGNOSIS — I480 Paroxysmal atrial fibrillation: Secondary | ICD-10-CM

## 2012-12-24 DIAGNOSIS — I10 Essential (primary) hypertension: Secondary | ICD-10-CM

## 2012-12-24 DIAGNOSIS — Z66 Do not resuscitate: Secondary | ICD-10-CM | POA: Diagnosis present

## 2012-12-24 HISTORY — DX: Shortness of breath: R06.02

## 2012-12-24 LAB — COMPREHENSIVE METABOLIC PANEL
CO2: 27 mEq/L (ref 19–32)
Calcium: 9 mg/dL (ref 8.4–10.5)
Creatinine, Ser: 1.04 mg/dL (ref 0.50–1.35)
GFR calc Af Amer: 66 mL/min — ABNORMAL LOW (ref >90)
GFR calc non Af Amer: 57 mL/min — ABNORMAL LOW (ref >90)
Glucose, Bld: 140 mg/dL — ABNORMAL HIGH (ref 70–99)

## 2012-12-24 LAB — GLUCOSE, CAPILLARY

## 2012-12-24 LAB — CBC WITH DIFFERENTIAL/PLATELET
Basophils Relative: 0 % (ref 0–1)
Hemoglobin: 11.8 g/dL — ABNORMAL LOW (ref 13.0–17.0)
Lymphocytes Relative: 35 % (ref 12–46)
Lymphs Abs: 1.5 10*3/uL (ref 0.7–4.0)
Monocytes Relative: 16 % — ABNORMAL HIGH (ref 3–12)
Neutro Abs: 2 10*3/uL (ref 1.7–7.7)
Neutrophils Relative %: 45 % (ref 43–77)
RBC: 3.58 MIL/uL — ABNORMAL LOW (ref 4.22–5.81)
WBC: 4.4 10*3/uL (ref 4.0–10.5)

## 2012-12-24 LAB — PROTIME-INR: INR: 2.17 — ABNORMAL HIGH (ref 0.00–1.49)

## 2012-12-24 LAB — PRO B NATRIURETIC PEPTIDE: Pro B Natriuretic peptide (BNP): 653.5 pg/mL — ABNORMAL HIGH (ref 0–450)

## 2012-12-24 LAB — TROPONIN I: Troponin I: <0.3 ng/mL (ref <0.30)

## 2012-12-24 LAB — POCT I-STAT TROPONIN I

## 2012-12-24 LAB — TSH: TSH: 2.156 u[IU]/mL (ref 0.350–4.500)

## 2012-12-24 MED ORDER — GEMFIBROZIL 600 MG PO TABS
600.0000 mg | ORAL_TABLET | Freq: Two times a day (BID) | ORAL | Status: DC
  Administered 2012-12-24 – 2012-12-26 (×4): 600 mg via ORAL
  Filled 2012-12-24 (×6): qty 1

## 2012-12-24 MED ORDER — SODIUM CHLORIDE 0.9 % IJ SOLN
3.0000 mL | Freq: Two times a day (BID) | INTRAMUSCULAR | Status: DC
  Administered 2012-12-24 – 2012-12-26 (×5): 3 mL via INTRAVENOUS

## 2012-12-24 MED ORDER — METOPROLOL TARTRATE 12.5 MG HALF TABLET
12.5000 mg | ORAL_TABLET | Freq: Two times a day (BID) | ORAL | Status: DC
  Administered 2012-12-24 – 2012-12-26 (×5): 12.5 mg via ORAL
  Filled 2012-12-24 (×6): qty 1

## 2012-12-24 MED ORDER — ACETAMINOPHEN 325 MG PO TABS: 650.0000 mg | ORAL_TABLET | ORAL | Status: DC | PRN

## 2012-12-24 MED ORDER — ONDANSETRON HCL 4 MG/2ML IJ SOLN: 4.0000 mg | Freq: Four times a day (QID) | INTRAMUSCULAR | Status: DC | PRN

## 2012-12-24 MED ORDER — OMEGA-3-ACID ETHYL ESTERS 1 G PO CAPS
1.0000 g | ORAL_CAPSULE | Freq: Two times a day (BID) | ORAL | Status: DC
  Administered 2012-12-24 – 2012-12-26 (×5): 1 g via ORAL
  Filled 2012-12-24 (×6): qty 1

## 2012-12-24 MED ORDER — POTASSIUM CHLORIDE CRYS ER 20 MEQ PO TBCR
20.0000 meq | EXTENDED_RELEASE_TABLET | Freq: Every day | ORAL | Status: DC
  Administered 2012-12-25 – 2012-12-26 (×2): 20 meq via ORAL
  Filled 2012-12-24 (×3): qty 1

## 2012-12-24 MED ORDER — LORAZEPAM 0.5 MG PO TABS
0.5000 mg | ORAL_TABLET | Freq: Every evening | ORAL | Status: DC | PRN
  Administered 2012-12-24: 0.5 mg via ORAL
  Filled 2012-12-24: qty 1

## 2012-12-24 MED ORDER — FINASTERIDE 5 MG PO TABS
5.0000 mg | ORAL_TABLET | Freq: Every day | ORAL | Status: DC
  Administered 2012-12-25 – 2012-12-26 (×2): 5 mg via ORAL
  Filled 2012-12-24 (×3): qty 1

## 2012-12-24 MED ORDER — WARFARIN SODIUM 5 MG PO TABS
5.0000 mg | ORAL_TABLET | Freq: Once | ORAL | Status: AC
  Administered 2012-12-24: 5 mg via ORAL
  Filled 2012-12-24: qty 1

## 2012-12-24 MED ORDER — GLUCERNA SHAKE PO LIQD
237.0000 mL | Freq: Every day | ORAL | Status: DC
  Administered 2012-12-24 – 2012-12-26 (×3): 237 mL via ORAL

## 2012-12-24 MED ORDER — SODIUM CHLORIDE 0.9 % IV SOLN: 250.0000 mL | INTRAVENOUS | Status: DC | PRN

## 2012-12-24 MED ORDER — OMEGA-3 FATTY ACIDS 1000 MG PO CAPS: 1.0000 g | ORAL_CAPSULE | ORAL | Status: DC

## 2012-12-24 MED ORDER — SODIUM CHLORIDE 0.9 % IV SOLN
INTRAVENOUS | Status: DC
  Administered 2012-12-24: 250 mL via INTRAVENOUS

## 2012-12-24 MED ORDER — ASPIRIN EC 81 MG PO TBEC
81.0000 mg | DELAYED_RELEASE_TABLET | Freq: Every day | ORAL | Status: DC
  Administered 2012-12-25 – 2012-12-26 (×2): 81 mg via ORAL
  Filled 2012-12-24 (×2): qty 1

## 2012-12-24 MED ORDER — WARFARIN - PHARMACIST DOSING INPATIENT: Freq: Every day | Status: DC

## 2012-12-24 MED ORDER — SODIUM CHLORIDE 0.9 % IJ SOLN: 3.0000 mL | INTRAMUSCULAR | Status: DC | PRN

## 2012-12-24 MED ORDER — INSULIN ASPART 100 UNIT/ML ~~LOC~~ SOLN
0.0000 [IU] | Freq: Three times a day (TID) | SUBCUTANEOUS | Status: DC
  Administered 2012-12-24: 3 [IU] via SUBCUTANEOUS
  Administered 2012-12-25: 2 [IU] via SUBCUTANEOUS
  Administered 2012-12-25: 1 [IU] via SUBCUTANEOUS

## 2012-12-24 MED ORDER — FUROSEMIDE 10 MG/ML IJ SOLN
40.0000 mg | Freq: Two times a day (BID) | INTRAMUSCULAR | Status: DC
  Administered 2012-12-24 – 2012-12-25 (×3): 40 mg via INTRAVENOUS
  Filled 2012-12-24 (×4): qty 4

## 2012-12-24 MED ORDER — ADULT MULTIVITAMIN W/MINERALS CH
1.0000 | ORAL_TABLET | Freq: Every day | ORAL | Status: DC
  Administered 2012-12-24 – 2012-12-26 (×3): 1 via ORAL
  Filled 2012-12-24 (×3): qty 1

## 2012-12-24 MED ORDER — EZETIMIBE 10 MG PO TABS
10.0000 mg | ORAL_TABLET | Freq: Every day | ORAL | Status: DC
  Administered 2012-12-24 – 2012-12-25 (×2): 10 mg via ORAL
  Filled 2012-12-24 (×3): qty 1

## 2012-12-24 NOTE — Progress Notes (Signed)
Pt arrived from ED A&0x3.  Oriented to room and verbalized understanding.  Dr.  Arbutus Leas notified of pt's arrival.  Instructed to put in cardiac diet instead of regular for pt.  Will continue to monitor.  David Davidson, Charity fundraiser.

## 2012-12-24 NOTE — Progress Notes (Signed)
Report received that pt is A fib and noted pt with hx of A. Fib.  Noted on monitor pt run a flutter and SR.  EKG showed A. Flutter with variable AV block. RBBB.  Pt asymptomatic.  Dr. Arbutus Leas notified.  Will continue to monitor.  Amanda Pea, Charity fundraiser.

## 2012-12-24 NOTE — Progress Notes (Signed)
Pt stated has living will at home and family will bring copy in.  Amanda Pea, Charity fundraiser.

## 2012-12-24 NOTE — H&P (Signed)
[facility] History and Physical  David Davidson:[id number] DOB: July 23, 1910 DOA: 12/24/2012   PCP: David Headings, MD   Chief Complaint: chest pain and SOB  HPI:  79 year old male with a history of systolic and diastolic CHF, coronary artery disease status post CABG (1983+ 1993), hypertension, diabetes mellitus type 2, and atrial fibrillation presents with 2 week history of progressive dyspnea on exertion. In addition, the patient has been having chest discomfort, described as a pressure in the substernal area. Denies any dizziness, nausea, diaphoresis, syncope. He also complaints of  PND a few times the past one to 2 weeks. He denies any fevers, chills, dizziness, syncope, coughing, hemoptysis nausea, vomiting, diarrhea, abdominal pain, dysuria, hematuria, rashes.  In the emergency department, the patient was found to have atrial fibrillation, heart rate 76, proBNP 653, INR 2.17, platelets 87,000, BUN 23 creatinine 1.04. Chest x-ray showed pulmonary vascular congestion.  Assessment/Plan: Acute on chronic diastolic CHF  -CXR with pulm vascular congestion -Furosemide 40 mg IV every 12 hours  -Daily weights  -Strict I.'s and O.'s  -Echocardiogram 08/20/2012 showed EF 45%, grade 2 diastolic dysfunction, severe HK of inferior base  -Check TSH  -doppler right LE--edema>LLE chest discomfort in a patient with coronary artery disease  -Cycle troponins  Diabetes mellitus type 2  -Check hemoglobin A1c--7.3 on 08/20/12 -Discontinue Diabeta  -Place patient on NovoLog sliding scale, however I question the need for continued CBG checks given this patient's age and current recommendations Atrial fibrillation  -Rate controlled without any chronotropic meds  -Continue warfarin  -INR 2.17 at time of admission  Hyperlipidemia  -Continue Zetia  RBBB -Unchanged from previous EKGs CODE STATUS  -DO NOT RESUSCITATE        Past Medical History  Diagnosis Date  . Myocardial infarct    . Diabetes mellitus   . Hypertension   . CHF (congestive heart failure)   . Coronary artery disease   . Dysrhythmia   . Clostridium difficile diarrhea     recurrent   . Cancer     Bladder cancer  . Hyperlipidemia   . Pneumonia 04/2011  . Paroxysmal atrial fibrillation    Past Surgical History  Procedure Laterality Date  . Coronary artery bypass graft    . Cholecystectomy    . Cardiac catheterization  10/10/2001  . Cystoscopy, turbt  05/09/2006, 02/19/2006, 04/19/2005, 04/21/2003  . Colonoscopy  01/31/2012    Procedure: COLONOSCOPY;  Surgeon: Iva Boop, MD;  Location: [facility];  Service: Endoscopy;  Laterality: N/A;  fecal transplant/cynthia snyder will prepare the sample   Social History:  reports that he quit smoking about 38 years ago. His smoking use included Cigarettes. He smoked 1.00 pack per day. He has never used smokeless tobacco. He reports that he does not drink alcohol or use illicit drugs.   Family History  Problem Relation Age of Onset  . Stomach cancer Mother   . Coronary artery disease Father   . Diabetes Son   . Colon cancer Neg Hx      Allergies  Allergen Reactions  . Morphine And Related Other (See Comments)    Reaction unknown      Prior to Admission medications   Medication Sig Start Date End Date Taking? Authorizing Provider  ezetimibe (ZETIA) 10 MG tablet Take 10 mg by mouth at bedtime.    Yes Historical Provider, MD  feeding supplement (GLUCERNA SHAKE) LIQD Take 237 mLs by mouth daily. 08/21/11  Yes Cristal Ford, MD  finasteride (PROSCAR) 5 MG  tablet Take 5 mg by mouth every morning.    Yes Historical Provider, MD  fish oil-omega-3 fatty acids 1000 MG capsule Take 1 g by mouth every morning.    Yes Historical Provider, MD  furosemide (LASIX) 40 MG tablet Take 1 tablet (40 mg total) by mouth daily. 08/23/12  Yes Zannie Cove, MD  gemfibrozil (LOPID) 600 MG tablet Take 600 mg by mouth 2 (two) times daily before a meal.    Yes Historical  Provider, MD  glyBURIDE (DIABETA) 5 MG tablet Take 2.5-5 mg by mouth daily as needed.    Yes Antonieta Pert, MD  ibuprofen (ADVIL,MOTRIN) 200 MG tablet Take 200 mg by mouth every 6 (six) hours as needed for pain.   Yes Historical Provider, MD  LORazepam (ATIVAN) 0.5 MG tablet Take 0.5 mg by mouth at bedtime as needed. For insomnia.   Yes Historical Provider, MD  metoprolol tartrate (LOPRESSOR) 12.5 mg TABS Take 0.5 tablets (12.5 mg total) by mouth 2 (two) times daily. 08/23/12  Yes Zannie Cove, MD  Multiple Vitamin (MULTIVITAMIN WITH MINERALS) TABS Take 1 tablet by mouth daily.   Yes Historical Provider, MD  warfarin (COUMADIN) 5 MG tablet Take 5-7.5 mg by mouth daily. 1.5 tabs on Sunday, 1 tab daily all other days.   Yes Historical Provider, MD  aspirin EC 81 MG tablet Take 81 mg by mouth daily.    Historical Provider, MD  Multiple Vitamins-Minerals (ICAPS PO) Take 1-2 capsules by mouth daily.    Historical Provider, MD    Review of Systems:  Constitutional:  No weight loss, night sweats, Fevers, chills, fatigue.  Head&Eyes: No headache.  No vision loss.  No eye pain or scotoma ENT:  No Difficulty swallowing,Tooth/dental problems,Sore throat,  No ear ache, post nasal drip,  Cardio-vascular:  No chest pain, Orthopnea, PND, swelling in lower extremities,  dizziness, palpitations  GI:  No  abdominal pain, nausea, vomiting, diarrhea, loss of appetite, hematochezia, melena, heartburn, indigestion, Resp:  No shortness of breath with exertion or at rest. No cough. No coughing up of blood .No wheezing.No chest wall deformity  Skin:  no rash or lesions.  GU:  no dysuria, change in color of urine, no urgency or frequency. No flank pain.  Musculoskeletal:  No joint pain or swelling. No decreased range of motion. No back pain.  Psych:  No change in mood or affect. No depression or anxiety. Neurologic: No headache, no dysesthesia, no focal weakness, no vision loss. No syncope  Physical  Exam: Filed Vitals:   12/24/12 0800 12/24/12 0915 12/24/12 0930 12/24/12 0945  BP: 137/64 125/64 128/65 126/63  Pulse: 76 69 66 67  Temp:      TempSrc:      Resp: 9 11 16 16   SpO2: 99% 98% 99% 98%   General:  A&O x 3, NAD, nontoxic, pleasant/cooperative Head/Eye: No conjunctival hemorrhage, no icterus, [place]/AT, No nystagmus ENT:  No icterus,  No thrush, no pharyngeal exudate Neck:  No masses, no lymphadenpathy, no bruits CV:  IRRR, no rub, no gallop, no S3 Lung:  Bibasilar crackles with diminished breath sounds at the bases. No wheezes. Good air movement. Abdomen: soft/NT, +BS, nondistended, no peritoneal signs Ext: No cyanosis,  No petechiae, No lymphangitis, 2+ edema right lower extremity. 1+ edema left lower extremity. Bilateral lower extremities with patches of erythema and lichenification consistent with venous stasis dermatitis   Labs on Admission:  Basic Metabolic Panel:  Recent Labs Lab 12/24/12 0814  NA 142  K  3.6  CL 104  CO2 27  GLUCOSE 140*  BUN 23  CREATININE 1.04  CALCIUM 9.0   Liver Function Tests:  Recent Labs Lab 12/24/12 0814  AST 18  ALT 11  ALKPHOS 43  BILITOT 0.4  PROT 6.7  ALBUMIN 3.5   No results found for this basename: LIPASE, AMYLASE,  in the last 168 hours No results found for this basename: AMMONIA,  in the last 168 hours CBC:  Recent Labs Lab 12/24/12 0814  WBC 4.4  NEUTROABS 2.0  HGB 11.8*  HCT 34.5*  MCV 96.4  PLT 87*   Cardiac Enzymes: No results found for this basename: CKTOTAL, CKMB, CKMBINDEX, TROPONINI,  in the last 168 hours BNP: No components found with this basename: POCBNP,  CBG: No results found for this basename: GLUCAP,  in the last 168 hours  Radiological Exams on Admission: Dg Chest 2 View  12/24/2012   *RADIOLOGY REPORT*  Clinical Data: Chest pain and shortness of breath.  CHEST - 2 VIEW  Comparison: Chest x-ray 08/20/2012.  Findings: Chronic elevation of the left hemidiaphragm is unchanged. Linear  opacities at the left base are similar to the prior study, most compatible with chronic scarring.  No definite pleural effusions.  Mild pulmonary venous congestion, without frank pulmonary edema.  Mild cardiomegaly is unchanged. The patient is rotated to the right on today's exam, resulting in distortion of the mediastinal contours and reduced diagnostic sensitivity and specificity for mediastinal pathology.  Atherosclerosis in the thoracic aorta.  Status post median sternotomy for CABG, likely with a LIMA.  IMPRESSION: 1.  Mild cardiomegaly with mild pulmonary venous congestion, but no frank pulmonary edema. 2.  Chronic elevation of the left hemidiaphragm with scarring in the left lower lobe again noted. 3.  Atherosclerosis.   Original Report Authenticated By: Trudie Reed, M.D.    EKG: Independently reviewed. RBBB, Aflutter    Time spent:70 minutes Code Status:   DNR Family Communication:   Son at bedside   Shaylynn Nulty, DO  [facility] Pager [phone]  If 7PM-7AM, please contact night-coverage [email or web address] Password [facility] 12/24/2012, 10:26 AM

## 2012-12-24 NOTE — ED Notes (Signed)
Report to ty

## 2012-12-24 NOTE — Progress Notes (Signed)
Pt with NPO order and requesting lunch tray.  Dr. Arbutus Leas notified that pt has NPO order from the ED .  Instructed ok for pt to have heart health diet as was previously ordered.  Amanda Pea, Charity fundraiser.

## 2012-12-24 NOTE — ED Provider Notes (Signed)
History    CSN: [id number] Arrival date & time 12/24/12  4098  First MD Initiated Contact with Patient 12/24/12 0750     Chief Complaint  Patient presents with  . Chest Pain   (Consider location/radiation/quality/duration/timing/severity/associated sxs/prior Treatment) Patient is a 79 y.o. Davidson presenting with chest pain. The history is provided by the patient and a relative.  Chest Pain  patient here complaining of constant substernal chest pressure x3 days worse for the past 24 hours. Notes orthopnea and dyspnea on exertion. Chest pressure has been constant and is worse with exertion. No medications taken for this. Some nonproductive cough without fever. No vomiting or diarrhea. Denies any black or bloody stools. Does note increased weakness. No recent changes in his medications. Patient has a history of CHF and angina Past Medical History  Diagnosis Date  . Myocardial infarct   . Diabetes mellitus   . Hypertension   . CHF (congestive heart failure)   . Coronary artery disease   . Dysrhythmia   . Clostridium difficile diarrhea     recurrent   . Cancer     Bladder cancer  . Hyperlipidemia   . Pneumonia 04/2011  . Paroxysmal atrial fibrillation    Past Surgical History  Procedure Laterality Date  . Coronary artery bypass graft    . Cholecystectomy    . Cardiac catheterization  10/10/2001  . Cystoscopy, turbt  05/09/2006, 02/19/2006, 04/19/2005, 04/21/2003  . Colonoscopy  01/31/2012    Procedure: COLONOSCOPY;  Surgeon: Iva Boop, MD;  Location: [facility];  Service: Endoscopy;  Laterality: N/A;  fecal transplant/cynthia snyder will prepare the sample   Family History  Problem Relation Age of Onset  . Stomach cancer Mother   . Coronary artery disease Father   . Diabetes Son   . Colon cancer Neg Hx    History  Substance Use Topics  . Smoking status: Former Smoker -- 1.00 packs/day    Types: Cigarettes    Quit date: 05/08/1974  . Smokeless tobacco: Never Used  .  Alcohol Use: No     Comment: occasionally    Review of Systems  Cardiovascular: Positive for chest pain.  All other systems reviewed and are negative.    Allergies  Morphine and related  Home Medications   Current Outpatient Rx  Name  Route  Sig  Dispense  Refill  . aspirin EC 81 MG tablet   Oral   Take 81 mg by mouth daily.         Marland Kitchen ezetimibe (ZETIA) 10 MG tablet   Oral   Take 10 mg by mouth at bedtime.          . feeding supplement (GLUCERNA SHAKE) LIQD   Oral   Take 237 mLs by mouth daily.         . finasteride (PROSCAR) 5 MG tablet   Oral   Take 5 mg by mouth every morning.          . fish oil-omega-3 fatty acids 1000 MG capsule   Oral   Take 1 g by mouth every morning.          . furosemide (LASIX) 40 MG tablet   Oral   Take 1 tablet (40 mg total) by mouth daily.   30 tablet   0   . gemfibrozil (LOPID) 600 MG tablet   Oral   Take 600 mg by mouth 2 (two) times daily before a meal.          .  glyBURIDE (DIABETA) 5 MG tablet   Oral   Take 2.5-5 mg by mouth daily as needed.          Marland Kitchen LORazepam (ATIVAN) 0.5 MG tablet   Oral   Take 0.5 mg by mouth at bedtime as needed. For insomnia.         . metoprolol tartrate (LOPRESSOR) 12.5 mg TABS   Oral   Take 0.5 tablets (12.5 mg total) by mouth 2 (two) times daily.   60 tablet   0   . Multiple Vitamin (MULTIVITAMIN WITH MINERALS) TABS   Oral   Take 1 tablet by mouth daily.         . Multiple Vitamins-Minerals (ICAPS PO)   Oral   Take 1-2 capsules by mouth daily.         Marland Kitchen warfarin (COUMADIN) 5 MG tablet   Oral   Take 5-7.5 mg by mouth daily. 1.5 tabs on Sunday, 1 tab daily all other days.          BP 132/86  Temp(Src) 98.7 F (37.1 C) (Oral)  Resp 15  SpO2 94% Physical Exam  Nursing note and vitals reviewed. Constitutional: He is oriented to person, place, and time. He appears well-developed and well-nourished.  Non-toxic appearance. No distress.  HENT:  Head:  Normocephalic and atraumatic.  Eyes: Conjunctivae, EOM and lids are normal. Pupils are equal, round, and reactive to light.  Neck: Normal range of motion. Neck supple. No tracheal deviation present. No mass present.  Cardiovascular: Normal rate, regular rhythm and normal heart sounds.  Exam reveals no gallop.   No murmur heard. Pulmonary/Chest: Effort normal. No stridor. No respiratory distress. He has decreased breath sounds. He has no wheezes. He has rhonchi. He has no rales.  Abdominal: Soft. Normal appearance and bowel sounds are normal. He exhibits no distension. There is no tenderness. There is no rebound and no CVA tenderness.  Musculoskeletal: Normal range of motion. He exhibits no edema and no tenderness.  Bilateral lower extremity pedal edema  Neurological: He is alert and oriented to person, place, and time. He has normal strength. No cranial nerve deficit or sensory deficit. GCS eye subscore is 4. GCS verbal subscore is 5. GCS motor subscore is 6.  Skin: Skin is warm and dry. No abrasion and no rash noted.  Psychiatric: He has a normal mood and affect. His speech is normal and behavior is normal.    ED Course  Procedures (including critical care time) Labs Reviewed  COMPREHENSIVE METABOLIC PANEL  CBC WITH DIFFERENTIAL  PRO B NATRIURETIC PEPTIDE  PROTIME-INR   No results found. No diagnosis found.  MDM   Date: 12/24/2012  Rate: David  Rhythm: normal sinus rhythm  QRS Axis: normal  Intervals: normal  ST/T Wave abnormalities: nonspecific ST changes  Conduction Disutrbances:first-degree A-V block   Narrative Interpretation:   Old EKG Reviewed: unchanged   9:58 AM Patient to be admitted to the hospital for further evaluation by [facility]  Toy Baker, MD 12/24/12 [phone]

## 2012-12-24 NOTE — Progress Notes (Signed)
ANTICOAGULATION CONSULT NOTE - Initial Consult  Pharmacy Consult for Coumadin Indication: atrial fibrillation  Allergies  Allergen Reactions  . Morphine And Related Other (See Comments)    Reaction unknown   Labs:  Recent Labs  12/24/12 0814  HGB 11.8*  HCT 34.5*  PLT 87*  LABPROT 23.5*  INR 2.17*  CREATININE 1.04    The CrCl is unknown because both a height and weight (above a minimum accepted value) are required for this calculation.   Medical History: Past Medical History  Diagnosis Date  . Myocardial infarct   . Diabetes mellitus   . Hypertension   . CHF (congestive heart failure)   . Coronary artery disease   . Dysrhythmia   . Clostridium difficile diarrhea     recurrent   . Cancer     Bladder cancer  . Hyperlipidemia   . Pneumonia 04/2011  . Paroxysmal atrial fibrillation     Assessment: 79 year old male admitted with CHF exacerbation.  On Coumadin PTA for Afib.  INR therapeutic on home dose -- 5 mg daily except 7.5 mg on Sundays  Goal of Therapy:  INR 2-3 Monitor platelets by anticoagulation protocol: Yes   Plan:  1) Coumadin 5 mg po x 1 dose tonight 2) Daily INR for now  Thank you. Okey Regal, PharmD [phone]  12/24/2012,12:27 PM

## 2012-12-24 NOTE — ED Notes (Signed)
Per ems chest pressure all day yesterday and off and on during the night. Was able to rest well during the night. Sob with exertion.

## 2012-12-25 DIAGNOSIS — E785 Hyperlipidemia, unspecified: Secondary | ICD-10-CM

## 2012-12-25 DIAGNOSIS — E118 Type 2 diabetes mellitus with unspecified complications: Secondary | ICD-10-CM

## 2012-12-25 DIAGNOSIS — I4891 Unspecified atrial fibrillation: Secondary | ICD-10-CM

## 2012-12-25 DIAGNOSIS — I5033 Acute on chronic diastolic (congestive) heart failure: Secondary | ICD-10-CM

## 2012-12-25 DIAGNOSIS — I509 Heart failure, unspecified: Secondary | ICD-10-CM

## 2012-12-25 DIAGNOSIS — I10 Essential (primary) hypertension: Secondary | ICD-10-CM

## 2012-12-25 LAB — GLUCOSE, CAPILLARY
Glucose-Capillary: 148 mg/dL — ABNORMAL HIGH (ref 70–99)
Glucose-Capillary: 178 mg/dL — ABNORMAL HIGH (ref 70–99)

## 2012-12-25 LAB — BASIC METABOLIC PANEL
Calcium: 8.7 mg/dL (ref 8.4–10.5)
Creatinine, Ser: 1.1 mg/dL (ref 0.50–1.35)
GFR calc non Af Amer: 53 mL/min — ABNORMAL LOW (ref >90)
Sodium: 142 mEq/L (ref 135–145)

## 2012-12-25 LAB — PROTIME-INR
INR: 2.27 — ABNORMAL HIGH (ref 0.00–1.49)
Prothrombin Time: 24.3 seconds — ABNORMAL HIGH (ref 11.6–15.2)

## 2012-12-25 MED ORDER — MUPIROCIN 2 % EX OINT
1.0000 "application " | TOPICAL_OINTMENT | Freq: Two times a day (BID) | CUTANEOUS | Status: DC
  Administered 2012-12-25 – 2012-12-26 (×3): 1 via NASAL
  Filled 2012-12-25: qty 22

## 2012-12-25 MED ORDER — GLYBURIDE 5 MG PO TABS
5.0000 mg | ORAL_TABLET | Freq: Every day | ORAL | Status: DC
  Administered 2012-12-26: 5 mg via ORAL
  Filled 2012-12-25 (×2): qty 1

## 2012-12-25 MED ORDER — CHLORHEXIDINE GLUCONATE CLOTH 2 % EX PADS
6.0000 | MEDICATED_PAD | Freq: Every day | CUTANEOUS | Status: DC
  Administered 2012-12-25 – 2012-12-26 (×2): 6 via TOPICAL

## 2012-12-25 MED ORDER — FUROSEMIDE 40 MG PO TABS
40.0000 mg | ORAL_TABLET | Freq: Every day | ORAL | Status: DC
  Administered 2012-12-25 – 2012-12-26 (×2): 40 mg via ORAL
  Filled 2012-12-25 (×2): qty 1

## 2012-12-25 MED ORDER — WARFARIN SODIUM 5 MG PO TABS
5.0000 mg | ORAL_TABLET | Freq: Once | ORAL | Status: AC
  Administered 2012-12-25: 5 mg via ORAL
  Filled 2012-12-25: qty 1

## 2012-12-25 NOTE — Progress Notes (Signed)
ANTICOAGULATION CONSULT NOTE  Pharmacy Consult for Coumadin Indication: atrial fibrillation  Allergies  Allergen Reactions  . Morphine And Related Other (See Comments)    Reaction unknown   Labs:  Recent Labs  12/24/12 0814 12/24/12 1440 12/24/12 1915 12/25/12 0145  HGB 11.8*  --   --   --   HCT 34.5*  --   --   --   PLT 87*  --   --   --   LABPROT 23.5*  --   --  24.3*  INR 2.17*  --   --  2.27*  CREATININE 1.04  --   --  1.10  TROPONINI  --  <0.30 <0.30 <0.30    Estimated Creatinine Clearance: 40.4 ml/min (by C-G formula based on Cr of 1.1).  Assessment: 79 year old male admitted with CHF exacerbation.  On Coumadin PTA for Afib.  INR therapeutic on home dose -- 5 mg daily except 7.5 mg on Sundays  Goal of Therapy:  INR 2-3 Monitor platelets by anticoagulation protocol: Yes   Plan:  1) Coumadin 5 mg po x 1 dose tonight 2) Daily INR for now  Thank you. Okey Regal, PharmD [phone]  12/25/2012,8:53 AM

## 2012-12-25 NOTE — Progress Notes (Signed)
UR completed 

## 2012-12-25 NOTE — Progress Notes (Signed)
Pt.is A/Ox4 and is ambulatory with 1 person assist. Pt.gait is unsteady and he uses urinal. Received call from labortory this am that MRSA was positive. Patient is on orange contact precautions for MRSA and is on enteric precautions for history of c.diff.

## 2012-12-25 NOTE — Care Management Note (Signed)
    Page 1 of 1   12/25/2012     3:21:13 PM   CARE MANAGEMENT NOTE 12/25/2012  Patient:  David Davidson, David Davidson   Account Number:  [id number]  Date Initiated:  12/25/2012  Documentation initiated by:  Oletta Cohn  Subjective/Objective Assessment:   79 yo man admitted with CP, CHF.///Home with family     Action/Plan:   Diurese///home with family   Anticipated DC Date:  12/28/2012   Anticipated DC Plan:  HOME W HOME HEALTH SERVICES      DC Planning Services  CM consult      [facility] Choice  HOME HEALTH   Choice offered to / List presented to:  C-1 Patient        HH arranged  HH-1 RN  HH-10 DISEASE MANAGEMENT      HH agency  [facility]   Status of service:   Medicare Important Message given?   (If response is "NO", the following Medicare IM given date [place] will be blank) Date Medicare IM given:   Date Additional Medicare IM given:    Discharge Disposition:    Per UR Regulation:    If discussed at Long Length of Stay Meetings, dates discussed:    Comments:  12/25/12 @ 1500.Marland KitchenMarland KitchenOletta Cohn, RN, BSN, [employer] [phone] Spoke with pt at bedside regarding discharge planning for Home Health RN.  Offered pt list of HH agencies.  Pt chose [facility] to render services.  Kizzie Furnish of [facility] notified.  No DME needs identified at this time.

## 2012-12-25 NOTE — Discharge Summary (Addendum)
Physician Discharge Summary  David Davidson ZOX:[id number] DOB: [birth year] DOA: 12/24/2012  PCP: [name], MD  Admit date: 12/24/2012 Discharge date: 12/25/2012  Time spent: 30 minutes  Recommendations for Outpatient Follow-up:  1. CHF patient back to baseline, troponin x3 are negative, BNP= 653.5 patient's base weight= 209lbs, currently at 204 Lbs 2. Proximal A. fib; patient continues to be in A. fib, continue warfarin. Patient currently therapeutic INR= 2.27 3. DM; consultation that his hemoglobin A1c at 7.3 is not within ADA guidelines, restarted patient's outpatient medication. Patient will need to followup with PCP for medication adjustment. 4. HLD; currently within ADA guideline no changes her poor 5. Coordination; patient still having some balance problems, will arrange for home physical therapy for strengthening and range of motion   2. Discharge Diagnoses:  Principal Problem:   Systolic CHF, acute Active Problems:   Paroxysmal a-fib   Hyperlipidemia   Acute on chronic diastolic CHF (congestive heart failure)   DM2 (diabetes mellitus, type 2)   Acute on chronic combined systolic and diastolic heart failure   Discharge Condition: Stable  Diet recommendation: Heart healthy, diabetic  Filed Weights   12/24/12 1226 12/25/12 0500 12/25/12 0618  Weight: 93.5 kg (206 lb 2.1 oz) 92.2 kg (203 lb 4.2 oz) 92.2 kg (203 lb 4.2 oz)    History of present illness:  79yo   WM PMHx Systolic and diastolic CHF, CAD S/P CABG (1983+ 1993), HTN, DM, type 2, and A-Fib . Presented w/ 2 week history of progressive dyspnea on exertion. In addition, the patient has been having chest discomfort, described as a pressure in the substernal area. Denies any dizziness, nausea, diaphoresis, syncope. He also complaints of PND a few times the past one to 2 weeks. He denies any fevers, chills, dizziness, syncope, coughing, hemoptysis nausea, vomiting, diarrhea, abdominal pain, dysuria, hematuria,  rashes.  In the emergency department, the patient was found to have atrial fibrillation, heart rate 76, proBNP 653, INR 2.17, platelets 87,000, BUN 23 creatinine 1.04. Chest x-ray showed pulmonary vascular congestion. TODAY states feels that he is at baseline no further episodes of SOB. States negative pain in lower legs. Ready for discharge   Hospital Course: See above   Procedures: Echocardiogram 08/20/2012  showed LVEF 45%, grade 2 diastolic dysfunction, severe HK of inferior base   LE Doppler 12/25/2012; Preliminary report: Right: No evidence of DVT, superficial thrombosis, or Baker's cyst.    Discharge Exam: Filed Vitals:   12/25/12 0618 12/25/12 0917 12/25/12 1350 12/25/12 1547  BP: 132/74 118/62 136/68   Pulse: 76 79 74   Temp: 98 F (36.7 C)  97.9 F (36.6 C)   TempSrc: Oral  Oral   Resp: 20 18 18 18   Height:      Weight: 92.2 kg (203 lb 4.2 oz)     SpO2: 97% 95% 97% 96%    General: Alert,NAD Cardiovascular: Irregular irregular rhythm and rate, positive grade 2 systolic murmur (patient states has had since birth) Respiratory: Clear to auscultation bilateral Musculoskeletal; bilateral lower extremity erythema, dermatitis. Negative signs and symptoms of infection.  Discharge Instructions     Medication List    ASK your doctor about these medications       aspirin EC 81 MG tablet  Take 81 mg by mouth daily.     ezetimibe 10 MG tablet  Commonly known as:  ZETIA  Take 10 mg by mouth at bedtime.     feeding supplement Liqd  Take 237 mLs by mouth daily.  finasteride 5 MG tablet  Commonly known as:  PROSCAR  Take 5 mg by mouth every morning.     fish oil-omega-3 fatty acids 1000 MG capsule  Take 1 g by mouth every morning.     furosemide 40 MG tablet  Commonly known as:  LASIX  Take 1 tablet (40 mg total) by mouth daily.     gemfibrozil 600 MG tablet  Commonly known as:  LOPID  Take 600 mg by mouth 2 (two) times daily before a meal.     glyBURIDE  5 MG tablet  Commonly known as:  DIABETA  Take 2.5-5 mg by mouth daily as needed.     ibuprofen 200 MG tablet  Commonly known as:  ADVIL,MOTRIN  Take 200 mg by mouth every 6 (six) hours as needed for pain.     ICAPS PO  Take 1-2 capsules by mouth daily.     LORazepam 0.5 MG tablet  Commonly known as:  ATIVAN  Take 0.5 mg by mouth at bedtime as needed. For insomnia.     metoprolol tartrate 12.5 mg Tabs  Commonly known as:  LOPRESSOR  Take 0.5 tablets (12.5 mg total) by mouth 2 (two) times daily.     multivitamin with minerals Tabs  Take 1 tablet by mouth daily.     warfarin 5 MG tablet  Commonly known as:  COUMADIN  Take 5-7.5 mg by mouth daily. 1.5 tabs on Sunday, 1 tab daily all other days.       Allergies  Allergen Reactions  . Morphine And Related Other (See Comments)    Reaction unknown      The results of significant diagnostics from this hospitalization (including imaging, microbiology, ancillary and laboratory) are listed below for reference.    Significant Diagnostic Studies: Dg Chest 2 View  12/24/2012   *RADIOLOGY REPORT*  Clinical Data: Chest pain and shortness of breath.  CHEST - 2 VIEW  Comparison: Chest x-ray 08/20/2012.  Findings: Chronic elevation of the left hemidiaphragm is unchanged. Linear opacities at the left base are similar to the prior study, most compatible with chronic scarring.  No definite pleural effusions.  Mild pulmonary venous congestion, without frank pulmonary edema.  Mild cardiomegaly is unchanged. The patient is rotated to the right on today's exam, resulting in distortion of the mediastinal contours and reduced diagnostic sensitivity and specificity for mediastinal pathology.  Atherosclerosis in the thoracic aorta.  Status post median sternotomy for CABG, likely with a LIMA.  IMPRESSION: 1.  Mild cardiomegaly with mild pulmonary venous congestion, but no frank pulmonary edema. 2.  Chronic elevation of the left hemidiaphragm with scarring  in the left lower lobe again noted. 3.  Atherosclerosis.   Original Report Authenticated By: Trudie Reed, M.D.    Microbiology: Recent Results (from the past 240 hour(s))  MRSA PCR SCREENING     Status: Abnormal   Collection Time    12/24/12 11:00 PM      Result Value Range Status   MRSA by PCR POSITIVE (*) NEGATIVE Final   Comment:            The GeneXpert MRSA Assay (FDA     approved for NASAL specimens     only), is one component of a     comprehensive MRSA colonization     surveillance program. It is not     intended to diagnose MRSA     infection nor to guide or     monitor treatment for  MRSA infections.     RESULT CALLED TO, READ BACK BY AND VERIFIED WITH:     Elgie Collard RN [phone] GREEN [phone]     Labs: Basic Metabolic Panel:  Recent Labs Lab 12/24/12 0814 12/25/12 0145  NA 142 142  K 3.6 3.8  CL 104 105  CO2 27 29  GLUCOSE 140* 132*  BUN 23 24*  CREATININE 1.04 1.10  CALCIUM 9.0 8.7   Liver Function Tests:  Recent Labs Lab 12/24/12 0814  AST 18  ALT 11  ALKPHOS 43  BILITOT 0.4  PROT 6.7  ALBUMIN 3.5   No results found for this basename: LIPASE, AMYLASE,  in the last 168 hours No results found for this basename: AMMONIA,  in the last 168 hours CBC:  Recent Labs Lab 12/24/12 0814  WBC 4.4  NEUTROABS 2.0  HGB 11.8*  HCT 34.5*  MCV 96.4  PLT 87*   Cardiac Enzymes:  Recent Labs Lab 12/24/12 1440 12/24/12 1915 12/25/12 0145  TROPONINI <0.30 <0.30 <0.30   BNP: BNP (last 3 results)  Recent Labs  08/20/12 0955 12/24/12 0814  PROBNP 761.3* 653.5*   CBG:  Recent Labs Lab 12/24/12 1240 12/24/12 1655 12/24/12 2052 12/25/12 0640 12/25/12 1116  GLUCAP 132* 215* 164* 148* 178*       Signed:  WOODS, CURTIS, J  [facility] 12/25/2012, 4:05 PM

## 2012-12-25 NOTE — Progress Notes (Signed)
Pt put himself on RA. Rn check stats 95-96% on RA. Will continue to monitor

## 2012-12-25 NOTE — Progress Notes (Signed)
VASCULAR LAB PRELIMINARY  PRELIMINARY  PRELIMINARY  PRELIMINARY  Right lower extremity venous duplex completed.    Preliminary report:  Right:  No evidence of DVT, superficial thrombosis, or Baker's cyst.  Jedadiah Abdallah, RVT 12/25/2012, 1:51 PM

## 2012-12-25 NOTE — Plan of Care (Signed)
Problem: Phase I Progression Outcomes Goal: EF % per last Echo/documented,Core Reminder form on chart Outcome: Completed/Met Date Met:  12/25/12 45%

## 2012-12-26 LAB — GLUCOSE, CAPILLARY: Glucose-Capillary: 174 mg/dL — ABNORMAL HIGH (ref 70–99)

## 2012-12-26 LAB — BASIC METABOLIC PANEL
BUN: 32 mg/dL — ABNORMAL HIGH (ref 6–23)
Chloride: 100 mEq/L (ref 96–112)
Creatinine, Ser: 1.3 mg/dL (ref 0.50–1.35)
GFR calc Af Amer: 50 mL/min — ABNORMAL LOW (ref >90)
GFR calc non Af Amer: 43 mL/min — ABNORMAL LOW (ref >90)
Potassium: 4.2 mEq/L (ref 3.5–5.1)

## 2012-12-26 LAB — CLOSTRIDIUM DIFFICILE BY PCR: Toxigenic C. Difficile by PCR: NEGATIVE

## 2012-12-26 LAB — PROTIME-INR: Prothrombin Time: 24.3 seconds — ABNORMAL HIGH (ref 11.6–15.2)

## 2012-12-26 MED ORDER — WARFARIN SODIUM 5 MG PO TABS
5.0000 mg | ORAL_TABLET | Freq: Once | ORAL | Status: DC
  Filled 2012-12-26: qty 1

## 2012-12-26 NOTE — Progress Notes (Signed)
ANTICOAGULATION CONSULT NOTE  Pharmacy Consult for Coumadin Indication: atrial fibrillation  Allergies  Allergen Reactions  . Morphine And Related Other (See Comments)    Reaction unknown   Labs:  Recent Labs  12/24/12 0814 12/24/12 1440 12/24/12 1915 12/25/12 0145 12/26/12 0600  HGB 11.8*  --   --   --   --   HCT 34.5*  --   --   --   --   PLT 87*  --   --   --   --   LABPROT 23.5*  --   --  24.3* 24.3*  INR 2.17*  --   --  2.27* 2.27*  CREATININE 1.04  --   --  1.10 1.30  TROPONINI  --  <0.30 <0.30 <0.30  --     Estimated Creatinine Clearance: 34.1 ml/min (by C-G formula based on Cr of 1.3).  Assessment: 79 year old male admitted with CHF exacerbation.  On Coumadin PTA for Afib.  INR therapeutic on home dose -- 5 mg daily except 7.5 mg on Sundays  Goal of Therapy:  INR 2-3 Monitor platelets by anticoagulation protocol: Yes   Plan:  1) Coumadin 5 mg po x 1 dose tonight 2) Daily INR for now  Thank you. Okey Regal, PharmD [phone]  12/26/2012,8:50 AM

## 2012-12-26 NOTE — Progress Notes (Signed)
Verified with Carmile,CM that pt has face to face order for d/c and she instructed that H/H has been set ok for pt to go home.  Also pt stated that they are suppose to call him.  Amanda Pea, Charity fundraiser.

## 2012-12-26 NOTE — Evaluation (Signed)
Occupational Therapy Evaluation and Discharge Patient Details Name: David Davidson MRN: [id number] DOB: 11-14-1910 Today's Date: 12/26/2012 Time: 7829-5621 OT Time Calculation (min): 22 min  OT Assessment / Plan / Recommendation History of present illness 79 year old male with a history of systolic and diastolic CHF, coronary artery disease status post CABG (1983+ 1993), hypertension, diabetes mellitus type 2, and atrial fibrillation presents with 2 week history of progressive dyspnea on exertion. In addition, the patient has been having chest discomfort, described as a pressure in the substernal area.    Clinical Impression   Pt admitted with above. Pt at an I/Mod I level and has prn A from family next door if needed. No further OT needs, will sign off.   OT Assessment  Patient does not need any further OT services    Follow Up Recommendations  No OT follow up       Equipment Recommendations  None recommended by OT          Precautions / Restrictions Precautions Precautions: Fall Restrictions Weight Bearing Restrictions: No   Pertinent Vitals/Pain 95 % O2 at rest; 91% O2 post ambulation--both on RA    ADL  Equipment Used: Gait belt;Rolling walker Transfers/Ambulation Related to ADLs: Mod I with all ADL Comments: I/Mod I with all        Visit Information  Last OT Received On: 12/26/12 Assistance Needed: +1 PT/OT Co-Evaluation/Treatment: Yes History of Present Illness: 79 year old male with a history of systolic and diastolic CHF, coronary artery disease status post CABG (1983+ 1993), hypertension, diabetes mellitus type 2, and atrial fibrillation presents with 2 week history of progressive dyspnea on exertion. In addition, the patient has been having chest discomfort, described as a pressure in the substernal area.        Prior Functioning     Home Living Family/patient expects to be discharged to:: Private residence Living Arrangements: Alone Available Help at  Discharge: Family;Available PRN/intermittently (live next door) Type of Home: House Home Access: Stairs to enter [employer] of Steps: 3 Entrance Stairs-Rails: Right Home Layout: One level Home Equipment: Walker - 2 wheels;Cane - single point;Shower seat Prior Function Level of Independence: Independent with assistive device(s) Comments: uses SPC outside, states no hx of falls Communication Communication: No difficulties Dominant Hand: Right         Vision/Perception Vision - History Patient Visual Report: No change from baseline   Cognition  Cognition Arousal/Alertness: Awake/alert Behavior During Therapy: WFL for tasks assessed/performed Overall Cognitive Status: Within Functional Limits for tasks assessed    Extremity/Trunk Assessment Upper Extremity Assessment Upper Extremity Assessment: Overall WFL for tasks assessed Lower Extremity Assessment Lower Extremity Assessment: Overall WFL for tasks assessed     Mobility Bed Mobility Bed Mobility: Supine to Sit;Sitting - Scoot to Edge of Bed Supine to Sit: 7: Independent;HOB elevated Sitting - Scoot to Edge of Bed: 7: Independent Transfers Transfers: Sit to Stand;Stand to Sit Sit to Stand: 6: Modified independent (Device/Increase time);With upper extremity assist;From bed Stand to Sit: 6: Modified independent (Device/Increase time);With upper extremity assist;With armrests;To chair/3-in-1           End of Session OT - End of Session Equipment Utilized During Treatment: Gait belt;Rolling walker Activity Tolerance: Patient tolerated treatment well Patient left: in chair;with call bell/phone within reach       Evette Georges [phone] 12/26/2012, 9:11 AM

## 2012-12-26 NOTE — Progress Notes (Signed)
All d/c instructions explained as given to pt.  Verbalized understanding.  Pt waiting for his son to take him home.  Amanda Pea, Charity fundraiser.

## 2012-12-26 NOTE — Progress Notes (Signed)
Pt d/c home.  Escorted by his son.  Off floor via w/c.  Amanda Pea, Charity fundraiser.

## 2012-12-26 NOTE — Evaluation (Signed)
Physical Therapy Evaluation Patient Details Name: David Davidson MRN: [id number] DOB: December 21, 1910 Today's Date: 12/26/2012 Time: 0840-0900 PT Time Calculation (min): 20 min  PT Assessment / Plan / Recommendation History of Present Illness  79 year old male with a history of systolic and diastolic CHF, coronary artery disease status post CABG (1983+ 1993), hypertension, diabetes mellitus type 2, and atrial fibrillation presents with 2 week history of progressive dyspnea on exertion. In addition, the patient has been having chest discomfort, described as a pressure in the substernal area.   Clinical Impression  Pt very motivated to return home however pt with impaired endurance and mild unsteady gait therefore recommend acute PT services to continue to follow for gait training, DME instructions and stair training.       PT Assessment  Patient needs continued PT services    Follow Up Recommendations  Home health PT;Supervision/Assistance - 24 hour    Equipment Recommendations  Rolling walker with 5" wheels    Frequency Min 3X/week    Precautions / Restrictions Precautions Precautions: Fall Restrictions Weight Bearing Restrictions: No   Pertinent Vitals/Pain No c/o pain; 95 % O2 at rest; 91% O2 post ambulation--both on RA       Mobility  Bed Mobility Bed Mobility: Supine to Sit;Sitting - Scoot to Edge of Bed Supine to Sit: 7: Independent;HOB elevated Sitting - Scoot to Edge of Bed: 7: Independent Transfers Transfers: Sit to Stand;Stand to Sit Sit to Stand: 6: Modified independent (Device/Increase time);With upper extremity assist;From bed Stand to Sit: 6: Modified independent (Device/Increase time);With upper extremity assist;With armrests;To chair/3-in-1 Ambulation/Gait Ambulation/Gait Assistance: 4: Min guard Ambulation Distance (Feet): 200 Feet Assistive device: None;Rolling walker Ambulation/Gait Assistance Details: Ambulated 100' without AD and 100' with RW due to  unsteady gait without AD.  Cues for RW placement.  However increase stability with RW.  Gait Pattern: Step-through pattern;Decreased stride length;Shuffle Gait velocity: decreased Stairs: Yes Stairs Assistance: 4: Min assist Stairs Assistance Details (indicate cue type and reason): (A) to maintain balance with cues for step sequence Stair Management Technique: One rail Right;Step to pattern;Forwards Number of Stairs: 3    Exercises     PT Diagnosis: Difficulty walking;Generalized weakness  PT Problem List: Decreased activity tolerance;Decreased balance;Decreased mobility;Decreased knowledge of use of DME;Cardiopulmonary status limiting activity PT Treatment Interventions: DME instruction;Gait training;Stair training;Functional mobility training;Therapeutic activities;Therapeutic exercise;Balance training;Patient/family education     PT Goals(Current goals can be found in the care plan section) Acute Rehab PT Goals Patient Stated Goal: to go home PT Goal Formulation: With patient Time For Goal Achievement: 01/02/13 Potential to Achieve Goals: Good  Visit Information  Last PT Received On: 12/26/12 Assistance Needed: +1 History of Present Illness: 79 year old male with a history of systolic and diastolic CHF, coronary artery disease status post CABG (1983+ 1993), hypertension, diabetes mellitus type 2, and atrial fibrillation presents with 2 week history of progressive dyspnea on exertion. In addition, the patient has been having chest discomfort, described as a pressure in the substernal area.        Prior Functioning  Home Living Family/patient expects to be discharged to:: Private residence Living Arrangements: Alone Available Help at Discharge: Family;Available PRN/intermittently (live next door) Type of Home: House Home Access: Stairs to enter [employer] of Steps: 3 Entrance Stairs-Rails: Right Home Layout: One level Home Equipment: Walker - 2 wheels;Cane -  single point Prior Function Level of Independence: Independent with assistive device(s) Comments: uses SPC outside, states no hx of falls Communication Communication: No difficulties Dominant Hand: Right  Cognition  Cognition Arousal/Alertness: Awake/alert Behavior During Therapy: WFL for tasks assessed/performed Overall Cognitive Status: Within Functional Limits for tasks assessed    Extremity/Trunk Assessment Upper Extremity Assessment Upper Extremity Assessment: Overall WFL for tasks assessed Lower Extremity Assessment Lower Extremity Assessment: Overall WFL for tasks assessed   Balance    End of Session PT - End of Session Equipment Utilized During Treatment: Gait belt Activity Tolerance: Patient tolerated treatment well Patient left: in chair;with call bell/phone within reach Nurse Communication: Mobility status  GP     Ole Lafon 12/26/2012, 11:05 AM Jake Shark, PT DPT [phone]

## 2012-12-26 NOTE — Progress Notes (Signed)
Patient evaluated for long-term disease management services with [facility] as a benefit of his [employer] insurance. At bedside to explain services. However, he pleasantly declines. Left [facility] Care Management brochure at bedside for him to call office in case he changes his mind in the future.     Raiford Noble, MSN- Ed, RN,BSN, [facility], [phone]

## 2013-01-15 ENCOUNTER — Other Ambulatory Visit: Payer: Self-pay

## 2013-01-26 IMAGING — CR DG CHEST 2V
2 series · 2 of 2 positions shown · non-contrast
Comparison: Chest radiograph performed 10/31/2008

CLINICAL DATA: Cough, shortness of breath and fever.

CHEST - 2 VIEW

[w chest lat]
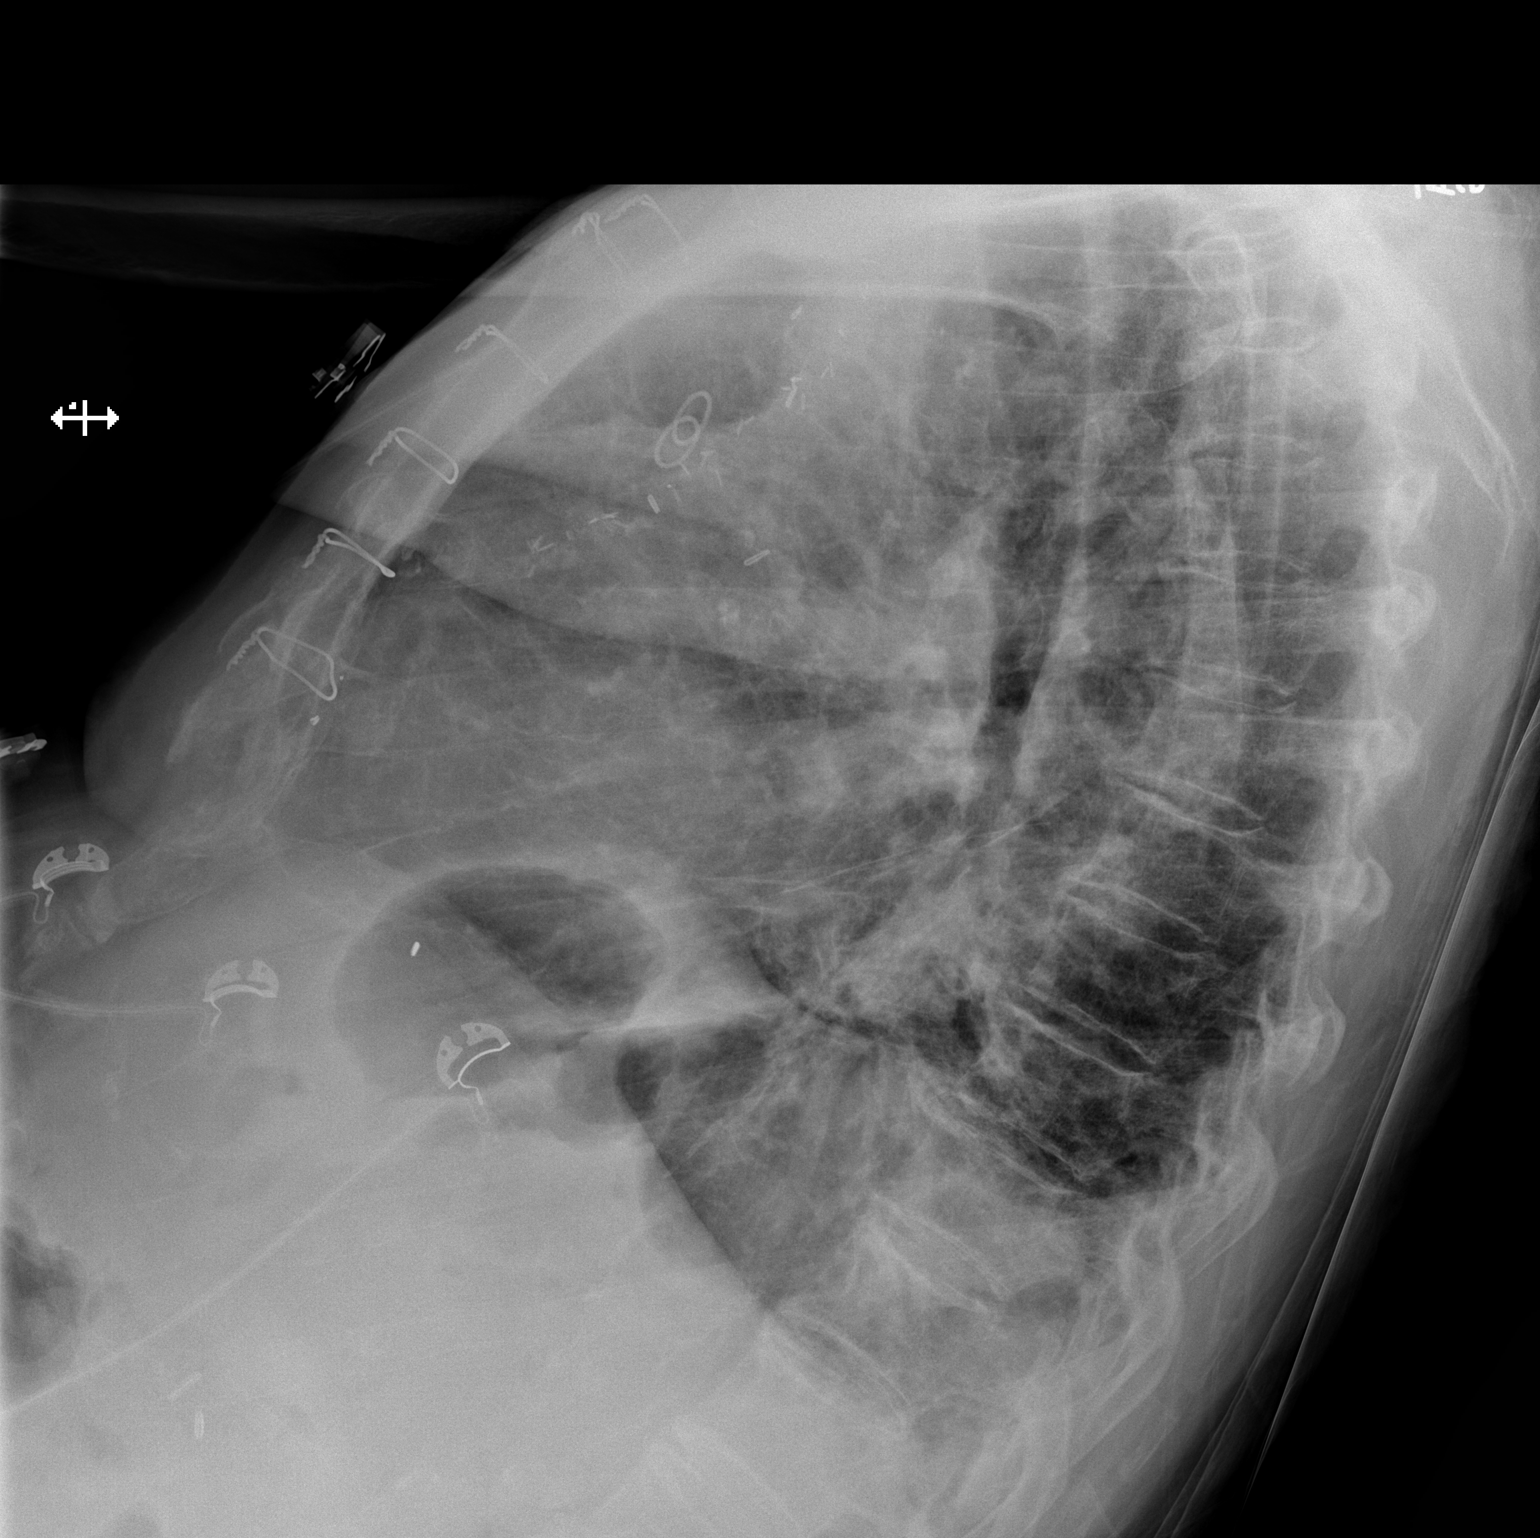

[x chest ap]
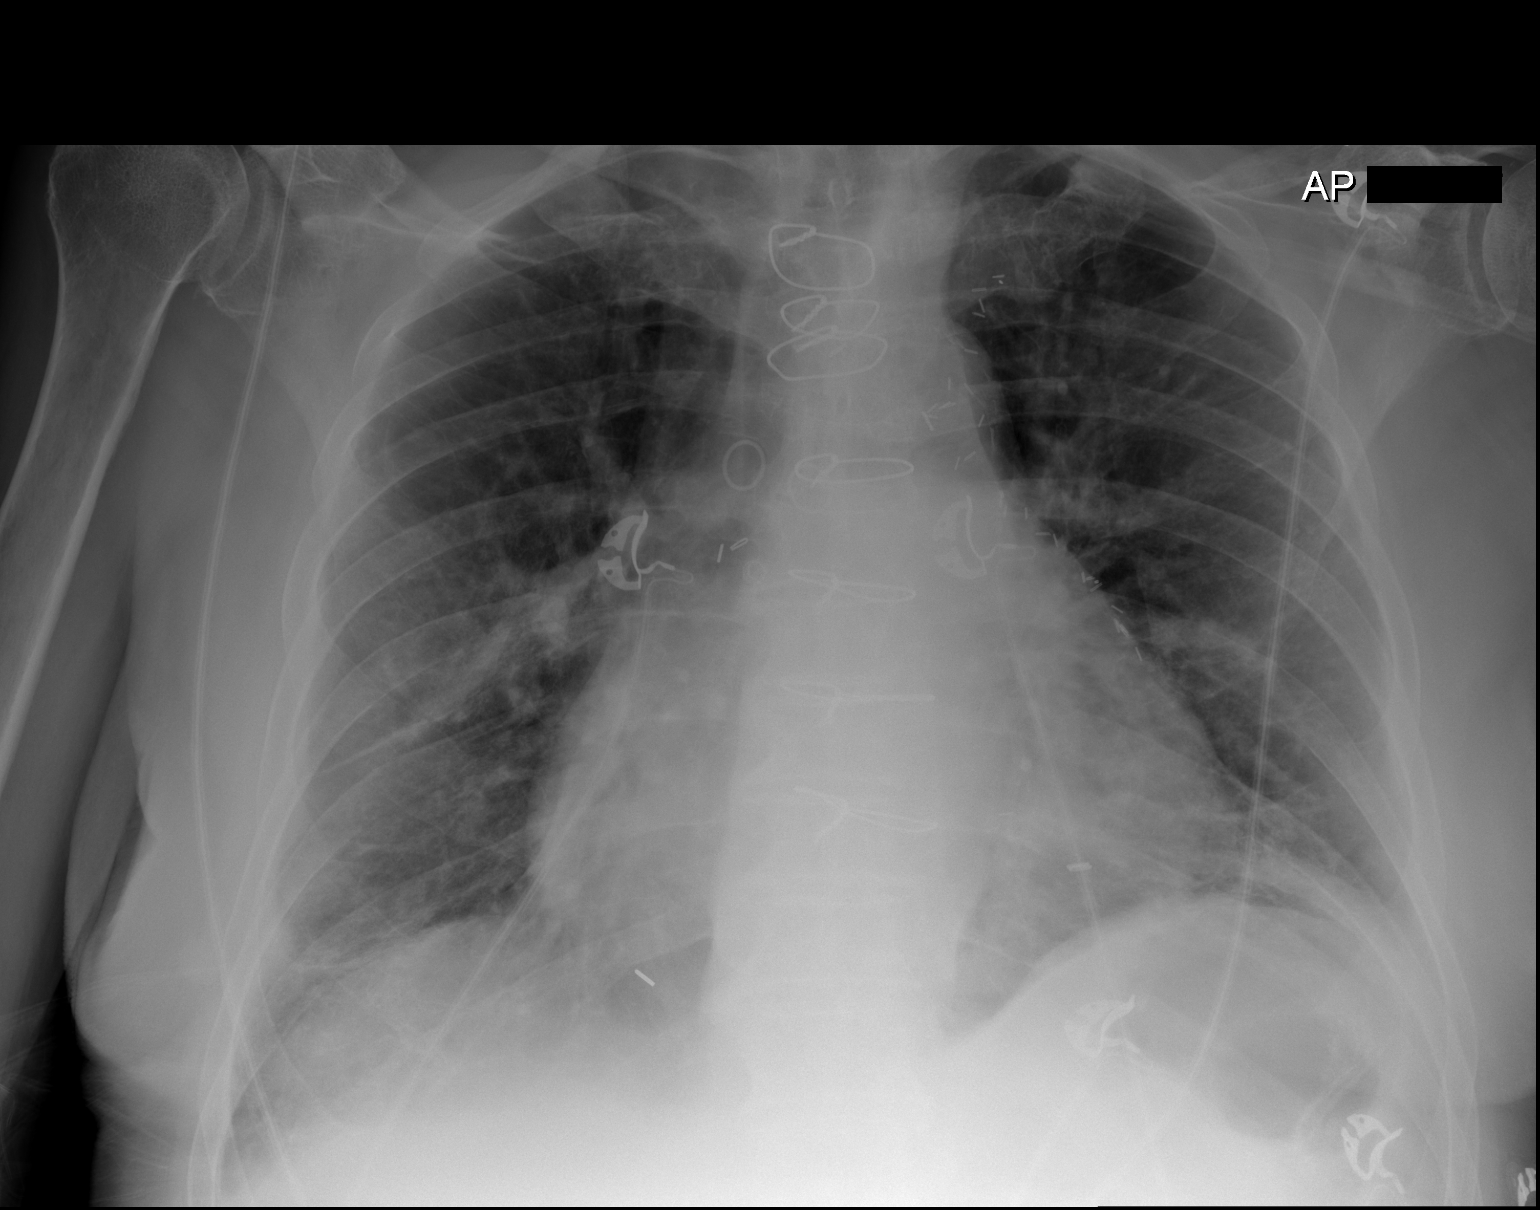

[2 of 2 positions shown; findings below may reference images not displayed]

FINDINGS: The lungs are well-aerated.  Streaky bilateral airspace
opacities, most prominent at the mid lung zones bilaterally and
left lung base, raise suspicion for pneumonia.  Mild underlying
vascular congestion is again noted.  There is no evidence of
significant pleural effusion or pneumothorax.

The heart is mildly enlarged; the patient is status post median
sternotomy, with evidence of prior CABG.  No acute osseous
abnormalities are seen.
IMPRESSION: 1.  Streaky bilateral airspace opacities, most prominent at the
midlung zones bilaterally and at the left lung base, raise
suspicion for pneumonia.
2.  Mild vascular congestion and mild cardiomegaly again noted.

## 2013-03-18 ENCOUNTER — Encounter (HOSPITAL_COMMUNITY): Payer: Self-pay

## 2013-03-18 ENCOUNTER — Emergency Department (HOSPITAL_COMMUNITY)
Admission: EM | Admit: 2013-03-18 | Discharge: 2013-03-18 | Disposition: A | Discharge status: Home / Self Care | Payer: Medicare Other | Attending: Emergency Medicine | Admitting: Emergency Medicine

## 2013-03-18 ENCOUNTER — Emergency Department (HOSPITAL_COMMUNITY): Payer: Medicare Other

## 2013-03-18 DIAGNOSIS — R011 Cardiac murmur, unspecified: Secondary | ICD-10-CM | POA: Insufficient documentation

## 2013-03-18 DIAGNOSIS — Z8701 Personal history of pneumonia (recurrent): Secondary | ICD-10-CM | POA: Insufficient documentation

## 2013-03-18 DIAGNOSIS — Z8619 Personal history of other infectious and parasitic diseases: Secondary | ICD-10-CM | POA: Insufficient documentation

## 2013-03-18 DIAGNOSIS — E785 Hyperlipidemia, unspecified: Secondary | ICD-10-CM | POA: Insufficient documentation

## 2013-03-18 DIAGNOSIS — I4891 Unspecified atrial fibrillation: Secondary | ICD-10-CM | POA: Insufficient documentation

## 2013-03-18 DIAGNOSIS — R5381 Other malaise: Secondary | ICD-10-CM | POA: Insufficient documentation

## 2013-03-18 DIAGNOSIS — Z951 Presence of aortocoronary bypass graft: Secondary | ICD-10-CM | POA: Insufficient documentation

## 2013-03-18 DIAGNOSIS — I2510 Atherosclerotic heart disease of native coronary artery without angina pectoris: Secondary | ICD-10-CM | POA: Insufficient documentation

## 2013-03-18 DIAGNOSIS — Z7901 Long term (current) use of anticoagulants: Secondary | ICD-10-CM | POA: Insufficient documentation

## 2013-03-18 DIAGNOSIS — E119 Type 2 diabetes mellitus without complications: Secondary | ICD-10-CM | POA: Insufficient documentation

## 2013-03-18 DIAGNOSIS — Z7982 Long term (current) use of aspirin: Secondary | ICD-10-CM | POA: Insufficient documentation

## 2013-03-18 DIAGNOSIS — Z87891 Personal history of nicotine dependence: Secondary | ICD-10-CM | POA: Insufficient documentation

## 2013-03-18 DIAGNOSIS — Z8551 Personal history of malignant neoplasm of bladder: Secondary | ICD-10-CM | POA: Insufficient documentation

## 2013-03-18 DIAGNOSIS — I10 Essential (primary) hypertension: Secondary | ICD-10-CM | POA: Insufficient documentation

## 2013-03-18 DIAGNOSIS — R531 Weakness: Secondary | ICD-10-CM

## 2013-03-18 DIAGNOSIS — I252 Old myocardial infarction: Secondary | ICD-10-CM | POA: Insufficient documentation

## 2013-03-18 DIAGNOSIS — I509 Heart failure, unspecified: Secondary | ICD-10-CM | POA: Insufficient documentation

## 2013-03-18 DIAGNOSIS — Z79899 Other long term (current) drug therapy: Secondary | ICD-10-CM | POA: Insufficient documentation

## 2013-03-18 LAB — GLUCOSE, CAPILLARY: Glucose-Capillary: 134 mg/dL — ABNORMAL HIGH (ref 70–99)

## 2013-03-18 LAB — BASIC METABOLIC PANEL
CO2: 27 mEq/L (ref 19–32)
Chloride: 105 mEq/L (ref 96–112)
GFR calc non Af Amer: 55 mL/min — ABNORMAL LOW (ref >90)
Glucose, Bld: 131 mg/dL — ABNORMAL HIGH (ref 70–99)
Potassium: 3.6 mEq/L (ref 3.5–5.1)
Sodium: 144 mEq/L (ref 135–145)

## 2013-03-18 LAB — CG4 I-STAT (LACTIC ACID): Lactic Acid, Venous: 1.53 mmol/L (ref 0.5–2.2)

## 2013-03-18 LAB — CBC WITH DIFFERENTIAL/PLATELET
Eosinophils Absolute: 0.1 10*3/uL (ref 0.0–0.7)
Hemoglobin: 12.9 g/dL — ABNORMAL LOW (ref 13.0–17.0)
Lymphocytes Relative: 32 % (ref 12–46)
Lymphs Abs: 1.5 10*3/uL (ref 0.7–4.0)
Neutro Abs: 2.4 10*3/uL (ref 1.7–7.7)
Neutrophils Relative %: 50 % (ref 43–77)
Platelets: 95 10*3/uL — ABNORMAL LOW (ref 150–400)
RBC: 3.82 MIL/uL — ABNORMAL LOW (ref 4.22–5.81)
WBC: 4.8 10*3/uL (ref 4.0–10.5)

## 2013-03-18 LAB — URINALYSIS, ROUTINE W REFLEX MICROSCOPIC
Hgb urine dipstick: NEGATIVE
Specific Gravity, Urine: 1.01 (ref 1.005–1.030)
Urobilinogen, UA: 0.2 mg/dL (ref 0.0–1.0)
pH: 7 (ref 5.0–8.0)

## 2013-03-18 LAB — PROTIME-INR
INR: 2.58 — ABNORMAL HIGH (ref 0.00–1.49)
Prothrombin Time: 26.8 seconds — ABNORMAL HIGH (ref 11.6–15.2)

## 2013-03-18 LAB — URINE MICROSCOPIC-ADD ON

## 2013-03-18 LAB — POCT I-STAT TROPONIN I: Troponin i, poc: 0.01 ng/mL (ref 0.00–0.08)

## 2013-03-18 NOTE — ED Notes (Signed)
Pt was at md office and getting flu shot when he had sudden weakness, pt with hx of same recently.

## 2013-03-18 NOTE — ED Notes (Signed)
Pt's CBG is 134mg /dl.RN Lequita Halt notified.

## 2013-03-18 NOTE — ED Notes (Signed)
Pt pulse ox fluctuated from 93-95% while ambulating, pt denied shortness of breath but c/o dizziness, pt ambulated with walker with stand by assistance

## 2013-03-18 NOTE — ED Provider Notes (Signed)
CSN: [id number]     Arrival date & time 03/18/13  1239 History   First MD Initiated Contact with Patient 03/18/13 1239     Chief Complaint  Patient presents with  . Weakness   (Consider location/radiation/quality/duration/timing/severity/associated sxs/prior Treatment) HPI Comments: Patient is a 79 y/o male with an extensive PMH including MI with CABG x 2, CAD, A Fib (on chronic coumadin), CHF, and DM who presents from PCP office for sensation of weakness. Patient states that onset occurred after receiving his flu shot today, though he endorses 2 prior episodes over the last 3 weeks. Patient states that prior episodes lasted <1 hour before spontaneously resolving with rest. He states that symptoms today have improved spontaneously since onset. Patient describes his symptoms as a "draining of energy" from his entire body. He denies any associated symptoms such as fevers, syncope, vision changes, tinnitus, difficulty speaking or swallowing, facial droop, URI symptoms, cough, CP, SOB, N/V, abdominal pain, dysuria, and numbness/tingling. He denies pain complaints. Last BM was yesterday evening which was normal in color and consistency. Patient lives alone at home and still currently drives; states his son lives next door to him.  Last Echo performed by Dr. Arbutus Leas in March 2014. Poor imaging quality, but EF at this time estimated at 45%. Unknown date of last cardiac catheterization.  The history is provided by the patient. No language interpreter was used.    Past Medical History  Diagnosis Date  . Myocardial infarct   . Diabetes mellitus   . Hypertension   . CHF (congestive heart failure)   . Coronary artery disease   . Dysrhythmia   . Clostridium difficile diarrhea     recurrent   . Cancer     Bladder cancer  . Hyperlipidemia   . Pneumonia 04/2011  . Paroxysmal atrial fibrillation   . Shortness of breath     only with my heart failure "   Past Surgical History  Procedure Laterality Date   . Coronary artery bypass graft    . Cholecystectomy    . Cardiac catheterization  10/10/2001  . Cystoscopy, turbt  05/09/2006, 02/19/2006, 04/19/2005, 04/21/2003  . Colonoscopy  01/31/2012    Procedure: COLONOSCOPY;  Surgeon: Iva Boop, MD;  Location: [facility];  Service: Endoscopy;  Laterality: N/A;  fecal transplant/cynthia snyder will prepare the sample   Family History  Problem Relation Age of Onset  . Stomach cancer Mother   . Coronary artery disease Father   . Diabetes Son   . Colon cancer Neg Hx    History  Substance Use Topics  . Smoking status: Former Smoker -- 1.00 packs/day    Types: Cigarettes    Quit date: 05/08/1974  . Smokeless tobacco: Never Used  . Alcohol Use: No     Comment: occasionally    Review of Systems  Constitutional: Negative for fever.  Respiratory: Negative for shortness of breath.   Cardiovascular: Negative for chest pain.  Gastrointestinal: Negative for nausea and vomiting.  Genitourinary: Negative for dysuria and hematuria.  Neurological: Positive for weakness. Negative for numbness.  All other systems reviewed and are negative.    Allergies  Morphine and related  Home Medications   Current Outpatient Rx  Name  Route  Sig  Dispense  Refill  . aspirin EC 81 MG tablet   Oral   Take 81 mg by mouth daily.         . bisoprolol (ZEBETA) 5 MG tablet   Oral   Take 5 mg  by mouth daily.         . diphenhydramine-acetaminophen (TYLENOL PM) 25-500 MG TABS   Oral   Take 1 tablet by mouth at bedtime as needed (sleep).         . ezetimibe (ZETIA) 10 MG tablet   Oral   Take 10 mg by mouth at bedtime.          . finasteride (PROSCAR) 5 MG tablet   Oral   Take 5 mg by mouth every morning.          . fish oil-omega-3 fatty acids 1000 MG capsule   Oral   Take 1 g by mouth every morning.          . furosemide (LASIX) 40 MG tablet   Oral   Take 20-40 mg by mouth 2 (two) times daily. Takes 40mg  in the morning and 20mg  in the  evening         . gemfibrozil (LOPID) 600 MG tablet   Oral   Take 600 mg by mouth 2 (two) times daily before a meal.          . glyBURIDE (DIABETA) 5 MG tablet   Oral   Take 2.5-5 mg by mouth daily as needed (high blood sugar).          Marland Kitchen ibuprofen (ADVIL,MOTRIN) 200 MG tablet   Oral   Take 200 mg by mouth every 6 (six) hours as needed for pain.         Marland Kitchen levothyroxine (SYNTHROID, LEVOTHROID) 25 MCG tablet   Oral   Take 25 mcg by mouth daily before breakfast.         . LORazepam (ATIVAN) 0.5 MG tablet   Oral   Take 0.5 mg by mouth at bedtime as needed. For insomnia.         . Multiple Vitamin (MULTIVITAMIN WITH MINERALS) TABS   Oral   Take 1 tablet by mouth daily.         . Multiple Vitamins-Minerals (ICAPS PO)   Oral   Take 1 capsule by mouth daily.          Marland Kitchen warfarin (COUMADIN) 5 MG tablet   Oral   Take 5-7.5 mg by mouth daily. 1.5 tabs on Sunday and Wednesday, 1 tab daily all other days.          BP 126/80  Pulse 100  Temp(Src) 97.5 F (36.4 C) (Oral)  Resp 20  Ht 6\' 1"  (1.854 m)  Wt 210 lb (95.255 kg)  BMI 27.71 kg/m2  SpO2 95%  Physical Exam  Nursing note and vitals reviewed. Constitutional: He is oriented to person, place, and time. He appears well-developed and well-nourished. No distress.  Patient presents and well-appearing, in no acute distress.  HENT:  Head: Normocephalic and atraumatic.  Mouth/Throat: Oropharynx is clear and moist. No oropharyngeal exudate.  Eyes: Conjunctivae and EOM are normal. Pupils are equal, round, and reactive to light. No scleral icterus.  Neck: Normal range of motion. Neck supple.  Cardiovascular: Normal rate, regular rhythm and intact distal pulses.   Murmur (I/VI SEM) heard. Pulses:      Dorsalis pedis pulses are 2+ on the right side, and 2+ on the left side.       Posterior tibial pulses are 2+ on the right side, and 2+ on the left side.  HR between 90-100 bpm  Pulmonary/Chest: Effort normal and  breath sounds normal. No respiratory distress. He has no wheezes. He has no rales.  Abdominal: Soft. He exhibits no distension. There is no tenderness.  Musculoskeletal: Normal range of motion.  Neurological: He is alert and oriented to person, place, and time.  Patient speaks in full goal oriented sentences. CN III-XII grossly intact. Patient has equal grip strength b/l with 5/5 strength against resistance in upper and lower extremities. No sensory or motor deficits appreciated. DTRs normal and symmetric. Patient moves extremities without ataxia.  Skin: Skin is warm and dry. No rash noted. He is not diaphoretic. No erythema. No pallor.  Psychiatric: He has a normal mood and affect. His behavior is normal.    ED Course  Procedures (including critical care time) Labs Review Labs Reviewed  CBC WITH DIFFERENTIAL - Abnormal; Notable for the following:    RBC 3.82 (*)    Hemoglobin 12.9 (*)    HCT 36.7 (*)    Platelets 95 (*)    Monocytes Relative 15 (*)    All other components within normal limits  BASIC METABOLIC PANEL - Abnormal; Notable for the following:    Glucose, Bld 131 (*)    BUN 25 (*)    GFR calc non Af Amer 55 (*)    GFR calc Af Amer 64 (*)    All other components within normal limits  URINALYSIS, ROUTINE W REFLEX MICROSCOPIC - Abnormal; Notable for the following:    Leukocytes, UA TRACE (*)    All other components within normal limits  PROTIME-INR - Abnormal; Notable for the following:    Prothrombin Time 26.8 (*)    INR 2.58 (*)    All other components within normal limits  GLUCOSE, CAPILLARY - Abnormal; Notable for the following:    Glucose-Capillary 134 (*)    All other components within normal limits  URINE CULTURE  URINE MICROSCOPIC-ADD ON  CG4 I-STAT (LACTIC ACID)  POCT I-STAT TROPONIN I    Date: 03/18/2013  Rate: 87  Rhythm: atrial fibrillation and atrial flutter  QRS Axis: normal  Intervals: normal  ST/T Wave abnormalities: nonspecific ST changes   Conduction Disutrbances:right bundle branch block  Narrative Interpretation: Atrial fib with flutter and RBBB; no STEMI  Old EKG Reviewed: unchanged from 12/24/2012 I have personally reviewed and interpreted this EKG  Imaging Review Dg Chest 2 View  03/18/2013   CLINICAL DATA:  Shortness of breath  EXAM: CHEST  2 VIEW  COMPARISON:  12/24/2012  FINDINGS: Cardiac shadow remains enlarged. Postsurgical changes are again noted. The left hemidiaphragm is again elevated. No focal infiltrate or sizable effusion is seen.  IMPRESSION: Chronic changes without acute abnormality.   Electronically Signed   By: Alcide Clever M.D.   On: 03/18/2013 13:59    MDM   1. Weakness     Patient is 79 y/o male who presents for sudden onset of weakness after receiving a flu shot at his PCP office. Patient endorses 2 prior episodes over the past 3 weeks which resolved spontaneously with rest. Patient is well and nontoxic appearing and hemodynamically stable. No focal neurologic deficits appreciated on exam. Symptoms improved, per patient, since onset.  I personally ambulated with the patient. Patient ambulates at baseline, with a walker, without unsteady gait or hypoxia. O2 sats stable at 94% with ambulation. Patient does endorse feeling "a little dizzy" with ambulation, but states he still feels stable when ambulating and is not unsteady. Work up is stable today without any significant findings. PT/INR therapeutic. There is no leukocytosis or electrolyte imbalance. Troponin and lactic acid WNL and EKG unchanged from prior. No  evidence of pneumonia on CXR and UA nonsuggestive of infection.  Have discussed these results with the patient and patient's son who verbalize understanding. Patient states that he would prefer to go home than be admitted for further monitoring. Believe that patient is hemodynamically stable and appropriate for discharge home with PCP outpatient follow up. Strict return precautions discussed and  provided; patient agreeable to plan with no unaddressed concerns. Patient seen also by Dr. Rhunette Croft who is in agreement with this work up, assessment, management plan, and patient's stability for d/c.    Antony Madura, PA-C 03/18/13 1607

## 2013-03-19 LAB — URINE CULTURE: Special Requests: NORMAL

## 2013-03-19 NOTE — ED Provider Notes (Signed)
Medical screening examination/treatment/procedure(s) were conducted as a shared visit with non-physician practitioner(s) and myself.  I personally evaluated the patient during the encounter.  Pt comes in with cc of malaise. States that he just feels tired. Extensive ROS is negative - Pt denies nausea, emesis, fevers, chills, chest pains, shortness of breath, headaches, abdominal pain, near syncope/syncope, uti like symptoms, blood loss. Appetite has been normal. All labs are WNL Pt has afib hx, and his HR is the 100s. Pt's son at bedside, and after careful discussion with the family, and patient - they prefer being discharged, and having pt live with family for now. PCP f/u this week.    Derwood Kaplan, MD 03/19/13 [phone]

## 2013-04-17 ENCOUNTER — Other Ambulatory Visit: Payer: Self-pay

## 2013-05-13 ENCOUNTER — Emergency Department (HOSPITAL_COMMUNITY): Payer: Medicare Other

## 2013-05-13 ENCOUNTER — Inpatient Hospital Stay (HOSPITAL_COMMUNITY)
Admission: EM | Admit: 2013-05-13 | Discharge: 2013-05-17 | DRG: 280 | Disposition: A | Discharge status: Home / Self Care | Payer: Medicare Other | Attending: Internal Medicine | Admitting: Internal Medicine

## 2013-05-13 ENCOUNTER — Other Ambulatory Visit: Payer: Self-pay

## 2013-05-13 ENCOUNTER — Encounter (HOSPITAL_COMMUNITY): Payer: Self-pay | Admitting: Emergency Medicine

## 2013-05-13 ENCOUNTER — Inpatient Hospital Stay (HOSPITAL_COMMUNITY): Payer: Medicare Other

## 2013-05-13 DIAGNOSIS — IMO0002 Reserved for concepts with insufficient information to code with codable children: Secondary | ICD-10-CM

## 2013-05-13 DIAGNOSIS — Z951 Presence of aortocoronary bypass graft: Secondary | ICD-10-CM

## 2013-05-13 DIAGNOSIS — I5033 Acute on chronic diastolic (congestive) heart failure: Principal | ICD-10-CM

## 2013-05-13 DIAGNOSIS — R0600 Dyspnea, unspecified: Secondary | ICD-10-CM

## 2013-05-13 DIAGNOSIS — D649 Anemia, unspecified: Secondary | ICD-10-CM

## 2013-05-13 DIAGNOSIS — Z791 Long term (current) use of non-steroidal anti-inflammatories (NSAID): Secondary | ICD-10-CM

## 2013-05-13 DIAGNOSIS — I480 Paroxysmal atrial fibrillation: Secondary | ICD-10-CM | POA: Diagnosis present

## 2013-05-13 DIAGNOSIS — I248 Other forms of acute ischemic heart disease: Secondary | ICD-10-CM | POA: Diagnosis present

## 2013-05-13 DIAGNOSIS — Z87891 Personal history of nicotine dependence: Secondary | ICD-10-CM

## 2013-05-13 DIAGNOSIS — L02818 Cutaneous abscess of other sites: Secondary | ICD-10-CM | POA: Diagnosis present

## 2013-05-13 DIAGNOSIS — R0602 Shortness of breath: Secondary | ICD-10-CM

## 2013-05-13 DIAGNOSIS — E876 Hypokalemia: Secondary | ICD-10-CM | POA: Diagnosis present

## 2013-05-13 DIAGNOSIS — Z79899 Other long term (current) drug therapy: Secondary | ICD-10-CM

## 2013-05-13 DIAGNOSIS — R531 Weakness: Secondary | ICD-10-CM

## 2013-05-13 DIAGNOSIS — E119 Type 2 diabetes mellitus without complications: Secondary | ICD-10-CM

## 2013-05-13 DIAGNOSIS — L0390 Cellulitis, unspecified: Secondary | ICD-10-CM

## 2013-05-13 DIAGNOSIS — A419 Sepsis, unspecified organism: Secondary | ICD-10-CM | POA: Diagnosis present

## 2013-05-13 DIAGNOSIS — K921 Melena: Secondary | ICD-10-CM | POA: Diagnosis present

## 2013-05-13 DIAGNOSIS — E1165 Type 2 diabetes mellitus with hyperglycemia: Secondary | ICD-10-CM

## 2013-05-13 DIAGNOSIS — I252 Old myocardial infarction: Secondary | ICD-10-CM | POA: Diagnosis present

## 2013-05-13 DIAGNOSIS — I129 Hypertensive chronic kidney disease with stage 1 through stage 4 chronic kidney disease, or unspecified chronic kidney disease: Secondary | ICD-10-CM | POA: Diagnosis present

## 2013-05-13 DIAGNOSIS — L02419 Cutaneous abscess of limb, unspecified: Secondary | ICD-10-CM

## 2013-05-13 DIAGNOSIS — D696 Thrombocytopenia, unspecified: Secondary | ICD-10-CM

## 2013-05-13 DIAGNOSIS — Z7901 Long term (current) use of anticoagulants: Secondary | ICD-10-CM

## 2013-05-13 DIAGNOSIS — Z833 Family history of diabetes mellitus: Secondary | ICD-10-CM

## 2013-05-13 DIAGNOSIS — N189 Chronic kidney disease, unspecified: Secondary | ICD-10-CM | POA: Diagnosis present

## 2013-05-13 DIAGNOSIS — L03115 Cellulitis of right lower limb: Secondary | ICD-10-CM

## 2013-05-13 DIAGNOSIS — E785 Hyperlipidemia, unspecified: Secondary | ICD-10-CM | POA: Diagnosis present

## 2013-05-13 DIAGNOSIS — I959 Hypotension, unspecified: Secondary | ICD-10-CM

## 2013-05-13 DIAGNOSIS — I2510 Atherosclerotic heart disease of native coronary artery without angina pectoris: Secondary | ICD-10-CM | POA: Diagnosis present

## 2013-05-13 DIAGNOSIS — I059 Rheumatic mitral valve disease, unspecified: Secondary | ICD-10-CM

## 2013-05-13 DIAGNOSIS — Z66 Do not resuscitate: Secondary | ICD-10-CM | POA: Diagnosis present

## 2013-05-13 DIAGNOSIS — Z8249 Family history of ischemic heart disease and other diseases of the circulatory system: Secondary | ICD-10-CM

## 2013-05-13 DIAGNOSIS — I4891 Unspecified atrial fibrillation: Secondary | ICD-10-CM | POA: Diagnosis present

## 2013-05-13 DIAGNOSIS — I5043 Acute on chronic combined systolic (congestive) and diastolic (congestive) heart failure: Secondary | ICD-10-CM

## 2013-05-13 DIAGNOSIS — Z885 Allergy status to narcotic agent status: Secondary | ICD-10-CM

## 2013-05-13 DIAGNOSIS — I509 Heart failure, unspecified: Secondary | ICD-10-CM

## 2013-05-13 DIAGNOSIS — R6510 Systemic inflammatory response syndrome (SIRS) of non-infectious origin without acute organ dysfunction: Secondary | ICD-10-CM

## 2013-05-13 DIAGNOSIS — I2489 Other forms of acute ischemic heart disease: Secondary | ICD-10-CM | POA: Diagnosis present

## 2013-05-13 DIAGNOSIS — I10 Essential (primary) hypertension: Secondary | ICD-10-CM

## 2013-05-13 DIAGNOSIS — I214 Non-ST elevation (NSTEMI) myocardial infarction: Secondary | ICD-10-CM

## 2013-05-13 DIAGNOSIS — Z7982 Long term (current) use of aspirin: Secondary | ICD-10-CM

## 2013-05-13 DIAGNOSIS — I4510 Unspecified right bundle-branch block: Secondary | ICD-10-CM | POA: Diagnosis present

## 2013-05-13 HISTORY — DX: Chronic kidney disease, unspecified: N18.9

## 2013-05-13 LAB — CBC
HCT: 32.8 % — ABNORMAL LOW (ref 39.0–52.0)
Hemoglobin: 10.9 g/dL — ABNORMAL LOW (ref 13.0–17.0)
Hemoglobin: 11.2 g/dL — ABNORMAL LOW (ref 13.0–17.0)
MCH: 32.7 pg (ref 26.0–34.0)
MCHC: 33.1 g/dL (ref 30.0–36.0)
MCV: 98.8 fL (ref 78.0–100.0)
Platelets: 71 10*3/uL — ABNORMAL LOW (ref 150–400)
RBC: 3.32 MIL/uL — ABNORMAL LOW (ref 4.22–5.81)
RDW: 14.2 % (ref 11.5–15.5)
RDW: 14.3 % (ref 11.5–15.5)
WBC: 12 10*3/uL — ABNORMAL HIGH (ref 4.0–10.5)

## 2013-05-13 LAB — URINALYSIS, ROUTINE W REFLEX MICROSCOPIC
Bilirubin Urine: NEGATIVE
Nitrite: NEGATIVE
Specific Gravity, Urine: 1.019 (ref 1.005–1.030)
pH: 7.5 (ref 5.0–8.0)

## 2013-05-13 LAB — CBC WITH DIFFERENTIAL/PLATELET
Basophils Absolute: 0 10*3/uL (ref 0.0–0.1)
Basophils Relative: 0 % (ref 0–1)
Eosinophils Relative: 0 % (ref 0–5)
HCT: 35.6 % — ABNORMAL LOW (ref 39.0–52.0)
Hemoglobin: 12.2 g/dL — ABNORMAL LOW (ref 13.0–17.0)
MCH: 33.3 pg (ref 26.0–34.0)
MCHC: 34.3 g/dL (ref 30.0–36.0)
MCV: 97.3 fL (ref 78.0–100.0)
Monocytes Absolute: 1.5 10*3/uL — ABNORMAL HIGH (ref 0.1–1.0)
Neutro Abs: 11.9 10*3/uL — ABNORMAL HIGH (ref 1.7–7.7)
RDW: 13.9 % (ref 11.5–15.5)

## 2013-05-13 LAB — COMPREHENSIVE METABOLIC PANEL
AST: 22 U/L (ref 0–37)
Albumin: 3.5 g/dL (ref 3.5–5.2)
Albumin: 3 g/dL — ABNORMAL LOW (ref 3.5–5.2)
Alkaline Phosphatase: 41 U/L (ref 39–117)
Alkaline Phosphatase: 38 U/L — ABNORMAL LOW (ref 39–117)
BUN: 21 mg/dL (ref 6–23)
Calcium: 8.9 mg/dL (ref 8.4–10.5)
Creatinine, Ser: 1.16 mg/dL (ref 0.50–1.35)
Creatinine, Ser: 1.28 mg/dL (ref 0.50–1.35)
GFR calc Af Amer: 58 mL/min — ABNORMAL LOW (ref >90)
Glucose, Bld: 197 mg/dL — ABNORMAL HIGH (ref 70–99)
Potassium: 3.8 mEq/L (ref 3.5–5.1)
Sodium: 138 mEq/L (ref 135–145)
Total Protein: 6.1 g/dL (ref 6.0–8.3)

## 2013-05-13 LAB — BASIC METABOLIC PANEL
Calcium: 8.2 mg/dL — ABNORMAL LOW (ref 8.4–10.5)
Creatinine, Ser: 1.22 mg/dL (ref 0.50–1.35)
GFR calc Af Amer: 54 mL/min — ABNORMAL LOW (ref >90)
GFR calc non Af Amer: 47 mL/min — ABNORMAL LOW (ref >90)
Glucose, Bld: 157 mg/dL — ABNORMAL HIGH (ref 70–99)
Potassium: 3.8 mEq/L (ref 3.5–5.1)
Sodium: 139 mEq/L (ref 135–145)

## 2013-05-13 LAB — MAGNESIUM: Magnesium: 2.1 mg/dL (ref 1.5–2.5)

## 2013-05-13 LAB — GLUCOSE, CAPILLARY
Glucose-Capillary: 133 mg/dL — ABNORMAL HIGH (ref 70–99)
Glucose-Capillary: 140 mg/dL — ABNORMAL HIGH (ref 70–99)
Glucose-Capillary: 144 mg/dL — ABNORMAL HIGH (ref 70–99)
Glucose-Capillary: 200 mg/dL — ABNORMAL HIGH (ref 70–99)

## 2013-05-13 LAB — PRO B NATRIURETIC PEPTIDE: Pro B Natriuretic peptide (BNP): 1389 pg/mL — ABNORMAL HIGH (ref 0–450)

## 2013-05-13 LAB — ABO/RH: ABO/RH(D): A POS

## 2013-05-13 LAB — PROTIME-INR
INR: 1.99 — ABNORMAL HIGH (ref 0.00–1.49)
Prothrombin Time: 22 seconds — ABNORMAL HIGH (ref 11.6–15.2)

## 2013-05-13 LAB — HEMOGLOBIN A1C: Hgb A1c MFr Bld: 7.4 % — ABNORMAL HIGH (ref <5.7)

## 2013-05-13 LAB — MRSA PCR SCREENING: MRSA by PCR: POSITIVE — AB

## 2013-05-13 LAB — URINE MICROSCOPIC-ADD ON

## 2013-05-13 LAB — PROCALCITONIN: Procalcitonin: <0.1 ng/mL

## 2013-05-13 LAB — PHOSPHORUS: Phosphorus: 3.5 mg/dL (ref 2.3–4.6)

## 2013-05-13 MED ORDER — METOPROLOL TARTRATE 12.5 MG HALF TABLET
12.5000 mg | ORAL_TABLET | Freq: Two times a day (BID) | ORAL | Status: DC
  Administered 2013-05-13 – 2013-05-16 (×7): 12.5 mg via ORAL
  Filled 2013-05-13 (×10): qty 1

## 2013-05-13 MED ORDER — DOCUSATE SODIUM 100 MG PO CAPS
100.0000 mg | ORAL_CAPSULE | Freq: Two times a day (BID) | ORAL | Status: DC
  Administered 2013-05-13 – 2013-05-17 (×9): 100 mg via ORAL
  Filled 2013-05-13 (×10): qty 1

## 2013-05-13 MED ORDER — PIPERACILLIN-TAZOBACTAM 3.375 G IVPB
3.3750 g | Freq: Once | INTRAVENOUS | Status: AC
  Administered 2013-05-13: 3.375 g via INTRAVENOUS
  Filled 2013-05-13: qty 50

## 2013-05-13 MED ORDER — LEVOTHYROXINE SODIUM 50 MCG PO TABS
50.0000 ug | ORAL_TABLET | Freq: Every day | ORAL | Status: DC
  Administered 2013-05-13 – 2013-05-17 (×5): 50 ug via ORAL
  Filled 2013-05-13 (×7): qty 1

## 2013-05-13 MED ORDER — SODIUM CHLORIDE 0.9 % IV SOLN
INTRAVENOUS | Status: DC
  Administered 2013-05-13: 14:00:00 50 mL/h via INTRAVENOUS

## 2013-05-13 MED ORDER — MIRTAZAPINE 15 MG PO TABS
15.0000 mg | ORAL_TABLET | Freq: Every day | ORAL | Status: DC
  Administered 2013-05-13 – 2013-05-16 (×4): 15 mg via ORAL
  Filled 2013-05-13 (×5): qty 1

## 2013-05-13 MED ORDER — ONDANSETRON HCL 4 MG/2ML IJ SOLN: 4.0000 mg | Freq: Four times a day (QID) | INTRAMUSCULAR | Status: DC | PRN

## 2013-05-13 MED ORDER — SODIUM CHLORIDE 0.9 % IV BOLUS (SEPSIS)
500.0000 mL | Freq: Once | INTRAVENOUS | Status: AC
  Administered 2013-05-13: 05:00:00 500 mL via INTRAVENOUS

## 2013-05-13 MED ORDER — PANTOPRAZOLE SODIUM 40 MG IV SOLR
40.0000 mg | Freq: Two times a day (BID) | INTRAVENOUS | Status: DC
  Administered 2013-05-13 – 2013-05-16 (×8): 40 mg via INTRAVENOUS
  Filled 2013-05-13 (×10): qty 40

## 2013-05-13 MED ORDER — ACETAMINOPHEN 325 MG PO TABS: 650.0000 mg | ORAL_TABLET | Freq: Four times a day (QID) | ORAL | Status: DC | PRN

## 2013-05-13 MED ORDER — SODIUM CHLORIDE 0.9 % IV BOLUS (SEPSIS)
1000.0000 mL | Freq: Once | INTRAVENOUS | Status: AC
  Administered 2013-05-13: 1000 mL via INTRAVENOUS

## 2013-05-13 MED ORDER — SODIUM CHLORIDE 0.9 % IV SOLN
Freq: Once | INTRAVENOUS | Status: AC
  Administered 2013-05-13: 02:00:00 via INTRAVENOUS

## 2013-05-13 MED ORDER — HYDROCODONE-ACETAMINOPHEN 5-325 MG PO TABS: 1.0000 | ORAL_TABLET | ORAL | Status: DC | PRN

## 2013-05-13 MED ORDER — ONDANSETRON HCL 4 MG PO TABS: 4.0000 mg | ORAL_TABLET | Freq: Four times a day (QID) | ORAL | Status: DC | PRN

## 2013-05-13 MED ORDER — SODIUM CHLORIDE 0.9 % IJ SOLN
3.0000 mL | Freq: Two times a day (BID) | INTRAMUSCULAR | Status: DC
  Administered 2013-05-14 – 2013-05-16 (×4): 3 mL via INTRAVENOUS

## 2013-05-13 MED ORDER — VANCOMYCIN HCL 10 G IV SOLR
1250.0000 mg | INTRAVENOUS | Status: DC
  Administered 2013-05-14 – 2013-05-15 (×2): 1250 mg via INTRAVENOUS
  Filled 2013-05-13 (×3): qty 1250

## 2013-05-13 MED ORDER — CHLORHEXIDINE GLUCONATE CLOTH 2 % EX PADS: 6.0000 | MEDICATED_PAD | Freq: Every day | CUTANEOUS | Status: DC

## 2013-05-13 MED ORDER — INSULIN ASPART 100 UNIT/ML ~~LOC~~ SOLN
0.0000 [IU] | SUBCUTANEOUS | Status: DC
  Administered 2013-05-13: 09:00:00 1 [IU] via SUBCUTANEOUS
  Administered 2013-05-13: 2 [IU] via SUBCUTANEOUS
  Administered 2013-05-13: 12:00:00 1 [IU] via SUBCUTANEOUS
  Administered 2013-05-13: 20:00:00 2 [IU] via SUBCUTANEOUS
  Administered 2013-05-13: 16:00:00 1 [IU] via SUBCUTANEOUS
  Administered 2013-05-14 (×2): 2 [IU] via SUBCUTANEOUS
  Administered 2013-05-14: 09:00:00 1 [IU] via SUBCUTANEOUS
  Administered 2013-05-14: 3 [IU] via SUBCUTANEOUS
  Administered 2013-05-14: 18:00:00 1 [IU] via SUBCUTANEOUS
  Administered 2013-05-15 (×2): 2 [IU] via SUBCUTANEOUS
  Administered 2013-05-15: 3 [IU] via SUBCUTANEOUS
  Administered 2013-05-15: 2 [IU] via SUBCUTANEOUS
  Administered 2013-05-15: 18:00:00 1 [IU] via SUBCUTANEOUS
  Administered 2013-05-15 – 2013-05-16 (×3): 2 [IU] via SUBCUTANEOUS
  Administered 2013-05-16: 08:00:00 1 [IU] via SUBCUTANEOUS
  Administered 2013-05-16: 12:00:00 2 [IU] via SUBCUTANEOUS

## 2013-05-13 MED ORDER — VANCOMYCIN HCL IN DEXTROSE 1-5 GM/200ML-% IV SOLN
1000.0000 mg | Freq: Once | INTRAVENOUS | Status: AC
  Administered 2013-05-13: 1000 mg via INTRAVENOUS
  Filled 2013-05-13: qty 200

## 2013-05-13 MED ORDER — PIPERACILLIN-TAZOBACTAM 3.375 G IVPB
3.3750 g | Freq: Three times a day (TID) | INTRAVENOUS | Status: DC
  Administered 2013-05-13 – 2013-05-15 (×8): 3.375 g via INTRAVENOUS
  Filled 2013-05-13 (×11): qty 50

## 2013-05-13 MED ORDER — ACETAMINOPHEN 650 MG RE SUPP: 650.0000 mg | Freq: Four times a day (QID) | RECTAL | Status: DC | PRN

## 2013-05-13 MED ORDER — TAMSULOSIN HCL 0.4 MG PO CAPS
0.4000 mg | ORAL_CAPSULE | Freq: Every day | ORAL | Status: DC
  Administered 2013-05-13 – 2013-05-17 (×5): 0.4 mg via ORAL
  Filled 2013-05-13 (×5): qty 1

## 2013-05-13 MED ORDER — LORAZEPAM 0.5 MG PO TABS
0.5000 mg | ORAL_TABLET | Freq: Every evening | ORAL | Status: DC | PRN
  Administered 2013-05-13 – 2013-05-16 (×4): 0.5 mg via ORAL
  Filled 2013-05-13 (×4): qty 1

## 2013-05-13 MED ORDER — ACETAMINOPHEN 325 MG PO TABS
650.0000 mg | ORAL_TABLET | Freq: Once | ORAL | Status: AC
  Administered 2013-05-13: 650 mg via ORAL
  Filled 2013-05-13: qty 2

## 2013-05-13 MED ORDER — CHLORHEXIDINE GLUCONATE CLOTH 2 % EX PADS
6.0000 | MEDICATED_PAD | Freq: Every day | CUTANEOUS | Status: DC
  Administered 2013-05-14 – 2013-05-17 (×4): 6 via TOPICAL

## 2013-05-13 MED ORDER — EZETIMIBE 10 MG PO TABS
10.0000 mg | ORAL_TABLET | Freq: Every day | ORAL | Status: DC
  Administered 2013-05-13 – 2013-05-16 (×4): 10 mg via ORAL
  Filled 2013-05-13 (×5): qty 1

## 2013-05-13 MED ORDER — GEMFIBROZIL 600 MG PO TABS
600.0000 mg | ORAL_TABLET | Freq: Two times a day (BID) | ORAL | Status: DC
  Administered 2013-05-13 – 2013-05-17 (×9): 600 mg via ORAL
  Filled 2013-05-13 (×12): qty 1

## 2013-05-13 MED ORDER — MUPIROCIN 2 % EX OINT
1.0000 "application " | TOPICAL_OINTMENT | Freq: Two times a day (BID) | CUTANEOUS | Status: DC
  Administered 2013-05-13 – 2013-05-17 (×9): 1 via NASAL
  Filled 2013-05-13 (×2): qty 22

## 2013-05-13 NOTE — Progress Notes (Signed)
  Echocardiogram 2D Echocardiogram has been performed.  David Davidson 05/13/2013, 10:08 AM

## 2013-05-13 NOTE — Progress Notes (Signed)
ANTIBIOTIC CONSULT NOTE - INITIAL  Pharmacy Consult for Vancomycin Indication: Cellulitis/SIRS  Allergies  Allergen Reactions  . Morphine And Related Other (See Comments)    Reaction unknown    Patient Measurements: Height: 6\' 1"  (185.4 cm) Weight: 207 lb 14.3 oz (94.3 kg) IBW/kg (Calculated) : 79.9 Adjusted Body Weight:   Vital Signs: Temp: 98.1 F (36.7 C) (12/02 0451) Temp src: Oral (12/02 0451) BP: 77/45 mmHg (12/02 0451) Pulse Rate: 79 (12/02 0451) Intake/Output from previous day: 12/01 0701 - 12/02 0700 In: 2000 [I.V.:2000] Out: -  Intake/Output from this shift: Total I/O In: 2000 [I.V.:2000] Out: -   Labs:  Recent Labs  05/13/13 0055  WBC 14.3*  HGB 12.2*  PLT 85*  CREATININE 1.16   Estimated Creatinine Clearance: 38.3 ml/min (by C-G formula based on Cr of 1.16). No results found for this basename: VANCOTROUGH, VANCOPEAK, VANCORANDOM, GENTTROUGH, GENTPEAK, GENTRANDOM, TOBRATROUGH, TOBRAPEAK, TOBRARND, AMIKACINPEAK, AMIKACINTROU, AMIKACIN,  in the last 72 hours   Microbiology: No results found for this or any previous visit (from the past 720 hour(s)).  Medical History: Past Medical History  Diagnosis Date  . Myocardial infarct   . Diabetes mellitus   . Hypertension   . CHF (congestive heart failure)   . Coronary artery disease   . Clostridium difficile diarrhea     recurrent   . Cancer     Bladder cancer  . Hyperlipidemia   . Pneumonia 04/2011  . Shortness of breath     only with my heart failure "  . Chronic kidney disease   . Dysrhythmia   . Paroxysmal atrial fibrillation     Medications:  Anti-infectives   Start     Dose/Rate Route Frequency Ordered Stop   05/13/13 0800  piperacillin-tazobactam (ZOSYN) IVPB 3.375 g     3.375 g 12.5 mL/hr over 240 Minutes Intravenous 3 times per day 05/13/13 0418     05/13/13 0200  vancomycin (VANCOCIN) IVPB 1000 mg/200 mL premix     1,000 mg 200 mL/hr over 60 Minutes Intravenous  Once 05/13/13  0153 05/13/13 0358   05/13/13 0200  piperacillin-tazobactam (ZOSYN) IVPB 3.375 g     3.375 g 100 mL/hr over 30 Minutes Intravenous  Once 05/13/13 0153 05/13/13 0219     Assessment: Patient with cellulitis and now SIRS.  First dose of antibiotics already given.  Goal of Therapy:  Vancomycin trough level 15-20 mcg/ml (due to SIRS component)  Plan:  Measure antibiotic drug levels at steady state Follow up culture results Vancomycin 1250mg  iv q24hr  Darlina Guys, Jacquenette Shone Crowford 05/13/2013,5:24 AM

## 2013-05-13 NOTE — Progress Notes (Signed)
    CARE MANAGEMENT ED NOTE 05/13/2013  Patient:  David Davidson, David Davidson   Account Number:  [id number]  Date Initiated:  05/13/2013  Documentation initiated by:  Melinda Crutch  Subjective/Objective Assessment:   79yr old male came into the ED c/o SOB, increasing weakness and fevers. diag: systemic inflammatory response syndrome per medical report due to cellulitis. Patient was administered IV ABT, NS, blood cultures pending.     Subjective/Objective Assessment Detail:   Member placed on 2 liters oxygen o2 sat 97% greater.  Labs abnormal.WBC 14.3. Patient admitted Inpatient to [facility].     Action/Plan:   UR completed   Action/Plan Detail:   Anticipated DC Date:  05/16/2013     Status Recommendation to Physician:   Result of Recommendation:    Other ED Services  Consult Working Plan    DC Planning Services  Other    Choice offered to / List presented to:            Status of service:  Completed, signed off  ED Comments:   ED Comments Detail:

## 2013-05-13 NOTE — ED Notes (Signed)
Brought in by EMS from home with c/o weakness.  Per EMS, pt reported that he has been feeling weak for the past month, weakness is getting progressively worse that he becomes short of breath with little exertion; pt's O2 sat was 92% on room air on EMS' arrival at the pt's home---- pt was given O2 at 2LPM via [place] d/t shortness of breath.  Pt also reports "black, tarry stool"; pt denies abdominal pain, denies nausea or vomiting or diarrhea.

## 2013-05-13 NOTE — H&P (Signed)
PCP: [name], MD  Lb gi  Chief Complaint:  Shortness of breath  HPI: David Davidson is a 79 y.o. male   has a past medical history of Myocardial infarct; Diabetes mellitus; Hypertension; CHF (congestive heart failure); Coronary artery disease; Clostridium difficile diarrhea; Cancer; Hyperlipidemia; Pneumonia (04/2011); Shortness of breath; Chronic kidney disease; Dysrhythmia; and Paroxysmal atrial fibrillation.   Presented with  Pateint states that he has slowly began to feel progressively short of breath and at times feels like he is about to pass out with exertion. Today he felt he had hard time breathing and could not walk without assistance. HE was not sure what was wrong but presented to ER. He was found to have mild cellulitis. He was febrile up to 102.1 and at first was noted to be hypotensive with blood pressure down to 86/51. He was given IVF x 1 L and started on vancomycin and zosyn. His blood pressure has improved up to 101/55. He states that he is feeling much better now.  He reports having some black tarry stools for the past 1 week. Patient reports hx of C.diff in the past but denies any diarrhea at this point.  Patient is on coumadin for hx of Paroxysmal A .fib Hospitalist called for admission   Review of Systems:    Pertinent positives include:Fevers, chills, fatigue, melena, shortness of breath at rest.  dyspnea on exertion,   Constitutional:  No weight loss, night sweats, weight loss  HEENT:  No headaches, Difficulty swallowing,Tooth/dental problems,Sore throat,  No sneezing, itching, ear ache, nasal congestion, post nasal drip,  Cardio-vascular:  No chest pain, Orthopnea, PND, anasarca, dizziness, palpitations.no Bilateral lower extremity swelling  GI:  No heartburn, indigestion, abdominal pain, nausea, vomiting, diarrhea, change in bowel habits, loss of appetite,  blood in stool, hematemesis Resp:  No excess mucus, no productive cough, No non-productive  cough, No coughing up of blood.No change in color of mucus.No wheezing. Skin:  no rash or lesions. No jaundice GU:  no dysuria, change in color of urine, no urgency or frequency. No straining to urinate.  No flank pain.  Musculoskeletal:  No joint pain or no joint swelling. No decreased range of motion. No back pain.  Psych:  No change in mood or affect. No depression or anxiety. No memory loss.  Neuro: no localizing neurological complaints, no tingling, no weakness, no double vision, no gait abnormality, no slurred speech, no confusion  Otherwise ROS are negative except for above, 10 systems were reviewed  Past Medical History: Past Medical History  Diagnosis Date  . Myocardial infarct   . Diabetes mellitus   . Hypertension   . CHF (congestive heart failure)   . Coronary artery disease   . Clostridium difficile diarrhea     recurrent   . Cancer     Bladder cancer  . Hyperlipidemia   . Pneumonia 04/2011  . Shortness of breath     only with my heart failure "  . Chronic kidney disease   . Dysrhythmia   . Paroxysmal atrial fibrillation    Past Surgical History  Procedure Laterality Date  . Coronary artery bypass graft    . Cholecystectomy    . Cardiac catheterization  10/10/2001  . Cystoscopy, turbt  05/09/2006, 02/19/2006, 04/19/2005, 04/21/2003  . Colonoscopy  01/31/2012    Procedure: COLONOSCOPY;  Surgeon: Iva Boop, MD;  Location: [facility];  Service: Endoscopy;  Laterality: N/A;  fecal transplant/cynthia snyder will prepare the sample     Medications:  Prior to Admission medications   Medication Sig Start Date End Date Taking? Authorizing Provider  aspirin EC 81 MG tablet Take 81 mg by mouth daily.   Yes Historical Provider, MD  bisoprolol (ZEBETA) 5 MG tablet Take 5 mg by mouth daily.   Yes Historical Provider, MD  diphenhydramine-acetaminophen (TYLENOL PM) 25-500 MG TABS Take 1 tablet by mouth at bedtime as needed (sleep).   Yes Historical Provider, MD   ezetimibe (ZETIA) 10 MG tablet Take 10 mg by mouth at bedtime.    Yes Historical Provider, MD  finasteride (PROSCAR) 5 MG tablet Take 5 mg by mouth every morning.    Yes Historical Provider, MD  fish oil-omega-3 fatty acids 1000 MG capsule Take 1 g by mouth every morning.    Yes Historical Provider, MD  furosemide (LASIX) 40 MG tablet Take 20-40 mg by mouth 2 (two) times daily. Takes 40mg  in the morning and 20mg  in the evening   Yes Historical Provider, MD  gemfibrozil (LOPID) 600 MG tablet Take 600 mg by mouth 2 (two) times daily before a meal.    Yes Historical Provider, MD  ibuprofen (ADVIL,MOTRIN) 200 MG tablet Take 200 mg by mouth every 6 (six) hours as needed for pain.   Yes Historical Provider, MD  levothyroxine (SYNTHROID, LEVOTHROID) 50 MCG tablet Take 50 mcg by mouth daily before breakfast.   Yes Historical Provider, MD  LORazepam (ATIVAN) 0.5 MG tablet Take 0.5 mg by mouth at bedtime as needed. For insomnia.   Yes Historical Provider, MD  metoprolol tartrate (LOPRESSOR) 25 MG tablet Take 12.5 mg by mouth 2 (two) times daily.   Yes Historical Provider, MD  mirtazapine (REMERON) 15 MG tablet Take 15 mg by mouth at bedtime.   Yes Historical Provider, MD  Multiple Vitamin (MULTIVITAMIN WITH MINERALS) TABS Take 1 tablet by mouth daily.   Yes Historical Provider, MD  Multiple Vitamins-Minerals (ICAPS PO) Take 1 capsule by mouth daily.    Yes Historical Provider, MD  tamsulosin (FLOMAX) 0.4 MG CAPS capsule Take 0.4 mg by mouth daily.   Yes Historical Provider, MD  warfarin (COUMADIN) 5 MG tablet Take 5-7.5 mg by mouth daily. 1.5 tabs on Sunday and Wednesday, 1 tab daily all other days.   Yes Historical Provider, MD  glyBURIDE (DIABETA) 5 MG tablet Take 2.5-5 mg by mouth daily as needed (high blood sugar).     Antonieta Pert, MD    Allergies:   Allergies  Allergen Reactions  . Morphine And Related Other (See Comments)    Reaction unknown    Social History:  Ambulatory with a  cane Lives at  Home with family next door   reports that he quit smoking about 39 years ago. His smoking use included Cigarettes. He smoked 1.00 pack per day. He has never used smokeless tobacco. He reports that he does not drink alcohol or use illicit drugs.   Family History: family history includes Coronary artery disease in his father; Diabetes in his son; Stomach cancer in his mother. There is no history of Colon cancer.    Physical Exam: Patient Vitals for the past 24 hrs:  BP Temp Temp src Pulse Resp SpO2  05/13/13 0230 101/55 mmHg - - 92 11 98 %  05/13/13 0206 86/51 mmHg 99.4 F (37.4 C) Oral 99 18 99 %  05/13/13 0130 96/52 mmHg - - 99 20 97 %  05/13/13 0127 95/53 mmHg - - 99 - -  05/13/13 0112 88/52 mmHg - - 100 18 -  05/13/13 0040 92/53 mmHg 102.1 F (38.9 C) Rectal - 24 100 %    1. General:  in No Acute distress 2. Psychological: Alert and  Oriented 3. Head/ENT:   Moist  Mucous Membranes                          Head Non traumatic, neck supple                          Normal   Dentition 4. SKIN:  decreased Skin turgor,  Skin clean Dry petechia noted overload extremities. Right lower extremity appear to be red warm with streak going up the side of the thigh  5. Heart: Regular rate and rhythm laud Murmur preset, no Rub or gallop 6. Lungs: Clear to auscultation bilaterally, no wheezes or crackles   7. Abdomen: Soft, non-tender, Non distended 8. Lower extremities: no clubbing, cyanosis, or edema 9. Neurologically Grossly intact, moving all 4 extremities equally 10. MSK: Normal range of motion  body mass index is unknown because there is no weight on file.   Labs on Admission:   Recent Labs  05/13/13 0055  NA 135  K 4.1  CL 100  CO2 24  GLUCOSE 197*  BUN 22  CREATININE 1.16  CALCIUM 8.9    Recent Labs  05/13/13 0055  AST 22  ALT 12  ALKPHOS 41  BILITOT 1.0  PROT 6.9  ALBUMIN 3.5   No results found for this basename: LIPASE, AMYLASE,  in the last 72  hours  Recent Labs  05/13/13 0055  WBC 14.3*  NEUTROABS 11.9*  HGB 12.2*  HCT 35.6*  MCV 97.3  PLT 85*   No results found for this basename: CKTOTAL, CKMB, CKMBINDEX, TROPONINI,  in the last 72 hours No results found for this basename: TSH, T4TOTAL, FREET3, T3FREE, THYROIDAB,  in the last 72 hours No results found for this basename: VITAMINB12, FOLATE, FERRITIN, TIBC, IRON, RETICCTPCT,  in the last 72 hours Lab Results  Component Value Date   HGBA1C 7.3* 08/20/2012    The CrCl is unknown because both a height and weight (above a minimum accepted value) are required for this calculation. ABG    Component Value Date/Time   HCO3 25.8* 06/26/2007 0927   TCO2 24 08/16/2011 0636     No results found for this basename: DDIMER     Other results:  I have pearsonaly reviewed this: ECG REPORT  Rate: 106  Rhythm: tachycardia, RBBB ST&T Change: ST depressions diffuse no old ECG  UA hematouria BNP 1389  Cultures:    Component Value Date/Time   SDES URINE, CLEAN CATCH 03/18/2013 1314   SPECREQUEST Normal 03/18/2013 1314   CULT  Value: Multiple bacterial morphotypes present, none predominant. Suggest appropriate recollection if clinically indicated. Performed at [facility] 03/18/2013 1314   REPTSTATUS 03/19/2013 FINAL 03/18/2013 1314       Radiological Exams on Admission: Dg Chest Port 1 View  05/13/2013   CLINICAL DATA:  Increasing weakness, shortness of breath  EXAM: PORTABLE CHEST - 1 VIEW  COMPARISON:  03/18/2013  FINDINGS: Degraded by rotation. Prominent cardiomediastinal contours. Aortic atherosclerosis. Elevated left hemidiaphragm. Status post CABG. Mild left lung base opacity, favored to reflect atelectasis or scar. Mild haziness to the right lower lung. Status post median sternotomy. Surgical clips project over midline.  IMPRESSION: Degraded by rotation. Small right effusion/consolidation not excluded. Recommend PA and lateral projections when the  patient can  tolerate.   Electronically Signed   By: Jearld Lesch M.D.   On: 05/13/2013 01:38    Chart has been reviewed  Assessment/Plan  79 year old gentleman with history of CHF and coronary disease presents with fatigue shortness of breath fevers was found to have evidence of cellulitis as well as hypertension in the setting of history of melena and history of chronic thrombocytopenia  Present on Admission:  . SIRS (systemic inflammatory response syndrome) - likely related to cellulitis will be consulted blood cultures continue vancomycin and Zosyn given hypotension now improved we'll monitor in step down  . Paroxysmal a-fib - this but rate controlled will hold the metoprolol in case of hypertension hold Coumadin given history of melena and thrombocytopenia  . DM2 (diabetes mellitus, type 2) - sliding scale hold by mouth medications  . CAD (coronary artery disease) - cycle cardiac enzymes and EKG monitor on telemetry  . Cellulitis - cover broadly with vancomycin and Zosyn  . Thrombocytopenia - patient has a chronic history of thrombocytopenia for now will hold Coumadin and check INR . Melena - Hemoccult stool, call GI consult in a.m. (LB)   Prophylaxis: SCD   Protonix  CODE STATUS: DNR/DNI as per patient  Other plan as per orders.  I have spent a total of 55 min on this admission  David Davidson 05/13/2013, 2:40 AM

## 2013-05-13 NOTE — ED Provider Notes (Signed)
CSN: [id number]     Arrival date & time 05/13/13  0034 History   First MD Initiated Contact with Patient 05/13/13 0102     Chief Complaint  Patient presents with  . Weakness   (Consider location/radiation/quality/duration/timing/severity/associated sxs/prior Treatment) HPI Patient reports he started noticing the past month, especially on Sundays, when he would get dressed and go to church he would get very weak and had shortness of breath which would improve if he sat and rested. He states it happened 4 times this past month. He states today however he got short of breath just with walking throughout the house to the den and also when he laid down this evening to go to bed. He denies any pain, cough, or fever, however his son states he had chills today for a prolonged period of time. He states he has normal appetite. He states he's been urinating more the past 3 days which he attributes to a water pill he is taking. He does report some swelling of his right leg but states it is not painful. Patient has history of congestive heart failure and states he had it past surgery done in 1985 and in 1993. He does not use oxygen at home. He normally ambulate with a cane.   PCP Dr Thea Silversmith (appt today)  Past Medical History  Diagnosis Date  . Myocardial infarct   . Diabetes mellitus   . Hypertension   . CHF (congestive heart failure)   . Coronary artery disease   . Clostridium difficile diarrhea     recurrent   . Cancer     Bladder cancer  . Hyperlipidemia   . Pneumonia 04/2011  . Shortness of breath     only with my heart failure "  . Chronic kidney disease   . Dysrhythmia   . Paroxysmal atrial fibrillation    Past Surgical History  Procedure Laterality Date  . Coronary artery bypass graft    . Cholecystectomy    . Cardiac catheterization  10/10/2001  . Cystoscopy, turbt  05/09/2006, 02/19/2006, 04/19/2005, 04/21/2003  . Colonoscopy  01/31/2012    Procedure: COLONOSCOPY;  Surgeon: Man Effertz Boop, MD;  Location: [facility];  Service: Endoscopy;  Laterality: N/A;  fecal transplant/cynthia snyder will prepare the sample   Family History  Problem Relation Age of Onset  . Stomach cancer Mother   . Coronary artery disease Father   . Diabetes Son   . Colon cancer Neg Hx    History  Substance Use Topics  . Smoking status: Former Smoker -- 1.00 packs/day    Types: Cigarettes    Quit date: 05/08/1974  . Smokeless tobacco: Never Used  . Alcohol Use: No     Comment: occasionally   Lives at home Ambulates with a cane  Review of Systems  All other systems reviewed and are negative.    Allergies  Morphine and related  Home Medications   Current Outpatient Rx  Name  Route  Sig  Dispense  Refill  . aspirin EC 81 MG tablet   Oral   Take 81 mg by mouth daily.         . bisoprolol (ZEBETA) 5 MG tablet   Oral   Take 5 mg by mouth daily.         . diphenhydramine-acetaminophen (TYLENOL PM) 25-500 MG TABS   Oral   Take 1 tablet by mouth at bedtime as needed (sleep).         . ezetimibe (ZETIA) 10 MG  tablet   Oral   Take 10 mg by mouth at bedtime.          . finasteride (PROSCAR) 5 MG tablet   Oral   Take 5 mg by mouth every morning.          . fish oil-omega-3 fatty acids 1000 MG capsule   Oral   Take 1 g by mouth every morning.          . furosemide (LASIX) 40 MG tablet   Oral   Take 20-40 mg by mouth 2 (two) times daily. Takes 40mg  in the morning and 20mg  in the evening         . gemfibrozil (LOPID) 600 MG tablet   Oral   Take 600 mg by mouth 2 (two) times daily before a meal.          . ibuprofen (ADVIL,MOTRIN) 200 MG tablet   Oral   Take 200 mg by mouth every 6 (six) hours as needed for pain.         Marland Kitchen levothyroxine (SYNTHROID, LEVOTHROID) 50 MCG tablet   Oral   Take 50 mcg by mouth daily before breakfast.         . LORazepam (ATIVAN) 0.5 MG tablet   Oral   Take 0.5 mg by mouth at bedtime as needed. For insomnia.          . metoprolol tartrate (LOPRESSOR) 25 MG tablet   Oral   Take 12.5 mg by mouth 2 (two) times daily.         . mirtazapine (REMERON) 15 MG tablet   Oral   Take 15 mg by mouth at bedtime.         . Multiple Vitamin (MULTIVITAMIN WITH MINERALS) TABS   Oral   Take 1 tablet by mouth daily.         . Multiple Vitamins-Minerals (ICAPS PO)   Oral   Take 1 capsule by mouth daily.          . tamsulosin (FLOMAX) 0.4 MG CAPS capsule   Oral   Take 0.4 mg by mouth daily.         Marland Kitchen warfarin (COUMADIN) 5 MG tablet   Oral   Take 5-7.5 mg by mouth daily. 1.5 tabs on Sunday and Wednesday, 1 tab daily all other days.         Marland Kitchen glyBURIDE (DIABETA) 5 MG tablet   Oral   Take 2.5-5 mg by mouth daily as needed (high blood sugar).           ED Triage Vitals  Enc Vitals Group     BP 05/13/13 0040 92/53 mmHg     Pulse Rate 05/13/13 0112 100     Resp 05/13/13 0040 24     Temp 05/13/13 0040 102.1 F (38.9 C)     Temp src 05/13/13 0040 Rectal     SpO2 05/13/13 0040 100 %     Weight --      Height --      Head Cir --      Peak Flow --      Pain Score --      Pain Loc --      Pain Edu? --      Excl. in GC? --    Vital signs normal except hypotension, fever (blood pressure documented on prior admission was 106 systolic)   Physical Exam  Nursing note and vitals reviewed. Constitutional: He is oriented to person, place, and  time. He appears well-developed and well-nourished.  Non-toxic appearance. He does not appear ill. No distress.  Pleasant, appears much younger than stated age  HENT:  Head: Normocephalic and atraumatic.  Right Ear: External ear normal.  Left Ear: External ear normal.  Nose: Nose normal. No mucosal edema or rhinorrhea.  Mouth/Throat: Oropharynx is clear and moist and mucous membranes are normal. No dental abscesses or uvula swelling.  Eyes: Conjunctivae and EOM are normal. Pupils are equal, round, and reactive to light.  Neck: Normal range of motion and  full passive range of motion without pain. Neck supple.  Cardiovascular: Normal rate and regular rhythm.  Exam reveals no gallop and no friction rub.   Murmur heard.  Systolic murmur is present  Patient has a very harsh early short systolic murmur heard best in the left upper and right upper sternal border, pt states he has had a long time  Pulmonary/Chest: Effort normal and breath sounds normal. No respiratory distress. He has no wheezes. He has no rhonchi. He has no rales. He exhibits no tenderness and no crepitus.  Abdominal: Soft. Normal appearance and bowel sounds are normal. He exhibits no distension. There is no tenderness. There is no rebound and no guarding.  Musculoskeletal: Normal range of motion. He exhibits no edema and no tenderness.       Legs: Moves all extremities well. Patient has brown discoloration of his left lower leg consistent with venous stasis or diabetic skin changes. However his right lower leg has some mild diffuse swelling with it diffuse redness and warmth with a large wide red streak going up his medial thigh which the patient's son states is new.  Neurological: He is alert and oriented to person, place, and time. He has normal strength. No cranial nerve deficit.  Skin: Skin is warm, dry and intact. No rash noted. No erythema. No pallor.  Psychiatric: He has a normal mood and affect. His speech is normal and behavior is normal. His mood appears not anxious.    ED Course  Procedures (including critical care time)  Medications  sodium chloride 0.9 % bolus 1,000 mL (0 mLs Intravenous Stopped 05/13/13 0130)  acetaminophen (TYLENOL) tablet 650 mg (650 mg Oral Given 05/13/13 0124)  vancomycin (VANCOCIN) IVPB 1000 mg/200 mL premix (1,000 mg Intravenous New Bag/Given 05/13/13 0220)  piperacillin-tazobactam (ZOSYN) IVPB 3.375 g (0 g Intravenous Stopped 05/13/13 0219)  sodium chloride 0.9 % bolus 1,000 mL (0 mLs Intravenous Stopped 05/13/13 0322)  0.9 %  sodium chloride  infusion ( Intravenous New Bag/Given 05/13/13 0219)   Patient started on IV antibiotics for cellulitis with possible early sepsis. His hypotension was treated with 1 L of normal saline however his blood pressure still remained in the 90 systolic range. He was given a second liter of IV fluids and his blood pressure improved to 101/55. IV fluids were given cautiously because of his history of congestive heart failure. Review of his prior records show his blood pressure when seen in October was 106 systolic.  02:11 Dr Kara Pacer, will see in ED and decide bed assignment for admission   Labs Review Results for orders placed during the hospital encounter of 05/13/13  GLUCOSE, CAPILLARY      Result Value Range   Glucose-Capillary 200 (*) 70 - 99 mg/dL   Comment 1 Documented in Chart     Comment 2 Notify RN    CBC WITH DIFFERENTIAL      Result Value Range   WBC 14.3 (*) 4.0 -  10.5 K/uL   RBC 3.66 (*) 4.22 - 5.81 MIL/uL   Hemoglobin 12.2 (*) 13.0 - 17.0 g/dL   HCT 16.1 (*) 09.6 - 04.5 %   MCV 97.3  78.0 - 100.0 fL   MCH 33.3  26.0 - 34.0 pg   MCHC 34.3  30.0 - 36.0 g/dL   RDW 40.9  81.1 - 91.4 %   Platelets 85 (*) 150 - 400 K/uL   Neutrophils Relative % 83 (*) 43 - 77 %   Neutro Abs 11.9 (*) 1.7 - 7.7 K/uL   Lymphocytes Relative 7 (*) 12 - 46 %   Lymphs Abs 0.9  0.7 - 4.0 K/uL   Monocytes Relative 11  3 - 12 %   Monocytes Absolute 1.5 (*) 0.1 - 1.0 K/uL   Eosinophils Relative 0  0 - 5 %   Eosinophils Absolute 0.0  0.0 - 0.7 K/uL   Basophils Relative 0  0 - 1 %   Basophils Absolute 0.0  0.0 - 0.1 K/uL  COMPREHENSIVE METABOLIC PANEL      Result Value Range   Sodium 135  135 - 145 mEq/L   Potassium 4.1  3.5 - 5.1 mEq/L   Chloride 100  96 - 112 mEq/L   CO2 24  19 - 32 mEq/L   Glucose, Bld 197 (*) 70 - 99 mg/dL   BUN 22  6 - 23 mg/dL   Creatinine, Ser 7.82  0.50 - 1.35 mg/dL   Calcium 8.9  8.4 - 95.6 mg/dL   Total Protein 6.9  6.0 - 8.3 g/dL   Albumin 3.5  3.5 - 5.2 g/dL   AST 22  0 -  37 U/L   ALT 12  0 - 53 U/L   Alkaline Phosphatase 41  39 - 117 U/L   Total Bilirubin 1.0  0.3 - 1.2 mg/dL   GFR calc non Af Amer 50 (*) >90 mL/min   GFR calc Af Amer 58 (*) >90 mL/min  URINALYSIS, ROUTINE W REFLEX MICROSCOPIC      Result Value Range   Color, Urine YELLOW  YELLOW   APPearance CLEAR  CLEAR   Specific Gravity, Urine 1.019  1.005 - 1.030   pH 7.5  5.0 - 8.0   Glucose, UA NEGATIVE  NEGATIVE mg/dL   Hgb urine dipstick SMALL (*) NEGATIVE   Bilirubin Urine NEGATIVE  NEGATIVE   Ketones, ur NEGATIVE  NEGATIVE mg/dL   Protein, ur NEGATIVE  NEGATIVE mg/dL   Urobilinogen, UA 1.0  0.0 - 1.0 mg/dL   Nitrite NEGATIVE  NEGATIVE   Leukocytes, UA NEGATIVE  NEGATIVE  URINE MICROSCOPIC-ADD ON      Result Value Range   Squamous Epithelial / LPF RARE  RARE   RBC / HPF 21-50  <3 RBC/hpf  PRO B NATRIURETIC PEPTIDE      Result Value Range   Pro B Natriuretic peptide (BNP) 1389.0 (*) 0 - 450 pg/mL  PROTIME-INR      Result Value Range   Prothrombin Time 22.0 (*) 11.6 - 15.2 seconds   INR 1.99 (*) 0.00 - 1.49  CG4 I-STAT (LACTIC ACID)      Result Value Range   Lactic Acid, Venous 2.71 (*) 0.5 - 2.2 mmol/L   Laboratory interpretation all normal except elevated lactic acid, subtherapeutic Coumadin level, leukocytosis, mild anemia, microscopic hematuria   Imaging Review Dg Chest Port 1 View  05/13/2013   CLINICAL DATA:  Increasing weakness, shortness of breath  EXAM: PORTABLE CHEST - 1  VIEW  COMPARISON:  03/18/2013  FINDINGS: Degraded by rotation. Prominent cardiomediastinal contours. Aortic atherosclerosis. Elevated left hemidiaphragm. Status post CABG. Mild left lung base opacity, favored to reflect atelectasis or scar. Mild haziness to the right lower lung. Status post median sternotomy. Surgical clips project over midline.  IMPRESSION: Degraded by rotation. Small right effusion/consolidation not excluded. Recommend PA and lateral projections when the patient can tolerate.    Electronically Signed   By: Jearld Lesch M.D.   On: 05/13/2013 01:38    EKG Interpretation    Date/Time:  Tuesday May 13 2013 00:37:32 EST Ventricular Rate:  102 PR Interval:  118 QRS Duration: 158 QT Interval:  511 QTC Calculation: 666 R Axis:   10 Text Interpretation:  Age not entered, assumed to be  79 years old for purpose of ECG interpretation Sinus tachycardia Right bundle branch block ST depr, consider ischemia, inferior leads No significant change since last tracing Confirmed by Chantz Montefusco  MD-I, Khalani Novoa ([id number]) on 05/13/2013 3:52:22 AM            MDM   1. Weakness   2. Dyspnea   3. Cellulitis of right lower extremity   4. Hypotension   5. DM2 (diabetes mellitus, type 2)   6. Melena      Plan admission  Devoria Albe, MD, Franz Dell, MD 05/13/13 [phone]

## 2013-05-13 NOTE — Progress Notes (Signed)
MD made aware for (+) MRSA swab result.Patient placed on Contact Isolation.

## 2013-05-13 NOTE — Progress Notes (Signed)
VASCULAR LAB PRELIMINARY  PRELIMINARY  PRELIMINARY  PRELIMINARY  Right lower extremity venous duplex completed.    Preliminary report:  No evidence of right lower extremity DVT or superficial thrombosis. The greater saphenous vein appears to have been harvested for his previous CABG. There is an area of mixed echoes coursing approximately 8 cm from the popliteal fossa to the calf consistent with residue from a ruptured Baker's cyst. There is evidence of enlarged inguinal lymph nodes.  David Davidson, RVS 05/13/2013, 10:32 AM

## 2013-05-13 NOTE — ED Notes (Signed)
Bed: WA09 Expected date:  Expected time:  Means of arrival:  Comments: EMS 79yo M, inc weakness x 1 month, SHOB

## 2013-05-13 NOTE — Progress Notes (Signed)
Paged Dr. Rito Ehrlich regarding critical lab for Trop-0.53. He called back and gave orders to up fluids to 11ml/hr.  I have reported information off to day shift nurse to follow up on.    Ernesta Amble, RN

## 2013-05-13 NOTE — Progress Notes (Signed)
D:  BP of 80/46 on admission.  A: On-call floor coverage notified and bolus ordered and given.  R: BP is now 86/49.  Will continue to monitor and report off to day shift nurse.  Ernesta Amble, RN

## 2013-05-13 NOTE — Progress Notes (Addendum)
  Followup note:  Patient admitted earlier this morning. He is feeling much better. Denies any pain, shortness of breath or chest pain.  Morning labs notes mild increase in creatinine and drop from 12.2 to 10.94 hemoglobin. Pro calcitonin level normal. Second troponin more elevated at 1.54.  Suspect cellulitis is secondary and responding to vancomycin.  More suspicious for GI bleed, which I suspect is caused some demand ischemia. Repeat labs now. Have been forced to give IV fluids and patient grade 3 diastolic dysfunction to treat hypotension. Patient is DO NOT RESUSCITATE and after conversation, we'll not be overly aggressive. Would not be appropriate for cardiac catheterization or endoscopy. Continue supportive measures, keep blood pressure elevated. Follow troponins, recheck hemoglobin and renal function now.  Addendum:Pt's WBC, hemoglobin & renal function all improved.  Will Start full liquids. Repeat labs in the morning.  Updated son by phone

## 2013-05-13 NOTE — Progress Notes (Signed)
MD paged made aware of Troponin result 1.67.

## 2013-05-13 NOTE — ED Notes (Signed)
I-stat CG4 given to Dr. Lynelle Doctor.

## 2013-05-14 DIAGNOSIS — I214 Non-ST elevation (NSTEMI) myocardial infarction: Secondary | ICD-10-CM | POA: Diagnosis present

## 2013-05-14 DIAGNOSIS — R6510 Systemic inflammatory response syndrome (SIRS) of non-infectious origin without acute organ dysfunction: Secondary | ICD-10-CM

## 2013-05-14 LAB — BASIC METABOLIC PANEL
BUN: 19 mg/dL (ref 6–23)
CO2: 23 mEq/L (ref 19–32)
Calcium: 8.2 mg/dL — ABNORMAL LOW (ref 8.4–10.5)
Creatinine, Ser: 1.2 mg/dL (ref 0.50–1.35)
GFR calc Af Amer: 55 mL/min — ABNORMAL LOW (ref >90)
GFR calc non Af Amer: 48 mL/min — ABNORMAL LOW (ref >90)
Glucose, Bld: 147 mg/dL — ABNORMAL HIGH (ref 70–99)
Potassium: 3.6 mEq/L (ref 3.5–5.1)

## 2013-05-14 LAB — CBC
HCT: 31.9 % — ABNORMAL LOW (ref 39.0–52.0)
Hemoglobin: 10.8 g/dL — ABNORMAL LOW (ref 13.0–17.0)
MCH: 33.3 pg (ref 26.0–34.0)
MCHC: 33.9 g/dL (ref 30.0–36.0)
Platelets: 69 10*3/uL — ABNORMAL LOW (ref 150–400)
RDW: 14.3 % (ref 11.5–15.5)

## 2013-05-14 LAB — GLUCOSE, CAPILLARY
Glucose-Capillary: 152 mg/dL — ABNORMAL HIGH (ref 70–99)
Glucose-Capillary: 137 mg/dL — ABNORMAL HIGH (ref 70–99)
Glucose-Capillary: 173 mg/dL — ABNORMAL HIGH (ref 70–99)
Glucose-Capillary: 178 mg/dL — ABNORMAL HIGH (ref 70–99)
Glucose-Capillary: 206 mg/dL — ABNORMAL HIGH (ref 70–99)

## 2013-05-14 LAB — TROPONIN I: Troponin I: 1.11 ng/mL (ref <0.30)

## 2013-05-14 LAB — PROTIME-INR
INR: 2.36 — ABNORMAL HIGH (ref 0.00–1.49)
Prothrombin Time: 25 seconds — ABNORMAL HIGH (ref 11.6–15.2)

## 2013-05-14 LAB — IRON AND TIBC
Iron: 26 ug/dL — ABNORMAL LOW (ref 42–135)
Saturation Ratios: 10 % — ABNORMAL LOW (ref 20–55)
TIBC: 250 ug/dL (ref 215–435)

## 2013-05-14 LAB — VITAMIN B12: Vitamin B-12: 416 pg/mL (ref 211–911)

## 2013-05-14 LAB — FOLATE: Folate: 8.8 ng/mL

## 2013-05-14 LAB — PRO B NATRIURETIC PEPTIDE: Pro B Natriuretic peptide (BNP): 3540 pg/mL — ABNORMAL HIGH (ref 0–450)

## 2013-05-14 MED ORDER — SODIUM CHLORIDE 0.9 % IJ SOLN
3.0000 mL | INTRAMUSCULAR | Status: DC | PRN
  Administered 2013-05-14 – 2013-05-15 (×2): 3 mL via INTRAVENOUS

## 2013-05-14 MED ORDER — FUROSEMIDE 40 MG PO TABS
40.0000 mg | ORAL_TABLET | Freq: Two times a day (BID) | ORAL | Status: DC
  Administered 2013-05-14 – 2013-05-15 (×3): 40 mg via ORAL
  Filled 2013-05-14 (×4): qty 1

## 2013-05-14 MED ORDER — SODIUM CHLORIDE 0.9 % IJ SOLN
3.0000 mL | Freq: Two times a day (BID) | INTRAMUSCULAR | Status: DC
  Administered 2013-05-14: 3 mL via INTRAVENOUS

## 2013-05-14 MED ORDER — SACCHAROMYCES BOULARDII 250 MG PO CAPS
250.0000 mg | ORAL_CAPSULE | Freq: Two times a day (BID) | ORAL | Status: DC
  Administered 2013-05-14 – 2013-05-17 (×6): 250 mg via ORAL
  Filled 2013-05-14 (×7): qty 1

## 2013-05-14 MED ORDER — SODIUM CHLORIDE 0.9 % IV SOLN: 250.0000 mL | INTRAVENOUS | Status: DC | PRN

## 2013-05-14 NOTE — Progress Notes (Addendum)
[facility] PROGRESS NOTE  David Davidson ZOX:[id number] DOB: 1911-06-05 DOA: 05/13/2013 PCP: [name], MD  Interim summary:  Patient is a 79 year old white male with past medical history grade 3 diastolic dysfunction, CAD and diabetes mellitus who presented with progressively worsening shortness of breath on 12/1 evening. In the emergency room, patient was found to be febrile at 102, hypotensive and reported having black tarry stools for the past one week. Patient was treated as septic/hypovolemic shock, GI bleed and cellulitis. He was started on broad-spectrum antibiotics and IV fluids. Care was given to patient's IV fluids, given his history of CHF.   Patient responded well and did not require blood transfusion as his hemoglobin did not drop to below 10.8. His white blood cell count improved on antibiotics and blood pressure stabilized. History bone and also bumped up to as high as 1.67. No aggressive measures such as endoscopy or cardiac catheterization were done given the patient's comorbidities and advanced age. This was discussed with cardiology. This was felt to be secondary to demand ischemia from blood loss, hypotension and infection. It has since trended down. BNP was checked and found to be elevated at 3500 on 12/3 and patient has been started on by mouth Lasix. White count is normalized so antibiotics have been changed to by mouth. Lasix has been increased and once patient has diuresed, it is safe for him to go home.   Assessment/Plan: Active Problems:   CAD (coronary artery disease): Stable.    Paroxysmal a-fib: Has stopped Coumadin, at least for short term given suspected GI bleed. Currently rate controlled    SIRS (systemic inflammatory response syndrome): Secondary cellulitis. Responded well to antibiotics white count normalized.    Thrombocytopenia: Chronic. Watch closely for further bleeding.    DM2 (diabetes mellitus, type 2): Stable. Diet being advanced. Watch  blood sugars.    Cellulitis: On IV vancomycin. White count is normalized, we'll change to by mouth Levaquin. Patient asking for probiotics given previous history of C. difficile    Melena: Stabilized. Likely patient had mild GI bleed. Has stopped Coumadin. Patient initially on daily aspirin and admits to taking Advil PM for the last week. Hemoglobin now stable x24 hours, we'll advance diet. Patient could successfully restart Coumadin in several weeks.    NSTEMI (non-ST elevated myocardial infarction): Likely brought on demand ischemia from hypotension, infection and bleed.  Watching hemoglobin. Troponin trending down. Recheck tomorrow  Acute on chronic diastolic heart failure: Patient now in acute volume overload brought on by aggressive fluid resuscitation given shock and bleed. Fortunately, BNP mild, Have started gentle diuretics, which patient has tolerated. Increase by mouth diuretics. Once he is fully diuresed, based on blood pressure can decide about ACE inhibitor  Code Status: DO NOT RESUSCITATE Family Communication: Updated son yesterday, Disposition Plan: Home once diuresed   Consultants:  None, discussed by phone with cardiology  Procedures:  None  Antibiotics:  IV vancomycin and Zosyn 12/2-12/4  By mouth Levaquin 12/4-present   HPI/Subjective:  patient feeling okay. Breathing comfortably. No chest pain.  Objective: Filed Vitals:   05/14/13 1400  BP: 106/60  Pulse: 81  Temp: 98.7 F (37.1 C)  Resp:     Intake/Output Summary (Last 24 hours) at 05/14/13 1627 Last data filed at 05/14/13 0848  Gross per 24 hour  Intake 1495.83 ml  Output    200 ml  Net 1295.83 ml   Filed Weights   05/13/13 0451  Weight: 94.3 kg (207 lb 14.3 oz)    Exam:  General:   alert and oriented x3, no acute distress  Cardiovascular:  regular rate and rhythm, occasional ectopic beat, 3/6 systolic ejection murmur  Respiratory:  clear auscultation bilaterally  Abdomen:  soft,  nontender, nondistended, positive bowel sounds  Musculoskeletal:  2+ pitting edema from the knee down on the right, 1+ on the left. Erythema showing some improvement on right lower extremity   Data Reviewed: Basic Metabolic Panel:  Recent Labs Lab 05/13/13 0055 05/13/13 0529 05/13/13 1428 05/14/13 0444  NA 135 138 139 137  K 4.1 3.8 3.8 3.6  CL 100 102 104 104  CO2 24 24 26 23   GLUCOSE 197* 181* 157* 147*  BUN 22 21 20 19   CREATININE 1.16 1.28 1.22 1.20  CALCIUM 8.9 8.2* 8.2* 8.2*  MG  --  2.1  --   --   PHOS  --  3.5  --   --    Liver Function Tests:  Recent Labs Lab 05/13/13 0055 05/13/13 0529  AST 22 18  ALT 12 10  ALKPHOS 41 38*  BILITOT 1.0 0.9  PROT 6.9 6.1  ALBUMIN 3.5 3.0*   CBC:  Recent Labs Lab 05/13/13 0055 05/13/13 0529 05/13/13 1428 05/14/13 0444  WBC 14.3* 12.0* 9.1 7.7  NEUTROABS 11.9*  --   --   --   HGB 12.2* 10.9* 11.2* 10.8*  HCT 35.6* 32.8* 33.8* 31.9*  MCV 97.3 98.8 98.8 98.5  PLT 85* 76* 71* 69*   Cardiac Enzymes:  Recent Labs Lab 05/13/13 0529 05/13/13 1030 05/13/13 1604 05/14/13 0930  TROPONINI 0.53* 1.54* 1.67* 1.11*   BNP (last 3 results)  Recent Labs  12/24/12 0814 05/13/13 0055 05/14/13 0930  PROBNP 653.5* 1389.0* 3540.0*   CBG:  Recent Labs Lab 05/13/13 1611 05/13/13 2048 05/13/13 2346 05/14/13 0453 05/14/13 0742  GLUCAP 146* 200* 144* 152* 137*    Recent Results (from the past 240 hour(s))  CULTURE, BLOOD (ROUTINE X 2)     Status: None   Collection Time    05/13/13 12:55 AM      Result Value Range Status   Specimen Description BLOOD RIGHT HAND   Final   Special Requests BOTTLES DRAWN AEROBIC AND ANAEROBIC    Final   Culture  Setup Time     Final   Value: 05/13/2013 04:24     Performed at [facility]   Culture     Final   Value:        BLOOD CULTURE RECEIVED NO GROWTH TO DATE CULTURE WILL BE HELD FOR 5 DAYS BEFORE ISSUING A FINAL NEGATIVE REPORT     Performed at [facility]   Report Status PENDING   Incomplete  CULTURE, BLOOD (ROUTINE X 2)     Status: None   Collection Time    05/13/13  1:15 AM      Result Value Range Status   Specimen Description BLOOD LEFT HAND   Final   Special Requests BOTTLES DRAWN AEROBIC AND ANAEROBIC    Final   Culture  Setup Time     Final   Value: 05/13/2013 04:24     Performed at [facility]   Culture     Final   Value:        BLOOD CULTURE RECEIVED NO GROWTH TO DATE CULTURE WILL BE HELD FOR 5 DAYS BEFORE ISSUING A FINAL NEGATIVE REPORT     Performed at [facility]   Report Status PENDING   Incomplete  MRSA PCR SCREENING     Status: Abnormal   Collection Time    05/13/13 12:54 PM      Result Value Range Status   MRSA by PCR POSITIVE (*) NEGATIVE Final   Comment:            The GeneXpert MRSA Assay (FDA     approved for NASAL specimens     only), is one component of a     comprehensive MRSA colonization     surveillance program. It is not     intended to diagnose MRSA     infection nor to guide or     monitor treatment for     MRSA infections.     RESULT CALLED TO, READ BACK BY AND VERIFIED WITHChancy Milroy RN 1610 05/13/13 A NAVARRO     Studies: Dg Chest Port 1 View  05/13/2013   CLINICAL DATA:  Shortness of breath, fever, history CHF, bladder cancer, hypertension, diabetes, coronary artery disease  EXAM: PORTABLE CHEST - 1 VIEW  COMPARISON:  Portable exam 0826 hr compared to 05/13/2013  FINDINGS: Enlargement of cardiac silhouette post CABG.  Pulmonary vascular congestion.  Scattered interstitial infiltrates question edema/ CHF.  No gross pleural effusion or pneumothorax.  Bones demineralized.  IMPRESSION: Suspect mild CHF.   Electronically Signed   By: Ulyses Southward M.D.   On: 05/13/2013 09:45   Dg Chest Port 1 View  05/13/2013   CLINICAL DATA:  Increasing weakness, shortness of breath  EXAM: PORTABLE CHEST - 1 VIEW  COMPARISON:  03/18/2013  FINDINGS: Degraded by rotation. Prominent  cardiomediastinal contours. Aortic atherosclerosis. Elevated left hemidiaphragm. Status post CABG. Mild left lung base opacity, favored to reflect atelectasis or scar. Mild haziness to the right lower lung. Status post median sternotomy. Surgical clips project over midline.  IMPRESSION: Degraded by rotation. Small right effusion/consolidation not excluded. Recommend PA and lateral projections when the patient can tolerate.   Electronically Signed   By: Jearld Lesch M.D.   On: 05/13/2013 01:38    Scheduled Meds: . Chlorhexidine Gluconate Cloth  6 each Topical Q0600  . docusate sodium  100 mg Oral BID  . ezetimibe  10 mg Oral QHS  . furosemide  40 mg Oral BID  . gemfibrozil  600 mg Oral BID AC  . insulin aspart  0-9 Units Subcutaneous Q4H  . levothyroxine  50 mcg Oral QAC breakfast  . metoprolol tartrate  12.5 mg Oral BID  . mirtazapine  15 mg Oral QHS  . mupirocin ointment  1 application Nasal BID  . pantoprazole (PROTONIX) IV  40 mg Intravenous Q12H  . piperacillin-tazobactam (ZOSYN)  IV  3.375 g Intravenous Q8H  . saccharomyces boulardii  250 mg Oral BID  . sodium chloride  3 mL Intravenous Q12H  . sodium chloride  3 mL Intravenous Q12H  . tamsulosin  0.4 mg Oral Daily  . vancomycin  1,250 mg Intravenous Q24H   Continuous Infusions:   Active Problems:   CAD (coronary artery disease)   Paroxysmal a-fib   SIRS (systemic inflammatory response syndrome)   Thrombocytopenia   DM2 (diabetes mellitus, type 2)   Cellulitis   Melena   NSTEMI (non-ST elevated myocardial infarction)    Time spent:  25 minutes    Hollice Espy  [facility] Pager [phone] If 7PM-7AM, please contact night-coverage at [email or web address], password [facility] 05/14/2013, 4:27 PM  LOS: 1 day

## 2013-05-14 NOTE — Progress Notes (Signed)
Nutrition Brief Note  Patient identified on the Malnutrition Screening Tool (MST) Report  Wt Readings from Last 15 Encounters:  05/13/13 207 lb 14.3 oz (94.3 kg)  03/18/13 210 lb (95.255 kg)  12/26/12 196 lb 8 oz (89.132 kg)  08/23/12 195 lb 12.3 oz (88.8 kg)  01/19/12 202 lb (91.627 kg)  12/20/11 198 lb 12.8 oz (90.175 kg)  12/12/11 196 lb 12 oz (89.245 kg)  09/28/11 177 lb (80.287 kg)  09/13/11 188 lb 4.4 oz (85.4 kg)  08/21/11 217 lb 6 oz (98.6 kg)  07/11/11 193 lb (87.544 kg)  06/25/11 180 lb (81.647 kg)  05/30/11 198 lb 10.2 oz (90.1 kg)  05/17/11 199 lb 12.8 oz (90.629 kg)    Body mass index is 27.43 kg/(m^2). Patient meets criteria for Overweight based on current BMI. Pt states he usually weighs 209 lbs and he reports losing a couple pounds recently but, states his appetite is great and he is eating well. Weight history shows 1.5% weight loss in the past 2 months- not significant.   Current diet order is Full Liquid, patient is consuming approximately 90% of meals at this time. Labs and medications reviewed.   Diet advancement per MD discretion. No nutrition interventions warranted at this time. If nutrition issues arise, please consult RD.   Ian Malkin RD, LDN Inpatient Clinical Dietitian Pager: [phone] After Hours Pager: [phone]

## 2013-05-15 LAB — CBC
HCT: 32.7 % — ABNORMAL LOW (ref 39.0–52.0)
Platelets: 75 10*3/uL — ABNORMAL LOW (ref 150–400)
RDW: 14.3 % (ref 11.5–15.5)
WBC: 7.8 10*3/uL (ref 4.0–10.5)

## 2013-05-15 LAB — BASIC METABOLIC PANEL
BUN: 17 mg/dL (ref 6–23)
Calcium: 8.4 mg/dL (ref 8.4–10.5)
Chloride: 106 mEq/L (ref 96–112)
Creatinine, Ser: 1.25 mg/dL (ref 0.50–1.35)
GFR calc Af Amer: 53 mL/min — ABNORMAL LOW (ref >90)
GFR calc non Af Amer: 45 mL/min — ABNORMAL LOW (ref >90)
Sodium: 141 mEq/L (ref 135–145)

## 2013-05-15 LAB — GLUCOSE, CAPILLARY
Glucose-Capillary: 177 mg/dL — ABNORMAL HIGH (ref 70–99)
Glucose-Capillary: 132 mg/dL — ABNORMAL HIGH (ref 70–99)
Glucose-Capillary: 182 mg/dL — ABNORMAL HIGH (ref 70–99)
Glucose-Capillary: 208 mg/dL — ABNORMAL HIGH (ref 70–99)

## 2013-05-15 MED ORDER — LEVOFLOXACIN 250 MG PO TABS
250.0000 mg | ORAL_TABLET | Freq: Every day | ORAL | Status: DC
  Administered 2013-05-15 – 2013-05-17 (×3): 250 mg via ORAL
  Filled 2013-05-15 (×4): qty 1

## 2013-05-15 MED ORDER — FUROSEMIDE 80 MG PO TABS
80.0000 mg | ORAL_TABLET | Freq: Two times a day (BID) | ORAL | Status: DC
Start: 2013-05-16 — End: 2013-05-16
  Administered 2013-05-16: 80 mg via ORAL
  Filled 2013-05-15 (×3): qty 1

## 2013-05-15 NOTE — Progress Notes (Signed)
Pt states that he will not need Home Health at this present time.

## 2013-05-16 DIAGNOSIS — I4891 Unspecified atrial fibrillation: Secondary | ICD-10-CM

## 2013-05-16 DIAGNOSIS — I10 Essential (primary) hypertension: Secondary | ICD-10-CM

## 2013-05-16 DIAGNOSIS — L0291 Cutaneous abscess, unspecified: Secondary | ICD-10-CM

## 2013-05-16 DIAGNOSIS — E1165 Type 2 diabetes mellitus with hyperglycemia: Secondary | ICD-10-CM

## 2013-05-16 DIAGNOSIS — D649 Anemia, unspecified: Secondary | ICD-10-CM

## 2013-05-16 LAB — CBC
HCT: 34 % — ABNORMAL LOW (ref 39.0–52.0)
Hemoglobin: 11.2 g/dL — ABNORMAL LOW (ref 13.0–17.0)
MCV: 98.8 fL (ref 78.0–100.0)
RDW: 14.1 % (ref 11.5–15.5)
WBC: 5.9 10*3/uL (ref 4.0–10.5)

## 2013-05-16 LAB — GLUCOSE, CAPILLARY
Glucose-Capillary: 151 mg/dL — ABNORMAL HIGH (ref 70–99)
Glucose-Capillary: 133 mg/dL — ABNORMAL HIGH (ref 70–99)
Glucose-Capillary: 160 mg/dL — ABNORMAL HIGH (ref 70–99)

## 2013-05-16 LAB — BASIC METABOLIC PANEL
CO2: 27 mEq/L (ref 19–32)
Chloride: 104 mEq/L (ref 96–112)
GFR calc Af Amer: 57 mL/min — ABNORMAL LOW (ref >90)
Sodium: 141 mEq/L (ref 135–145)

## 2013-05-16 LAB — TROPONIN I: Troponin I: 0.9 ng/mL (ref <0.30)

## 2013-05-16 MED ORDER — POTASSIUM CHLORIDE 10 MEQ/100ML IV SOLN
10.0000 meq | INTRAVENOUS | Status: AC
  Administered 2013-05-16 (×2): 10 meq via INTRAVENOUS
  Filled 2013-05-16 (×2): qty 100

## 2013-05-16 MED ORDER — INSULIN ASPART 100 UNIT/ML ~~LOC~~ SOLN
0.0000 [IU] | Freq: Three times a day (TID) | SUBCUTANEOUS | Status: DC
  Administered 2013-05-16 – 2013-05-17 (×2): 2 [IU] via SUBCUTANEOUS

## 2013-05-16 MED ORDER — METOLAZONE 5 MG PO TABS
5.0000 mg | ORAL_TABLET | Freq: Two times a day (BID) | ORAL | Status: DC
  Administered 2013-05-16: 5 mg via ORAL
  Filled 2013-05-16 (×4): qty 1

## 2013-05-16 MED ORDER — INSULIN ASPART 100 UNIT/ML ~~LOC~~ SOLN
0.0000 [IU] | Freq: Every day | SUBCUTANEOUS | Status: DC
  Administered 2013-05-16: 22:00:00 2 [IU] via SUBCUTANEOUS

## 2013-05-16 MED ORDER — FUROSEMIDE 10 MG/ML IJ SOLN
40.0000 mg | Freq: Two times a day (BID) | INTRAMUSCULAR | Status: DC
  Administered 2013-05-16: 40 mg via INTRAVENOUS
  Filled 2013-05-16 (×4): qty 4

## 2013-05-16 NOTE — Progress Notes (Signed)
[facility] PROGRESS NOTE  David Davidson ZOX:[id number] DOB: 1910-09-29 DOA: 05/13/2013 PCP: [name], MD  HPI/Subjective: Patient is a 79 year old white male with PMHx grade 3 diastolic dysfunction, CAD and diabetes mellitus who presented with progressively worsening shortness of breath on 12/1 evening. In the emergency room, patient was found to be febrile at 102, hypotensive and reported having black tarry stools for the past one week. Patient was treated as septic/hypovolemic shock, GI bleed and cellulitis. He was started on broad-spectrum antibiotics and IV fluids. Care was given to patient's IV fluids, given his history of CHF.   Patient responded well and did not require blood transfusion as his hemoglobin did not drop to below 10.8. His white blood cell count improved on antibiotics and blood pressure stabilized. Troponin high of 1.67. No aggressive measures such as endoscopy or cardiac catheterization were done given the patient's comorbidities and advanced age. This was discussed with cardiology. This was felt to be secondary to demand ischemia from blood loss, hypotension and infection. It has since trended down. BNP was checked and found to be elevated at 3500 on 12/3 and patient has been started on by mouth Lasix. White count is normalized so antibiotics have been changed to by mouth. Lasix has been increased and once patient has diuresed, it is safe for him to go home. Current weight= 96.5 KG, admission weight 94.3 KG (bed) TODAY patient sitting in chair comfortably negative CP, negative SOB, mild pedal edema which patient says is at baseline or better for him.   Assessment/Plan:  CAD (coronary artery disease): - Stable.  Paroxysmal a-fib:  -stopped Coumadin secondary to GI bleed -Currently rate controlled  SIRS (systemic inflammatory response syndrome):  -Secondary cellulitis. Responded well to antibiotics white count normalized.  Thrombocytopenia:  -Chronic.  Watch closely for further bleeding.  DM2 (diabetes mellitus, type 2):  -Stable. Diet being advanced. Watch blood sugars.  Cellulitis: -Continue Levaquin PO.  -Continue probiotics given previous history of C. difficile  Melena:  -Stabilized. Likely patient had mild GI bleed. Stopped Coumadin.  -Patient initially on daily aspirin and admits to taking Advil PM for the last week.  -Hemoglobin now stable. Will advance diet. - Patient could successfully restart Coumadin in several weeks as an outpatient.  NSTEMI (non-ST elevated myocardial infarction):  -Likely brought on demand ischemia from hypotension, infection and bleed.  -Watching hemoglobin.  -Recheck Troponin and proBNP   Acute on chronic diastolic heart failure:  -Patient now in acute volume overload brought on by aggressive fluid resuscitation given shock and bleed. -Will start aggressive diuresis Lasix 40 mg IV BID + Zaroxolyn 5 mg BID  -Daily a.m. weights on a standing scale -Strict I and O.  Hypokalemia -Will replete IV, recheck in a.m.  Code Status: DO NOT RESUSCITATE Family Communication:  Disposition Plan: Home once diuresed   Consultants:  None, discussed by phone with cardiology  Procedures:  None  Antibiotics:  IV vancomycin and Zosyn 12/2>> stopped 12/4  Levaquin PO 12/4>>>      Objective: Filed Vitals:   05/16/13 0514  BP: 100/60  Pulse: 87  Temp: 98.5 F (36.9 C)  Resp: 18    Intake/Output Summary (Last 24 hours) at 05/16/13 1252 Last data filed at 05/16/13 1100  Gross per 24 hour  Intake    243 ml  Output      0 ml  Net    243 ml   Filed Weights   05/13/13 0451 05/15/13 0515 05/16/13 0514  Weight: 94.3 kg (207  lb 14.3 oz) 96.1 kg (211 lb 13.8 oz) 96.5 kg (212 lb 11.9 oz)    Exam:   General:   A./O. x4, NAD   Cardiovascular:  regular rate and rhythm, 3/6 systolic ejection murmur, negative rubs or gallops, DP/PT pulse +1 bilateral  Respiratory:  clear auscultation  bilaterally  Abdomen:  soft, nontender, nondistended, positive bowel sounds  Musculoskeletal:  2+ pitting edema from midshin down on the right, negative pedal edema on the left. Erythema bilateral lower extremity between mid shin and feet nontender    Data Reviewed: Basic Metabolic Panel:  Recent Labs Lab 05/13/13 0529 05/13/13 1428 05/14/13 0444 05/15/13 0420 05/16/13 0527  NA 138 139 137 141 141  K 3.8 3.8 3.6 3.6 3.4*  CL 102 104 104 106 104  CO2 24 26 23 25 27   GLUCOSE 181* 157* 147* 159* 142*  BUN 21 20 19 17 15   CREATININE 1.28 1.22 1.20 1.25 1.17  CALCIUM 8.2* 8.2* 8.2* 8.4 8.2*  MG 2.1  --   --   --   --   PHOS 3.5  --   --   --   --    Liver Function Tests:  Recent Labs Lab 05/13/13 0055 05/13/13 0529  AST 22 18  ALT 12 10  ALKPHOS 41 38*  BILITOT 1.0 0.9  PROT 6.9 6.1  ALBUMIN 3.5 3.0*   CBC:  Recent Labs Lab 05/13/13 0055 05/13/13 0529 05/13/13 1428 05/14/13 0444 05/15/13 0420 05/16/13 0527  WBC 14.3* 12.0* 9.1 7.7 7.8 5.9  NEUTROABS 11.9*  --   --   --   --   --   HGB 12.2* 10.9* 11.2* 10.8* 10.9* 11.2*  HCT 35.6* 32.8* 33.8* 31.9* 32.7* 34.0*  MCV 97.3 98.8 98.8 98.5 98.5 98.8  PLT 85* 76* 71* 69* 75* 100*   Cardiac Enzymes:  Recent Labs Lab 05/13/13 0529 05/13/13 1030 05/13/13 1604 05/14/13 0930 05/16/13 0527  TROPONINI 0.53* 1.54* 1.67* 1.11* 0.90*   BNP (last 3 results)  Recent Labs  12/24/12 0814 05/13/13 0055 05/14/13 0930  PROBNP 653.5* 1389.0* 3540.0*   CBG:  Recent Labs Lab 05/15/13 1922 05/15/13 2329 05/16/13 0359 05/16/13 0731 05/16/13 1148  GLUCAP 182* 208* 151* 133* 192*    Recent Results (from the past 240 hour(s))  CULTURE, BLOOD (ROUTINE X 2)     Status: None   Collection Time    05/13/13 12:55 AM      Result Value Range Status   Specimen Description BLOOD RIGHT HAND   Final   Special Requests BOTTLES DRAWN AEROBIC AND ANAEROBIC    Final   Culture  Setup Time     Final   Value: 05/13/2013  04:24     Performed at [facility]   Culture     Final   Value:        BLOOD CULTURE RECEIVED NO GROWTH TO DATE CULTURE WILL BE HELD FOR 5 DAYS BEFORE ISSUING A FINAL NEGATIVE REPORT     Performed at [facility]   Report Status PENDING   Incomplete  CULTURE, BLOOD (ROUTINE X 2)     Status: None   Collection Time    05/13/13  1:15 AM      Result Value Range Status   Specimen Description BLOOD LEFT HAND   Final   Special Requests BOTTLES DRAWN AEROBIC AND ANAEROBIC    Final   Culture  Setup Time     Final   Value: 05/13/2013  04:24     Performed at [facility]     Final   Value:        BLOOD CULTURE RECEIVED NO GROWTH TO DATE CULTURE WILL BE HELD FOR 5 DAYS BEFORE ISSUING A FINAL NEGATIVE REPORT     Performed at [facility]   Report Status PENDING   Incomplete  MRSA PCR SCREENING     Status: Abnormal   Collection Time    05/13/13 12:54 PM      Result Value Range Status   MRSA by PCR POSITIVE (*) NEGATIVE Final   Comment:            The GeneXpert MRSA Assay (FDA     approved for NASAL specimens     only), is one component of a     comprehensive MRSA colonization     surveillance program. It is not     intended to diagnose MRSA     infection nor to guide or     monitor treatment for     MRSA infections.     RESULT CALLED TO, READ BACK BY AND VERIFIED WITHChancy Milroy RN 1027 05/13/13 A NAVARRO     Studies: No results found.  Scheduled Meds: . Chlorhexidine Gluconate Cloth  6 each Topical Q0600  . docusate sodium  100 mg Oral BID  . ezetimibe  10 mg Oral QHS  . furosemide  80 mg Oral BID  . gemfibrozil  600 mg Oral BID AC  . insulin aspart  0-9 Units Subcutaneous Q4H  . levofloxacin  250 mg Oral Daily  . levothyroxine  50 mcg Oral QAC breakfast  . metoprolol tartrate  12.5 mg Oral BID  . mirtazapine  15 mg Oral QHS  . mupirocin ointment  1 application Nasal BID  . pantoprazole (PROTONIX) IV  40 mg Intravenous Q12H  .  saccharomyces boulardii  250 mg Oral BID  . sodium chloride  3 mL Intravenous Q12H  . tamsulosin  0.4 mg Oral Daily   Continuous Infusions:   Principal Problem:   Acute on chronic diastolic CHF (congestive heart failure) Active Problems:   CAD (coronary artery disease)   Paroxysmal a-fib   SIRS (systemic inflammatory response syndrome)   Thrombocytopenia   DM2 (diabetes mellitus, type 2)   Cellulitis   Melena   NSTEMI (non-ST elevated myocardial infarction)    Time spent:  40 minutes    Drema Dallas  [facility] Pager [phone] If 7PM-7AM, please contact night-coverage at [email or web address], password [facility] 05/16/2013, 12:52 PM  LOS: 3 days

## 2013-05-16 NOTE — Evaluation (Signed)
Physical Therapy Evaluation Patient Details Name: David Davidson MRN: [id number] DOB: 15-Sep-1910 Today's Date: 05/16/2013 Time: 4098-1191 PT Time Calculation (min): 19 min  PT Assessment / Plan / Recommendation History of Present Illness  79 year old white male with past medical history grade 3 diastolic dysfunction, CAD and diabetes mellitus who presented with progressively worsening shortness of breath on 12/1 evening. In the emergency room, patient was found to be febrile at 102, hypotensive and reported having black tarry stools for the past one week. Patient was treated as septic/hypovolemic shock, GI bleed and cellulitis. He was started on broad-spectrum antibiotics and IV fluids. Care was given to patient's IV fluids, given his history of CHF.   Clinical Impression  Pt admitted with CAD and NSTEMI.  Pt currently with functional limitations due to the deficits listed below (see PT Problem List).  Pt will benefit from skilled PT to increase their independence and safety with mobility to allow discharge to the venue listed below.   Pt unsteady with 1 HHA into bathroom so utilized RW for improved stability during ambulation.  Pt declines HHPT recommendation but agreeable to using RW upon d/c instead of cane (his baseline).  Pt reports son lives nearby and his family assists as needed.     PT Assessment  Patient needs continued PT services    Follow Up Recommendations  Home health PT (however pt declines at this time)    Does the patient have the potential to tolerate intense rehabilitation      Barriers to Discharge        Equipment Recommendations  None recommended by PT    Recommendations for Other Services     Frequency Min 3X/week    Precautions / Restrictions Precautions Precautions: Fall   Pertinent Vitals/Pain n/a      Mobility  Bed Mobility Bed Mobility: Supine to Sit Supine to Sit: 6: Modified independent (Device/Increase time) Transfers Transfers: Sit to  Stand;Stand to Sit Sit to Stand: 4: Min guard;With upper extremity assist;From bed;From toilet Stand to Sit: 4: Min guard;With upper extremity assist;To toilet;To bed Details for Transfer Assistance: verbal cues for hand placement Ambulation/Gait Ambulation/Gait Assistance: 4: Min assist;4: Min guard Ambulation Distance (Feet): 200 Feet Assistive device: Rolling walker Ambulation/Gait Assistance Details: pt ambulated to bathroom with 1 HHA and required min assist for steadying so used RW for hallway ambulation with improved steadiness and min/guard assist Gait Pattern: Step-through pattern;Trunk flexed General Gait Details: verbal cues for RW distance and posture    Exercises     PT Diagnosis: Difficulty walking;Generalized weakness  PT Problem List: Decreased strength;Decreased balance;Decreased mobility;Decreased knowledge of use of DME PT Treatment Interventions: DME instruction;Gait training;Functional mobility training;Stair training;Therapeutic activities;Therapeutic exercise;Patient/family education;Balance training;Neuromuscular re-education     PT Goals(Current goals can be found in the care plan section) Acute Rehab PT Goals PT Goal Formulation: With patient Time For Goal Achievement: 05/23/13 Potential to Achieve Goals: Good  Visit Information  Last PT Received On: 05/16/13 Assistance Needed: +1 History of Present Illness: 79 year old white male with past medical history grade 3 diastolic dysfunction, CAD and diabetes mellitus who presented with progressively worsening shortness of breath on 12/1 evening. In the emergency room, patient was found to be febrile at 102, hypotensive and reported having black tarry stools for the past one week. Patient was treated as septic/hypovolemic shock, GI bleed and cellulitis. He was started on broad-spectrum antibiotics and IV fluids. Care was given to patient's IV fluids, given his history of CHF.  Prior Functioning  Home  Living Family/patient expects to be discharged to:: Private residence Living Arrangements: Alone Available Help at Discharge: Family;Available PRN/intermittently (son lives nearby) Type of Home: House Home Access: Stairs to enter [employer] of Steps: 3-4 Entrance Stairs-Rails: Right;Left Home Layout: One level Home Equipment: Environmental consultant - 2 wheels;Cane - single point Additional Comments: States son lives next door Prior Function Level of Independence: Independent with assistive device(s) Comments: uses SPC outside, states no hx of falls Communication Communication: No difficulties    Cognition  Cognition Arousal/Alertness: Awake/alert Behavior During Therapy: WFL for tasks assessed/performed Overall Cognitive Status: Within Functional Limits for tasks assessed    Extremity/Trunk Assessment Lower Extremity Assessment Lower Extremity Assessment: Generalized weakness   Balance    End of Session PT - End of Session Activity Tolerance: Patient tolerated treatment well Patient left: in bed;with call bell/phone within reach  GP     Korin Hartwell,KATHrine E 05/16/2013, 3:28 PM Zenovia Jarred, PT, DPT 05/16/2013 Pager: [phone]

## 2013-05-17 DIAGNOSIS — R5381 Other malaise: Secondary | ICD-10-CM

## 2013-05-17 DIAGNOSIS — I5043 Acute on chronic combined systolic (congestive) and diastolic (congestive) heart failure: Secondary | ICD-10-CM

## 2013-05-17 LAB — CBC WITH DIFFERENTIAL/PLATELET
Basophils Absolute: 0 10*3/uL (ref 0.0–0.1)
Basophils Relative: 0 % (ref 0–1)
Eosinophils Absolute: 0.2 10*3/uL (ref 0.0–0.7)
Eosinophils Relative: 3 % (ref 0–5)
HCT: 33 % — ABNORMAL LOW (ref 39.0–52.0)
Hemoglobin: 11.1 g/dL — ABNORMAL LOW (ref 13.0–17.0)
Lymphocytes Relative: 27 % (ref 12–46)
Lymphs Abs: 1.7 10*3/uL (ref 0.7–4.0)
MCH: 32.7 pg (ref 26.0–34.0)
MCHC: 33.6 g/dL (ref 30.0–36.0)
MCV: 97.3 fL (ref 78.0–100.0)
Monocytes Absolute: 1.2 10*3/uL — ABNORMAL HIGH (ref 0.1–1.0)
Monocytes Relative: 19 % — ABNORMAL HIGH (ref 3–12)
Neutro Abs: 3.1 10*3/uL (ref 1.7–7.7)
Neutrophils Relative %: 50 % (ref 43–77)
Platelets: 103 10*3/uL — ABNORMAL LOW (ref 150–400)
RBC: 3.39 MIL/uL — ABNORMAL LOW (ref 4.22–5.81)
RDW: 13.9 % (ref 11.5–15.5)
WBC: 6.2 10*3/uL (ref 4.0–10.5)

## 2013-05-17 LAB — COMPREHENSIVE METABOLIC PANEL
ALT: 17 U/L (ref 0–53)
AST: 22 U/L (ref 0–37)
Albumin: 3.3 g/dL — ABNORMAL LOW (ref 3.5–5.2)
Alkaline Phosphatase: 47 U/L (ref 39–117)
BUN: 22 mg/dL (ref 6–23)
CO2: 29 mEq/L (ref 19–32)
Calcium: 8.6 mg/dL (ref 8.4–10.5)
Chloride: 99 mEq/L (ref 96–112)
Creatinine, Ser: 1.46 mg/dL — ABNORMAL HIGH (ref 0.50–1.35)
GFR calc Af Amer: 44 mL/min — ABNORMAL LOW (ref >90)
GFR calc non Af Amer: 38 mL/min — ABNORMAL LOW (ref >90)
Glucose, Bld: 169 mg/dL — ABNORMAL HIGH (ref 70–99)
Potassium: 3.2 mEq/L — ABNORMAL LOW (ref 3.5–5.1)
Sodium: 138 mEq/L (ref 135–145)
Total Bilirubin: 0.9 mg/dL (ref 0.3–1.2)
Total Protein: 7.4 g/dL (ref 6.0–8.3)

## 2013-05-17 LAB — GLUCOSE, CAPILLARY: Glucose-Capillary: 217 mg/dL — ABNORMAL HIGH (ref 70–99)

## 2013-05-17 MED ORDER — FUROSEMIDE 20 MG PO TABS: 20.0000 mg | ORAL_TABLET | Freq: Every evening | ORAL | Status: DC

## 2013-05-17 MED ORDER — FUROSEMIDE 40 MG PO TABS
40.0000 mg | ORAL_TABLET | Freq: Every morning | ORAL | Status: DC
  Administered 2013-05-17: 11:00:00 40 mg via ORAL
  Filled 2013-05-17: qty 1

## 2013-05-17 MED ORDER — POTASSIUM CHLORIDE CRYS ER 20 MEQ PO TBCR
30.0000 meq | EXTENDED_RELEASE_TABLET | Freq: Every day | ORAL | Status: DC
  Administered 2013-05-17: 10:00:00 30 meq via ORAL
  Filled 2013-05-17: qty 1

## 2013-05-17 MED ORDER — MUPIROCIN 2 % EX OINT: 1.0000 "application " | TOPICAL_OINTMENT | Freq: Two times a day (BID) | CUTANEOUS | Status: DC

## 2013-05-17 MED ORDER — POTASSIUM CHLORIDE CRYS ER 15 MEQ PO TBCR: 30.0000 meq | EXTENDED_RELEASE_TABLET | Freq: Every day | ORAL | Status: DC

## 2013-05-17 MED ORDER — SACCHAROMYCES BOULARDII 250 MG PO CAPS: 250.0000 mg | ORAL_CAPSULE | Freq: Two times a day (BID) | ORAL | Status: DC

## 2013-05-17 MED ORDER — HYDROCODONE-ACETAMINOPHEN 5-325 MG PO TABS: 1.0000 | ORAL_TABLET | ORAL | Status: DC | PRN

## 2013-05-17 MED ORDER — POTASSIUM CHLORIDE CRYS ER 20 MEQ PO TBCR
40.0000 meq | EXTENDED_RELEASE_TABLET | Freq: Once | ORAL | Status: DC
  Filled 2013-05-17: qty 2

## 2013-05-17 MED ORDER — FUROSEMIDE 20 MG PO TABS
20.0000 mg | ORAL_TABLET | Freq: Every evening | ORAL | Status: DC
  Filled 2013-05-17: qty 1

## 2013-05-17 MED ORDER — FUROSEMIDE 40 MG PO TABS: 40.0000 mg | ORAL_TABLET | Freq: Every morning | ORAL | Status: DC

## 2013-05-17 NOTE — Discharge Summary (Signed)
Physician Discharge Summary  David Davidson:[id number] DOB: [birth year] DOA: 05/13/2013  PCP: [name], MD  Admit date: 05/13/2013 Discharge date: 05/17/2013  Time spent: 35 minutes  Recommendations for Outpatient Follow-up:  CAD (coronary artery disease):  - Stable.   Paroxysmal a-fib:  -stopped Coumadin secondary to GI bleed  -Currently rate controlled; patient will followup with her cardiologist to determine exact time when to start anticoagulant therapy.   SIRS (systemic inflammatory response syndrome):  -Secondary cellulitis. Responded well to antibiotics white count normalized.   Thrombocytopenia:  -Chronic. Watch closely for further bleeding.   DM2 (diabetes mellitus, type 2):  -Stable.  -On discharge restart glyburide, PCP to titrate for affect    Cellulitis:  -Continue Levaquin PO. Stop date 12/14 -Continue probiotics given previous history of C. difficile   Melena:  -Stabilized. Likely patient had mild GI bleed. Stopped Coumadin.  -Patient initially on daily aspirin and admits to taking Advil PM for the last week.  -Hemoglobin now stable. Will advance diet.  - Patient could successfully restart Coumadin in several weeks as an outpatient. Counseled to discuss exact start time anticoagulations with cardiologist  NSTEMI (non-ST elevated myocardial infarction):  -Likely brought on demand ischemia from hypotension, infection and bleed.  -PCP to recheck Hemoglobin at the first appointment .   Acute on chronic diastolic heart failure:  -Patient now in acute volume overload brought on by aggressive fluid resuscitation given shock and bleed.  -Will start aggressive diuresis Lasix 40 mg IV BID + Zaroxolyn 5 mg BID  -Sliding scale Lasix: Weigh yourself when you get home, then Daily in the Morning. Your dry weight will be what your scale says on the day you return home.(here is    lbs.).   If you gain more than 3 pounds from dry weight: Increase the Lasix dosing  to 60 mg in the morning and 40 mg in the afternoon until weight returns to baseline dry weight.  If weight gain is greater than 5 pounds in 2 days: Increased to Lasix 60 mg twice a day and contact the office for further assistance if weight does not go down the next day.  If the weight goes down more than 3 pounds from dry weight: Hold Lasix until it returns to baseline dry weight   Hypokalemia  -Slightly low but decreasing patient's Lasix dose  -Will discharge patient on K. Dur daily and have PCP adjust     Discharge Diagnoses:  Principal Problem:   Acute on chronic diastolic CHF (congestive heart failure) Active Problems:   CAD (coronary artery disease)   Paroxysmal a-fib   SIRS (systemic inflammatory response syndrome)   Thrombocytopenia   DM2 (diabetes mellitus, type 2)   Cellulitis   Melena   NSTEMI (non-ST elevated myocardial infarction)   Discharge Condition: Stable  Diet recommendation: Heart healthy  Filed Weights   05/15/13 0515 05/16/13 0514 05/17/13 0500  Weight: 96.1 kg (211 lb 13.8 oz) 96.5 kg (212 lb 11.9 oz) 96.4 kg (212 lb 8.4 oz)    History of present illness:  79 year old white male with PMHx grade 3 diastolic dysfunction, CAD and diabetes mellitus who presented with progressively worsening shortness of breath on 12/1 evening. In the emergency room, patient was found to be febrile at 102, hypotensive and reported having black tarry stools for the past one week. Patient was treated as septic/hypovolemic shock, GI bleed and cellulitis. He was started on broad-spectrum antibiotics and IV fluids. Care was given to patient's IV fluids, given his  history of CHF.  Patient responded well and did not require blood transfusion as his hemoglobin did not drop to below 10.8. His white blood cell count improved on antibiotics and blood pressure stabilized. Troponin high of 1.67. No aggressive measures such as endoscopy or cardiac catheterization were done given the  patient's comorbidities and advanced age. This was discussed with cardiology. This was felt to be secondary to demand ischemia from blood loss, hypotension and infection. It has since trended down. BNP was checked and found to be elevated at 3500 on 12/3 and patient has been started on by mouth Lasix. White count is normalized so antibiotics have been changed to by mouth. Lasix has been increased and once patient has diuresed, it is safe for him to go home. Current weight= 96.4 KG, admission weight 94.3 KG (bed) 05/16/2013 patient sitting in chair comfortably negative CP, negative SOB, mild pedal edema which patient says is at baseline or better for him. TODAY sitting in bed completing Breakfast. (-) CP, (-) SOB. Ready for D/C hm. States Son will pick him up. Patient's creatinine increased overnight showing that we have reached our endpoint for diuresis.   Consultants:  None, discussed by phone with cardiology   Procedures:  None   Antibiotics:  IV vancomycin and Zosyn 12/2>> stopped 12/4  Levaquin PO 12/4>>> Stop date 12/14   Discharge Exam: Filed Vitals:   05/16/13 1443 05/16/13 2113 05/17/13 0428 05/17/13 0500  BP: 110/65 95/59 96/51    Pulse: 90 105 102   Temp: 98.2 F (36.8 C) 98.2 F (36.8 C) 98.3 F (36.8 C)   TempSrc: Oral Oral Oral   Resp: 18 18 18    Height:      Weight:    96.4 kg (212 lb 8.4 oz)  SpO2: 95% 96% 96%     General: A./O. x4, NAD Cardiovascular: regular rate and rhythm, 3/6 systolic ejection murmur, negative rubs or gallops, DP/PT pulse +1 bilateral  Respiratory: clear auscultation bilaterally  Abdomen: soft, nontender, nondistended, positive bowel sounds  Musculoskeletal: Negative pedal edema bilateral. Erythema bilateral lower extremity between mid shin and feet nontender.    Discharge Instructions     Medication List    STOP taking these medications       aspirin EC 81 MG tablet     bisoprolol 5 MG tablet  Commonly known as:  ZEBETA      warfarin 5 MG tablet  Commonly known as:  COUMADIN      TAKE these medications       diphenhydramine-acetaminophen 25-500 MG Tabs  Commonly known as:  TYLENOL PM  Take 1 tablet by mouth at bedtime as needed (sleep).     ezetimibe 10 MG tablet  Commonly known as:  ZETIA  Take 10 mg by mouth at bedtime.     finasteride 5 MG tablet  Commonly known as:  PROSCAR  Take 5 mg by mouth every morning.     fish oil-omega-3 fatty acids 1000 MG capsule  Take 1 g by mouth every morning.     furosemide 20 MG tablet  Commonly known as:  LASIX  Take 1 tablet (20 mg total) by mouth every evening.     furosemide 40 MG tablet  Commonly known as:  LASIX  Take 1 tablet (40 mg total) by mouth every morning.     gemfibrozil 600 MG tablet  Commonly known as:  LOPID  Take 600 mg by mouth 2 (two) times daily before a meal.     glyBURIDE  5 MG tablet  Commonly known as:  DIABETA  Take 2.5-5 mg by mouth daily as needed (high blood sugar).     HYDROcodone-acetaminophen 5-325 MG per tablet  Commonly known as:  NORCO/VICODIN  Take 1-2 tablets by mouth every 4 (four) hours as needed for moderate pain.     ibuprofen 200 MG tablet  Commonly known as:  ADVIL,MOTRIN  Take 200 mg by mouth every 6 (six) hours as needed for pain.     ICAPS PO  Take 1 capsule by mouth daily.     levothyroxine 50 MCG tablet  Commonly known as:  SYNTHROID, LEVOTHROID  Take 50 mcg by mouth daily before breakfast.     LORazepam 0.5 MG tablet  Commonly known as:  ATIVAN  Take 0.5 mg by mouth at bedtime as needed. For insomnia.     metoprolol tartrate 25 MG tablet  Commonly known as:  LOPRESSOR  Take 12.5 mg by mouth 2 (two) times daily.     mirtazapine 15 MG tablet  Commonly known as:  REMERON  Take 15 mg by mouth at bedtime.     multivitamin with minerals Tabs tablet  Take 1 tablet by mouth daily.     mupirocin ointment 2 %  Commonly known as:  BACTROBAN  Place 1 application into the nose 2 (two) times daily.      saccharomyces boulardii 250 MG capsule  Commonly known as:  FLORASTOR  Take 1 capsule (250 mg total) by mouth 2 (two) times daily.     tamsulosin 0.4 MG Caps capsule  Commonly known as:  FLOMAX  Take 0.4 mg by mouth daily.       Allergies  Allergen Reactions  . Morphine And Related Other (See Comments)    Reaction unknown      The results of significant diagnostics from this hospitalization (including imaging, microbiology, ancillary and laboratory) are listed below for reference.    Significant Diagnostic Studies: Dg Chest Port 1 View  05/13/2013   CLINICAL DATA:  Shortness of breath, fever, history CHF, bladder cancer, hypertension, diabetes, coronary artery disease  EXAM: PORTABLE CHEST - 1 VIEW  COMPARISON:  Portable exam 0826 hr compared to 05/13/2013  FINDINGS: Enlargement of cardiac silhouette post CABG.  Pulmonary vascular congestion.  Scattered interstitial infiltrates question edema/ CHF.  No gross pleural effusion or pneumothorax.  Bones demineralized.  IMPRESSION: Suspect mild CHF.   Electronically Signed   By: Ulyses Southward M.D.   On: 05/13/2013 09:45   Dg Chest Port 1 View  05/13/2013   CLINICAL DATA:  Increasing weakness, shortness of breath  EXAM: PORTABLE CHEST - 1 VIEW  COMPARISON:  03/18/2013  FINDINGS: Degraded by rotation. Prominent cardiomediastinal contours. Aortic atherosclerosis. Elevated left hemidiaphragm. Status post CABG. Mild left lung base opacity, favored to reflect atelectasis or scar. Mild haziness to the right lower lung. Status post median sternotomy. Surgical clips project over midline.  IMPRESSION: Degraded by rotation. Small right effusion/consolidation not excluded. Recommend PA and lateral projections when the patient can tolerate.   Electronically Signed   By: Jearld Lesch M.D.   On: 05/13/2013 01:38    Microbiology: Recent Results (from the past 240 hour(s))  CULTURE, BLOOD (ROUTINE X 2)     Status: None   Collection Time    05/13/13  12:55 AM      Result Value Range Status   Specimen Description BLOOD RIGHT HAND   Final   Special Requests BOTTLES DRAWN AEROBIC AND ANAEROBIC   Final   Culture  Setup Time     Final   Value: 05/13/2013 04:24     Performed at [facility]   Culture     Final   Value:        BLOOD CULTURE RECEIVED NO GROWTH TO DATE CULTURE WILL BE HELD FOR 5 DAYS BEFORE ISSUING A FINAL NEGATIVE REPORT     Performed at [facility]   Report Status PENDING   Incomplete  CULTURE, BLOOD (ROUTINE X 2)     Status: None   Collection Time    05/13/13  1:15 AM      Result Value Range Status   Specimen Description BLOOD LEFT HAND   Final   Special Requests BOTTLES DRAWN AEROBIC AND ANAEROBIC    Final   Culture  Setup Time     Final   Value: 05/13/2013 04:24     Performed at [facility]   Culture     Final   Value:        BLOOD CULTURE RECEIVED NO GROWTH TO DATE CULTURE WILL BE HELD FOR 5 DAYS BEFORE ISSUING A FINAL NEGATIVE REPORT     Performed at [facility]   Report Status PENDING   Incomplete  MRSA PCR SCREENING     Status: Abnormal   Collection Time    05/13/13 12:54 PM      Result Value Range Status   MRSA by PCR POSITIVE (*) NEGATIVE Final   Comment:            The GeneXpert MRSA Assay (FDA     approved for NASAL specimens     only), is one component of a     comprehensive MRSA colonization     surveillance program. It is not     intended to diagnose MRSA     infection nor to guide or     monitor treatment for     MRSA infections.     RESULT CALLED TO, READ BACK BY AND VERIFIED WITHChancy Milroy RN 1610 05/13/13 A NAVARRO     Labs: Basic Metabolic Panel:  Recent Labs Lab 05/13/13 0529 05/13/13 1428 05/14/13 0444 05/15/13 0420 05/16/13 0527 05/17/13 0530  NA 138 139 137 141 141 138  K 3.8 3.8 3.6 3.6 3.4* 3.2*  CL 102 104 104 106 104 99  CO2 24 26 23 25 27 29   GLUCOSE 181* 157* 147* 159* 142* 169*  BUN 21 20 19 17 15 22   CREATININE 1.28  1.22 1.20 1.25 1.17 1.46*  CALCIUM 8.2* 8.2* 8.2* 8.4 8.2* 8.6  MG 2.1  --   --   --   --  2.3  PHOS 3.5  --   --   --   --   --    Liver Function Tests:  Recent Labs Lab 05/13/13 0055 05/13/13 0529 05/17/13 0530  AST 22 18 22   ALT 12 10 17   ALKPHOS 41 38* 47  BILITOT 1.0 0.9 0.9  PROT 6.9 6.1 7.4  ALBUMIN 3.5 3.0* 3.3*   No results found for this basename: LIPASE, AMYLASE,  in the last 168 hours No results found for this basename: AMMONIA,  in the last 168 hours CBC:  Recent Labs Lab 05/13/13 0055  05/13/13 1428 05/14/13 0444 05/15/13 0420 05/16/13 0527 05/17/13 0530  WBC 14.3*  < > 9.1 7.7 7.8 5.9 6.2  NEUTROABS 11.9*  --   --   --   --   --  3.1  HGB 12.2*  < > 11.2* 10.8* 10.9* 11.2* 11.1*  HCT 35.6*  < > 33.8* 31.9* 32.7* 34.0* 33.0*  MCV 97.3  < > 98.8 98.5 98.5 98.8 97.3  PLT 85*  < > 71* 69* 75* 100* 103*  < > = values in this interval not displayed. Cardiac Enzymes:  Recent Labs Lab 05/13/13 1030 05/13/13 1604 05/14/13 0930 05/16/13 0527 05/16/13 1350  TROPONINI 1.54* 1.67* 1.11* 0.90* 0.45*   BNP: BNP (last 3 results)  Recent Labs  05/13/13 0055 05/14/13 0930 05/16/13 1403  PROBNP 1389.0* 3540.0* 2658.0*   CBG:  Recent Labs Lab 05/16/13 0731 05/16/13 1148 05/16/13 1716 05/16/13 2111 05/17/13 0719  GLUCAP 133* 192* 160* 217* 155*       Signed:  Carolyne Littles, MD [facility] [phone] pager

## 2013-05-17 NOTE — Progress Notes (Signed)
Pt left at 1130 this morning with his son at his side.  Pt and son received discharge information/prescription and medication handouts and verbalized understanding.  Followup appointments noted. Pt without c/o and alert to discharge.

## 2013-05-19 LAB — CULTURE, BLOOD (ROUTINE X 2)
Culture: NO GROWTH
Culture: NO GROWTH

## 2013-05-20 ENCOUNTER — Emergency Department (HOSPITAL_COMMUNITY): Payer: Medicare Other

## 2013-05-20 ENCOUNTER — Encounter (HOSPITAL_COMMUNITY): Payer: Self-pay | Admitting: Emergency Medicine

## 2013-05-20 ENCOUNTER — Inpatient Hospital Stay (HOSPITAL_COMMUNITY)
Admission: EM | Admit: 2013-05-20 | Discharge: 2013-05-23 | DRG: 193 | Disposition: A | Discharge status: Home / Self Care | Payer: Medicare Other | Attending: Internal Medicine | Admitting: Internal Medicine

## 2013-05-20 DIAGNOSIS — A419 Sepsis, unspecified organism: Secondary | ICD-10-CM | POA: Diagnosis present

## 2013-05-20 DIAGNOSIS — Z951 Presence of aortocoronary bypass graft: Secondary | ICD-10-CM

## 2013-05-20 DIAGNOSIS — A0472 Enterocolitis due to Clostridium difficile, not specified as recurrent: Secondary | ICD-10-CM | POA: Diagnosis not present

## 2013-05-20 DIAGNOSIS — I10 Essential (primary) hypertension: Secondary | ICD-10-CM

## 2013-05-20 DIAGNOSIS — Z8249 Family history of ischemic heart disease and other diseases of the circulatory system: Secondary | ICD-10-CM

## 2013-05-20 DIAGNOSIS — I4892 Unspecified atrial flutter: Secondary | ICD-10-CM | POA: Diagnosis present

## 2013-05-20 DIAGNOSIS — Z87891 Personal history of nicotine dependence: Secondary | ICD-10-CM

## 2013-05-20 DIAGNOSIS — N189 Chronic kidney disease, unspecified: Secondary | ICD-10-CM | POA: Diagnosis present

## 2013-05-20 DIAGNOSIS — E039 Hypothyroidism, unspecified: Secondary | ICD-10-CM | POA: Diagnosis present

## 2013-05-20 DIAGNOSIS — D72829 Elevated white blood cell count, unspecified: Secondary | ICD-10-CM | POA: Diagnosis present

## 2013-05-20 DIAGNOSIS — I129 Hypertensive chronic kidney disease with stage 1 through stage 4 chronic kidney disease, or unspecified chronic kidney disease: Secondary | ICD-10-CM | POA: Diagnosis present

## 2013-05-20 DIAGNOSIS — I4891 Unspecified atrial fibrillation: Secondary | ICD-10-CM | POA: Diagnosis present

## 2013-05-20 DIAGNOSIS — I480 Paroxysmal atrial fibrillation: Secondary | ICD-10-CM

## 2013-05-20 DIAGNOSIS — I214 Non-ST elevation (NSTEMI) myocardial infarction: Secondary | ICD-10-CM

## 2013-05-20 DIAGNOSIS — I252 Old myocardial infarction: Secondary | ICD-10-CM | POA: Diagnosis present

## 2013-05-20 DIAGNOSIS — A0471 Enterocolitis due to Clostridium difficile, recurrent: Secondary | ICD-10-CM

## 2013-05-20 DIAGNOSIS — J189 Pneumonia, unspecified organism: Principal | ICD-10-CM | POA: Diagnosis present

## 2013-05-20 DIAGNOSIS — IMO0001 Reserved for inherently not codable concepts without codable children: Secondary | ICD-10-CM | POA: Diagnosis present

## 2013-05-20 DIAGNOSIS — R0902 Hypoxemia: Secondary | ICD-10-CM

## 2013-05-20 DIAGNOSIS — Z800 Family history of malignant neoplasm of digestive organs: Secondary | ICD-10-CM

## 2013-05-20 DIAGNOSIS — Z66 Do not resuscitate: Secondary | ICD-10-CM | POA: Diagnosis present

## 2013-05-20 DIAGNOSIS — I440 Atrioventricular block, first degree: Secondary | ICD-10-CM | POA: Diagnosis present

## 2013-05-20 DIAGNOSIS — E785 Hyperlipidemia, unspecified: Secondary | ICD-10-CM | POA: Diagnosis present

## 2013-05-20 DIAGNOSIS — R6510 Systemic inflammatory response syndrome (SIRS) of non-infectious origin without acute organ dysfunction: Secondary | ICD-10-CM

## 2013-05-20 DIAGNOSIS — K921 Melena: Secondary | ICD-10-CM

## 2013-05-20 DIAGNOSIS — I2510 Atherosclerotic heart disease of native coronary artery without angina pectoris: Secondary | ICD-10-CM | POA: Diagnosis present

## 2013-05-20 DIAGNOSIS — R509 Fever, unspecified: Secondary | ICD-10-CM

## 2013-05-20 DIAGNOSIS — D649 Anemia, unspecified: Secondary | ICD-10-CM

## 2013-05-20 DIAGNOSIS — E1165 Type 2 diabetes mellitus with hyperglycemia: Secondary | ICD-10-CM

## 2013-05-20 DIAGNOSIS — I5043 Acute on chronic combined systolic (congestive) and diastolic (congestive) heart failure: Secondary | ICD-10-CM | POA: Diagnosis present

## 2013-05-20 DIAGNOSIS — I509 Heart failure, unspecified: Secondary | ICD-10-CM | POA: Diagnosis present

## 2013-05-20 DIAGNOSIS — I4510 Unspecified right bundle-branch block: Secondary | ICD-10-CM | POA: Diagnosis present

## 2013-05-20 DIAGNOSIS — Z833 Family history of diabetes mellitus: Secondary | ICD-10-CM

## 2013-05-20 DIAGNOSIS — D696 Thrombocytopenia, unspecified: Secondary | ICD-10-CM

## 2013-05-20 DIAGNOSIS — L0390 Cellulitis, unspecified: Secondary | ICD-10-CM

## 2013-05-20 DIAGNOSIS — Z79899 Other long term (current) drug therapy: Secondary | ICD-10-CM

## 2013-05-20 DIAGNOSIS — I359 Nonrheumatic aortic valve disorder, unspecified: Secondary | ICD-10-CM | POA: Diagnosis present

## 2013-05-20 LAB — POCT I-STAT, CHEM 8
BUN: 35 mg/dL — ABNORMAL HIGH (ref 6–23)
Calcium, Ion: 1.07 mmol/L — ABNORMAL LOW (ref 1.13–1.30)
Chloride: 98 mEq/L (ref 96–112)
Creatinine, Ser: 1.6 mg/dL — ABNORMAL HIGH (ref 0.50–1.35)
Glucose, Bld: 264 mg/dL — ABNORMAL HIGH (ref 70–99)
HCT: 40 % (ref 39.0–52.0)
Potassium: 4 mEq/L (ref 3.5–5.1)
TCO2: 27 mmol/L (ref 0–100)

## 2013-05-20 LAB — CBC WITH DIFFERENTIAL/PLATELET
Basophils Relative: 0 % (ref 0–1)
Eosinophils Absolute: 0 10*3/uL (ref 0.0–0.7)
Eosinophils Relative: 0 % (ref 0–5)
HCT: 37.3 % — ABNORMAL LOW (ref 39.0–52.0)
Hemoglobin: 12.7 g/dL — ABNORMAL LOW (ref 13.0–17.0)
MCH: 33.3 pg (ref 26.0–34.0)
MCHC: 34 g/dL (ref 30.0–36.0)
MCV: 97.9 fL (ref 78.0–100.0)
Monocytes Absolute: 1.1 10*3/uL — ABNORMAL HIGH (ref 0.1–1.0)
Monocytes Relative: 6 % (ref 3–12)
Neutro Abs: 18.2 10*3/uL — ABNORMAL HIGH (ref 1.7–7.7)
Neutrophils Relative %: 90 % — ABNORMAL HIGH (ref 43–77)
WBC: 20.3 10*3/uL — ABNORMAL HIGH (ref 4.0–10.5)

## 2013-05-20 LAB — URINALYSIS, ROUTINE W REFLEX MICROSCOPIC
Bilirubin Urine: NEGATIVE
Glucose, UA: NEGATIVE mg/dL
Hgb urine dipstick: NEGATIVE
Ketones, ur: NEGATIVE mg/dL
Leukocytes, UA: NEGATIVE
Protein, ur: NEGATIVE mg/dL
pH: 6 (ref 5.0–8.0)

## 2013-05-20 LAB — GLUCOSE, CAPILLARY: Glucose-Capillary: 282 mg/dL — ABNORMAL HIGH (ref 70–99)

## 2013-05-20 LAB — CG4 I-STAT (LACTIC ACID): Lactic Acid, Venous: 3.01 mmol/L — ABNORMAL HIGH (ref 0.5–2.2)

## 2013-05-20 LAB — STREP PNEUMONIAE URINARY ANTIGEN: Strep Pneumo Urinary Antigen: NEGATIVE

## 2013-05-20 MED ORDER — SODIUM CHLORIDE 0.9 % IV BOLUS (SEPSIS): 500.0000 mL | Freq: Once | INTRAVENOUS | Status: DC

## 2013-05-20 MED ORDER — ACETAMINOPHEN 325 MG PO TABS
650.0000 mg | ORAL_TABLET | Freq: Once | ORAL | Status: AC
  Administered 2013-05-20: 650 mg via ORAL
  Filled 2013-05-20: qty 2

## 2013-05-20 MED ORDER — DEXTROSE 5 % IV SOLN
1.0000 g | INTRAVENOUS | Status: DC
  Administered 2013-05-21: 1 g via INTRAVENOUS
  Filled 2013-05-20: qty 1

## 2013-05-20 MED ORDER — VANCOMYCIN HCL IN DEXTROSE 1-5 GM/200ML-% IV SOLN
1000.0000 mg | Freq: Once | INTRAVENOUS | Status: AC
  Administered 2013-05-20: 1000 mg via INTRAVENOUS
  Filled 2013-05-20: qty 200

## 2013-05-20 MED ORDER — LORAZEPAM 0.5 MG PO TABS
0.5000 mg | ORAL_TABLET | Freq: Every evening | ORAL | Status: DC | PRN
  Administered 2013-05-20 – 2013-05-23 (×2): 0.5 mg via ORAL
  Filled 2013-05-20 (×2): qty 1

## 2013-05-20 MED ORDER — VANCOMYCIN HCL 500 MG IV SOLR
500.0000 mg | INTRAVENOUS | Status: AC
  Administered 2013-05-20: 500 mg via INTRAVENOUS
  Filled 2013-05-20 (×2): qty 500

## 2013-05-20 MED ORDER — INSULIN ASPART 100 UNIT/ML ~~LOC~~ SOLN
0.0000 [IU] | Freq: Three times a day (TID) | SUBCUTANEOUS | Status: DC
  Administered 2013-05-20: 5 [IU] via SUBCUTANEOUS
  Administered 2013-05-20: 3 [IU] via SUBCUTANEOUS
  Administered 2013-05-21: 12:00:00 9 [IU] via SUBCUTANEOUS
  Administered 2013-05-21: 09:00:00 3 [IU] via SUBCUTANEOUS
  Administered 2013-05-21 – 2013-05-22 (×2): 5 [IU] via SUBCUTANEOUS
  Administered 2013-05-22: 18:00:00 3 [IU] via SUBCUTANEOUS
  Administered 2013-05-22 – 2013-05-23 (×2): 1 [IU] via SUBCUTANEOUS

## 2013-05-20 MED ORDER — LEVOTHYROXINE SODIUM 50 MCG PO TABS
50.0000 ug | ORAL_TABLET | Freq: Every day | ORAL | Status: DC
  Administered 2013-05-20 – 2013-05-23 (×4): 50 ug via ORAL
  Filled 2013-05-20 (×5): qty 1

## 2013-05-20 MED ORDER — DEXTROSE 5 % IV SOLN: 1.0000 g | Freq: Three times a day (TID) | INTRAVENOUS | Status: DC

## 2013-05-20 MED ORDER — CEFEPIME HCL 1 G IJ SOLR
1.0000 g | INTRAMUSCULAR | Status: AC
  Administered 2013-05-20: 1 g via INTRAVENOUS
  Filled 2013-05-20: qty 1

## 2013-05-20 MED ORDER — GEMFIBROZIL 600 MG PO TABS
600.0000 mg | ORAL_TABLET | Freq: Two times a day (BID) | ORAL | Status: DC
  Administered 2013-05-20 – 2013-05-23 (×6): 600 mg via ORAL
  Filled 2013-05-20 (×8): qty 1

## 2013-05-20 MED ORDER — SODIUM CHLORIDE 0.9 % IV BOLUS (SEPSIS)
500.0000 mL | INTRAVENOUS | Status: AC
  Administered 2013-05-20: 500 mL via INTRAVENOUS

## 2013-05-20 MED ORDER — SODIUM CHLORIDE 0.9 % IV BOLUS (SEPSIS)
500.0000 mL | Freq: Once | INTRAVENOUS | Status: AC
  Administered 2013-05-20: 14:00:00 500 mL via INTRAVENOUS

## 2013-05-20 MED ORDER — MIRTAZAPINE 15 MG PO TABS
15.0000 mg | ORAL_TABLET | Freq: Every day | ORAL | Status: DC
  Administered 2013-05-20 – 2013-05-22 (×3): 15 mg via ORAL
  Filled 2013-05-20 (×4): qty 1

## 2013-05-20 MED ORDER — SACCHAROMYCES BOULARDII 250 MG PO CAPS
250.0000 mg | ORAL_CAPSULE | Freq: Two times a day (BID) | ORAL | Status: DC
  Administered 2013-05-20 – 2013-05-23 (×6): 250 mg via ORAL
  Filled 2013-05-20 (×7): qty 1

## 2013-05-20 MED ORDER — VANCOMYCIN HCL IN DEXTROSE 1-5 GM/200ML-% IV SOLN
1000.0000 mg | INTRAVENOUS | Status: DC
  Administered 2013-05-21: 1000 mg via INTRAVENOUS
  Filled 2013-05-20: qty 200

## 2013-05-20 NOTE — H&P (Signed)
[facility] History and Physical  MUAD NOGA ZOX:[id number] DOB: [birth year] DOA: 05/20/2013  Referring physician: Dr. Elesa Massed PCP: [name], MD  Specialists: none  Chief Complaint: DOE for 2 days  HPI: David Davidson is a 79 y.o. male has a past medical history significant for CHF (last 2D echo 05/13/2013 with EF 35-40% and restrictive physiology, severe Aortic stenosis), DM, CAD, recent NSTEMI during his prior hospitalization last week, recent cellulitis, paroxysmal A fib previously on Coumadin stopped due to GI Bleed, chronic thrombocytopenia, recently discharge 3 days ago, presents to the ED with a chief complaint of progressive SOB and DOE in the past 2-3 days. He denies fever/chills, denies chest pain, has no cough/sputum production. Endorses feeling weak and without energy, particularly worse today. Denies dysuria, denies abdominal pain, nausea/vomiting or diarrhea.   Review of Systems: as per HPI otherwise negative  Past Medical History  Diagnosis Date  . Myocardial infarct   . Diabetes mellitus   . Hypertension   . CHF (congestive heart failure)   . Coronary artery disease   . Clostridium difficile diarrhea     recurrent   . Hyperlipidemia   . Pneumonia 04/2011  . Shortness of breath     only with my heart failure "  . Chronic kidney disease   . Dysrhythmia   . Paroxysmal atrial fibrillation    Past Surgical History  Procedure Laterality Date  . Coronary artery bypass graft    . Cholecystectomy    . Cardiac catheterization  10/10/2001  . Cystoscopy, turbt  05/09/2006, 02/19/2006, 04/19/2005, 04/21/2003  . Colonoscopy  01/31/2012    Procedure: COLONOSCOPY;  Surgeon: Iva Boop, MD;  Location: [facility];  Service: Endoscopy;  Laterality: N/A;  fecal transplant/cynthia snyder will prepare the sample   Social History:  reports that he quit smoking about 39 years ago. His smoking use included Cigarettes. He smoked 1.00 pack per day. He has never used  smokeless tobacco. He reports that he does not drink alcohol or use illicit drugs.  Allergies  Allergen Reactions  . Morphine And Related Other (See Comments)    Reaction unknown    Family History  Problem Relation Age of Onset  . Stomach cancer Mother   . Coronary artery disease Father   . Diabetes Son   . Colon cancer Neg Hx    Prior to Admission medications   Medication Sig Start Date End Date Taking? Authorizing Provider  diphenhydramine-acetaminophen (TYLENOL PM) 25-500 MG TABS Take 1 tablet by mouth at bedtime as needed (sleep).   Yes Historical Provider, MD  ezetimibe (ZETIA) 10 MG tablet Take 10 mg by mouth at bedtime.    Yes Historical Provider, MD  finasteride (PROSCAR) 5 MG tablet Take 5 mg by mouth every morning.    Yes Historical Provider, MD  fish oil-omega-3 fatty acids 1000 MG capsule Take 1 g by mouth every morning.    Yes Historical Provider, MD  furosemide (LASIX) 20 MG tablet Take 1 tablet (20 mg total) by mouth every evening. 05/17/13  Yes Drema Dallas, MD  furosemide (LASIX) 40 MG tablet Take 1 tablet (40 mg total) by mouth every morning. 05/17/13  Yes Drema Dallas, MD  gemfibrozil (LOPID) 600 MG tablet Take 600 mg by mouth 2 (two) times daily before a meal.    Yes Historical Provider, MD  glyBURIDE (DIABETA) 5 MG tablet Take 2.5-5 mg by mouth daily as needed (high blood sugar).    Yes Antonieta Pert, MD  ibuprofen (ADVIL,MOTRIN) 200 MG tablet Take 200 mg by mouth every 6 (six) hours as needed for pain.   Yes Historical Provider, MD  levothyroxine (SYNTHROID, LEVOTHROID) 50 MCG tablet Take 50 mcg by mouth daily before breakfast.   Yes Historical Provider, MD  LORazepam (ATIVAN) 0.5 MG tablet Take 0.5 mg by mouth at bedtime as needed. For insomnia.   Yes Historical Provider, MD  mirtazapine (REMERON) 15 MG tablet Take 15 mg by mouth at bedtime.   Yes Historical Provider, MD  Multiple Vitamin (MULTIVITAMIN WITH MINERALS) TABS Take 1 tablet by mouth daily.   Yes  Historical Provider, MD  Multiple Vitamins-Minerals (ICAPS PO) Take 1 capsule by mouth daily.    Yes Historical Provider, MD  potassium chloride (K-DUR,KLOR-CON) 10 MEQ tablet Take 10 mEq by mouth 3 (three) times daily.   Yes Historical Provider, MD   Physical Exam: Filed Vitals:   05/20/13 0730 05/20/13 0745 05/20/13 0800 05/20/13 0815  BP: 100/60 112/56 102/79 110/75  Pulse: 112 113 111   Temp:      TempSrc:      Resp: 20 25 24 23   SpO2: 97% 97% 96%      General:  No apparent distress, drowsy  Eyes: no scleral icterus  ENT: dry oropharynx  Neck: supple, no JVD  Cardiovascular: regular rate without MRG; 2+ peripheral pulses  Respiratory: mild crackles, no wheezing  Abdomen: soft, non tender to palpation, positive bowel sounds, no guarding, no rebound  Skin: no rashes  Musculoskeletal: no peripheral edema  Psychiatric: normal mood and affect  Neurologic: non focal  Labs on Admission:  Basic Metabolic Panel:  Recent Labs Lab 05/13/13 1428 05/14/13 0444 05/15/13 0420 05/16/13 0527 05/17/13 0530 05/20/13 0619  NA 139 137 141 141 138 140  K 3.8 3.6 3.6 3.4* 3.2* 4.0  CL 104 104 106 104 99 98  CO2 26 23 25 27 29   --   GLUCOSE 157* 147* 159* 142* 169* 264*  BUN 20 19 17 15 22  35*  CREATININE 1.22 1.20 1.25 1.17 1.46* 1.60*  CALCIUM 8.2* 8.2* 8.4 8.2* 8.6  --   MG  --   --   --   --  2.3  --    Liver Function Tests:  Recent Labs Lab 05/17/13 0530  AST 22  ALT 17  ALKPHOS 47  BILITOT 0.9  PROT 7.4  ALBUMIN 3.3*   No results found for this basename: LIPASE, AMYLASE,  in the last 168 hours No results found for this basename: AMMONIA,  in the last 168 hours CBC:  Recent Labs Lab 05/14/13 0444 05/15/13 0420 05/16/13 0527 05/17/13 0530 05/20/13 0606 05/20/13 0619  WBC 7.7 7.8 5.9 6.2 20.3*  --   NEUTROABS  --   --   --  3.1 18.2*  --   HGB 10.8* 10.9* 11.2* 11.1* 12.7* 13.6  HCT 31.9* 32.7* 34.0* 33.0* 37.3* 40.0  MCV 98.5 98.5 98.8 97.3 97.9   --   PLT 69* 75* 100* 103* 159  --    Cardiac Enzymes:  Recent Labs Lab 05/13/13 1604 05/14/13 0930 05/16/13 0527 05/16/13 1350 05/20/13 0606  TROPONINI 1.67* 1.11* 0.90* 0.45* <0.30    BNP (last 3 results)  Recent Labs  05/14/13 0930 05/16/13 1403 05/20/13 0606  PROBNP 3540.0* 2658.0* 1575.0*   CBG:  Recent Labs Lab 05/16/13 0731 05/16/13 1148 05/16/13 1716 05/16/13 2111 05/17/13 0719  GLUCAP 133* 192* 160* 217* 155*    Radiological Exams on Admission: Dg Chest 2  View  05/20/2013   CLINICAL DATA:  Shortness of breath.  EXAM: CHEST  2 VIEW  COMPARISON:  Chest radiograph May 13, 2013  FINDINGS: The cardiac silhouette appears mildly enlarged, improved from prior examination. Status post median sternotomy for apparent Coronary artery bypass grafting. Mild central pulmonary vasculature congestion, decreased. Bandlike densities in left lung base. No pleural effusions. No pneumothorax.  Multiple EKG lines overlie the patient and may obscure subtle underlying pathology. Soft tissue planes and included osseous structures are nonsuspicious.  IMPRESSION: Improved appearance of chest: Decreased cardiomegaly, with mild central pulmonary vasculature congestion, left lung base atelectasis.   Electronically Signed   By: Awilda Metro   On: 05/20/2013 06:13    EKG: Independently reviewed.  Assessment/Plan Principal Problem:   HCAP (healthcare-associated pneumonia) Active Problems:   CAD (coronary artery disease)   Paroxysmal a-fib   DM (diabetes mellitus), type 2, uncontrolled with complications   Hypertension   S/P CABG (coronary artery bypass graft)   Hyperlipidemia   Hypothyroidism   SIRS (systemic inflammatory response syndrome)   Acute on chronic combined systolic and diastolic heart failure   HCAP - Vanc/cefepime. The patient recently hospitalized, will cover for hospital-acquired pneumonia. His respiratory status stable 2 L nasal cannula. Paroxismal A fib -  Coumadin was stopped recently secondary to GI bleed. CAD - s/p CABG, NSTEMI last hospitalization.  CHF - hold Lasix for now, restart as appropriate. Of note, his weight on discharge 3 days ago was 212 pounds, and right now, checked twice, is around 194. Suspect severe dehydration in the last couple of days due to poor by mouth intake in the setting of weakness as the patient lives alone.  DM - SSI Hypothyroidism - continue Synthroid HTN - hold medications Goals of care - his case was discussed in detail with his son and his daughter. He came in with hypotension, elevated white count, possible sepsis presumably of lung origin in the left lung base, on a baseline of significant heart failure with a depressed ejection fraction as well as diastolic dysfunction in the setting of aortic stenosis. We'll try IV antibiotics, IV fluids to augment blood pressure, however family is very understandable that he is critically ill at this point. If he is to decompensate, his son [name] [phone], C [phone]) would like to be notified. Based on my discussions with him, would avoid invasive or escalating aggressive measures, and focus on comfort if that'll be the case tonight.  Diet: heart  Fluids:  DVT Prophylaxis:  Code Status: DO NOT RESUSCITATE  Family Communication: Son and daughter  Disposition Plan: Inpatient  Time spent: 32  Costin M. Elvera Lennox, MD [facility] Pager [phone]  If 7PM-7AM, please contact night-coverage [email or web address] Password [facility] 05/20/2013, 8:39 AM

## 2013-05-20 NOTE — ED Provider Notes (Signed)
Medical screening examination/treatment/procedure(s) were conducted as a shared visit with non-physician practitioner(s) and myself.  I personally evaluated the patient during the encounter.    Date: 05/20/2013   Rate: 122  Rhythm: atrial flutter with 2:1 AV block  QRS Axis: normal  Intervals: Right bundle branch block  ST/T Wave abnormalities: normal  Conduction Disutrbances: none  Narrative Interpretation: A flutter, right bundle branch block, no ST depression in anterior leads     Patient is a 79 year old male with history of CHF, A. fib, hypothyroidism, hypertension, diabetes, hyperlipidemia, chronic kidney disease, CAD who presents emergency department with shortness of breath for the past 2 days. Worse with exertion. Denies any fever or cough. No chest pain or chest discomfort. No lower extremity swelling or pain. Patient was recently admitted to the hospital for similar symptoms and had lower extremity cellulitis. In the ED he is tachycardic with soft blood pressures. His temperature is 99.7 orally. Oxygen saturation 91% on room air. He has crackles at his bases bilaterally. No rhonchi or wheezing. He has a systolic murmur appreciated. Abdomen is soft and nontender. No JVD or peripheral edema on exam. Suspect CHF exacerbation versus pneumonia. Patient will likely need admission given his new oxygen requirement.   Patient has a rectal temperature of 100.9 and is tachycardic with a heart rate of 120s. He has mildly soft blood pressures. He is a leukocytosis of 20.3. Chest x-ray questions left lower lobe atelectasis. Suspect this may be a new infiltrate. Will give IV fluids and broad-spectrum antibiotics for healthcare associated pneumonia. Urinalysis and blood cultures pending. Patient will need admission.  Layla Maw Magic Mohler, DO 05/20/13 [phone]

## 2013-05-20 NOTE — Progress Notes (Signed)
Bladder scan residual 323 ml.  Pt has not voided for 8 hours.  MD notified.  New orders received to insert foley.

## 2013-05-20 NOTE — ED Provider Notes (Signed)
CSN: [id number]     Arrival date & time 05/20/13  0457 History   First MD Initiated Contact with Patient 05/20/13 [phone]     Chief Complaint  Patient presents with  . Shortness of Breath   (Consider location/radiation/quality/duration/timing/severity/associated sxs/prior Treatment) HPI Comments: David Davidson, is 79 years old, lives alone to use the restroom walk back to his bed, being extremely short of breath, and increased weakness.  He recently was admitted to the hospital for cellulitis.  Heart failure, and discharged after 4 days has been doing well reports, that he's been eating, and drinking per norm.  His falls, chest pain fevers cough.  Although he does report that his blood sugars have been elevated in the 200 range.  Normally hs is  100-160  Patient is a 79 y.o. male presenting with shortness of breath. The history is provided by the patient.  Shortness of Breath Severity:  Moderate Onset quality:  Gradual Progression:  Worsening Chronicity:  Recurrent Relieved by:  Nothing Associated symptoms: no abdominal pain, no chest pain, no cough, no ear pain and no fever     Past Medical History  Diagnosis Date  . Myocardial infarct   . Diabetes mellitus   . Hypertension   . CHF (congestive heart failure)   . Coronary artery disease   . Clostridium difficile diarrhea     recurrent   . Hyperlipidemia   . Pneumonia 04/2011  . Shortness of breath     only with my heart failure "  . Chronic kidney disease   . Dysrhythmia   . Paroxysmal atrial fibrillation    Past Surgical History  Procedure Laterality Date  . Coronary artery bypass graft    . Cholecystectomy    . Cardiac catheterization  10/10/2001  . Cystoscopy, turbt  05/09/2006, 02/19/2006, 04/19/2005, 04/21/2003  . Colonoscopy  01/31/2012    Procedure: COLONOSCOPY;  Surgeon: Iva Boop, MD;  Location: [facility];  Service: Endoscopy;  Laterality: N/A;  fecal transplant/cynthia snyder will prepare the sample   Family  History  Problem Relation Age of Onset  . Stomach cancer Mother   . Coronary artery disease Father   . Diabetes Son   . Colon cancer Neg Hx    History  Substance Use Topics  . Smoking status: Former Smoker -- 1.00 packs/day    Types: Cigarettes    Quit date: 05/08/1974  . Smokeless tobacco: Never Used  . Alcohol Use: No     Comment: occasionally    Review of Systems  Constitutional: Negative for fever.  HENT: Negative for ear pain.   Respiratory: Positive for shortness of breath. Negative for cough.   Cardiovascular: Negative for chest pain and leg swelling.  Gastrointestinal: Negative for abdominal pain.  Musculoskeletal: Negative for joint swelling.  Skin: Positive for pallor. Negative for wound.  Neurological: Positive for weakness.  All other systems reviewed and are negative.    Allergies  Morphine and related  Home Medications   No current outpatient prescriptions on file. BP 106/60  Pulse 80  Temp(Src) 98.2 F (36.8 C) (Oral)  Resp 18  Ht 6\' 1"  (1.854 m)  Wt 194 lb 3.6 oz (88.1 kg)  BMI 25.63 kg/m2  SpO2 99% Physical Exam  Nursing note and vitals reviewed. Constitutional: He is oriented to person, place, and time. He appears well-developed and well-nourished.  HENT:  Head: Normocephalic.  Eyes: Pupils are equal, round, and reactive to light.  Neck: Normal range of motion.  Cardiovascular: Regular rhythm.  Tachycardia present.   Pulmonary/Chest: Effort normal. No respiratory distress. He has no wheezes. He exhibits no tenderness.  Saturation on room air were 91%.  Patient is not oxygen dependent at home  Abdominal: Soft. Bowel sounds are normal. He exhibits no distension.  Musculoskeletal: Normal range of motion. He exhibits no edema and no tenderness.  Neurological: He is alert and oriented to person, place, and time.  Skin: Skin is warm and dry. There is pallor.    ED Course  Procedures (including critical care time) Labs Review Labs Reviewed   CLOSTRIDIUM DIFFICILE BY PCR - Abnormal; Notable for the following:    C difficile by pcr POSITIVE (*)    All other components within normal limits  CBC WITH DIFFERENTIAL - Abnormal; Notable for the following:    WBC 20.3 (*)    RBC 3.81 (*)    Hemoglobin 12.7 (*)    HCT 37.3 (*)    Neutrophils Relative % 90 (*)    Neutro Abs 18.2 (*)    Lymphocytes Relative 5 (*)    Monocytes Absolute 1.1 (*)    All other components within normal limits  URINALYSIS, ROUTINE W REFLEX MICROSCOPIC - Abnormal; Notable for the following:    APPearance CLOUDY (*)    All other components within normal limits  PRO B NATRIURETIC PEPTIDE - Abnormal; Notable for the following:    Pro B Natriuretic peptide (BNP) 1575.0 (*)    All other components within normal limits  GLUCOSE, CAPILLARY - Abnormal; Notable for the following:    Glucose-Capillary 282 (*)    All other components within normal limits  CBC - Abnormal; Notable for the following:    WBC 15.7 (*)    RBC 3.41 (*)    Hemoglobin 11.3 (*)    HCT 33.4 (*)    Platelets 139 (*)    All other components within normal limits  BASIC METABOLIC PANEL - Abnormal; Notable for the following:    Glucose, Bld 247 (*)    BUN 39 (*)    Creatinine, Ser 1.40 (*)    GFR calc non Af Amer 40 (*)    GFR calc Af Amer 46 (*)    All other components within normal limits  GLUCOSE, CAPILLARY - Abnormal; Notable for the following:    Glucose-Capillary 211 (*)    All other components within normal limits  GLUCOSE, CAPILLARY - Abnormal; Notable for the following:    Glucose-Capillary 239 (*)    All other components within normal limits  GLUCOSE, CAPILLARY - Abnormal; Notable for the following:    Glucose-Capillary 211 (*)    All other components within normal limits  GLUCOSE, CAPILLARY - Abnormal; Notable for the following:    Glucose-Capillary 372 (*)    All other components within normal limits  GLUCOSE, CAPILLARY - Abnormal; Notable for the following:     Glucose-Capillary 260 (*)    All other components within normal limits  CBC - Abnormal; Notable for the following:    WBC 13.7 (*)    RBC 3.32 (*)    Hemoglobin 10.9 (*)    HCT 32.1 (*)    Platelets 130 (*)    All other components within normal limits  BASIC METABOLIC PANEL - Abnormal; Notable for the following:    Glucose, Bld 159 (*)    BUN 38 (*)    GFR calc non Af Amer 49 (*)    GFR calc Af Amer 57 (*)    All other components within normal  limits  GLUCOSE, CAPILLARY - Abnormal; Notable for the following:    Glucose-Capillary 167 (*)    All other components within normal limits  GLUCOSE, CAPILLARY - Abnormal; Notable for the following:    Glucose-Capillary 143 (*)    All other components within normal limits  GLUCOSE, CAPILLARY - Abnormal; Notable for the following:    Glucose-Capillary 289 (*)    All other components within normal limits  GLUCOSE, CAPILLARY - Abnormal; Notable for the following:    Glucose-Capillary 182 (*)    All other components within normal limits  POCT I-STAT, CHEM 8 - Abnormal; Notable for the following:    BUN 35 (*)    Creatinine, Ser 1.60 (*)    Glucose, Bld 264 (*)    Calcium, Ion 1.07 (*)    All other components within normal limits  CG4 I-STAT (LACTIC ACID) - Abnormal; Notable for the following:    Lactic Acid, Venous 3.01 (*)    All other components within normal limits  CULTURE, BLOOD (ROUTINE X 2)  CULTURE, BLOOD (ROUTINE X 2)  CULTURE, EXPECTORATED SPUTUM-ASSESSMENT  GRAM STAIN  TROPONIN I  LEGIONELLA ANTIGEN, URINE  STREP PNEUMONIAE URINARY ANTIGEN   Imaging Review No results found.  EKG Interpretation    Date/Time:  Tuesday May 20 2013 06:30:01 EST Ventricular Rate:  122 PR Interval:    QRS Duration: 144 QT Interval:  382 QTC Calculation: 544 R Axis:   32 Text Interpretation:  Atrial flutter with 2:1 AV block Right bundle branch block ST depression, consider ischemia, diffuse lds ED PHYSICIAN INTERPRETATION AVAILABLE  IN [facility] Confirmed by TEST, RECORD ([id number]) on 05/22/2013 11:10:05 AM            MDM   1. Healthcare-associated pneumonia   2. Hypoxia   3. Fever   4. Leukocytosis   5. Sepsis   6. Recurrent colitis due to Clostridium difficile   7. DM (diabetes mellitus), type 2, uncontrolled with complications   8. Hypothyroidism   9. Hypertension       Arman Filter, NP 05/22/13 2235

## 2013-05-20 NOTE — Progress Notes (Signed)
Pt BP 87/49 HR 103. MD notified.  Will continue to monitor.

## 2013-05-20 NOTE — ED Notes (Signed)
Per EMS pt got up to go to the restroom and when he went back to bed he was feeling short of breath  Lung sounds clear bilaterally Able to speak in full sentences, no apparent distress  Pt placed on oxygen at 2L/min via [place]  Pt states he wants to speak to his dr to get home oxygen  Pt has an appt with his dr at 10 this morning but the family wanted him to be seen here Son at bedside

## 2013-05-20 NOTE — Progress Notes (Signed)
ANTIBIOTIC CONSULT NOTE - INITIAL  Pharmacy Consult for Cefepime/Vancomycin Indication: pneumonia  Allergies  Allergen Reactions  . Morphine And Related Other (See Comments)    Reaction unknown    Patient Measurements:   Body Weight: 96 kg  Vital Signs: Temp: 100.9 F (38.3 C) (12/09 4098) Temp src: Rectal (12/09 0608) BP: 110/75 mmHg (12/09 0815) Pulse Rate: 111 (12/09 0800) Intake/Output from previous day:   Intake/Output from this shift:    Labs:  Recent Labs  05/20/13 0606 05/20/13 0619  WBC 20.3*  --   HGB 12.7* 13.6  PLT 159  --   CREATININE  --  1.60*   The CrCl is unknown because both a height and weight (above a minimum accepted value) are required for this calculation.   Microbiology: Recent Results (from the past 720 hour(s))  CULTURE, BLOOD (ROUTINE X 2)     Status: None   Collection Time    05/13/13 12:55 AM      Result Value Range Status   Specimen Description BLOOD RIGHT HAND   Final   Special Requests BOTTLES DRAWN AEROBIC AND ANAEROBIC    Final   Culture  Setup Time     Final   Value: 05/13/2013 04:24     Performed at [facility]   Culture     Final   Value: NO GROWTH 5 DAYS     Performed at [facility]   Report Status 05/19/2013 FINAL   Final  CULTURE, BLOOD (ROUTINE X 2)     Status: None   Collection Time    05/13/13  1:15 AM      Result Value Range Status   Specimen Description BLOOD LEFT HAND   Final   Special Requests BOTTLES DRAWN AEROBIC AND ANAEROBIC    Final   Culture  Setup Time     Final   Value: 05/13/2013 04:24     Performed at [facility]   Culture     Final   Value: NO GROWTH 5 DAYS     Performed at [facility]   Report Status 05/19/2013 FINAL   Final  MRSA PCR SCREENING     Status: Abnormal   Collection Time    05/13/13 12:54 PM      Result Value Range Status   MRSA by PCR POSITIVE (*) NEGATIVE Final   Comment:            The GeneXpert MRSA Assay (FDA     approved  for NASAL specimens     only), is one component of a     comprehensive MRSA colonization     surveillance program. It is not     intended to diagnose MRSA     infection nor to guide or     monitor treatment for     MRSA infections.     RESULT CALLED TO, READ BACK BY AND VERIFIED WITHChancy Milroy RN [id number] 05/13/13 A NAVARRO    Medical History: Past Medical History  Diagnosis Date  . Myocardial infarct   . Diabetes mellitus   . Hypertension   . CHF (congestive heart failure)   . Coronary artery disease   . Clostridium difficile diarrhea     recurrent   . Hyperlipidemia   . Pneumonia 04/2011  . Shortness of breath     only with my heart failure "  . Chronic kidney disease   . Dysrhythmia   . Paroxysmal  atrial fibrillation      Assessment: 89 yoM with PMH of CHF, afib, HTN, DM, HLD, CKD, CAD presents to ED with SOB.  Recent admission for similar symptoms and LE cellulitis (received Vanc/Zosyn then switched to PO Levaquin, completed 12/6).  Patient found to have fever with tachycardia, soft BPs, leukocytosis and CXR with possible left lower lobe infiltrate.  Starting empiric antibiotics for 8 days for suspected HCAP. Vancomycin 1g IV x 1 and Cefepime 1g IV x 1 given in ED.  Pharmacy consulted to dose Cefepime and Vancomycin.  WBC 20.3  SCr 1.60, CrCl ~24 ml/min (normalized), ~32 ml/min (CG)  Goal of Therapy:  Vancomycin trough level 15-20 mcg/ml  Plan:  1.  Cefepime 1g IV q24h. 2.  Vancomycin 500 mg IV x 1 (in addition to 1g dose given in ED for a total 1500 mg loading dose) then 1g IV q24h. 3.  F/u SCr and trough levels for dose adjustments.  Clance Boll 05/20/2013,8:29 AM

## 2013-05-20 NOTE — Progress Notes (Signed)
    CARE MANAGEMENT ED NOTE 05/20/2013  Patient:  David Davidson, David Davidson   Account Number:  [id number]  Date Initiated:  05/20/2013  Documentation initiated by:  Melinda Crutch  Subjective/Objective Assessment:   76 male came into ED c/o shortness of breath.  Patient report of mental status changes.  Patient was placed on 2 liters of oxygen. Patient sat less than 91% before placed on oxygen and was able to keep oxygen sat level 91% and greater.     Subjective/Objective Assessment Detail:   Temp 100.9 rectal Heart rate 106-133 and bp lowest range 96/51. Patient was given NS 500 IV Vanc, Maxipime given. and Tylenol PO meds administered. Labs abnormal WBC 20.3 BUN 35 Creatinine 1.60. Patient [facility]. This will be a readmission for patient since last admission on 05/13/13.     Action/Plan:   Action/Plan Detail:   Anticipated DC Date:  05/23/2013     Status Recommendation to Physician:   Result of Recommendation:    Other ED Services  Consult Working Plan    DC Planning Services  Other    Choice offered to / List presented to:            Status of service:  Completed, signed off  ED Comments:   ED Comments Detail:

## 2013-05-20 NOTE — ED Notes (Signed)
Bed: ZO10 Expected date: 05/20/13 Expected time: 4:40 AM Means of arrival: Ambulance Comments: 79 yo M  Shortness of breath

## 2013-05-21 DIAGNOSIS — E1165 Type 2 diabetes mellitus with hyperglycemia: Secondary | ICD-10-CM

## 2013-05-21 DIAGNOSIS — L02419 Cutaneous abscess of limb, unspecified: Secondary | ICD-10-CM

## 2013-05-21 DIAGNOSIS — I10 Essential (primary) hypertension: Secondary | ICD-10-CM

## 2013-05-21 DIAGNOSIS — E039 Hypothyroidism, unspecified: Secondary | ICD-10-CM

## 2013-05-21 DIAGNOSIS — A0472 Enterocolitis due to Clostridium difficile, not specified as recurrent: Secondary | ICD-10-CM

## 2013-05-21 LAB — LEGIONELLA ANTIGEN, URINE: Legionella Antigen, Urine: NEGATIVE

## 2013-05-21 LAB — GLUCOSE, CAPILLARY
Glucose-Capillary: 211 mg/dL — ABNORMAL HIGH (ref 70–99)
Glucose-Capillary: 372 mg/dL — ABNORMAL HIGH (ref 70–99)
Glucose-Capillary: 260 mg/dL — ABNORMAL HIGH (ref 70–99)

## 2013-05-21 LAB — CBC
HCT: 33.4 % — ABNORMAL LOW (ref 39.0–52.0)
MCHC: 33.8 g/dL (ref 30.0–36.0)
MCV: 97.9 fL (ref 78.0–100.0)
Platelets: 139 10*3/uL — ABNORMAL LOW (ref 150–400)
RBC: 3.41 MIL/uL — ABNORMAL LOW (ref 4.22–5.81)
RDW: 14.3 % (ref 11.5–15.5)

## 2013-05-21 LAB — BASIC METABOLIC PANEL
BUN: 39 mg/dL — ABNORMAL HIGH (ref 6–23)
Creatinine, Ser: 1.4 mg/dL — ABNORMAL HIGH (ref 0.50–1.35)
GFR calc Af Amer: 46 mL/min — ABNORMAL LOW (ref >90)
GFR calc non Af Amer: 40 mL/min — ABNORMAL LOW (ref >90)

## 2013-05-21 MED ORDER — MUPIROCIN 2 % EX OINT
1.0000 "application " | TOPICAL_OINTMENT | Freq: Two times a day (BID) | CUTANEOUS | Status: DC
  Administered 2013-05-21 – 2013-05-23 (×6): 1 via NASAL
  Filled 2013-05-21: qty 22

## 2013-05-21 MED ORDER — METRONIDAZOLE 500 MG PO TABS
500.0000 mg | ORAL_TABLET | Freq: Three times a day (TID) | ORAL | Status: DC
  Administered 2013-05-21: 15:00:00 500 mg via ORAL
  Filled 2013-05-21 (×3): qty 1

## 2013-05-21 MED ORDER — CHLORHEXIDINE GLUCONATE CLOTH 2 % EX PADS
6.0000 | MEDICATED_PAD | Freq: Every day | CUTANEOUS | Status: DC
  Administered 2013-05-21 – 2013-05-22 (×2): 6 via TOPICAL

## 2013-05-21 MED ORDER — VANCOMYCIN 50 MG/ML ORAL SOLUTION
500.0000 mg | Freq: Four times a day (QID) | ORAL | Status: DC
  Administered 2013-05-21 – 2013-05-23 (×7): 500 mg via ORAL
  Filled 2013-05-21 (×12): qty 10

## 2013-05-21 MED ORDER — HYDROCODONE-ACETAMINOPHEN 5-325 MG PO TABS
1.0000 | ORAL_TABLET | Freq: Four times a day (QID) | ORAL | Status: DC | PRN
  Administered 2013-05-21: 1 via ORAL
  Filled 2013-05-21: qty 1

## 2013-05-21 NOTE — Progress Notes (Addendum)
Inpatient Diabetes Program Recommendations  AACE/ADA: New Consensus Statement on Inpatient Glycemic Control (2013)  Target Ranges:  Prepandial:   less than 140 mg/dL      Peak postprandial:   less than 180 mg/dL (1-2 hours)      Critically ill patients:  140 - 180 mg/dL   Reason for Visit: Results for BESSIE, LIVINGOOD (MRN [id number]) as of 05/21/2013 13:15  Ref. Range 05/20/2013 12:51 05/20/2013 16:50 05/20/2013 21:53 05/21/2013 07:27 05/21/2013 11:34  Glucose-Capillary Latest Range: 70-99 mg/dL 409 (H) 811 (H) 914 (H) 211 (H) 372 (H)   Note CBG's increasing.  If appropriate to plan of care, consider adding Lantus 10 units daily and Novolog meal coverage 3 units tid with meals-Hold if patient eats less than 50%.    Thanks, Beryl Meager, RN, BC-ADM Inpatient Diabetes Coordinator Pager [phone]

## 2013-05-21 NOTE — Progress Notes (Signed)
[facility] PROGRESS NOTE  David Davidson NFA:[id number] DOB: 26-Feb-1911 DOA: 05/20/2013 PCP: [name], MD  Assessment/Plan: HCAP-The patient recently hospitalized  - Continue Vanc/cefepime, wbc trending down -cultures with no growth so far, follow and descalate abx if continues to improve -continue 2 L nasal cannula.  C. 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  CAD - s/p CABG, NSTEMI last hospitalization, repeat EKG overnight with no acute changes and pt CP free today.  CHF - continue holding Lasix for now, as clinically dehydrated, monitor volume status closely and follow and resume when clinically appropriate.  DM - SSI  Hypothyroidism - continue Synthroid  HTN - hold medications  Goals of care - his case was discussed in detail with his son and his daughterper Dr Lafe Garin on admission. Family agreed to IV antibiotics, IV fluids to augment blood pressure, however family is very understandable that he is critically ill at this point. If he is to decompensate, his son David Davidson [phone], C [phone]) would like to be notified. Based on Dr Charlean Sanfilippo discussions with him, would avoid invasive or escalating aggressive measures, and focus on comfort if pt was to decline.     Code Status: DNR Family Communication: Daughter at bedside  Disposition Plan: pending clinical course   Consultants:  ID- eval pending  Procedures:  none  Antibiotics:  Vanc and cefepime started on 12/9  Flagyl started 12/10  HPI/Subjective: Sleepy but easily aroused. States he had some left arm pain overnight but no further today. Denies CP and no SOB. Diarrhea x3 overnight  Objective: Filed Vitals:   05/21/13 0915  BP: 143/121  Pulse: 108  Temp: 97.8 F (36.6 C)  Resp:      Intake/Output Summary (Last 24 hours) at 05/21/13 1400 Last data filed at 05/21/13 0900  Gross per 24 hour  Intake    240 ml  Output    725 ml  Net   -485 ml   Filed Weights   05/20/13 0926  Weight: 88.1 kg (194 lb 3.6 oz)    Exam:  General: alert & answers appropriately In NAD Cardiovascular: RRR, nl S1 s2 Respiratory: decreased BS at base, few rhonchi Abdomen: soft +BS NT/ND, no masses palpable Extremities: No cyanosis and no edema    Data Reviewed: Basic Metabolic Panel:  Recent Labs Lab 05/15/13 0420 05/16/13 0527 05/17/13 0530 05/20/13 0619 05/21/13 0448  NA 141 141 138 140 135  K 3.6 3.4* 3.2* 4.0 3.5  CL 106 104 99 98 98  CO2 25 27 29   --  26  GLUCOSE 159* 142* 169* 264* 247*  BUN 17 15 22  35* 39*  CREATININE 1.25 1.17 1.46* 1.60* 1.40*  CALCIUM 8.4 8.2* 8.6  --  8.7  MG  --   --  2.3  --   --    Liver Function Tests:  Recent Labs Lab 05/17/13 0530  AST 22  ALT 17  ALKPHOS 47  BILITOT 0.9  PROT 7.4  ALBUMIN 3.3*   No results found for this basename: LIPASE, AMYLASE,  in the last 168 hours No results found for this basename: AMMONIA,  in the last 168 hours CBC:  Recent Labs Lab 05/15/13 0420 05/16/13 0527 05/17/13 0530 05/20/13 0606 05/20/13 0619 05/21/13 0448  WBC 7.8 5.9 6.2  20.3*  --  15.7*  NEUTROABS  --   --  3.1 18.2*  --   --   HGB 10.9* 11.2* 11.1* 12.7* 13.6 11.3*  HCT 32.7* 34.0* 33.0* 37.3* 40.0 33.4*  MCV 98.5 98.8 97.3 97.9  --  97.9  PLT 75* 100* 103* 159  --  139*   Cardiac Enzymes:  Recent Labs Lab 05/16/13 0527 05/16/13 1350 05/20/13 0606  TROPONINI 0.90* 0.45* <0.30   BNP (last 3 results)  Recent Labs  05/14/13 0930 05/16/13 1403 05/20/13 0606  PROBNP 3540.0* 2658.0* 1575.0*   CBG:  Recent Labs Lab 05/20/13 1251 05/20/13 1650 05/20/13 2153 05/21/13 0727 05/21/13 1134  GLUCAP 282* 211* 239* 211* 372*    Recent Results (from the past 240 hour(s))  CULTURE, BLOOD (ROUTINE X 2)      Status: None   Collection Time    05/13/13 12:55 AM      Result Value Range Status   Specimen Description BLOOD RIGHT HAND   Final   Special Requests BOTTLES DRAWN AEROBIC AND ANAEROBIC    Final   Culture  Setup Time     Final   Value: 05/13/2013 04:24     Performed at [facility]   Culture     Final   Value: NO GROWTH 5 DAYS     Performed at [facility]   Report Status 05/19/2013 FINAL   Final  CULTURE, BLOOD (ROUTINE X 2)     Status: None   Collection Time    05/13/13  1:15 AM      Result Value Range Status   Specimen Description BLOOD LEFT HAND   Final   Special Requests BOTTLES DRAWN AEROBIC AND ANAEROBIC    Final   Culture  Setup Time     Final   Value: 05/13/2013 04:24     Performed at [facility]   Culture     Final   Value: NO GROWTH 5 DAYS     Performed at [facility]   Report Status 05/19/2013 FINAL   Final  MRSA PCR SCREENING     Status: Abnormal   Collection Time    05/13/13 12:54 PM      Result Value Range Status   MRSA by PCR POSITIVE (*) NEGATIVE Final   Comment:            The GeneXpert MRSA Assay (FDA     approved for NASAL specimens     only), is one component of a     comprehensive MRSA colonization     surveillance program. It is not     intended to diagnose MRSA     infection nor to guide or     monitor treatment for     MRSA infections.     RESULT CALLED TO, READ BACK BY AND VERIFIED WITH:     Chancy Milroy RN 1610 05/13/13 [facility]  CULTURE, BLOOD (ROUTINE X 2)     Status: None   Collection Time    05/20/13  7:12 AM      Result Value Range Status   Specimen Description BLOOD LEFT ANTECUBITAL   Final   Special Requests BOTTLES DRAWN AEROBIC AND ANAEROBIC   Final   Culture  Setup Time     Final   Value: 05/20/2013 11:42     Performed at [facility]   Culture     Final   Value:  BLOOD CULTURE RECEIVED NO GROWTH TO DATE CULTURE WILL BE HELD FOR 5 DAYS BEFORE ISSUING A FINAL NEGATIVE  REPORT     Performed at [facility]   Report Status PENDING   Incomplete  CULTURE, BLOOD (ROUTINE X 2)     Status: None   Collection Time    05/20/13  7:12 AM      Result Value Range Status   Specimen Description BLOOD RIGHT HAND   Final   Special Requests BOTTLES DRAWN AEROBIC AND ANAEROBIC   Final   Culture  Setup Time     Final   Value: 05/20/2013 11:42     Performed at [facility]   Culture     Final   Value:        BLOOD CULTURE RECEIVED NO GROWTH TO DATE CULTURE WILL BE HELD FOR 5 DAYS BEFORE ISSUING A FINAL NEGATIVE REPORT     Performed at [facility]   Report Status PENDING   Incomplete  CLOSTRIDIUM DIFFICILE BY PCR     Status: Abnormal   Collection Time    05/21/13 12:31 AM      Result Value Range Status   C difficile by pcr POSITIVE (*) NEGATIVE Final   Comment: CRITICAL RESULT CALLED TO, READ BACK BY AND VERIFIED WITH:     REYNOLDS RN 9:25 05/21/13 ([email or web address])     Performed at [facility]     Studies: Dg Chest 2 View  05/20/2013   CLINICAL DATA:  Shortness of breath.  EXAM: CHEST  2 VIEW  COMPARISON:  Chest radiograph May 13, 2013  FINDINGS: The cardiac silhouette appears mildly enlarged, improved from prior examination. Status post median sternotomy for apparent Coronary artery bypass grafting. Mild central pulmonary vasculature congestion, decreased. Bandlike densities in left lung base. No pleural effusions. No pneumothorax.  Multiple EKG lines overlie the patient and may obscure subtle underlying pathology. Soft tissue planes and included osseous structures are nonsuspicious.  IMPRESSION: Improved appearance of chest: Decreased cardiomegaly, with mild central pulmonary vasculature congestion, left lung base atelectasis.   Electronically Signed   By: Awilda Metro   On: 05/20/2013 06:13    Scheduled Meds: . ceFEPime (MAXIPIME) IV  1 g Intravenous Q24H  . Chlorhexidine Gluconate Cloth  6 each Topical Q0600  . gemfibrozil  600  mg Oral BID AC  . insulin aspart  0-9 Units Subcutaneous TID WC  . levothyroxine  50 mcg Oral QAC breakfast  . metroNIDAZOLE  500 mg Oral Q8H  . mirtazapine  15 mg Oral QHS  . mupirocin ointment  1 application Nasal BID  . saccharomyces boulardii  250 mg Oral BID  . vancomycin  1,000 mg Intravenous Q24H   Continuous Infusions:   Principal Problem:   HCAP (healthcare-associated pneumonia) Active Problems:   CAD (coronary artery disease)   Paroxysmal a-fib   DM (diabetes mellitus), type 2, uncontrolled with complications   Hypertension   S/P CABG (coronary artery bypass graft)   Hyperlipidemia   Hypothyroidism   SIRS (systemic inflammatory response syndrome)   Acute on chronic combined systolic and diastolic heart failure    Time spent: 35    Mayan Dolney C  [facility] Pager [phone]. If 7PM-7AM, please contact night-coverage at [email or web address], password [facility] 05/21/2013, 2:00 PM  LOS: 1 day

## 2013-05-21 NOTE — Progress Notes (Signed)
Pharmacist Heart Failure Core Measure Documentation  Assessment: David Davidson has an EF documented as 35-40% on 05/13/2013 by ECHO.  Rationale: Heart failure patients with left ventricular systolic dysfunction (LVSD) and an EF < 40% should be prescribed an angiotensin converting enzyme inhibitor (ACEI) or angiotensin receptor blocker (ARB) at discharge unless a contraindication is documented in the medical record.  This patient is not currently on an ACEI or ARB for HF.  This note is being placed in the record in order to provide documentation that a contraindication to the use of these agents is present for this encounter.  ACE Inhibitor or Angiotensin Receptor Blocker is contraindicated (specify all that apply)  []   ACEI allergy AND ARB allergy []   Angioedema []   Moderate or severe aortic stenosis []   Hyperkalemia []   Hypotension []   Renal artery stenosis [x]   Worsening renal function, preexisting renal disease or dysfunction  MD, please f/u Scr and re-assess if pt is a candidate for ACE-I or ARB once renal function normalizes.    Geoffry Paradise, PharmD, BCPS Pager: [phone] 1:51 PM Pharmacy #: [id number]

## 2013-05-21 NOTE — Consult Note (Signed)
[facility]    Date of Admission:  05/20/2013   Total days of antibiotics 10        Day 2 IV vancomycin        Day 2 cefepime        Day 1 metronidazole       Reason for Consult: Recurrent C. difficile colitis    Referring Physician: Dr. Gaynelle Adu Primary Care Physician: Dr. [name]  Principal Problem:   Recurrent colitis due to Clostridium difficile Active Problems:   CAD (coronary artery disease)   Paroxysmal a-fib   DM (diabetes mellitus), type 2, uncontrolled with complications   Hypertension   S/P CABG (coronary artery bypass graft)   Hyperlipidemia   Hypothyroidism   Anemia   SIRS (systemic inflammatory response syndrome)   Acute on chronic combined systolic and diastolic heart failure   HCAP (healthcare-associated pneumonia)   . ceFEPime (MAXIPIME) IV  1 g Intravenous Q24H  . Chlorhexidine Gluconate Cloth  6 each Topical Q0600  . gemfibrozil  600 mg Oral BID AC  . insulin aspart  0-9 Units Subcutaneous TID WC  . levothyroxine  50 mcg Oral QAC breakfast  . metroNIDAZOLE  500 mg Oral Q8H  . mirtazapine  15 mg Oral QHS  . mupirocin ointment  1 application Nasal BID  . saccharomyces boulardii  250 mg Oral BID  . vancomycin  1,000 mg Intravenous Q24H    Recommendations: 1. Change oral metronidazole to oral vancomycin 500 mg 4 times a day 2. Continue probiotic 3. Discontinue IV vancomycin and cefepime   Assessment: He has developed another recurrence of his C. difficile colitis, most likely triggered by recent antibiotic therapy for right leg cellulitis. I do not see any evidence of pneumonia or other systemic infections at this time and would favor stopping IV vancomycin and cefepime so they do not add further pressure on his normal flora. Given his age, mild renal insufficiency, and history of recurrences I would favor treating him with high-dose oral vancomycin for now.   HPI: David Davidson is a 79 y.o.  male history of recurrent C. difficile colitis. 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 However he has had several recent hospitalizations for atrial fibrillation and heart failure. He was readmitted 10 days ago with shortness of breath and was noted to have acute, nonpainful erythema of his right lower leg and was treated for possible cellulitis. The cellulitis improved and his fevers resolved. He was discharged home 4 days ago on oral levofloxacin. Yesterday he was increasingly lethargic and was readmitted with low-grade fever, leukocytosis and new onset of diarrhea. There was some concern that he might have pneumonia but he does not describe any new cough or shortness of breath. His chest x-ray does not show any acute abnormalities. His stool C. difficile PCR is positive.  Time line of hospitalizations and Clostridium difficile recurrences:  Initial infection:  12/31-1/9 vanco 125mg  PO QID x 14d  Recurrences:  Jan 13- 18 hospitalized, discharged on vanco 125mg  PO QID x 14d  Feb 2013- treated as o/p by PCP with metronidazole 500 TID x 14 d  Mar 6-11, hospitalized, discharged on vancomycin taper: 125 QID x 7 addn day, 125 BID x 7d, 125 daily x 7d, 125 QOD x 7d  Mar 29-April 4th, hospitalized, discharged on vancomycin 125 mg PO QID x 14 ,  and rifaximin taper  May 2013- prolonged vancomycin taper(as detailed above), florastor  June 2013- prolonged vancomycin taper (as detailed above), florastor August 2013- 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 C. difficile PCR is positive  Review of Systems: Constitutional: positive for anorexia, fevers and malaise,  negative for chills, sweats and weight loss Eyes: negative Ears, nose, mouth, throat, and face: negative Respiratory: negative Cardiovascular: negative Gastrointestinal: positive for diarrhea, negative for abdominal pain, constipation, nausea and vomiting Genitourinary:negative  Past Medical History  Diagnosis Date  . Myocardial infarct   . Diabetes mellitus   . Hypertension   . CHF (congestive heart failure)   . Coronary artery disease   . Clostridium difficile diarrhea     recurrent   . Hyperlipidemia   . Pneumonia 04/2011  . Shortness of breath     only with my heart failure "  . Chronic kidney disease   . Dysrhythmia   . Paroxysmal atrial fibrillation     History  Substance Use Topics  . Smoking status: Former Smoker -- 1.00 packs/day    Types: Cigarettes    Quit date: 05/08/1974  . Smokeless tobacco: Never Used  . Alcohol Use: No     Comment: occasionally    Family History  Problem Relation Age of Onset  . Stomach cancer Mother   . Coronary artery disease Father   . Diabetes Son   . Colon cancer Neg Hx    Allergies  Allergen Reactions  . Morphine And Related Other (See Comments)    Reaction unknown    OBJECTIVE: Blood pressure 103/64, pulse 107, temperature 97.8 F (36.6 C), temperature source Oral, resp. rate 18, height 6\' 1"  (1.854 m), weight 88.1 kg (194 lb 3.6 oz), SpO2 97.00%. General: He appears slightly worn out but is in no distress. He is alert and conversant Skin: No acute rash Lungs: Clear Cor: Regular S1 and S2 with a 1/6 systolic murmur Abdomen: Soft and nontender with quiet bowel sounds Joints and extremities: Venous stasis dermatitis of his lower extremities without evidence of acute cellulitis  Lab Results Lab Results  Component Value Date   WBC 15.7* 05/21/2013   HGB 11.3* 05/21/2013   HCT 33.4* 05/21/2013   MCV 97.9 05/21/2013   PLT 139* 05/21/2013    Lab Results  Component Value Date   CREATININE 1.40* 05/21/2013   BUN 39*  05/21/2013   NA 135 05/21/2013   K 3.5 05/21/2013   CL 98 05/21/2013   CO2 26 05/21/2013    Lab Results  Component Value Date   ALT 17 05/17/2013   AST 22 05/17/2013   ALKPHOS 47 05/17/2013   BILITOT 0.9 05/17/2013     Microbiology: Recent Results (from the past 240 hour(s))  CULTURE, BLOOD (ROUTINE X 2)     Status: None   Collection Time    05/13/13 12:55 AM      Result Value Range Status   Specimen Description BLOOD RIGHT HAND   Final   Special Requests BOTTLES DRAWN AEROBIC AND ANAEROBIC    Final   Culture  Setup Time     Final   Value: 05/13/2013 04:24     Performed at [facility]   Culture     Final   Value: NO GROWTH 5 DAYS  Performed at [facility]   Report Status 05/19/2013 FINAL   Final  CULTURE, BLOOD (ROUTINE X 2)     Status: None   Collection Time    05/13/13  1:15 AM      Result Value Range Status   Specimen Description BLOOD LEFT HAND   Final   Special Requests BOTTLES DRAWN AEROBIC AND ANAEROBIC    Final   Culture  Setup Time     Final   Value: 05/13/2013 04:24     Performed at [facility]   Culture     Final   Value: NO GROWTH 5 DAYS     Performed at [facility]   Report Status 05/19/2013 FINAL   Final  MRSA PCR SCREENING     Status: Abnormal   Collection Time    05/13/13 12:54 PM      Result Value Range Status   MRSA by PCR POSITIVE (*) NEGATIVE Final   Comment:            The GeneXpert MRSA Assay (FDA     approved for NASAL specimens     only), is one component of a     comprehensive MRSA colonization     surveillance program. It is not     intended to diagnose MRSA     infection nor to guide or     monitor treatment for     MRSA infections.     RESULT CALLED TO, READ BACK BY AND VERIFIED WITH:     Chancy Milroy RN 1610 05/13/13 A [facility]  CULTURE, BLOOD (ROUTINE X 2)     Status: None   Collection Time    05/20/13  7:12 AM      Result Value Range Status   Specimen Description BLOOD LEFT ANTECUBITAL    Final   Special Requests BOTTLES DRAWN AEROBIC AND ANAEROBIC   Final   Culture  Setup Time     Final   Value: 05/20/2013 11:42     Performed at [facility]   Culture     Final   Value:        BLOOD CULTURE RECEIVED NO GROWTH TO DATE CULTURE WILL BE HELD FOR 5 DAYS BEFORE ISSUING A FINAL NEGATIVE REPORT     Performed at [facility]   Report Status PENDING   Incomplete  CULTURE, BLOOD (ROUTINE X 2)     Status: None   Collection Time    05/20/13  7:12 AM      Result Value Range Status   Specimen Description BLOOD RIGHT HAND   Final   Special Requests BOTTLES DRAWN AEROBIC AND ANAEROBIC   Final   Culture  Setup Time     Final   Value: 05/20/2013 11:42     Performed at [facility]   Culture     Final   Value:        BLOOD CULTURE RECEIVED NO GROWTH TO DATE CULTURE WILL BE HELD FOR 5 DAYS BEFORE ISSUING A FINAL NEGATIVE REPORT     Performed at [facility]   Report Status PENDING   Incomplete  CLOSTRIDIUM DIFFICILE BY PCR     Status: Abnormal   Collection Time    05/21/13 12:31 AM      Result Value Range Status   C difficile by pcr POSITIVE (*) NEGATIVE Final   Comment: CRITICAL RESULT CALLED TO, READ BACK BY AND VERIFIED WITH:  REYNOLDS RN 9:25 05/21/13 ([email or web address])     Performed at [facility] [place]    Cliffton Asters, MD [facility] for Infectious Disease [facility] [phone] pager   [phone] cell 05/21/2013, 2:52 PM

## 2013-05-22 LAB — CBC
MCH: 32.8 pg (ref 26.0–34.0)
MCHC: 34 g/dL (ref 30.0–36.0)
Platelets: 130 10*3/uL — ABNORMAL LOW (ref 150–400)
RDW: 14.1 % (ref 11.5–15.5)

## 2013-05-22 LAB — BASIC METABOLIC PANEL
BUN: 38 mg/dL — ABNORMAL HIGH (ref 6–23)
Calcium: 8.7 mg/dL (ref 8.4–10.5)
Creatinine, Ser: 1.17 mg/dL (ref 0.50–1.35)
GFR calc Af Amer: 57 mL/min — ABNORMAL LOW (ref >90)
GFR calc non Af Amer: 49 mL/min — ABNORMAL LOW (ref >90)
Glucose, Bld: 159 mg/dL — ABNORMAL HIGH (ref 70–99)
Potassium: 3.5 mEq/L (ref 3.5–5.1)
Sodium: 137 mEq/L (ref 135–145)

## 2013-05-22 LAB — GLUCOSE, CAPILLARY
Glucose-Capillary: 143 mg/dL — ABNORMAL HIGH (ref 70–99)
Glucose-Capillary: 289 mg/dL — ABNORMAL HIGH (ref 70–99)

## 2013-05-22 NOTE — Progress Notes (Addendum)
[facility] PROGRESS NOTE  David Davidson WUJ:[id number] DOB: 1911-05-21 DOA: 05/20/2013 PCP: [name], MD  Assessment/Plan: C. 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  CAD - s/p CABG, NSTEMI last hospitalization, repeat EKG overnight with no acute changes and pt CP free today.  CHF - continue holding Lasix for now, as clinically dehydrated, monitor volume status closely and follow resume lasix in am if no further diarrhea and po intake better DM - SSI  Hypothyroidism - continue Synthroid  HTN - hold medications  Goals of care - his case was discussed in detail with his son and his daughterper Dr Lafe Garin on admission. Family agreed to IV antibiotics, IV fluids to augment blood pressure, however family is very understandable that he is critically ill at this point. If he is to decompensate, his son [name] [phone], [phone]) would like to be notified. Based on Dr Charlean Sanfilippo discussions with him, would avoid invasive or escalating aggressive measures, and focus on comfort if pt was to decline.     Code Status: DNR Family Communication: Daughter at bedside  Disposition Plan: pending clinical course   Consultants:  ID- eval pending  Procedures:  none  Antibiotics:  Vanc and cefepime started on 12/9  Flagyl started 12/10  HPI/Subjective: Feels better today, though still with poor appetite. Diarrhea last pm, but none so far today, no n/v.  Objective: Filed Vitals:   05/22/13 1433  BP: 106/60  Pulse:  80  Temp: 98.2 F (36.8 C)  Resp: 18    Intake/Output Summary (Last 24 hours) at 05/22/13 2039 Last data filed at 05/22/13 1700  Gross per 24 hour  Intake    830 ml  Output   1151 ml  Net   -321 ml   Filed Weights   05/20/13 0926  Weight: 88.1 kg (194 lb 3.6 oz)    Exam:  General: alert & answers appropriately In NAD Cardiovascular: RRR, nl S1 s2 Respiratory: decreased BS at base, no wheezes abd no crackles Abdomen: soft +BS NT/ND, no masses palpable Extremities: No cyanosis and no edema    Data Reviewed: Basic Metabolic Panel:  Recent Labs Lab 05/16/13 0527 05/17/13 0530 05/20/13 0619 05/21/13 0448 05/22/13 0456  NA 141 138 140 135 137  K 3.4* 3.2* 4.0 3.5 3.5  CL 104 99 98 98 99  CO2 27 29  --  26 27  GLUCOSE 142* 169* 264* 247* 159*  BUN 15 22 35* 39* 38*  CREATININE 1.17 1.46* 1.60* 1.40* 1.17  CALCIUM 8.2* 8.6  --  8.7 8.7  MG  --  2.3  --   --   --    Liver Function Tests:  Recent Labs Lab 05/17/13 0530  AST 22  ALT 17  ALKPHOS 47  BILITOT 0.9  PROT 7.4  ALBUMIN 3.3*   No results found for this basename: LIPASE, AMYLASE,  in the last 168 hours No results found for this basename: AMMONIA,  in the last 168 hours CBC:  Recent Labs Lab 05/16/13 0527 05/17/13 0530  05/20/13 0606 05/20/13 0619 05/21/13 0448 05/22/13 0456  WBC 5.9 6.2 20.3*  --  15.7* 13.7*  NEUTROABS  --  3.1 18.2*  --   --   --   HGB 11.2* 11.1* 12.7* 13.6 11.3* 10.9*  HCT 34.0* 33.0* 37.3* 40.0 33.4* 32.1*  MCV 98.8 97.3 97.9  --  97.9 96.7  PLT 100* 103* 159  --  139* 130*   Cardiac Enzymes:  Recent Labs Lab 05/16/13 0527 05/16/13 1350 05/20/13 0606  TROPONINI 0.90* 0.45* <0.30   BNP (last 3 results)  Recent Labs  05/14/13 0930 05/16/13 1403 05/20/13 0606  PROBNP 3540.0* 2658.0* 1575.0*   CBG:  Recent Labs Lab 05/21/13 1649 05/21/13 2332 05/22/13 0736 05/22/13 1159 05/22/13 1727  GLUCAP 260* 167* 143* 289* 182*    Recent Results (from the  past 240 hour(s))  CULTURE, BLOOD (ROUTINE X 2)     Status: None   Collection Time    05/13/13 12:55 AM      Result Value Range Status   Specimen Description BLOOD RIGHT HAND   Final   Special Requests BOTTLES DRAWN AEROBIC AND ANAEROBIC    Final   Culture  Setup Time     Final   Value: 05/13/2013 04:24     Performed at [facility]   Culture     Final   Value: NO GROWTH 5 DAYS     Performed at [facility]   Report Status 05/19/2013 FINAL   Final  CULTURE, BLOOD (ROUTINE X 2)     Status: None   Collection Time    05/13/13  1:15 AM      Result Value Range Status   Specimen Description BLOOD LEFT HAND   Final   Special Requests BOTTLES DRAWN AEROBIC AND ANAEROBIC    Final   Culture  Setup Time     Final   Value: 05/13/2013 04:24     Performed at [facility]   Culture     Final   Value: NO GROWTH 5 DAYS     Performed at [facility]   Report Status 05/19/2013 FINAL   Final  MRSA PCR SCREENING     Status: Abnormal   Collection Time    05/13/13 12:54 PM      Result Value Range Status   MRSA by PCR POSITIVE (*) NEGATIVE Final   Comment:            The GeneXpert MRSA Assay (FDA     approved for NASAL specimens     only), is one component of a     comprehensive MRSA colonization     surveillance program. It is not     intended to diagnose MRSA     infection nor to guide or     monitor treatment for     MRSA infections.     RESULT CALLED TO, READ BACK BY AND VERIFIED WITH:     Chancy Milroy RN [id number] 05/13/13 A [facility]  CULTURE, BLOOD (ROUTINE X 2)     Status: None   Collection Time    05/20/13  7:12 AM      Result Value Range Status   Specimen Description BLOOD LEFT ANTECUBITAL   Final   Special Requests BOTTLES DRAWN AEROBIC AND ANAEROBIC   Final   Culture  Setup Time     Final   Value: 05/20/2013 11:42     Performed at [facility]  Final   Value:        BLOOD CULTURE RECEIVED NO GROWTH TO DATE CULTURE WILL BE  HELD FOR 5 DAYS BEFORE ISSUING A FINAL NEGATIVE REPORT     Performed at [facility]   Report Status PENDING   Incomplete  CULTURE, BLOOD (ROUTINE X 2)     Status: None   Collection Time    05/20/13  7:12 AM      Result Value Range Status   Specimen Description BLOOD RIGHT HAND   Final   Special Requests BOTTLES DRAWN AEROBIC AND ANAEROBIC   Final   Culture  Setup Time     Final   Value: 05/20/2013 11:42     Performed at [facility]   Culture     Final   Value:        BLOOD CULTURE RECEIVED NO GROWTH TO DATE CULTURE WILL BE HELD FOR 5 DAYS BEFORE ISSUING A FINAL NEGATIVE REPORT     Performed at [facility]   Report Status PENDING   Incomplete  CLOSTRIDIUM DIFFICILE BY PCR     Status: Abnormal   Collection Time    05/21/13 12:31 AM      Result Value Range Status   C difficile by pcr POSITIVE (*) NEGATIVE Final   Comment: CRITICAL RESULT CALLED TO, READ BACK BY AND VERIFIED WITH:     REYNOLDS RN 9:25 05/21/13 ([email or web address])     Performed at [facility]     Studies: No results found.  Scheduled Meds: . Chlorhexidine Gluconate Cloth  6 each Topical Q0600  . gemfibrozil  600 mg Oral BID AC  . insulin aspart  0-9 Units Subcutaneous TID WC  . levothyroxine  50 mcg Oral QAC breakfast  . mirtazapine  15 mg Oral QHS  . mupirocin ointment  1 application Nasal BID  . saccharomyces boulardii  250 mg Oral BID  . vancomycin  500 mg Oral Q6H   Continuous Infusions:   Principal Problem:   Recurrent colitis due to Clostridium difficile Active Problems:   CAD (coronary artery disease)   Paroxysmal a-fib   DM (diabetes mellitus), type 2, uncontrolled with complications   Hypertension   S/P CABG (coronary artery bypass graft)   Hyperlipidemia   Hypothyroidism   Anemia   SIRS (systemic inflammatory response syndrome)   Acute on chronic combined systolic and diastolic heart failure   HCAP (healthcare-associated pneumonia)    Time spent:  35    Eliora Nienhuis C  [facility] Pager [phone]. If 7PM-7AM, please contact night-coverage at [email or web address], password [facility] 05/22/2013, 8:39 PM  LOS: 2 days

## 2013-05-22 NOTE — Progress Notes (Signed)
Patient ID: David Davidson, male   DOB: 1910/09/08, 79 y.o.   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 Chlorhexidine Gluconate Cloth  6 each Topical Q0600  . gemfibrozil  600 mg Oral BID AC  . insulin aspart  0-9 Units Subcutaneous TID WC  . levothyroxine  50 mcg Oral QAC breakfast  . mirtazapine  15 mg Oral QHS  . mupirocin ointment  1 application Nasal BID  . saccharomyces boulardii  250 mg Oral BID  . vancomycin  500 mg Oral Q6H    Subjective: He still has no energy or appetite to speak of but has not had any further diarrhea in the past 24 hours. He does not have any abdominal pain, nausea or vomiting.  Review of Systems: Pertinent items are noted in HPI.  Past Medical History  Diagnosis Date  . Myocardial infarct   . Diabetes mellitus   . Hypertension   . CHF (congestive heart failure)   . Coronary artery disease   . Clostridium difficile diarrhea     recurrent   . Hyperlipidemia   . Pneumonia 04/2011  . Shortness of breath     only with my heart failure "  . Chronic kidney disease   . Dysrhythmia   . Paroxysmal atrial fibrillation     History  Substance Use Topics  . Smoking status: Former Smoker -- 1.00 packs/day    Types: Cigarettes    Quit date: 05/08/1974  . Smokeless tobacco: Never Used  . Alcohol Use: No     Comment: occasionally    Family History  Problem Relation Age of Onset  . Stomach  cancer Mother   . Coronary artery disease Father   . Diabetes Son   . Colon cancer Neg Hx     Allergies  Allergen Reactions  . Morphine And Related Other (See Comments)    Reaction unknown    Objective: Temp:  [98 F (36.7 C)-98.8 F (37.1 C)] 98.8 F (37.1 C) (12/11 0500) Pulse Rate:  [99-107] 101 (12/11 0500) Resp:  [18-24] 18 (12/11 0500) BP: (103-110)/(62-77) 109/62 mmHg (12/11 0500) SpO2:  [97 %-99 %] 99 % (12/11 0500)  General: He is in no distress watching TV Skin: No rash Lungs: Clear Cor: Regular S1-S2 with a 1/6 systolic murmur Abdomen: Soft and nontender  Lab Results Lab Results  Component Value Date   WBC 13.7* 05/22/2013   HGB 10.9* 05/22/2013   HCT 32.1* 05/22/2013   MCV 96.7 05/22/2013   PLT 130* 05/22/2013    Lab Results  Component Value Date   CREATININE 1.17 05/22/2013   BUN 38* 05/22/2013   NA 137 05/22/2013   K 3.5 05/22/2013   CL 99 05/22/2013   CO2 27 05/22/2013    Lab Results  Component Value Date   ALT 17 05/17/2013   AST 22 05/17/2013   ALKPHOS  47 05/17/2013   BILITOT 0.9 05/17/2013      Microbiology: Recent Results (from the past 240 hour(s))  CULTURE, BLOOD (ROUTINE X 2)     Status: None   Collection Time    05/13/13 12:55 AM      Result Value Range Status   Specimen Description BLOOD RIGHT HAND   Final   Special Requests BOTTLES DRAWN AEROBIC AND ANAEROBIC    Final   Culture  Setup Time     Final   Value: 05/13/2013 04:24     Performed at [facility]   Culture     Final   Value: NO GROWTH 5 DAYS     Performed at [facility]   Report Status 05/19/2013 FINAL   Final  CULTURE, BLOOD (ROUTINE X 2)     Status: None   Collection Time    05/13/13  1:15 AM      Result Value Range Status   Specimen Description BLOOD LEFT HAND   Final   Special Requests BOTTLES DRAWN AEROBIC AND ANAEROBIC    Final   Culture  Setup Time     Final   Value: 05/13/2013 04:24     Performed at [facility]    Culture     Final   Value: NO GROWTH 5 DAYS     Performed at [facility]   Report Status 05/19/2013 FINAL   Final  MRSA PCR SCREENING     Status: Abnormal   Collection Time    05/13/13 12:54 PM      Result Value Range Status   MRSA by PCR POSITIVE (*) NEGATIVE Final   Comment:            The GeneXpert MRSA Assay (FDA     approved for NASAL specimens     only), is one component of a     comprehensive MRSA colonization     surveillance program. It is not     intended to diagnose MRSA     infection nor to guide or     monitor treatment for     MRSA infections.     RESULT CALLED TO, READ BACK BY AND VERIFIED WITH:     Chancy Milroy RN [id number] 05/13/13 [facility]  CULTURE, BLOOD (ROUTINE X 2)     Status: None   Collection Time    05/20/13  7:12 AM      Result Value Range Status   Specimen Description BLOOD LEFT ANTECUBITAL   Final   Special Requests BOTTLES DRAWN AEROBIC AND ANAEROBIC   Final   Culture  Setup Time     Final   Value: 05/20/2013 11:42     Performed at [facility]   Culture     Final   Value:        BLOOD CULTURE RECEIVED NO GROWTH TO DATE CULTURE WILL BE HELD FOR 5 DAYS BEFORE ISSUING A FINAL NEGATIVE REPORT     Performed at [facility]   Report Status PENDING   Incomplete  CULTURE, BLOOD (ROUTINE X 2)     Status: None   Collection Time    05/20/13  7:12 AM      Result Value Range Status   Specimen Description BLOOD RIGHT HAND   Final   Special Requests BOTTLES DRAWN AEROBIC AND ANAEROBIC   Final   Culture  Setup Time     Final   Value: 05/20/2013 11:42  Performed at [facility]     Final   Value:        BLOOD CULTURE RECEIVED NO GROWTH TO DATE CULTURE WILL BE HELD FOR 5 DAYS BEFORE ISSUING A FINAL NEGATIVE REPORT     Performed at [facility]   Report Status PENDING   Incomplete  CLOSTRIDIUM DIFFICILE BY PCR     Status: Abnormal   Collection Time    05/21/13 12:31 AM      Result Value Range Status   C  difficile by pcr POSITIVE (*) NEGATIVE Final   Comment: CRITICAL RESULT CALLED TO, READ BACK BY AND VERIFIED WITH:     REYNOLDS RN 9:25 05/21/13 ([email or web address])     Performed at [facility]   Assessment: He has recurrent but mild C. difficile colitis that seems to be improving.  Plan: 1. Continue vancomycin 4 times daily for 2 weeks  Cliffton Asters, MD [facility] [phone] pager   [phone] cell 05/22/2013, 2:10 PM

## 2013-05-23 DIAGNOSIS — I4891 Unspecified atrial fibrillation: Secondary | ICD-10-CM

## 2013-05-23 LAB — CBC
HCT: 31.9 % — ABNORMAL LOW (ref 39.0–52.0)
Hemoglobin: 11.1 g/dL — ABNORMAL LOW (ref 13.0–17.0)
MCH: 33.5 pg (ref 26.0–34.0)
MCHC: 34.8 g/dL (ref 30.0–36.0)
MCV: 96.4 fL (ref 78.0–100.0)
Platelets: 132 10*3/uL — ABNORMAL LOW (ref 150–400)
RBC: 3.31 MIL/uL — ABNORMAL LOW (ref 4.22–5.81)

## 2013-05-23 LAB — BASIC METABOLIC PANEL
BUN: 32 mg/dL — ABNORMAL HIGH (ref 6–23)
CO2: 28 mEq/L (ref 19–32)
Calcium: 8.8 mg/dL (ref 8.4–10.5)
Chloride: 98 mEq/L (ref 96–112)
Creatinine, Ser: 1 mg/dL (ref 0.50–1.35)
GFR calc non Af Amer: 59 mL/min — ABNORMAL LOW (ref >90)
Glucose, Bld: 133 mg/dL — ABNORMAL HIGH (ref 70–99)
Sodium: 136 mEq/L (ref 135–145)

## 2013-05-23 LAB — GLUCOSE, CAPILLARY
Glucose-Capillary: 193 mg/dL — ABNORMAL HIGH (ref 70–99)
Glucose-Capillary: 140 mg/dL — ABNORMAL HIGH (ref 70–99)

## 2013-05-23 MED ORDER — VANCOMYCIN 50 MG/ML ORAL SOLUTION: 500.0000 mg | Freq: Four times a day (QID) | ORAL | Status: DC

## 2013-05-23 NOTE — Progress Notes (Signed)
Ambulated pt in hall with walker.  No shortness of breath and tolerated well.  Pt then taken to the bathroom and pt voided.

## 2013-05-23 NOTE — Discharge Summary (Signed)
Physician Discharge Summary  David Davidson KGM:[id number] DOB: [birth year] DOA: 05/20/2013  PCP: David Headings, MD  Admit date: 05/20/2013 Discharge date: 05/23/2013  Time spent: >30 minutes  Recommendations for Outpatient Follow-up:  Follow-up Information   Follow up with David Headings, MD. (in 1-2weeks,call for appt upon discharge)    Specialty:  Internal Medicine   Contact information:   [address] [place] [address] [phone]       Follow up with David Asters, MD. (call upon discharge for appt)    Specialty:  Infectious Diseases   Contact information:   [address] [place] [address] [phone]        Discharge Diagnoses:  Principal Problem:   Recurrent colitis due to Clostridium difficile Active Problems:   CAD (coronary artery disease)   Paroxysmal a-fib   DM (diabetes mellitus), type 2, uncontrolled with complications   Hypertension   S/P CABG (coronary artery bypass graft)   Hyperlipidemia   Hypothyroidism   Anemia   SIRS (systemic inflammatory response syndrome)   Acute on chronic combined systolic and diastolic heart failure   HCAP (healthcare-associated pneumonia)   Discharge Condition: improved/stable  Diet recommendation: heart healthy  Filed Weights   05/20/13 0926 05/23/13 0645  Weight: 88.1 kg (194 lb 3.6 oz) 87.8 kg (193 lb 9 oz)    History of present illness:  Pt is a 79 y.o. male has a past medical history significant for CHF (last 2D echo 05/13/2013 with EF 35-40% and restrictive physiology, severe Aortic stenosis), DM, CAD, recent NSTEMI during his prior hospitalization last week, recent cellulitis, paroxysmal A fib previously on Coumadin stopped due to GI Bleed, chronic thrombocytopenia, recently discharge 3 days ago, presents to the ED with a chief complaint of progressive SOB and DOE in the past 2-3 days. He denies fever/chills, denies chest pain, has no cough/sputum production.  Endorses feeling weak and without energy, particularly worse today. Denies dysuria, denies abdominal pain, nausea/vomiting or diarrhea.    Hospital Course:  C. Diff.Diarrhea  -Patient developed diarrhea while in the hospital C. 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 His diarrhea has resolved his appetite is improved is tolerating by mouth well. He'll be discharged on oral vancomycin for 2 weeks and is to follow up with David. Orvan Davidson in 2 weeks.  NO PNA- REVIEW of x rays with no evidence of PNA  -Initially on admission he was empirically treated with broad-spectrum antibiotics for possible HCAP, and in review of his x-rays there was no evidence of a PNA and patient was to have symptoms consistent with pneumonia. -cultures with no growth so far  -ID saw patient of antibiotics for pneumonia we discontinued patient continued to do well. Paroxismal A fib - Coumadin was stopped recently secondary to GI bleed.  CAD - s/p CABG, NSTEMI last hospitalization, repeat EKG overnight with no acute changes and pt CP free today.  CHF - stable. Lasix was held in the hospital at patient was dehydrated, he improved clinically with hydration initially. Is to resume his Lasix up on discharge. DM - SSI  Hypothyroidism - continue Synthroid  HTN - hold medications  Goals of care - Pt was discussed in detail with his son and his daughter per David Davidson on  admission. Family agreed to IV antibiotics, IV fluids to augment blood pressure, however family is very understandable that he is critically ill at this point. If he is to decompensate, his son David [place] [phone]) would like to be notified. Based on David Charlean Sanfilippo discussions with him, would avoid invasive or escalating aggressive measures, and focus  on comfort if pt was to decline.      Procedures:  none  Consultations:  ID  Discharge Exam: Filed Vitals:   05/23/13 0645  BP: 108/68  Pulse: 68  Temp: 98.5 F (36.9 C)  Resp: 17    Exam:  General: alert & answers appropriately In NAD  Cardiovascular: RRR, nl S1 s2  Respiratory: decreased BS at base, no wheezes abd no crackles  Abdomen: soft +BS NT/ND, no masses palpable  Extremities: No cyanosis and no edema    Discharge Instructions  Discharge Orders   Future Orders Complete By Expires   Diet - low sodium heart healthy  As directed    Increase activity slowly  As directed        Medication List    STOP taking these medications       ibuprofen 200 MG tablet  Commonly known as:  ADVIL,MOTRIN      TAKE these medications       diphenhydramine-acetaminophen 25-500 MG Tabs  Commonly known as:  TYLENOL PM  Take 1 tablet by mouth at bedtime as needed (sleep).     ezetimibe 10 MG tablet  Commonly known as:  ZETIA  Take 10 mg by mouth at bedtime.     finasteride 5 MG tablet  Commonly known as:  PROSCAR  Take 5 mg by mouth every morning.     fish oil-omega-3 fatty acids 1000 MG capsule  Take 1 g by mouth every morning.     furosemide 20 MG tablet  Commonly known as:  LASIX  Take 1 tablet (20 mg total) by mouth every evening.     furosemide 40 MG tablet  Commonly known as:  LASIX  Take 1 tablet (40 mg total) by mouth every morning.     gemfibrozil 600 MG tablet  Commonly known as:  LOPID  Take 600 mg by mouth 2 (two) times daily before a meal.     glyBURIDE 5 MG tablet  Commonly known as:  DIABETA  Take 2.5-5 mg by mouth daily as needed (high blood sugar).     ICAPS PO  Take 1 capsule by mouth daily.     levothyroxine 50 MCG tablet  Commonly known as:  SYNTHROID, LEVOTHROID  Take 50 mcg by mouth daily before breakfast.     LORazepam 0.5 MG tablet  Commonly known as:  ATIVAN  Take 0.5 mg by mouth at bedtime as needed. For insomnia.      mirtazapine 15 MG tablet  Commonly known as:  REMERON  Take 15 mg by mouth at bedtime.     multivitamin with minerals Tabs tablet  Take 1 tablet by mouth daily.     potassium chloride 10 MEQ tablet  Commonly known as:  K-DUR,KLOR-CON  Take 10 mEq by mouth 3 (three) times daily.     vancomycin 50 mg/mL oral solution  Commonly known as:  VANCOCIN  Take 10 mLs (500 mg total) by mouth every 6 (six) hours.       Allergies  Allergen Reactions  . Morphine And Related Other (See Comments)    Reaction unknown       Follow-up  Information   Follow up with David Headings, MD. (in 1-2weeks,call for appt upon discharge)    Specialty:  Internal Medicine   Contact information:   [address] [place] [address] [phone]       Follow up with David Asters, MD. (call upon discharge for appt)    Specialty:  Infectious Diseases   Contact information:   [address] [place] [address] [phone]        The results of significant diagnostics from this hospitalization (including imaging, microbiology, ancillary and laboratory) are listed below for reference.    Significant Diagnostic Studies: Dg Chest 2 View  05/20/2013   CLINICAL DATA:  Shortness of breath.  EXAM: CHEST  2 VIEW  COMPARISON:  Chest radiograph May 13, 2013  FINDINGS: The cardiac silhouette appears mildly enlarged, improved from prior examination. Status post median sternotomy for apparent Coronary artery bypass grafting. Mild central pulmonary vasculature congestion, decreased. Bandlike densities in left lung base. No pleural effusions. No pneumothorax.  Multiple EKG lines overlie the patient and may obscure subtle underlying pathology. Soft tissue planes and included osseous structures are nonsuspicious.  IMPRESSION: Improved appearance of chest: Decreased cardiomegaly, with mild central pulmonary vasculature congestion, left lung base atelectasis.   Electronically Signed    By: Awilda Metro   On: 05/20/2013 06:13   Dg Chest Port 1 View  05/13/2013   CLINICAL DATA:  Shortness of breath, fever, history CHF, bladder cancer, hypertension, diabetes, coronary artery disease  EXAM: PORTABLE CHEST - 1 VIEW  COMPARISON:  Portable exam 0826 hr compared to 05/13/2013  FINDINGS: Enlargement of cardiac silhouette post CABG.  Pulmonary vascular congestion.  Scattered interstitial infiltrates question edema/ CHF.  No gross pleural effusion or pneumothorax.  Bones demineralized.  IMPRESSION: Suspect mild CHF.   Electronically Signed   By: Ulyses Southward M.D.   On: 05/13/2013 09:45   Dg Chest Port 1 View  05/13/2013   CLINICAL DATA:  Increasing weakness, shortness of breath  EXAM: PORTABLE CHEST - 1 VIEW  COMPARISON:  03/18/2013  FINDINGS: Degraded by rotation. Prominent cardiomediastinal contours. Aortic atherosclerosis. Elevated left hemidiaphragm. Status post CABG. Mild left lung base opacity, favored to reflect atelectasis or scar. Mild haziness to the right lower lung. Status post median sternotomy. Surgical clips project over midline.  IMPRESSION: Degraded by rotation. Small right effusion/consolidation not excluded. Recommend PA and lateral projections when the patient can tolerate.   Electronically Signed   By: Jearld Lesch M.D.   On: 05/13/2013 01:38    Microbiology: Recent Results (from the past 240 hour(s))  MRSA PCR SCREENING     Status: Abnormal   Collection Time    05/13/13 12:54 PM      Result Value Range Status   MRSA by PCR POSITIVE (*) NEGATIVE Final   Comment:            The GeneXpert MRSA Assay (FDA     approved for NASAL specimens     only), is one component of a     comprehensive MRSA colonization     surveillance program. It is not     intended to diagnose MRSA     infection nor to guide or     monitor treatment for     MRSA infections.     RESULT CALLED TO, READ BACK BY AND VERIFIED WITHChancy Milroy RN [id number] 05/13/13 A [facility]  CULTURE, BLOOD  (ROUTINE X 2)  Status: None   Collection Time    05/20/13  7:12 AM      Result Value Range Status   Specimen Description BLOOD LEFT ANTECUBITAL   Final   Special Requests BOTTLES DRAWN AEROBIC AND ANAEROBIC   Final   Culture  Setup Time     Final   Value: 05/20/2013 11:42     Performed at [facility]   Culture     Final   Value:        BLOOD CULTURE RECEIVED NO GROWTH TO DATE CULTURE WILL BE HELD FOR 5 DAYS BEFORE ISSUING A FINAL NEGATIVE REPORT     Performed at [facility]   Report Status PENDING   Incomplete  CULTURE, BLOOD (ROUTINE X 2)     Status: None   Collection Time    05/20/13  7:12 AM      Result Value Range Status   Specimen Description BLOOD RIGHT HAND   Final   Special Requests BOTTLES DRAWN AEROBIC AND ANAEROBIC   Final   Culture  Setup Time     Final   Value: 05/20/2013 11:42     Performed at [facility]   Culture     Final   Value:        BLOOD CULTURE RECEIVED NO GROWTH TO DATE CULTURE WILL BE HELD FOR 5 DAYS BEFORE ISSUING A FINAL NEGATIVE REPORT     Performed at [facility]   Report Status PENDING   Incomplete  CLOSTRIDIUM DIFFICILE BY PCR     Status: Abnormal   Collection Time    05/21/13 12:31 AM      Result Value Range Status   C difficile by pcr POSITIVE (*) NEGATIVE Final   Comment: CRITICAL RESULT CALLED TO, READ BACK BY AND VERIFIED WITH:     REYNOLDS RN 9:25 05/21/13 ([email or web address])     Performed at [facility]     Labs: Basic Metabolic Panel:  Recent Labs Lab 05/17/13 0530 05/20/13 0619 05/21/13 0448 05/22/13 0456 05/23/13 0440  NA 138 140 135 137 136  K 3.2* 4.0 3.5 3.5 3.4*  CL 99 98 98 99 98  CO2 29  --  26 27 28   GLUCOSE 169* 264* 247* 159* 133*  BUN 22 35* 39* 38* 32*  CREATININE 1.46* 1.60* 1.40* 1.17 1.00  CALCIUM 8.6  --  8.7 8.7 8.8  MG 2.3  --   --   --   --    Liver Function Tests:  Recent Labs Lab 05/17/13 0530  AST 22  ALT 17  ALKPHOS 47  BILITOT 0.9  PROT 7.4   ALBUMIN 3.3*   No results found for this basename: LIPASE, AMYLASE,  in the last 168 hours No results found for this basename: AMMONIA,  in the last 168 hours CBC:  Recent Labs Lab 05/17/13 0530 05/20/13 0606 05/20/13 0619 05/21/13 0448 05/22/13 0456 05/23/13 0440  WBC 6.2 20.3*  --  15.7* 13.7* 9.1  NEUTROABS 3.1 18.2*  --   --   --   --   HGB 11.1* 12.7* 13.6 11.3* 10.9* 11.1*  HCT 33.0* 37.3* 40.0 33.4* 32.1* 31.9*  MCV 97.3 97.9  --  97.9 96.7 96.4  PLT 103* 159  --  139* 130* 132*   Cardiac Enzymes:  Recent Labs Lab 05/16/13 1350 05/20/13 0606  TROPONINI 0.45* <0.30   BNP: BNP (last 3 results)  Recent Labs  05/14/13 0930 05/16/13 1403 05/20/13 0606  PROBNP 3540.0* 2658.0* 1575.0*   CBG:  Recent Labs Lab 05/22/13 0736 05/22/13 1159 05/22/13 1727 05/22/13 2226 05/23/13 0754  GLUCAP 143* 289* 182* 193* 140*       Signed:  Khloe Hunkele C  [facility] 05/23/2013, 11:39 AM

## 2013-05-26 LAB — CULTURE, BLOOD (ROUTINE X 2): Culture: NO GROWTH

## 2013-06-03 ENCOUNTER — Inpatient Hospital Stay: Payer: Medicare Other | Admitting: Internal Medicine

## 2013-06-11 ENCOUNTER — Encounter (HOSPITAL_COMMUNITY): Payer: Self-pay | Admitting: Emergency Medicine

## 2013-06-11 ENCOUNTER — Emergency Department (HOSPITAL_COMMUNITY)
Admission: EM | Admit: 2013-06-11 | Discharge: 2013-06-12 | Disposition: A | Discharge status: Home / Self Care | Payer: Medicare Other | Attending: Emergency Medicine | Admitting: Emergency Medicine

## 2013-06-11 DIAGNOSIS — Z8701 Personal history of pneumonia (recurrent): Secondary | ICD-10-CM | POA: Insufficient documentation

## 2013-06-11 DIAGNOSIS — I2510 Atherosclerotic heart disease of native coronary artery without angina pectoris: Secondary | ICD-10-CM | POA: Insufficient documentation

## 2013-06-11 DIAGNOSIS — I120 Hypertensive chronic kidney disease with stage 5 chronic kidney disease or end stage renal disease: Secondary | ICD-10-CM | POA: Insufficient documentation

## 2013-06-11 DIAGNOSIS — Z87891 Personal history of nicotine dependence: Secondary | ICD-10-CM | POA: Insufficient documentation

## 2013-06-11 DIAGNOSIS — R531 Weakness: Secondary | ICD-10-CM

## 2013-06-11 DIAGNOSIS — R5381 Other malaise: Secondary | ICD-10-CM | POA: Insufficient documentation

## 2013-06-11 DIAGNOSIS — Z79899 Other long term (current) drug therapy: Secondary | ICD-10-CM | POA: Insufficient documentation

## 2013-06-11 DIAGNOSIS — Z792 Long term (current) use of antibiotics: Secondary | ICD-10-CM | POA: Insufficient documentation

## 2013-06-11 DIAGNOSIS — I252 Old myocardial infarction: Secondary | ICD-10-CM | POA: Insufficient documentation

## 2013-06-11 DIAGNOSIS — E785 Hyperlipidemia, unspecified: Secondary | ICD-10-CM | POA: Insufficient documentation

## 2013-06-11 DIAGNOSIS — Z8619 Personal history of other infectious and parasitic diseases: Secondary | ICD-10-CM | POA: Insufficient documentation

## 2013-06-11 DIAGNOSIS — Z951 Presence of aortocoronary bypass graft: Secondary | ICD-10-CM | POA: Insufficient documentation

## 2013-06-11 DIAGNOSIS — I509 Heart failure, unspecified: Secondary | ICD-10-CM | POA: Insufficient documentation

## 2013-06-11 DIAGNOSIS — N189 Chronic kidney disease, unspecified: Secondary | ICD-10-CM | POA: Insufficient documentation

## 2013-06-11 DIAGNOSIS — E119 Type 2 diabetes mellitus without complications: Secondary | ICD-10-CM | POA: Insufficient documentation

## 2013-06-11 DIAGNOSIS — R197 Diarrhea, unspecified: Secondary | ICD-10-CM | POA: Insufficient documentation

## 2013-06-11 LAB — CBC WITH DIFFERENTIAL/PLATELET
Basophils Relative: 0 % (ref 0–1)
Eosinophils Absolute: 0.1 10*3/uL (ref 0.0–0.7)
Eosinophils Relative: 2 % (ref 0–5)
HCT: 35 % — ABNORMAL LOW (ref 39.0–52.0)
Hemoglobin: 11.9 g/dL — ABNORMAL LOW (ref 13.0–17.0)
Lymphocytes Relative: 25 % (ref 12–46)
Lymphs Abs: 1.6 10*3/uL (ref 0.7–4.0)
MCH: 33.3 pg (ref 26.0–34.0)
MCV: 98 fL (ref 78.0–100.0)
Monocytes Absolute: 0.7 10*3/uL (ref 0.1–1.0)
Monocytes Relative: 12 % (ref 3–12)
Platelets: 121 10*3/uL — ABNORMAL LOW (ref 150–400)
RBC: 3.57 MIL/uL — ABNORMAL LOW (ref 4.22–5.81)
RDW: 14.3 % (ref 11.5–15.5)
WBC: 6.3 10*3/uL (ref 4.0–10.5)

## 2013-06-11 LAB — URINE MICROSCOPIC-ADD ON

## 2013-06-11 LAB — URINALYSIS, ROUTINE W REFLEX MICROSCOPIC
Glucose, UA: NEGATIVE mg/dL
Protein, ur: NEGATIVE mg/dL
Specific Gravity, Urine: 1.015 (ref 1.005–1.030)
pH: 6.5 (ref 5.0–8.0)

## 2013-06-11 LAB — COMPREHENSIVE METABOLIC PANEL
AST: 18 U/L (ref 0–37)
Albumin: 3.2 g/dL — ABNORMAL LOW (ref 3.5–5.2)
Alkaline Phosphatase: 52 U/L (ref 39–117)
Calcium: 8.9 mg/dL (ref 8.4–10.5)
Chloride: 101 mEq/L (ref 96–112)
Creatinine, Ser: 1.13 mg/dL (ref 0.50–1.35)
Potassium: 4.6 mEq/L (ref 3.7–5.3)
Total Bilirubin: 0.4 mg/dL (ref 0.3–1.2)

## 2013-06-11 MED ORDER — SODIUM CHLORIDE 0.9 % IV SOLN
1000.0000 mL | INTRAVENOUS | Status: DC
  Administered 2013-06-12: 1000 mL via INTRAVENOUS

## 2013-06-11 MED ORDER — SODIUM CHLORIDE 0.9 % IV SOLN
1000.0000 mL | Freq: Once | INTRAVENOUS | Status: AC
  Administered 2013-06-12: 1000 mL via INTRAVENOUS

## 2013-06-11 NOTE — ED Provider Notes (Signed)
CSN: [id number]     Arrival date & time 06/11/13  2053 History   First MD Initiated Contact with Patient 06/11/13 2335     Chief Complaint  Patient presents with  . Diarrhea  . Weakness   (Consider location/radiation/quality/duration/timing/severity/associated sxs/prior Treatment) Patient is a 79 y.o. male presenting with diarrhea and weakness. The history is provided by the patient.  Diarrhea Weakness  He had been discharged the hospital 3 weeks ago and at that time had been having difficulty with Clostridium difficile colitis. He completed a course of vancomycin but continues to have diarrhea. He states he has had one loose bowel movement today. This evening, he had an episode of being extremely weak and dizzy and nearly passing out. This occurred when he was walking from one room in his house to another. He had not been having dizziness before. He denies any chest pain, heaviness, tightness, pressure. He denies any nausea or vomiting. He is feeling better at this point. He did take his blood pressure at home and he states that it was 91/55. EMS reported blood pressure of 80 systolic which improved with a 200 mL of IV fluids. He denies fever, chills, sweats. He states that he has not had any appetite so he hasn't been eating or drinking very much.  Past Medical History  Diagnosis Date  . Myocardial infarct   . Diabetes mellitus   . Hypertension   . CHF (congestive heart failure)   . Coronary artery disease   . Clostridium difficile diarrhea     recurrent   . Hyperlipidemia   . Pneumonia 04/2011  . Shortness of breath     only with my heart failure "  . Chronic kidney disease   . Dysrhythmia   . Paroxysmal atrial fibrillation    Past Surgical History  Procedure Laterality Date  . Coronary artery bypass graft    . Cholecystectomy    . Cardiac catheterization  10/10/2001  . Cystoscopy, turbt  05/09/2006, 02/19/2006, 04/19/2005, 04/21/2003  . Colonoscopy  01/31/2012    Procedure:  COLONOSCOPY;  Surgeon: Iva Boop, MD;  Location: [facility];  Service: Endoscopy;  Laterality: N/A;  fecal transplant/cynthia snyder will prepare the sample   Family History  Problem Relation Age of Onset  . Stomach cancer Mother   . Coronary artery disease Father   . Diabetes Son   . Colon cancer Neg Hx    History  Substance Use Topics  . Smoking status: Former Smoker -- 1.00 packs/day    Types: Cigarettes    Quit date: 05/08/1974  . Smokeless tobacco: Never Used  . Alcohol Use: No     Comment: occasionally    Review of Systems  Gastrointestinal: Positive for diarrhea.  Neurological: Positive for weakness.  All other systems reviewed and are negative.    Allergies  Morphine and related  Home Medications   Current Outpatient Rx  Name  Route  Sig  Dispense  Refill  . diphenhydramine-acetaminophen (TYLENOL PM) 25-500 MG TABS   Oral   Take 1 tablet by mouth at bedtime as needed (sleep).         . ezetimibe (ZETIA) 10 MG tablet   Oral   Take 10 mg by mouth at bedtime.          . finasteride (PROSCAR) 5 MG tablet   Oral   Take 5 mg by mouth every morning.          . fish oil-omega-3 fatty acids 1000 MG capsule  Oral   Take 1 g by mouth every morning.          . furosemide (LASIX) 20 MG tablet   Oral   Take 1 tablet (20 mg total) by mouth every evening.   30 tablet   0   . furosemide (LASIX) 40 MG tablet   Oral   Take 1 tablet (40 mg total) by mouth every morning.   30 tablet   0   . glyBURIDE (DIABETA) 5 MG tablet   Oral   Take 2.5-5 mg by mouth daily as needed (high blood sugar).          Marland Kitchen levothyroxine (SYNTHROID, LEVOTHROID) 50 MCG tablet   Oral   Take 50 mcg by mouth daily before breakfast.         . LORazepam (ATIVAN) 0.5 MG tablet   Oral   Take 0.5 mg by mouth at bedtime as needed. For insomnia.         . mirtazapine (REMERON) 15 MG tablet   Oral   Take 15 mg by mouth at bedtime.         . Multiple Vitamin  (MULTIVITAMIN WITH MINERALS) TABS   Oral   Take 1 tablet by mouth daily.         . Multiple Vitamins-Minerals (ICAPS PO)   Oral   Take 1 capsule by mouth daily.          . potassium chloride (K-DUR,KLOR-CON) 10 MEQ tablet   Oral   Take 10 mEq by mouth 3 (three) times daily.         . vancomycin (VANCOCIN) 50 mg/mL oral solution   Oral   Take 10 mLs (500 mg total) by mouth every 6 (six) hours.   560 mL   0     For 2weeks    BP 139/67  Pulse 91  Temp(Src) 97.9 F (36.6 C) (Oral)  Resp 20  SpO2 97% Physical Exam  Nursing note and vitals reviewed.  79 year old male, resting comfortably and in no acute distress. Vital signs are normal. Oxygen saturation is 97%, which is normal. Head is normocephalic and atraumatic. PERRLA, EOMI. Oropharynx is clear. Neck is nontender and supple without adenopathy or JVD. Back is nontender and there is no CVA tenderness. Lungs are clear without rales, wheezes, or rhonchi. Chest is nontender. Heart has regular rate and rhythm with 3/6 systolic ejection murmur at the cardiac base radiating to the neck. Abdomen is soft, flat, nontender without masses or hepatosplenomegaly and peristalsis is normoactive. Extremities have no cyanosis or edema, full range of motion is present. Skin is warm and dry without rash. Neurologic: Mental status is normal, cranial nerves are intact, there are no motor or sensory deficits.   ED Course  Procedures (including critical care time) Labs Review Results for orders placed during the hospital encounter of 06/11/13  CBC WITH DIFFERENTIAL      Result Value Range   WBC 6.3  4.0 - 10.5 K/uL   RBC 3.57 (*) 4.22 - 5.81 MIL/uL   Hemoglobin 11.9 (*) 13.0 - 17.0 g/dL   HCT 82.9 (*) 56.2 - 13.0 %   MCV 98.0  78.0 - 100.0 fL   MCH 33.3  26.0 - 34.0 pg   MCHC 34.0  30.0 - 36.0 g/dL   RDW 86.5  78.4 - 69.6 %   Platelets 121 (*) 150 - 400 K/uL   Neutrophils Relative % 61  43 - 77 %   Neutro Abs  3.9  1.7 - 7.7  K/uL   Lymphocytes Relative 25  12 - 46 %   Lymphs Abs 1.6  0.7 - 4.0 K/uL   Monocytes Relative 12  3 - 12 %   Monocytes Absolute 0.7  0.1 - 1.0 K/uL   Eosinophils Relative 2  0 - 5 %   Eosinophils Absolute 0.1  0.0 - 0.7 K/uL   Basophils Relative 0  0 - 1 %   Basophils Absolute 0.0  0.0 - 0.1 K/uL  COMPREHENSIVE METABOLIC PANEL      Result Value Range   Sodium 138  137 - 147 mEq/L   Potassium 4.6  3.7 - 5.3 mEq/L   Chloride 101  96 - 112 mEq/L   CO2 26  19 - 32 mEq/L   Glucose, Bld 151 (*) 70 - 99 mg/dL   BUN 23  6 - 23 mg/dL   Creatinine, Ser 4.09  0.50 - 1.35 mg/dL   Calcium 8.9  8.4 - 81.1 mg/dL   Total Protein 7.2  6.0 - 8.3 g/dL   Albumin 3.2 (*) 3.5 - 5.2 g/dL   AST 18  0 - 37 U/L   ALT 10  0 - 53 U/L   Alkaline Phosphatase 52  39 - 117 U/L   Total Bilirubin 0.4  0.3 - 1.2 mg/dL   GFR calc non Af Amer 51 (*) >90 mL/min   GFR calc Af Amer 59 (*) >90 mL/min  URINALYSIS, ROUTINE W REFLEX MICROSCOPIC      Result Value Range   Color, Urine YELLOW  YELLOW   APPearance CLOUDY (*) CLEAR   Specific Gravity, Urine 1.015  1.005 - 1.030   pH 6.5  5.0 - 8.0   Glucose, UA NEGATIVE  NEGATIVE mg/dL   Hgb urine dipstick TRACE (*) NEGATIVE   Bilirubin Urine NEGATIVE  NEGATIVE   Ketones, ur NEGATIVE  NEGATIVE mg/dL   Protein, ur NEGATIVE  NEGATIVE mg/dL   Urobilinogen, UA 0.2  0.0 - 1.0 mg/dL   Nitrite NEGATIVE  NEGATIVE   Leukocytes, UA LARGE (*) NEGATIVE  URINE MICROSCOPIC-ADD ON      Result Value Range   WBC, UA TOO NUMEROUS TO COUNT  <3 WBC/hpf   RBC / HPF 0-2  <3 RBC/hpf   Urine-Other MANY YEAST    CG4 I-STAT (LACTIC ACID)      Result Value Range   Lactic Acid, Venous 2.45 (*) 0.5 - 2.2 mmol/L  POCT I-STAT TROPONIN I      Result Value Range   Troponin i, poc 0.02  0.00 - 0.08 ng/mL   Comment 3            MDM   1. Weakness    Episode of hypotension which seems to have resolved. Orthostatic vital signs will be checked. Old records are reviewed and he had been admitted  to the hospital for pneumonia and had clostridium difficile colitis with discharge on December 9.  Workup is unremarkable. Hemoglobin has actually increased from the last one. Thrombocytopenia is present which is chronic. Orthostatic vital signs showed no significant drop in blood pressure or rise in pulse. He was able to ambulate in the ED without difficulty. He will be discharged.  Dione Booze, MD 06/12/13 [phone]

## 2013-06-11 NOTE — ED Notes (Signed)
Per EMS: Pt from home, lives with son. Pt dx c-diff 1 month ago, discharged with vanc and completed it recently. Pt continues to have weakness and diarrhea, not taking in enough fluids.

## 2013-06-11 NOTE — ED Notes (Signed)
Bed: ZO10 Expected date: 06/11/13 Expected time: 8:26 PM Means of arrival: Ambulance Comments: 79yr old/recent cdiff

## 2013-06-11 NOTE — ED Notes (Signed)
Per EMS: Initial BP was 80 palpated, given 200 mL NS IV, last BP 132/83.

## 2013-06-12 LAB — CG4 I-STAT (LACTIC ACID): LACTIC ACID, VENOUS: 2.45 mmol/L — AB (ref 0.5–2.2)

## 2013-06-12 LAB — POCT I-STAT TROPONIN I: Troponin i, poc: 0.02 ng/mL (ref 0.00–0.08)

## 2013-06-12 NOTE — Discharge Instructions (Signed)
Make sure that you drink enough fluids. Use nutritional supplements to try and get her strength back up. Follow up with her PCP. Return to the ED if symptoms are getting worse.

## 2013-06-13 LAB — URINE CULTURE: Colony Count: 75000

## 2013-06-14 ENCOUNTER — Encounter (HOSPITAL_COMMUNITY): Payer: Self-pay | Admitting: Emergency Medicine

## 2013-06-14 ENCOUNTER — Emergency Department (HOSPITAL_COMMUNITY): Payer: Medicare Other

## 2013-06-14 ENCOUNTER — Inpatient Hospital Stay (HOSPITAL_COMMUNITY)
Admission: EM | Admit: 2013-06-14 | Discharge: 2013-06-19 | DRG: 871 | Disposition: A | Discharge status: Skilled Nursing Facility | Payer: Medicare Other | Attending: Internal Medicine | Admitting: Internal Medicine

## 2013-06-14 DIAGNOSIS — E785 Hyperlipidemia, unspecified: Secondary | ICD-10-CM

## 2013-06-14 DIAGNOSIS — Z951 Presence of aortocoronary bypass graft: Secondary | ICD-10-CM

## 2013-06-14 DIAGNOSIS — R5383 Other fatigue: Secondary | ICD-10-CM

## 2013-06-14 DIAGNOSIS — Z66 Do not resuscitate: Secondary | ICD-10-CM | POA: Diagnosis present

## 2013-06-14 DIAGNOSIS — I214 Non-ST elevation (NSTEMI) myocardial infarction: Secondary | ICD-10-CM

## 2013-06-14 DIAGNOSIS — E039 Hypothyroidism, unspecified: Secondary | ICD-10-CM | POA: Diagnosis present

## 2013-06-14 DIAGNOSIS — Z87891 Personal history of nicotine dependence: Secondary | ICD-10-CM

## 2013-06-14 DIAGNOSIS — G4700 Insomnia, unspecified: Secondary | ICD-10-CM | POA: Diagnosis present

## 2013-06-14 DIAGNOSIS — E118 Type 2 diabetes mellitus with unspecified complications: Secondary | ICD-10-CM

## 2013-06-14 DIAGNOSIS — R6521 Severe sepsis with septic shock: Secondary | ICD-10-CM | POA: Diagnosis present

## 2013-06-14 DIAGNOSIS — I129 Hypertensive chronic kidney disease with stage 1 through stage 4 chronic kidney disease, or unspecified chronic kidney disease: Secondary | ICD-10-CM | POA: Diagnosis present

## 2013-06-14 DIAGNOSIS — N183 Chronic kidney disease, stage 3 unspecified: Secondary | ICD-10-CM | POA: Diagnosis present

## 2013-06-14 DIAGNOSIS — IMO0002 Reserved for concepts with insufficient information to code with codable children: Secondary | ICD-10-CM | POA: Diagnosis present

## 2013-06-14 DIAGNOSIS — A0472 Enterocolitis due to Clostridium difficile, not specified as recurrent: Secondary | ICD-10-CM

## 2013-06-14 DIAGNOSIS — E872 Acidosis, unspecified: Secondary | ICD-10-CM | POA: Diagnosis present

## 2013-06-14 DIAGNOSIS — I252 Old myocardial infarction: Secondary | ICD-10-CM

## 2013-06-14 DIAGNOSIS — Z8249 Family history of ischemic heart disease and other diseases of the circulatory system: Secondary | ICD-10-CM

## 2013-06-14 DIAGNOSIS — I5023 Acute on chronic systolic (congestive) heart failure: Secondary | ICD-10-CM | POA: Diagnosis present

## 2013-06-14 DIAGNOSIS — A414 Sepsis due to anaerobes: Principal | ICD-10-CM | POA: Diagnosis present

## 2013-06-14 DIAGNOSIS — I2510 Atherosclerotic heart disease of native coronary artery without angina pectoris: Secondary | ICD-10-CM | POA: Diagnosis present

## 2013-06-14 DIAGNOSIS — R509 Fever, unspecified: Secondary | ICD-10-CM | POA: Diagnosis present

## 2013-06-14 DIAGNOSIS — D649 Anemia, unspecified: Secondary | ICD-10-CM | POA: Diagnosis present

## 2013-06-14 DIAGNOSIS — N400 Enlarged prostate without lower urinary tract symptoms: Secondary | ICD-10-CM | POA: Diagnosis present

## 2013-06-14 DIAGNOSIS — K921 Melena: Secondary | ICD-10-CM

## 2013-06-14 DIAGNOSIS — I5043 Acute on chronic combined systolic (congestive) and diastolic (congestive) heart failure: Secondary | ICD-10-CM

## 2013-06-14 DIAGNOSIS — I4891 Unspecified atrial fibrillation: Secondary | ICD-10-CM

## 2013-06-14 DIAGNOSIS — I10 Essential (primary) hypertension: Secondary | ICD-10-CM

## 2013-06-14 DIAGNOSIS — D696 Thrombocytopenia, unspecified: Secondary | ICD-10-CM | POA: Diagnosis present

## 2013-06-14 DIAGNOSIS — R6520 Severe sepsis without septic shock: Secondary | ICD-10-CM

## 2013-06-14 DIAGNOSIS — E1165 Type 2 diabetes mellitus with hyperglycemia: Secondary | ICD-10-CM | POA: Diagnosis present

## 2013-06-14 DIAGNOSIS — A0471 Enterocolitis due to Clostridium difficile, recurrent: Secondary | ICD-10-CM | POA: Diagnosis present

## 2013-06-14 DIAGNOSIS — D72829 Elevated white blood cell count, unspecified: Secondary | ICD-10-CM | POA: Diagnosis present

## 2013-06-14 DIAGNOSIS — Z833 Family history of diabetes mellitus: Secondary | ICD-10-CM

## 2013-06-14 DIAGNOSIS — R5381 Other malaise: Secondary | ICD-10-CM

## 2013-06-14 DIAGNOSIS — IMO0001 Reserved for inherently not codable concepts without codable children: Secondary | ICD-10-CM | POA: Diagnosis present

## 2013-06-14 DIAGNOSIS — J189 Pneumonia, unspecified organism: Secondary | ICD-10-CM

## 2013-06-14 DIAGNOSIS — L0390 Cellulitis, unspecified: Secondary | ICD-10-CM

## 2013-06-14 DIAGNOSIS — A419 Sepsis, unspecified organism: Secondary | ICD-10-CM | POA: Diagnosis present

## 2013-06-14 DIAGNOSIS — R6510 Systemic inflammatory response syndrome (SIRS) of non-infectious origin without acute organ dysfunction: Secondary | ICD-10-CM

## 2013-06-14 DIAGNOSIS — I480 Paroxysmal atrial fibrillation: Secondary | ICD-10-CM | POA: Diagnosis present

## 2013-06-14 DIAGNOSIS — J9620 Acute and chronic respiratory failure, unspecified whether with hypoxia or hypercapnia: Secondary | ICD-10-CM | POA: Diagnosis present

## 2013-06-14 HISTORY — DX: Do not resuscitate: Z66

## 2013-06-14 LAB — COMPREHENSIVE METABOLIC PANEL
ALT: 11 U/L (ref 0–53)
AST: 31 U/L (ref 0–37)
Albumin: 3.3 g/dL — ABNORMAL LOW (ref 3.5–5.2)
Alkaline Phosphatase: 56 U/L (ref 39–117)
BUN: 15 mg/dL (ref 6–23)
CALCIUM: 9 mg/dL (ref 8.4–10.5)
CO2: 23 mEq/L (ref 19–32)
Chloride: 103 mEq/L (ref 96–112)
Creatinine, Ser: 0.96 mg/dL (ref 0.50–1.35)
GFR calc non Af Amer: 66 mL/min — ABNORMAL LOW (ref >90)
GFR, EST AFRICAN AMERICAN: 76 mL/min — AB (ref >90)
GLUCOSE: 170 mg/dL — AB (ref 70–99)
Potassium: 4.7 mEq/L (ref 3.7–5.3)
Sodium: 139 mEq/L (ref 137–147)
TOTAL PROTEIN: 7.4 g/dL (ref 6.0–8.3)
Total Bilirubin: 0.8 mg/dL (ref 0.3–1.2)

## 2013-06-14 LAB — URINALYSIS, ROUTINE W REFLEX MICROSCOPIC
Bilirubin Urine: NEGATIVE
Glucose, UA: NEGATIVE mg/dL
Ketones, ur: NEGATIVE mg/dL
Nitrite: NEGATIVE
PROTEIN: 30 mg/dL — AB
SPECIFIC GRAVITY, URINE: 1.015 (ref 1.005–1.030)
UROBILINOGEN UA: 0.2 mg/dL (ref 0.0–1.0)
pH: 6 (ref 5.0–8.0)

## 2013-06-14 LAB — CG4 I-STAT (LACTIC ACID): Lactic Acid, Venous: 2.46 mmol/L — ABNORMAL HIGH (ref 0.5–2.2)

## 2013-06-14 LAB — PRO B NATRIURETIC PEPTIDE: Pro B Natriuretic peptide (BNP): 879.1 pg/mL — ABNORMAL HIGH (ref 0–450)

## 2013-06-14 LAB — CBC WITH DIFFERENTIAL/PLATELET
BASOS ABS: 0 10*3/uL (ref 0.0–0.1)
Basophils Relative: 0 % (ref 0–1)
EOS PCT: 0 % (ref 0–5)
Eosinophils Absolute: 0 10*3/uL (ref 0.0–0.7)
HCT: 37.3 % — ABNORMAL LOW (ref 39.0–52.0)
Hemoglobin: 12.3 g/dL — ABNORMAL LOW (ref 13.0–17.0)
LYMPHS ABS: 0.7 10*3/uL (ref 0.7–4.0)
Lymphocytes Relative: 6 % — ABNORMAL LOW (ref 12–46)
MCH: 32.6 pg (ref 26.0–34.0)
MCHC: 33 g/dL (ref 30.0–36.0)
MCV: 98.9 fL (ref 78.0–100.0)
Monocytes Absolute: 1.2 10*3/uL — ABNORMAL HIGH (ref 0.1–1.0)
Monocytes Relative: 11 % (ref 3–12)
Neutro Abs: 9.1 10*3/uL — ABNORMAL HIGH (ref 1.7–7.7)
Neutrophils Relative %: 83 % — ABNORMAL HIGH (ref 43–77)
PLATELETS: 103 10*3/uL — AB (ref 150–400)
RBC: 3.77 MIL/uL — ABNORMAL LOW (ref 4.22–5.81)
RDW: 14.8 % (ref 11.5–15.5)
WBC: 11 10*3/uL — ABNORMAL HIGH (ref 4.0–10.5)

## 2013-06-14 LAB — GLUCOSE, CAPILLARY
Glucose-Capillary: 208 mg/dL — ABNORMAL HIGH (ref 70–99)
Glucose-Capillary: 232 mg/dL — ABNORMAL HIGH (ref 70–99)

## 2013-06-14 LAB — INFLUENZA PANEL BY PCR (TYPE A & B)
H1N1 flu by pcr: NOT DETECTED
INFLAPCR: NEGATIVE
INFLBPCR: NEGATIVE

## 2013-06-14 LAB — URINE MICROSCOPIC-ADD ON

## 2013-06-14 LAB — TROPONIN I: Troponin I: <0.3 ng/mL (ref <0.30)

## 2013-06-14 LAB — CLOSTRIDIUM DIFFICILE BY PCR: CDIFFPCR: POSITIVE — AB

## 2013-06-14 MED ORDER — SODIUM CHLORIDE 0.9 % IJ SOLN
3.0000 mL | Freq: Two times a day (BID) | INTRAMUSCULAR | Status: DC
  Administered 2013-06-17 – 2013-06-19 (×4): 3 mL via INTRAVENOUS

## 2013-06-14 MED ORDER — ACETAMINOPHEN 650 MG RE SUPP
650.0000 mg | Freq: Four times a day (QID) | RECTAL | Status: DC | PRN
Start: 2013-06-14 — End: 2013-06-19

## 2013-06-14 MED ORDER — EZETIMIBE 10 MG PO TABS
10.0000 mg | ORAL_TABLET | Freq: Every day | ORAL | Status: DC
  Administered 2013-06-14: 10 mg via ORAL
  Filled 2013-06-14 (×2): qty 1

## 2013-06-14 MED ORDER — CEFTRIAXONE SODIUM 1 G IJ SOLR
1.0000 g | INTRAMUSCULAR | Status: DC
  Filled 2013-06-14: qty 10

## 2013-06-14 MED ORDER — TAMSULOSIN HCL 0.4 MG PO CAPS
0.4000 mg | ORAL_CAPSULE | Freq: Every day | ORAL | Status: DC
Start: 2013-06-14 — End: 2013-06-19
  Administered 2013-06-14 – 2013-06-18 (×5): 0.4 mg via ORAL
  Filled 2013-06-14 (×6): qty 1

## 2013-06-14 MED ORDER — SODIUM CHLORIDE 0.9 % IV BOLUS (SEPSIS)
250.0000 mL | INTRAVENOUS | Status: DC | PRN
  Administered 2013-06-14 (×3): 250 mL via INTRAVENOUS

## 2013-06-14 MED ORDER — ZOLPIDEM TARTRATE 5 MG PO TABS
5.0000 mg | ORAL_TABLET | Freq: Every evening | ORAL | Status: DC | PRN
  Administered 2013-06-14 – 2013-06-18 (×4): 5 mg via ORAL
  Filled 2013-06-14 (×4): qty 1

## 2013-06-14 MED ORDER — FINASTERIDE 5 MG PO TABS
5.0000 mg | ORAL_TABLET | ORAL | Status: DC
  Administered 2013-06-16 – 2013-06-19 (×4): 5 mg via ORAL
  Filled 2013-06-14 (×6): qty 1

## 2013-06-14 MED ORDER — LEVOTHYROXINE SODIUM 50 MCG PO TABS
50.0000 ug | ORAL_TABLET | Freq: Every day | ORAL | Status: DC
  Filled 2013-06-14 (×2): qty 1

## 2013-06-14 MED ORDER — INSULIN ASPART 100 UNIT/ML ~~LOC~~ SOLN
0.0000 [IU] | Freq: Three times a day (TID) | SUBCUTANEOUS | Status: DC
  Administered 2013-06-14: 18:00:00 3 [IU] via SUBCUTANEOUS
  Administered 2013-06-15: 17:00:00 2 [IU] via SUBCUTANEOUS
  Administered 2013-06-15: 3 [IU] via SUBCUTANEOUS
  Administered 2013-06-15 – 2013-06-16 (×2): 2 [IU] via SUBCUTANEOUS
  Administered 2013-06-16: 08:00:00 1 [IU] via SUBCUTANEOUS
  Administered 2013-06-16: 2 [IU] via SUBCUTANEOUS
  Administered 2013-06-17: 1 [IU] via SUBCUTANEOUS
  Administered 2013-06-17: 2 [IU] via SUBCUTANEOUS
  Administered 2013-06-17: 08:00:00 1 [IU] via SUBCUTANEOUS
  Administered 2013-06-18 (×2): 2 [IU] via SUBCUTANEOUS
  Administered 2013-06-18: 08:00:00 1 [IU] via SUBCUTANEOUS
  Administered 2013-06-19: 2 [IU] via SUBCUTANEOUS
  Administered 2013-06-19: 12:00:00 3 [IU] via SUBCUTANEOUS

## 2013-06-14 MED ORDER — METRONIDAZOLE IN NACL 5-0.79 MG/ML-% IV SOLN
500.0000 mg | Freq: Three times a day (TID) | INTRAVENOUS | Status: DC
Start: 2013-06-14 — End: 2013-06-17
  Administered 2013-06-14 – 2013-06-17 (×8): 500 mg via INTRAVENOUS
  Filled 2013-06-14 (×10): qty 100

## 2013-06-14 MED ORDER — DEXTROSE 5 % IV SOLN
1.0000 g | INTRAVENOUS | Status: DC
  Administered 2013-06-14 – 2013-06-15 (×2): 1 g via INTRAVENOUS
  Filled 2013-06-14 (×2): qty 1

## 2013-06-14 MED ORDER — SODIUM CHLORIDE 0.9 % IV SOLN: INTRAVENOUS | Status: DC

## 2013-06-14 MED ORDER — ENOXAPARIN SODIUM 40 MG/0.4ML ~~LOC~~ SOLN
40.0000 mg | SUBCUTANEOUS | Status: DC
  Administered 2013-06-14: 19:00:00 40 mg via SUBCUTANEOUS
  Filled 2013-06-14 (×2): qty 0.4

## 2013-06-14 MED ORDER — VANCOMYCIN 50 MG/ML ORAL SOLUTION
125.0000 mg | Freq: Four times a day (QID) | ORAL | Status: DC
  Administered 2013-06-14: 125 mg via ORAL
  Filled 2013-06-14 (×4): qty 2.5

## 2013-06-14 MED ORDER — ACETAMINOPHEN 325 MG PO TABS
650.0000 mg | ORAL_TABLET | Freq: Once | ORAL | Status: AC
  Administered 2013-06-14: 650 mg via ORAL
  Filled 2013-06-14: qty 2

## 2013-06-14 MED ORDER — SODIUM CHLORIDE 0.9 % IV BOLUS (SEPSIS)
500.0000 mL | Freq: Once | INTRAVENOUS | Status: AC
  Administered 2013-06-14: 500 mL via INTRAVENOUS

## 2013-06-14 MED ORDER — MIRTAZAPINE 15 MG PO TABS
15.0000 mg | ORAL_TABLET | Freq: Every day | ORAL | Status: DC
Start: 2013-06-14 — End: 2013-06-19
  Administered 2013-06-14 – 2013-06-18 (×5): 15 mg via ORAL
  Filled 2013-06-14 (×6): qty 1

## 2013-06-14 MED ORDER — VANCOMYCIN HCL 10 G IV SOLR
1250.0000 mg | INTRAVENOUS | Status: DC
  Administered 2013-06-14 – 2013-06-15 (×2): 1250 mg via INTRAVENOUS
  Filled 2013-06-14 (×2): qty 1250

## 2013-06-14 MED ORDER — ACETAMINOPHEN 325 MG PO TABS
650.0000 mg | ORAL_TABLET | Freq: Four times a day (QID) | ORAL | Status: DC | PRN
  Administered 2013-06-14 – 2013-06-15 (×2): 650 mg via ORAL
  Filled 2013-06-14 (×3): qty 2

## 2013-06-14 MED ORDER — VANCOMYCIN 50 MG/ML ORAL SOLUTION
500.0000 mg | Freq: Four times a day (QID) | ORAL | Status: DC
  Administered 2013-06-14 – 2013-06-18 (×14): 500 mg via ORAL
  Filled 2013-06-14 (×18): qty 10

## 2013-06-14 NOTE — Consult Note (Signed)
[facility]  Total days of antibiotics 1        Day 1 vanco        Day 1 cefepime        Day 1 oral vanco       Reason for Consult: sepsis/ concern for recurrent cdiff    Referring Physician: short  Principal Problem:   SIRS (systemic inflammatory response syndrome) Active Problems:   CAD (coronary artery disease)   Paroxysmal a-fib   DM (diabetes mellitus), type 2, uncontrolled with complications   Hypertension   Hypothyroidism   Recurrent colitis due to Clostridium difficile   Anemia   Thrombocytopenia   Fever    HPI: David Davidson is a 80 y.o. male  male with history of CAD s/p MI and CABG, chronic systolic heart failure with EF of 35%, severe aortic stenosis, paroxysmal atrial flutter, HTN, HLD, DM, CKD stage 3, recurrent C. Diff diarrhea treated with FMT in Aug 2013, but had recurrence after being retreated for cellulitis in early December. (dec 2-6th & Dec 9-12th) where he was discharged home on 2 wks of oral vancomycin where he would have 1-2 soft liquidy stools/per day. He presented to the ED on December 31 with progressive weakness, near syncope, found to be hypotensive, was given IV fluids and then sent home. His son found him weak, with chills,  with altered mental status today and thus taken back to the Ed for admission on 06/14/13. The patient denies any sinus congestion, rhinorrhea, sore throat, cough, shortness of breath, nausea, vomiting, abdominal pain. He has not had a change in his diarrhea. He denies any change in his urinary habits or dysuria. He denies any skin ulcers or infections. No myalgias, no sick contacts. In Ed, he was febrile to 100.6,  White blood cell count 11 with mild left shift. Chest x-ray shows some cephalization and urinalysis was similar to that obtained several days ago. He was given some gentle fluids and Tylenol in the emergency department. Flu pcr negative.  He is being admitted for possible sepsis, currently meet  SIRS criteria. " i feel so bad". His daughter reports that he had sudden onset of rigors on day of admit. Had 1 bout of explosive diarrhea on admit.   Past Medical History  Diagnosis Date  . Myocardial infarct   . Diabetes mellitus   . Hypertension   . CHF (congestive heart failure)   . Coronary artery disease   . Clostridium difficile diarrhea     recurrent   . Hyperlipidemia   . Pneumonia 04/2011  . Shortness of breath     only with my heart failure "  . Chronic kidney disease   . Dysrhythmia   . Paroxysmal atrial fibrillation   . DNR (do not resuscitate)     Allergies:  Allergies  Allergen Reactions  . Morphine And Related Other (See Comments)    Reaction unknown    MEDICATIONS: . ceFEPime (MAXIPIME) IV  1 g Intravenous Q24H  . enoxaparin (LOVENOX) injection  40 mg Subcutaneous Q24H  . ezetimibe  10 mg Oral QHS  . [START ON 06/15/2013] finasteride  5 mg Oral BH-q7a  . insulin aspart  0-9 Units Subcutaneous TID WC  . [START ON 06/15/2013] levothyroxine  50 mcg Oral QAC breakfast  . mirtazapine  15 mg Oral QHS  . sodium chloride  3 mL Intravenous Q12H  . tamsulosin  0.4 mg Oral QPC supper  . vancomycin  1,250  mg Intravenous Q24H  . vancomycin  125 mg Oral Q6H    History  Substance Use Topics  . Smoking status: Former Smoker -- 1.00 packs/day    Types: Cigarettes    Quit date: 05/08/1974  . Smokeless tobacco: Never Used  . Alcohol Use: No     Comment: occasionally    Family History  Problem Relation Age of Onset  . Stomach cancer Mother   . Coronary artery disease Father   . Diabetes Son   . Colon cancer Neg Hx    Review of Systems  Unable to answer due to AMS     OBJECTIVE: Temp:  [98.1 F (36.7 C)-101.7 F (38.7 C)] 99.2 F (37.3 C) (01/03 1708) Pulse Rate:  [75-108] 105 (01/03 1430) Resp:  [13-23] 16 (01/03 1430) BP: (115-118)/(56-61) 117/57 mmHg (01/03 1430) SpO2:  [93 %-98 %] 98 % (01/03 1430) Weight:  [189 lb 2.5 oz (85.8 kg)] 189 lb 2.5 oz  (85.8 kg) (01/03 1430)  Physical Exam  Constitutional: He is oriented to person only. He appears well-developed and well-nourished. In mild distress. Fatigued appearing HENT:  Mouth/Throat: Oropharynx is clear and moist. No oropharyngeal exudate.  Cardiovascular: tachycardic. Exam reveals no gallop and no friction rub.  No murmur heard.  Pulmonary/Chest: Effort normal and breath sounds normal. No respiratory distress. He has no wheezes.  Abdominal: Soft. Bowel sounds are normal. He exhibits no distension. There is no tenderness.  Lymphadenopathy:  no cervical adenopathy.  Neurological: He is alert and oriented to person. Moves all extremities Skin: Skin is warm and moist. Due to diaphoretic. No erythema.     LABS: Results for orders placed during the hospital encounter of 06/14/13 (from the past 48 hour(s))  CBC WITH DIFFERENTIAL     Status: Abnormal   Collection Time    06/14/13 11:50 AM      Result Value Range   WBC 11.0 (*) 4.0 - 10.5 K/uL   RBC 3.77 (*) 4.22 - 5.81 MIL/uL   Hemoglobin 12.3 (*) 13.0 - 17.0 g/dL   HCT 16.6 (*) 06.0 - 04.5 %   MCV 98.9  78.0 - 100.0 fL   MCH 32.6  26.0 - 34.0 pg   MCHC 33.0  30.0 - 36.0 g/dL   RDW 99.7  74.1 - 42.3 %   Platelets 103 (*) 150 - 400 K/uL   Comment: RESULT REPEATED AND VERIFIED     SPECIMEN CHECKED FOR CLOTS     CONSISTENT WITH PREVIOUS RESULT   Neutrophils Relative % 83 (*) 43 - 77 %   Neutro Abs 9.1 (*) 1.7 - 7.7 K/uL   Lymphocytes Relative 6 (*) 12 - 46 %   Lymphs Abs 0.7  0.7 - 4.0 K/uL   Monocytes Relative 11  3 - 12 %   Monocytes Absolute 1.2 (*) 0.1 - 1.0 K/uL   Eosinophils Relative 0  0 - 5 %   Eosinophils Absolute 0.0  0.0 - 0.7 K/uL   Basophils Relative 0  0 - 1 %   Basophils Absolute 0.0  0.0 - 0.1 K/uL  COMPREHENSIVE METABOLIC PANEL     Status: Abnormal   Collection Time    06/14/13 11:50 AM      Result Value Range   Sodium 139  137 - 147 mEq/L   Comment: Please note change in reference range.   Potassium  4.7  3.7 - 5.3 mEq/L   Comment: SLIGHT HEMOLYSIS     HEMOLYSIS AT THIS LEVEL MAY AFFECT RESULT  Please note change in reference range.   Chloride 103  96 - 112 mEq/L   CO2 23  19 - 32 mEq/L   Glucose, Bld 170 (*) 70 - 99 mg/dL   BUN 15  6 - 23 mg/dL   Creatinine, Ser 0.96  0.50 - 1.35 mg/dL   Calcium 9.0  8.4 - 10.5 mg/dL   Total Protein 7.4  6.0 - 8.3 g/dL   Albumin 3.3 (*) 3.5 - 5.2 g/dL   AST 31  0 - 37 U/L   Comment: SLIGHT HEMOLYSIS     HEMOLYSIS AT THIS LEVEL MAY AFFECT RESULT   ALT 11  0 - 53 U/L   Comment: SLIGHT HEMOLYSIS     HEMOLYSIS AT THIS LEVEL MAY AFFECT RESULT   Alkaline Phosphatase 56  39 - 117 U/L   Total Bilirubin 0.8  0.3 - 1.2 mg/dL   GFR calc non Af Amer 66 (*) >90 mL/min   GFR calc Af Amer 76 (*) >90 mL/min   Comment: (NOTE)     The eGFR has been calculated using the CKD EPI equation.     This calculation has not been validated in all clinical situations.     eGFR's persistently <90 mL/min signify possible Chronic Kidney     Disease.  PRO B NATRIURETIC PEPTIDE     Status: Abnormal   Collection Time    06/14/13 11:50 AM      Result Value Range   Pro B Natriuretic peptide (BNP) 879.1 (*) 0 - 450 pg/mL  TROPONIN I     Status: None   Collection Time    06/14/13 11:50 AM      Result Value Range   Troponin I <0.30  <0.30 ng/mL   Comment:            Due to the release kinetics of cTnI,     a negative result within the first hours     of the onset of symptoms does not rule out     myocardial infarction with certainty.     If myocardial infarction is still suspected,     repeat the test at appropriate intervals.  URINALYSIS, ROUTINE W REFLEX MICROSCOPIC     Status: Abnormal   Collection Time    06/14/13 11:54 AM      Result Value Range   Color, Urine YELLOW  YELLOW   APPearance CLOUDY (*) CLEAR   Specific Gravity, Urine 1.015  1.005 - 1.030   pH 6.0  5.0 - 8.0   Glucose, UA NEGATIVE  NEGATIVE mg/dL   Hgb urine dipstick TRACE (*) NEGATIVE    Bilirubin Urine NEGATIVE  NEGATIVE   Ketones, ur NEGATIVE  NEGATIVE mg/dL   Protein, ur 30 (*) NEGATIVE mg/dL   Urobilinogen, UA 0.2  0.0 - 1.0 mg/dL   Nitrite NEGATIVE  NEGATIVE   Leukocytes, UA LARGE (*) NEGATIVE  URINE MICROSCOPIC-ADD ON     Status: None   Collection Time    06/14/13 11:54 AM      Result Value Range   Squamous Epithelial / LPF RARE  RARE   WBC, UA TOO NUMEROUS TO COUNT  <3 WBC/hpf   RBC / HPF 0-2  <3 RBC/hpf   Urine-Other MANY YEAST    CG4 I-STAT (LACTIC ACID)     Status: Abnormal   Collection Time    06/14/13 11:55 AM      Result Value Range   Lactic Acid, Venous 2.46 (*) 0.5 - 2.2 mmol/L  INFLUENZA PANEL BY PCR     Status: None   Collection Time    06/14/13 11:58 AM      Result Value Range   Influenza A By PCR NEGATIVE  NEGATIVE   Influenza B By PCR NEGATIVE  NEGATIVE   H1N1 flu by pcr NOT DETECTED  NOT DETECTED   Comment:            The Xpert Flu assay (FDA approved for     nasal aspirates or washes and     nasopharyngeal swab specimens), is     intended as an aid in the diagnosis of     influenza and should not be used as     a sole basis for treatment.     Performed at [facility], [facility]     Status: Abnormal   Collection Time    06/14/13  4:57 PM      Result Value Range   Glucose-Capillary 208 (*) 70 - 99 mg/dL    MICRO: 1/3 blood cx pending 1/3 urine cx pending 1/3 cdiff pcr pending 1/3 flu pcr negative  IMAGING: Dg Chest Port 1 View  06/14/2013   CLINICAL DATA:  Fever, weakness, body aches  EXAM: PORTABLE CHEST - 1 VIEW  COMPARISON:  None.  FINDINGS: Bilateral diffuse interstitial thickening and peribronchial cuffing most concerning for bronchitis. There is no pleural effusion or pneumothorax. Stable cardiomegaly. Prior CABG.  Degenerative changes of bilateral acromioclavicular joints.  IMPRESSION: Bilateral diffuse interstitial thickening and peribronchial cuffing most concerning for bronchitis.   Electronically Signed    By: Kathreen Devoid   On: 06/14/2013 11:37    Assessment/Plan:  80yo M with MMP, recently treated for recurrent cdiff which he finished 2 wks of oral vanco who presents with fever, weakness, SIRS concern for new infection suc has possible uti vs. Ongoing cdifficile colitis.  - can continue with empiric vanco and cefepime for now and will d/c if other causes found within the next 48hrs/plan to de-escalate and target therapy - likely still recurrent cdifficile infection, continue with high dose oral vanco $RemoveBe'500mg'YtsaHMCBe$  QID. And will add Iv metronidazole for now. De-escalate as he improves.   Elzie Rings [facility] [phone]

## 2013-06-14 NOTE — ED Provider Notes (Signed)
CSN: [id number]     Arrival date & time 06/14/13  1037 History   First MD Initiated Contact with Patient 06/14/13 1038     Chief Complaint  Patient presents with  . Weakness   (Consider location/radiation/quality/duration/timing/severity/associated sxs/prior Treatment) HPI Comments: Patient brought to the ER for evaluation of fever and weakness. Patient was recently seen in the emergency department for weakness and diarrhea. He does have a previous history of C. difficile, treated with vancomycin. Patient was hydrated and did well in the ER, was discharged. Family reports that he did very well for several days, but in the last one to 2 days and started to run a fever and had generalized weakness. Patient now unable to get out of bed because of weakness, was ambulating several days ago.  Patient reports that he continues to have diarrhea, but only slightly. No nausea or vomiting. He has had some cough, mild shortness of breath.  Patient is a 80 y.o. male presenting with weakness.  Weakness Associated symptoms include shortness of breath.    Past Medical History  Diagnosis Date  . Myocardial infarct   . Diabetes mellitus   . Hypertension   . CHF (congestive heart failure)   . Coronary artery disease   . Clostridium difficile diarrhea     recurrent   . Hyperlipidemia   . Pneumonia 04/2011  . Shortness of breath     only with my heart failure "  . Chronic kidney disease   . Dysrhythmia   . Paroxysmal atrial fibrillation    Past Surgical History  Procedure Laterality Date  . Coronary artery bypass graft    . Cholecystectomy    . Cardiac catheterization  10/10/2001  . Cystoscopy, turbt  05/09/2006, 02/19/2006, 04/19/2005, 04/21/2003  . Colonoscopy  01/31/2012    Procedure: COLONOSCOPY;  Surgeon: Iva Boop, MD;  Location: [facility];  Service: Endoscopy;  Laterality: N/A;  fecal transplant/cynthia snyder will prepare the sample   Family History  Problem Relation Age of Onset  .  Stomach cancer Mother   . Coronary artery disease Father   . Diabetes Son   . Colon cancer Neg Hx    History  Substance Use Topics  . Smoking status: Former Smoker -- 1.00 packs/day    Types: Cigarettes    Quit date: 05/08/1974  . Smokeless tobacco: Never Used  . Alcohol Use: No     Comment: occasionally    Review of Systems  Constitutional: Positive for fever and fatigue.  Respiratory: Positive for cough and shortness of breath.   Gastrointestinal: Positive for diarrhea.  Neurological: Positive for weakness.  All other systems reviewed and are negative.    Allergies  Morphine and related  Home Medications   Current Outpatient Rx  Name  Route  Sig  Dispense  Refill  . tamsulosin (FLOMAX) 0.4 MG CAPS capsule   Oral   Take 0.4 mg by mouth.         . warfarin (COUMADIN) 5 MG tablet   Oral   Take 5 mg by mouth daily.         Marland Kitchen zolpidem (AMBIEN) 10 MG tablet   Oral   Take 10 mg by mouth at bedtime as needed for sleep.         . diphenhydramine-acetaminophen (TYLENOL PM) 25-500 MG TABS   Oral   Take 1 tablet by mouth at bedtime as needed (sleep).         . ezetimibe (ZETIA) 10 MG tablet  Oral   Take 10 mg by mouth at bedtime.          . finasteride (PROSCAR) 5 MG tablet   Oral   Take 5 mg by mouth every morning.          . fish oil-omega-3 fatty acids 1000 MG capsule   Oral   Take 1 g by mouth every morning.          . furosemide (LASIX) 20 MG tablet   Oral   Take 1 tablet (20 mg total) by mouth every evening.   30 tablet   0   . furosemide (LASIX) 40 MG tablet   Oral   Take 1 tablet (40 mg total) by mouth every morning.   30 tablet   0   . glyBURIDE (DIABETA) 5 MG tablet   Oral   Take 2.5-5 mg by mouth daily as needed (high blood sugar).          Marland Kitchen levothyroxine (SYNTHROID, LEVOTHROID) 50 MCG tablet   Oral   Take 50 mcg by mouth daily before breakfast.         . LORazepam (ATIVAN) 0.5 MG tablet   Oral   Take 0.5 mg by  mouth at bedtime as needed. For insomnia.         . mirtazapine (REMERON) 15 MG tablet   Oral   Take 15 mg by mouth at bedtime.         . Multiple Vitamin (MULTIVITAMIN WITH MINERALS) TABS   Oral   Take 1 tablet by mouth daily.         . Multiple Vitamins-Minerals (ICAPS PO)   Oral   Take 1 capsule by mouth daily.          . potassium chloride (K-DUR,KLOR-CON) 10 MEQ tablet   Oral   Take 10 mEq by mouth 3 (three) times daily.          BP 118/61  Pulse 107  Temp(Src) 100.6 F (38.1 C) (Oral)  Resp 23  SpO2 97% Physical Exam  Constitutional: He is oriented to person, place, and time. He appears well-developed and well-nourished. No distress.  HENT:  Head: Normocephalic and atraumatic.  Right Ear: Hearing normal.  Left Ear: Hearing normal.  Nose: Nose normal.  Mouth/Throat: Oropharynx is clear and moist and mucous membranes are normal.  Eyes: Conjunctivae and EOM are normal. Pupils are equal, round, and reactive to light.  Neck: Normal range of motion. Neck supple.  Cardiovascular: Regular rhythm, S1 normal and S2 normal.  Exam reveals no gallop and no friction rub.   Murmur heard.  Systolic murmur is present  Pulmonary/Chest: Effort normal and breath sounds normal. No respiratory distress. He exhibits no tenderness.  Abdominal: Soft. Normal appearance and bowel sounds are normal. There is no hepatosplenomegaly. There is no tenderness. There is no rebound, no guarding, no tenderness at McBurney's point and negative Murphy's sign. No hernia.  Musculoskeletal: Normal range of motion.  Neurological: He is alert and oriented to person, place, and time. He has normal strength. No cranial nerve deficit or sensory deficit. Coordination normal. GCS eye subscore is 4. GCS verbal subscore is 5. GCS motor subscore is 6.  Skin: Skin is warm, dry and intact. No rash noted. No cyanosis.  Psychiatric: He has a normal mood and affect. His speech is normal and behavior is normal.  Thought content normal.    ED Course  Procedures (including critical care time) Labs Review Labs Reviewed  CBC WITH  DIFFERENTIAL - Abnormal; Notable for the following:    WBC 11.0 (*)    RBC 3.77 (*)    Hemoglobin 12.3 (*)    HCT 37.3 (*)    Platelets 103 (*)    Neutrophils Relative % 83 (*)    Neutro Abs 9.1 (*)    Lymphocytes Relative 6 (*)    Monocytes Absolute 1.2 (*)    All other components within normal limits  CG4 I-STAT (LACTIC ACID) - Abnormal; Notable for the following:    Lactic Acid, Venous 2.46 (*)    All other components within normal limits  CULTURE, BLOOD (ROUTINE X 2)  CULTURE, BLOOD (ROUTINE X 2)  URINE CULTURE  COMPREHENSIVE METABOLIC PANEL  PRO B NATRIURETIC PEPTIDE  TROPONIN I  URINALYSIS, ROUTINE W REFLEX MICROSCOPIC  INFLUENZA PANEL BY PCR   Imaging Review Dg Chest Port 1 View  06/14/2013   CLINICAL DATA:  Fever, weakness, body aches  EXAM: PORTABLE CHEST - 1 VIEW  COMPARISON:  None.  FINDINGS: Bilateral diffuse interstitial thickening and peribronchial cuffing most concerning for bronchitis. There is no pleural effusion or pneumothorax. Stable cardiomegaly. Prior CABG.  Degenerative changes of bilateral acromioclavicular joints.  IMPRESSION: Bilateral diffuse interstitial thickening and peribronchial cuffing most concerning for bronchitis.   Electronically Signed   By: Elige KoHetal  Patel   On: 06/14/2013 11:37    EKG Interpretation    Date/Time:  Saturday June 14 2013 10:51:44 EST Ventricular Rate:  111 PR Interval:    QRS Duration: 164 QT Interval:  438 QTC Calculation: 595 R Axis:   22 Text Interpretation:  Atrial flutter with predominant 2:1 AV block Right bundle branch block Baseline wander in lead(s) V4 No significant change since last tracing Confirmed by POLLINA  MD, CHRISTOPHER ([id number]) on 06/14/2013 11:13:20 AM            MDM  Diagnosis: Febrile illness, r/o Sepsis, UTI  Patient presents to the ER for evaluation of fever and  generalized weakness. Patient was recently hospitalized for pneumonia. He has had recurrent C. difficile colitis as well. Patient was seen several days ago in the ER for increasing weakness and workup was negative. He felt better after IV fluids and went home. He did well for several days, now started to have fever, generalized weakness and has had a cough. Sepsis was considered based on the patient's fever and intermittent tachycardia. He is not hypotensive. Patient's oxygen sats were borderline low (in low 90s) as well. With his history of cough, pneumonia was be considered. Influenza was also a consideration, based on community prevalence.  Patient's workup reveals minimal leukocytosis. Lactate is slightly elevated at 2.46. Patient has evidence of urinary infection on urinalysis, culture pending. Reviewing records, however, reveals that 3 days ago he had a urinalysis performed that was similar. The culture grew multiple species, no predominant pathogen was noted.  This is concerning for possible early sepsis. Empiric treatment however, has been withheld because the patient also has had recent C. difficile colitis. Unnecessary antibiotic treatment in this patient could lead to recurrent colitis. Additionally, recurrent colitis could be the cause of the patient's current fever and symptoms. Attempt C. difficile testing if stool sample available.  Patient will be hydrated. Influenza PCR has been sent. Will await results of influenza test. If positive, this would be the cause of the patient's febrile illness. If negative, then consider treatment for sepsis, including possible urinary tract infection source.    Gilda Creasehristopher J. Pollina, MD 06/14/13 1321

## 2013-06-14 NOTE — ED Notes (Signed)
Gave I stst CG4 critical results to MD Pollina

## 2013-06-14 NOTE — Progress Notes (Signed)
CRITICAL VALUE ALERT  Critical value received:  C. Diff positive  Date of notification:  06/14/13  Time of notification:  2012  Critical value read back:yes  Nurse who received alert:  Linward Natal, RN  MD notified (1st page):  Burnadette Peter  Time of first page:  2016  MD notified (2nd page):  Time of second page:  Responding MD:  Burnadette Peter  Time MD responded:  2018

## 2013-06-14 NOTE — ED Notes (Signed)
Dr. Blinda Leatherwood aware of temp and heart rate.

## 2013-06-14 NOTE — ED Notes (Signed)
Per pt was seen New Years Eve for weakness and diarrhea. Pt reports weakness has not improved and still having diarrhea at present time.Pt just recently finished antibodies for C dif. Pt denies pain, SOB, and n/v at present time.

## 2013-06-14 NOTE — H&P (Addendum)
[facility] History and Physical  David Davidson:[id number] DOB: July 02, 1910 DOA: 06/14/2013  Referring physician:  Jaci Carrel PCP:  [name], MD   Chief Complaint:  Fever, weakness  HPI:  The patient is a 80 y.o. year-old male with history of CAD s/p MI and CABG, chronic systolic heart failure with EF of 35%, severe aortic stenosis, paroxysmal atrial flutter, HTN, HLD, DM, CKD stage 3, recurrent C. Diff diarrhea who presents with fever and weakness.  The patient was last at their baseline health in early December. He was hospitalized from December second through the sixth with GI bleed at which time his warfarin was discontinued.  He was readmitted a few days later and hospitalized from December 9 2 the 12th with healthcare associated pneumonia and sepsis and recurrent C. difficile colitis. He was discharged home with a two-week course of vancomycin which he completed. He started having once daily soft liquidy stools.  He presented to the emergency department he presented to the emergency department on December 31 with progressive weakness and was given some IV fluids and sent home.  Chest x-ray was performed and is not having any respiratory symptoms. His urinalysis demonstrated large leukocyte esterase, too numerous to count white blood cells and many but no bacteria and urine culture grew 85,000 colonies of mixed flora.  He did well for the subsequent 2 days and at the request of the emergency department physician he did not take his Lasix.  He ate and felt well yesterday. This morning he woke up with chills. His son came to his house and gave him blankets inturned of the temperature, however the patient became so weak that he could barely lift his head from the bed. They brought him to the emergency department for further evaluation.  The patient denies any sinus congestion, rhinorrhea, sore throat, cough, shortness of breath, nausea, vomiting, abdominal pain. He has not had a  change in his diarrhea. He denies any change in his urinary habits or dysuria. He denies any skin ulcers or infections. He denies muscle aches, however he states he feels extremely fatigued and tired.  In the emergency department his temperature was 100.6 Fahrenheit, pulse in the low 100s, blood pressure 115/56, oxygen saturations 93% on room air. White blood cell count 11, hemoglobin 12.3, platelets 103. His BMP shows some improvement in the dehydration that he had a couple days ago. His troponin was negative. Chest x-ray demonstrated bronchitis and urinalysis was similar to that obtained several days ago.  He was given some gentle fluids and Tylenol in the emergency department. And influenza PCR is pending. He is being admitted for possible sepsis, currently meet SIRS criteria.  Review of Systems:  General:  Per history of present illness HEENT:  Denies changes to hearing and vision, rhinorrhea, sinus congestion, sore throat CV:  Denies chest pain and palpitations, lower extremity edema.  PULM:  Denies SOB, wheezing, cough.   GI:  Denies nausea, vomiting, constipation.  Chronic diarrhea.   GU:  Denies dysuria, frequency, urgency ENDO:  Denies polyuria, polydipsia.   HEME:  Denies hematemesis, blood in stools, melena, abnormal bruising or bleeding.  LYMPH:  Denies lymphadenopathy.   MSK:  Denies arthralgias, myalgias.   DERM:  Denies skin rash or ulcer.   NEURO:  Denies focal numbness, weakness, slurred speech, confusion, facial droop.  PSYCH:  Denies anxiety and depression.    Past Medical History  Diagnosis Date  . Myocardial infarct   . Diabetes mellitus   . Hypertension   .  CHF (congestive heart failure)   . Coronary artery disease   . Clostridium difficile diarrhea     recurrent   . Hyperlipidemia   . Pneumonia 04/2011  . Shortness of breath     only with my heart failure "  . Chronic kidney disease   . Dysrhythmia   . Paroxysmal atrial fibrillation   . DNR (do not  resuscitate)    Past Surgical History  Procedure Laterality Date  . Coronary artery bypass graft    . Cholecystectomy    . Cardiac catheterization  10/10/2001  . Cystoscopy, turbt  05/09/2006, 02/19/2006, 04/19/2005, 04/21/2003  . Colonoscopy  01/31/2012    Procedure: COLONOSCOPY;  Surgeon: Iva Boop, MD;  Location: [facility];  Service: Endoscopy;  Laterality: N/A;  fecal transplant/cynthia snyder will prepare the sample   Social History:  reports that he quit smoking about 39 years ago. His smoking use included Cigarettes. He smoked 1.00 pack per day. He has never used smokeless tobacco. He reports that he does not drink alcohol or use illicit drugs.   Allergies  Allergen Reactions  . Morphine And Related Other (See Comments)    Reaction unknown    Family History  Problem Relation Age of Onset  . Stomach cancer Mother   . Coronary artery disease Father   . Diabetes Son   . Colon cancer Neg Hx      Prior to Admission medications   Medication Sig Start Date End Date Taking? Authorizing Provider  tamsulosin (FLOMAX) 0.4 MG CAPS capsule Take 0.4 mg by mouth.   Yes Historical Provider, MD  zolpidem (AMBIEN) 10 MG tablet Take 5 mg by mouth at bedtime as needed for sleep.    Yes Historical Provider, MD  diphenhydramine-acetaminophen (TYLENOL PM) 25-500 MG TABS Take 1 tablet by mouth at bedtime as needed (sleep).    Historical Provider, MD  ezetimibe (ZETIA) 10 MG tablet Take 10 mg by mouth at bedtime.     Historical Provider, MD  finasteride (PROSCAR) 5 MG tablet Take 5 mg by mouth every morning.     Historical Provider, MD  fish oil-omega-3 fatty acids 1000 MG capsule Take 1 g by mouth every morning.     Historical Provider, MD  glyBURIDE (DIABETA) 5 MG tablet Take 2.5-5 mg by mouth daily as needed (high blood sugar).     Antonieta Pert, MD  levothyroxine (SYNTHROID, LEVOTHROID) 50 MCG tablet Take 50 mcg by mouth daily before breakfast.    Historical Provider, MD  LORazepam  (ATIVAN) 0.5 MG tablet Take 0.5 mg by mouth at bedtime as needed. For insomnia.    Historical Provider, MD  mirtazapine (REMERON) 15 MG tablet Take 15 mg by mouth at bedtime.    Historical Provider, MD  Multiple Vitamin (MULTIVITAMIN WITH MINERALS) TABS Take 1 tablet by mouth daily.    Historical Provider, MD  Multiple Vitamins-Minerals (ICAPS PO) Take 1 capsule by mouth daily.     Historical Provider, MD  potassium chloride (K-DUR,KLOR-CON) 10 MEQ tablet Take 10 mEq by mouth 3 (three) times daily.    Historical Provider, MD   Physical Exam: Filed Vitals:   06/14/13 1108 06/14/13 1119 06/14/13 1130 06/14/13 1332  BP:    115/56  Pulse: 106 107  108  Temp:    100.6 F (38.1 C)  TempSrc:    Oral  Resp: 13 22 23 16   SpO2: 97% 97%  97%     General:  Caucasian male, no acute distress  Eyes:  PERRL, anicteric, non-injected.  ENT:  Nares clear.  OP clear, non-erythematous without plaques or exudates.  MMM.  Neck:  Supple without TM or JVD.    Lymph:  No cervical, supraclavicular, or submandibular LAD.  Cardiovascular:  Tachycardic, IRRR, normal S1, S2, 4/6 systolic murmur at the right sternal border, no rubs or gallops.  2+ pulses, warm extremities  Respiratory:  CTA bilaterally without increased WOB.  Abdomen:  NABS.  Soft, ND/NT.    Skin:  No rashes or focal lesions.  Musculoskeletal:  Normal bulk and tone.  Trace bilateral LE edema.  Psychiatric:  A & O x 4.  Appropriate affect.  Neurologic:  CN 3-12 intact.  3/5 strength lower extremities, 4-5 upper extremities.  Sensation intact.  Labs on Admission:  Basic Metabolic Panel:  Recent Labs Lab 06/11/13 2220 06/14/13 1150  NA 138 139  K 4.6 4.7  CL 101 103  CO2 26 23  GLUCOSE 151* 170*  BUN 23 15  CREATININE 1.13 0.96  CALCIUM 8.9 9.0   Liver Function Tests:  Recent Labs Lab 06/11/13 2220 06/14/13 1150  AST 18 31  ALT 10 11  ALKPHOS 52 56  BILITOT 0.4 0.8  PROT 7.2 7.4  ALBUMIN 3.2* 3.3*   No results  found for this basename: LIPASE, AMYLASE,  in the last 168 hours No results found for this basename: AMMONIA,  in the last 168 hours CBC:  Recent Labs Lab 06/11/13 2220 06/14/13 1150  WBC 6.3 11.0*  NEUTROABS 3.9 9.1*  HGB 11.9* 12.3*  HCT 35.0* 37.3*  MCV 98.0 98.9  PLT 121* 103*   Cardiac Enzymes:  Recent Labs Lab 06/14/13 1150  TROPONINI <0.30    BNP (last 3 results)  Recent Labs  05/16/13 1403 05/20/13 0606 06/14/13 1150  PROBNP 2658.0* 1575.0* 879.1*   CBG: No results found for this basename: GLUCAP,  in the last 168 hours  Radiological Exams on Admission: Dg Chest Port 1 View  06/14/2013   CLINICAL DATA:  Fever, weakness, body aches  EXAM: PORTABLE CHEST - 1 VIEW  COMPARISON:  None.  FINDINGS: Bilateral diffuse interstitial thickening and peribronchial cuffing most concerning for bronchitis. There is no pleural effusion or pneumothorax. Stable cardiomegaly. Prior CABG.  Degenerative changes of bilateral acromioclavicular joints.  IMPRESSION: Bilateral diffuse interstitial thickening and peribronchial cuffing most concerning for bronchitis.   Electronically Signed   By: Elige KoHetal  Patel   On: 06/14/2013 11:37    EKG: Independently reviewed. A-fib (possibly a-flutter with 2:1) with RBBB, rate 111, unchanged from prior  Assessment/Plan Principal Problem:   SIRS (systemic inflammatory response syndrome) Active Problems:   CAD (coronary artery disease)   Paroxysmal a-fib   DM (diabetes mellitus), type 2, uncontrolled with complications   Hypertension   Hypothyroidism   Recurrent colitis due to Clostridium difficile   Anemia   Thrombocytopenia   Fever  ---  SIRS with fever, tachycardia, mildly elevated WBC, but unclear source.  May have UTI, but given bronchitic changes may also have flu and we have had a high incidence of flu recently.  Given his severe recurrent C. Diff and recent exacerbation a few weeks ago, would like to minimize antibiotics if possible.  DDx  also includes C. Diff as the cause of his recent symptoms.  No other obvious symptoms on exam.  Discussed SIRS and possible sepsis with family and the importance of early antibiotics in reducing mortality rates as well as the risk of C. Diff colitis.  Also discussed case with  Dr. Blinda Leatherwood.  As patient's blood pressure is stable and he currently has no focal symptoms/signs on exam, family and patient amenable to treating fever, giving IVF and restarting oral vancomycin prophylactically for now.  If his flu PCR becomes positive (result should be back within next few hours), he will be treated with tamiflu.  If flu PCR negative, will treat with ceftriaxone for possible sepsis due to UTI.  If he becomes unstable, will start empiric antibiotics.  He should continue vancomycin through his course of antibiotics and for several weeks beyond.  Wonder if he would be a candidate for repeat stool transplant but defer to ID.   -  F/u influenza PCR -  F/u repeat UCx -  Tylenol as needed -  Judicious IVF  CAD/HTN/HLD, blood pressure stable and chest pain free -  Patient not on ASA, BB, or statin for unclear reasons -  Continue zetia -  Hold fish oil for now (not taking therapeutic dose anyway)  Chronic systolic heart failure EF 35% -  Hold lasix and potassium -  Judicious IVF  PAF, tachycardic due to fever and possible flu or sepsis -  Judicious IVF -  Will not try to reduce rate for now as may be appropriate compensation for level of illness.  -  Telemetry -  Hold A/C due to last months GIB and current illness  BPH, stable.  Continue flomax  Hypothyroidism, stable.  Continue levothyroxine.  DM, with mild hyperglycemia -  Hold glyburide -  Low dose SSI  Insomnia, continue ambien prn and mirtazapine  Leukocytosis, likely due to infection.  Repeat CBC in AM  Chronic anemia and thrombocytopenia.  Repeat CBC in AM  CKD stage 3, stable.  Minimize nephrotoxins and renally dose medications.    Diet:   Dysphagia 3 Access:  PIV IVF:  yes Proph:  lovenox  Code Status: DNR Family Communication: patient and son Disposition Plan: Admit to telemetry  Time spent: 60 min Renae Fickle [facility] Pager [phone]  If 7PM-7AM, please contact night-coverage [email or web address] Password [email or web address] 06/14/2013, 2:28 PM

## 2013-06-14 NOTE — ED Notes (Signed)
Floor RN notified about vancomycin. Patient 500 bolus still running.

## 2013-06-14 NOTE — Progress Notes (Signed)
Flu test negative.  Patient tachycardic to the 140s and having rigors.  No new symptoms.  Spoke with Dr. Ilsa Iha from ID and discussed case.  Will start vanc and cefepime for now and de-escalate as soon as possible.  She will formally consult.  Consider adding flagyl if he decompensates.

## 2013-06-14 NOTE — Progress Notes (Signed)
BP was checked manually at 2147 and it was 82/ 52. 250cc bolus was given and BP was rechecked and it was 92/54. Will continue to monitor.

## 2013-06-14 NOTE — Progress Notes (Addendum)
ANTIBIOTIC CONSULT NOTE - INITIAL  Pharmacy Consult for vancomycin and cefepime Indication: SIRS, possible PNA and/or UTI  Allergies  Allergen Reactions  . Morphine And Related Other (See Comments)    Reaction unknown    Patient Measurements: Height: 6\' 1"  (185.4 cm) Weight: 189 lb 2.5 oz (85.8 kg) IBW/kg (Calculated) : 79.9 Adjusted Body Weight:   Vital Signs: Temp: 98.6 F (37 C) (01/03 1600) Temp src: Oral (01/03 1600) BP: 117/57 mmHg (01/03 1430) Pulse Rate: 105 (01/03 1430) Intake/Output from previous day:   Intake/Output from this shift:    Labs:  Recent Labs  06/11/13 2220 06/14/13 1150  WBC 6.3 11.0*  HGB 11.9* 12.3*  PLT 121* 103*  CREATININE 1.13 0.96   Estimated Creatinine Clearance: 46.2 ml/min (by C-G formula based on Cr of 0.96). No results found for this basename: VANCOTROUGH, VANCOPEAK, VANCORANDOM, GENTTROUGH, GENTPEAK, GENTRANDOM, TOBRATROUGH, TOBRAPEAK, TOBRARND, AMIKACINPEAK, AMIKACINTROU, AMIKACIN,  in the last 72 hours   Microbiology: Recent Results (from the past 720 hour(s))  CULTURE, BLOOD (ROUTINE X 2)     Status: None   Collection Time    05/20/13  7:12 AM      Result Value Range Status   Specimen Description BLOOD LEFT ANTECUBITAL   Final   Special Requests BOTTLES DRAWN AEROBIC AND ANAEROBIC 5ML   Final   Culture  Setup Time     Final   Value: 05/20/2013 11:42     Performed at [facility]   Culture     Final   Value: NO GROWTH 5 DAYS     Performed at [facility]   Report Status 05/26/2013 FINAL   Final  CULTURE, BLOOD (ROUTINE X 2)     Status: None   Collection Time    05/20/13  7:12 AM      Result Value Range Status   Specimen Description BLOOD RIGHT HAND   Final   Special Requests BOTTLES DRAWN AEROBIC AND ANAEROBIC 5ML   Final   Culture  Setup Time     Final   Value: 05/20/2013 11:42     Performed at [facility]   Culture     Final   Value: NO GROWTH 5 DAYS     Performed at [facility]   Report Status 05/26/2013 FINAL   Final  CLOSTRIDIUM DIFFICILE BY PCR     Status: Abnormal   Collection Time    05/21/13 12:31 AM      Result Value Range Status   C difficile by pcr POSITIVE (*) NEGATIVE Final   Comment: CRITICAL RESULT CALLED TO, READ BACK BY AND VERIFIED WITH:     REYNOLDS RN 9:25 05/21/13 ([email or web address])     Performed [facility]  URINE CULTURE     Status: None   Collection Time    06/11/13 10:47 PM      Result Value Range Status   Specimen Description URINE, CLEAN CATCH   Final   Special Requests NONE   Final   Culture  Setup Time     Final   Value: 06/12/2013 05:11     Performed at [facility]   Colony Count     Final   Value: 75,000 COLONIES/ML     Performed at [facility]   Culture     Final   Value: Multiple bacterial morphotypes present, none predominant. Suggest appropriate recollection if clinically indicated.     Performed at [facility]   Report  Status 06/13/2013 FINAL   Final    Medical History: Past Medical History  Diagnosis Date  . Myocardial infarct   . Diabetes mellitus   . Hypertension   . CHF (congestive heart failure)   . Coronary artery disease   . Clostridium difficile diarrhea     recurrent   . Hyperlipidemia   . Pneumonia 04/2011  . Shortness of breath     only with my heart failure "  . Chronic kidney disease   . Dysrhythmia   . Paroxysmal atrial fibrillation   . DNR (do not resuscitate)    Assessment: 89 YOM presents with fever and weakness. He has had several admission during the last month, he has had several bouts of c. Difficile diarrhea.  Oral vancomycin has been started out of concern for recurrence.  Ceftriaxone per pharmacy ordered for possible UTI  1/3 >>cefepime >> 1/3 >> vancomycin 1/3 >> vancomycin po  >>    Tmax: 101.7 WBCs: 11 Renal: SCr = 0.96 CrCl = 40ml/min (C-G) and 34ml/min (N)  1/3 blood: pending 1/3 urine: pending (UA consistent for UTI) 1/3  influenza PCR: negative  Goal of Therapy:  Vancomycin trough 15-36mcg/ml  Plan:   Vancomycin 1250mg  IV q24h (on conservative side due to age)  Follow renal function and check vancomycin trough at steady state  Cefepime 1gm IV q24h  Thanks,  Juliette Alcide, PharmD, BCPS.   Pager: [phone]  06/14/2013,4:34 PM

## 2013-06-14 NOTE — ED Notes (Signed)
Bed: KN39 Expected date: 06/14/13 Expected time: 10:39 AM Means of arrival: Ambulance Comments: 80 y/o weakness ? C-Diff

## 2013-06-15 DIAGNOSIS — R6520 Severe sepsis without septic shock: Secondary | ICD-10-CM

## 2013-06-15 DIAGNOSIS — R6521 Severe sepsis with septic shock: Secondary | ICD-10-CM

## 2013-06-15 DIAGNOSIS — A419 Sepsis, unspecified organism: Secondary | ICD-10-CM

## 2013-06-15 DIAGNOSIS — A0472 Enterocolitis due to Clostridium difficile, not specified as recurrent: Secondary | ICD-10-CM

## 2013-06-15 LAB — URINE CULTURE

## 2013-06-15 LAB — MRSA PCR SCREENING: MRSA BY PCR: POSITIVE — AB

## 2013-06-15 LAB — CBC
HCT: 33.8 % — ABNORMAL LOW (ref 39.0–52.0)
HEMATOCRIT: 34.6 % — AB (ref 39.0–52.0)
HEMOGLOBIN: 11.2 g/dL — AB (ref 13.0–17.0)
Hemoglobin: 11.4 g/dL — ABNORMAL LOW (ref 13.0–17.0)
MCH: 32.7 pg (ref 26.0–34.0)
MCH: 32.7 pg (ref 26.0–34.0)
MCHC: 32.9 g/dL (ref 30.0–36.0)
MCHC: 33.1 g/dL (ref 30.0–36.0)
MCV: 99.1 fL (ref 78.0–100.0)
MCV: 98.8 fL (ref 78.0–100.0)
PLATELETS: 104 10*3/uL — AB (ref 150–400)
PLATELETS: 106 10*3/uL — AB (ref 150–400)
RBC: 3.49 MIL/uL — AB (ref 4.22–5.81)
RBC: 3.42 MIL/uL — ABNORMAL LOW (ref 4.22–5.81)
RDW: 15.2 % (ref 11.5–15.5)
RDW: 15.3 % (ref 11.5–15.5)
WBC: 9.3 10*3/uL (ref 4.0–10.5)
WBC: 10 10*3/uL (ref 4.0–10.5)

## 2013-06-15 LAB — COMPREHENSIVE METABOLIC PANEL
ALT: 7 U/L (ref 0–53)
AST: 13 U/L (ref 0–37)
Albumin: 2.4 g/dL — ABNORMAL LOW (ref 3.5–5.2)
Alkaline Phosphatase: 43 U/L (ref 39–117)
BUN: 26 mg/dL — ABNORMAL HIGH (ref 6–23)
CALCIUM: 7.8 mg/dL — AB (ref 8.4–10.5)
CO2: 19 meq/L (ref 19–32)
CREATININE: 1.17 mg/dL (ref 0.50–1.35)
Chloride: 112 mEq/L (ref 96–112)
GFR, EST AFRICAN AMERICAN: 57 mL/min — AB (ref >90)
GFR, EST NON AFRICAN AMERICAN: 49 mL/min — AB (ref >90)
GLUCOSE: 162 mg/dL — AB (ref 70–99)
Potassium: 3.8 mEq/L (ref 3.7–5.3)
Sodium: 144 mEq/L (ref 137–147)
Total Bilirubin: 0.4 mg/dL (ref 0.3–1.2)
Total Protein: 6 g/dL (ref 6.0–8.3)

## 2013-06-15 LAB — LACTIC ACID, PLASMA
LACTIC ACID, VENOUS: 2.7 mmol/L — AB (ref 0.5–2.2)
Lactic Acid, Venous: 2.8 mmol/L — ABNORMAL HIGH (ref 0.5–2.2)

## 2013-06-15 LAB — BLOOD GAS, VENOUS
Acid-base deficit: 4.2 mmol/L — ABNORMAL HIGH (ref 0.0–2.0)
Bicarbonate: 18.2 mEq/L — ABNORMAL LOW (ref 20.0–24.0)
FIO2: 0.21 %
O2 Saturation: 94.8 %
PCO2 VEN: 27.1 mmHg — AB (ref 45.0–50.0)
PO2 VEN: 71.7 mmHg — AB (ref 30.0–45.0)
Patient temperature: 98.6
TCO2: 16.2 mmol/L (ref 0–100)
pH, Ven: 7.442 — ABNORMAL HIGH (ref 7.250–7.300)

## 2013-06-15 LAB — GLUCOSE, CAPILLARY
GLUCOSE-CAPILLARY: 167 mg/dL — AB (ref 70–99)
Glucose-Capillary: 202 mg/dL — ABNORMAL HIGH (ref 70–99)
Glucose-Capillary: 165 mg/dL — ABNORMAL HIGH (ref 70–99)

## 2013-06-15 LAB — BASIC METABOLIC PANEL
BUN: 23 mg/dL (ref 6–23)
CO2: 19 mEq/L (ref 19–32)
CREATININE: 1.21 mg/dL (ref 0.50–1.35)
Calcium: 8 mg/dL — ABNORMAL LOW (ref 8.4–10.5)
Chloride: 108 mEq/L (ref 96–112)
GFR calc non Af Amer: 47 mL/min — ABNORMAL LOW (ref >90)
GFR, EST AFRICAN AMERICAN: 55 mL/min — AB (ref >90)
Glucose, Bld: 256 mg/dL — ABNORMAL HIGH (ref 70–99)
Potassium: 3.7 mEq/L (ref 3.7–5.3)
Sodium: 141 mEq/L (ref 137–147)

## 2013-06-15 MED ORDER — SODIUM CHLORIDE 0.9 % IV BOLUS (SEPSIS): 500.0000 mL | Freq: Once | INTRAVENOUS | Status: DC

## 2013-06-15 MED ORDER — SODIUM CHLORIDE 0.9 % IV SOLN
INTRAVENOUS | Status: DC
  Administered 2013-06-15 – 2013-06-16 (×6): via INTRAVENOUS

## 2013-06-15 MED ORDER — SODIUM CHLORIDE 0.9 % IV BOLUS (SEPSIS)
500.0000 mL | Freq: Once | INTRAVENOUS | Status: AC
  Administered 2013-06-15: 06:00:00 500 mL via INTRAVENOUS

## 2013-06-15 MED ORDER — OXYCODONE HCL 5 MG PO TABS
5.0000 mg | ORAL_TABLET | Freq: Once | ORAL | Status: AC
  Administered 2013-06-15: 5 mg via ORAL
  Filled 2013-06-15: qty 1

## 2013-06-15 MED ORDER — FENTANYL CITRATE 0.05 MG/ML IJ SOLN
12.5000 ug | INTRAMUSCULAR | Status: DC | PRN
  Administered 2013-06-15 – 2013-06-17 (×7): 12.5 ug via INTRAVENOUS
  Filled 2013-06-15 (×7): qty 2

## 2013-06-15 MED ORDER — FENTANYL CITRATE 0.05 MG/ML IJ SOLN: 12.5000 ug | INTRAMUSCULAR | Status: DC | PRN

## 2013-06-15 MED ORDER — SODIUM CHLORIDE 0.9 % IV BOLUS (SEPSIS)
500.0000 mL | Freq: Once | INTRAVENOUS | Status: AC
  Administered 2013-06-15: 500 mL via INTRAVENOUS

## 2013-06-15 MED ORDER — LORAZEPAM 2 MG/ML IJ SOLN
1.0000 mg | INTRAMUSCULAR | Status: DC | PRN
  Administered 2013-06-15 – 2013-06-17 (×4): 1 mg via INTRAVENOUS
  Filled 2013-06-15 (×4): qty 1

## 2013-06-15 MED ORDER — SODIUM CHLORIDE 0.9 % IV BOLUS (SEPSIS)
1000.0000 mL | Freq: Once | INTRAVENOUS | Status: AC
  Administered 2013-06-15: 1000 mL via INTRAVENOUS

## 2013-06-15 NOTE — Progress Notes (Signed)
BP improving somewhat, patient feels terrible, but alert, smiling, and eating ice cream.  Had frank discussion about severity of illness.  Patient okay with repeat blood draw this afternoon and continuing antibiotics and IVF.  If possible, he would like to get better.  If his blood pressure drops or if he starts to feel worse, plan to d/c IVF and abx and give pain/anxiety medications for comfort "and let nature take its course."

## 2013-06-15 NOTE — Progress Notes (Signed)
[facility] PROGRESS NOTE  David Davidson UVO:[id number] DOB: 1910/07/02 DOA: 06/14/2013 PCP: [name], MD  Assessment/Plan  Sepsis, unknown source, fever and WBC trending down slightly.  Lactic acid still elevated, not making much urine, hypotensive.  Having diarrhea that is similar to what he has at home, question developing toxic megacolon? -  Spoke with family this morning that prognosis is likely poor -  Family to gather this morning -  Continue IVF and antibiotics for now -  Monitor for dyspnea given heart failure -  If improving, continue course -  If blood pressure still not improving this afternoon, family considering withdrawal of fluids/abx -  Flu PCR neg -  Would like to avoid transfer to stepdown if possible.  No CVC, pressors, intubation per family.   -  He appears comfortable, denies pain  CAD/HTN/HLD, blood pressure stable and chest pain free.  - Patient not on ASA, BB, or statin for unclear reasons  - hold zetia and fish oil for now  Chronic systolic heart failure EF 35%  - Hold lasix and potassium  - Judicious IVF   PAF, tachycardic due to fever and possible flu or sepsis  - Judicious IVF - Will not try to reduce rate for now as may be appropriate compensation for level of illness.  - Telemetry  - Hold A/C due to last months GIB and current illness   BPH, stable. Continue flomax   Hypothyroidism, stable. Continue levothyroxine.   DM, with mild hyperglycemia  - Hold glyburide  - Low dose SSI   Insomnia, continue ambien prn and mirtazapine   Leukocytosis, likely due to infection. Repeat CBC in AM  Chronic anemia and thrombocytopenia. Repeat CBC in AM  CKD stage 3, stable. Minimize nephrotoxins and renally dose medications.   BUN:Cr elevated this AM with mild metabolic acidosis, likely lactic acidosis and ketosis -  IVF  Diet: Dysphagia 3  Access: PIV  IVF: yes  Proph: lovenox   Code Status: DNR  Family Communication: patient, son,  daughter-in-law, extended family  Disposition Plan:  Continue telemetry  Consultants:  Infectious disease  Procedures:  CXR  Antibiotics:  Vancomycin IV 1/3 >>  Cefepime 1/3 >>  Vancomycin oral 1/3 >>  Flagyl 1/3 >>    HPI/Subjective:  Patient denies symptoms except for mild diarrhea  Objective: Filed Vitals:   06/15/13 0556 06/15/13 0649 06/15/13 0937 06/15/13 1247  BP: 78/42 80/47 79/48  93/49  Pulse: 103     Temp: 98.5 F (36.9 C)  97.3 F (36.3 C) 98.2 F (36.8 C)  TempSrc: Axillary  Oral Oral  Resp: 22  22 22   Height:      Weight:      SpO2: 95%  94% 97%    Intake/Output Summary (Last 24 hours) at 06/15/13 1404 Last data filed at 06/15/13 1100  Gross per 24 hour  Intake 481.67 ml  Output    301 ml  Net 180.67 ml   Filed Weights   06/14/13 1430  Weight: 85.8 kg (189 lb 2.5 oz)    Exam:   General:  CM, ill-appearing, No acute distress  HEENT:  NCAT, MMM  Cardiovascular:  RRR, nl S1, S2 no mrg, 2+ pulses, warm extremities  Respiratory:  CTAB, no increased WOB  Abdomen:   NABS, soft, mildly distended, NT  MSK:   Normal tone and bulk, no LEE  Neuro:  Grossly intact  Data Reviewed: Basic Metabolic Panel:  Recent Labs Lab 06/11/13 2220 06/14/13 1150 06/15/13 0505  NA  138 139 141  K 4.6 4.7 3.7  CL 101 103 108  CO2 26 23 19   GLUCOSE 151* 170* 256*  BUN 23 15 23   CREATININE 1.13 0.96 1.21  CALCIUM 8.9 9.0 8.0*   Liver Function Tests:  Recent Labs Lab 06/11/13 2220 06/14/13 1150  AST 18 31  ALT 10 11  ALKPHOS 52 56  BILITOT 0.4 0.8  PROT 7.2 7.4  ALBUMIN 3.2* 3.3*   No results found for this basename: LIPASE, AMYLASE,  in the last 168 hours No results found for this basename: AMMONIA,  in the last 168 hours CBC:  Recent Labs Lab 06/11/13 2220 06/14/13 1150 06/15/13 0505  WBC 6.3 11.0* 9.3  NEUTROABS 3.9 9.1*  --   HGB 11.9* 12.3* 11.4*  HCT 35.0* 37.3* 34.6*  MCV 98.0 98.9 99.1  PLT 121* 103* 104*   Cardiac  Enzymes:  Recent Labs Lab 06/14/13 1150  TROPONINI <0.30   BNP (last 3 results)  Recent Labs  05/16/13 1403 05/20/13 0606 06/14/13 1150  PROBNP 2658.0* 1575.0* 879.1*   CBG:  Recent Labs Lab 06/14/13 1657 06/14/13 2139 06/15/13 0803 06/15/13 1219  GLUCAP 208* 232* 202* 167*    Recent Results (from the past 240 hour(s))  URINE CULTURE     Status: None   Collection Time    06/11/13 10:47 PM      Result Value Range Status   Specimen Description URINE, CLEAN CATCH   Final   Special Requests NONE   Final   Culture  Setup Time     Final   Value: 06/12/2013 05:11     Performed at [facility] Count     Final   Value: 75,000 COLONIES/ML     Performed at [facility]   Culture     Final   Value: Multiple bacterial morphotypes present, none predominant. Suggest appropriate recollection if clinically indicated.     Performed at [facility]   Report Status 06/13/2013 FINAL   Final  CULTURE, BLOOD (ROUTINE X 2)     Status: None   Collection Time    06/14/13 10:35 AM      Result Value Range Status   Specimen Description BLOOD LEFT ARM   Final   Special Requests BOTTLES DRAWN AEROBIC AND ANAEROBIC 5CC EACH   Final   Culture  Setup Time     Final   Value: 06/14/2013 18:10     Performed at [facility]   Culture     Final   Value:        BLOOD CULTURE RECEIVED NO GROWTH TO DATE CULTURE WILL BE HELD FOR 5 DAYS BEFORE ISSUING A FINAL NEGATIVE REPORT     Performed at [facility]   Report Status PENDING   Incomplete  CULTURE, BLOOD (ROUTINE X 2)     Status: None   Collection Time    06/14/13 11:30 AM      Result Value Range Status   Specimen Description BLOOD LEFT HAND  5 ML IN [facility] BOTTLE   Final   Special Requests NONE   Final   Culture  Setup Time     Final   Value: 06/14/2013 18:10     Performed at [facility]   Culture     Final   Value:        BLOOD CULTURE RECEIVED NO GROWTH TO DATE CULTURE WILL BE HELD  FOR 5 DAYS BEFORE ISSUING A FINAL  NEGATIVE REPORT     Performed at [facility]   Report Status PENDING   Incomplete  CLOSTRIDIUM DIFFICILE BY PCR     Status: Abnormal   Collection Time    06/14/13  5:16 PM      Result Value Range Status   C difficile by pcr POSITIVE (*) NEGATIVE Final   Comment: CRITICAL RESULT CALLED TO, READ BACK BY AND VERIFIED WITH:     HUDSON,L RN 06/14/13 2012 WOOTEN,K     Performed at [facility]  MRSA PCR SCREENING     Status: Abnormal   Collection Time    06/15/13  6:57 AM      Result Value Range Status   MRSA by PCR POSITIVE (*) NEGATIVE Final   Comment:            The GeneXpert MRSA Assay (FDA     approved for NASAL specimens     only), is one component of a     comprehensive MRSA colonization     surveillance program. It is not     intended to diagnose MRSA     infection nor to guide or     monitor treatment for     MRSA infections.     RESULT CALLED TO, READ BACK BY AND VERIFIED WITH:     K CHEEK AT 0944 ON 01.04.2015 BY NBROOKS     Studies: Dg Chest Port 1 View  06/14/2013   CLINICAL DATA:  Fever, weakness, body aches  EXAM: PORTABLE CHEST - 1 VIEW  COMPARISON:  None.  FINDINGS: Bilateral diffuse interstitial thickening and peribronchial cuffing most concerning for bronchitis. There is no pleural effusion or pneumothorax. Stable cardiomegaly. Prior CABG.  Degenerative changes of bilateral acromioclavicular joints.  IMPRESSION: Bilateral diffuse interstitial thickening and peribronchial cuffing most concerning for bronchitis.   Electronically Signed   By: Elige Ko   On: 06/14/2013 11:37    Scheduled Meds: . ceFEPime (MAXIPIME) IV  1 g Intravenous Q24H  . finasteride  5 mg Oral BH-q7a  . insulin aspart  0-9 Units Subcutaneous TID WC  . metronidazole  500 mg Intravenous Q8H  . mirtazapine  15 mg Oral QHS  . sodium chloride  3 mL Intravenous Q12H  . tamsulosin  0.4 mg Oral QPC supper  . vancomycin  1,250 mg Intravenous Q24H  .  vancomycin  500 mg Oral Q6H   Continuous Infusions: . sodium chloride 125 mL/hr at 06/15/13 6213    Principal Problem:   SIRS (systemic inflammatory response syndrome) Active Problems:   CAD (coronary artery disease)   Paroxysmal a-fib   DM (diabetes mellitus), type 2, uncontrolled with complications   Hypertension   Hypothyroidism   Recurrent colitis due to Clostridium difficile   Anemia   Thrombocytopenia   Fever    Time spent: 30 min    Jaylianna Tatlock  [facility] Pager [phone]. If 7PM-7AM, please contact night-coverage at [email or web address], password [facility] 06/15/2013, 2:04 PM  LOS: 1 day

## 2013-06-15 NOTE — Progress Notes (Signed)
Pharmacist Heart Failure Core Measure Documentation  Assessment: David Davidson has an EF documented as 35-40% on 05/13/2013 by ECHO  Rationale: Heart failure patients with left ventricular systolic dysfunction (LVSD) and an EF < 40% should be prescribed an angiotensin converting enzyme inhibitor (ACEI) or angiotensin receptor blocker (ARB) at discharge unless a contraindication is documented in the medical record.  This patient is not currently on an ACEI or ARB for HF.  This note is being placed in the record in order to provide documentation that a contraindication to the use of these agents is present for this encounter.  ACE Inhibitor or Angiotensin Receptor Blocker is contraindicated (specify all that apply)  []   ACEI allergy AND ARB allergy []   Angioedema []   Moderate or severe aortic stenosis []   Hyperkalemia [x]   Hypotension (BP = 79/48) []   Renal artery stenosis []   Worsening renal function, preexisting renal disease or dysfunction   Dannielle Huh 06/15/2013 12:36 PM

## 2013-06-15 NOTE — Progress Notes (Signed)
Bladder scan performed and result is 32ml.  Nurse tech reports that she just had cleaned patient following a BM and possible incontinent urine. David Davidson

## 2013-06-15 NOTE — Progress Notes (Signed)
At 2100, Patient's BP was 80/52, oncall doctor was notified stated that if BP went into the 70's to call her back. RN checked BP at 2200 and BP was 85/55. Will continue to monitor the patient. Also, patient's family did not want RN to get blood sugar tonight since patient is now resting well.

## 2013-06-15 NOTE — Progress Notes (Signed)
Pt. Hasn't had any urine output for 8 hours. Md notified. Order In and out cath and increase fluids to 32ml/hr. In and out cath done 300 cc Output.

## 2013-06-15 NOTE — Progress Notes (Signed)
Patient's BP this morning was 78/42. Doctor notified and new orders were given for a 500cc bolus of fluid. Will recheck BP after bolus is done.

## 2013-06-15 NOTE — Progress Notes (Signed)
After 500cc bolus at 0600, BP still 80/ 47. Doctor notified and another 500 cc bolus ordered. Will report off to day nurse.

## 2013-06-15 NOTE — Progress Notes (Signed)
BP was check again at 2342 and still remain low at 84/53. MD notified and order 500cc bolus of NS. Rechecked BP was 90/60. Continue to monitor.

## 2013-06-16 ENCOUNTER — Inpatient Hospital Stay (HOSPITAL_COMMUNITY): Payer: Medicare Other

## 2013-06-16 DIAGNOSIS — I5043 Acute on chronic combined systolic (congestive) and diastolic (congestive) heart failure: Secondary | ICD-10-CM

## 2013-06-16 DIAGNOSIS — A0472 Enterocolitis due to Clostridium difficile, not specified as recurrent: Secondary | ICD-10-CM

## 2013-06-16 LAB — CBC WITH DIFFERENTIAL/PLATELET
Basophils Absolute: 0 10*3/uL (ref 0.0–0.1)
Basophils Relative: 0 % (ref 0–1)
Eosinophils Absolute: 0 10*3/uL (ref 0.0–0.7)
Eosinophils Relative: 0 % (ref 0–5)
HCT: 32.2 % — ABNORMAL LOW (ref 39.0–52.0)
Hemoglobin: 10.5 g/dL — ABNORMAL LOW (ref 13.0–17.0)
LYMPHS ABS: 1.2 10*3/uL (ref 0.7–4.0)
Lymphocytes Relative: 13 % (ref 12–46)
MCH: 32.6 pg (ref 26.0–34.0)
MCHC: 32.6 g/dL (ref 30.0–36.0)
MCV: 100 fL (ref 78.0–100.0)
MONO ABS: 1.4 10*3/uL — AB (ref 0.1–1.0)
Monocytes Relative: 15 % — ABNORMAL HIGH (ref 3–12)
Neutro Abs: 6.4 10*3/uL (ref 1.7–7.7)
Neutrophils Relative %: 72 % (ref 43–77)
PLATELETS: 81 10*3/uL — AB (ref 150–400)
RBC: 3.22 MIL/uL — ABNORMAL LOW (ref 4.22–5.81)
RDW: 15.3 % (ref 11.5–15.5)
WBC: 9 10*3/uL (ref 4.0–10.5)

## 2013-06-16 LAB — COMPREHENSIVE METABOLIC PANEL
ALK PHOS: 40 U/L (ref 39–117)
ALT: 6 U/L (ref 0–53)
AST: 10 U/L (ref 0–37)
Albumin: 2.3 g/dL — ABNORMAL LOW (ref 3.5–5.2)
BILIRUBIN TOTAL: 0.2 mg/dL — AB (ref 0.3–1.2)
BUN: 26 mg/dL — ABNORMAL HIGH (ref 6–23)
CO2: 20 mEq/L (ref 19–32)
Calcium: 7.5 mg/dL — ABNORMAL LOW (ref 8.4–10.5)
Chloride: 114 mEq/L — ABNORMAL HIGH (ref 96–112)
Creatinine, Ser: 1.07 mg/dL (ref 0.50–1.35)
GFR calc Af Amer: 64 mL/min — ABNORMAL LOW (ref >90)
GFR calc non Af Amer: 55 mL/min — ABNORMAL LOW (ref >90)
Glucose, Bld: 154 mg/dL — ABNORMAL HIGH (ref 70–99)
POTASSIUM: 3.6 meq/L — AB (ref 3.7–5.3)
SODIUM: 146 meq/L (ref 137–147)
Total Protein: 5.7 g/dL — ABNORMAL LOW (ref 6.0–8.3)

## 2013-06-16 LAB — GLUCOSE, CAPILLARY
GLUCOSE-CAPILLARY: 137 mg/dL — AB (ref 70–99)
GLUCOSE-CAPILLARY: 197 mg/dL — AB (ref 70–99)
Glucose-Capillary: 171 mg/dL — ABNORMAL HIGH (ref 70–99)

## 2013-06-16 LAB — LACTIC ACID, PLASMA: Lactic Acid, Venous: 1.8 mmol/L (ref 0.5–2.2)

## 2013-06-16 MED ORDER — POTASSIUM CHLORIDE CRYS ER 20 MEQ PO TBCR
40.0000 meq | EXTENDED_RELEASE_TABLET | Freq: Once | ORAL | Status: AC
  Administered 2013-06-16: 40 meq via ORAL
  Filled 2013-06-16 (×2): qty 2

## 2013-06-16 MED ORDER — GLUCERNA SHAKE PO LIQD
237.0000 mL | ORAL | Status: DC
  Administered 2013-06-16 – 2013-06-17 (×2): 237 mL via ORAL
  Filled 2013-06-16 (×4): qty 237

## 2013-06-16 NOTE — Progress Notes (Signed)
[facility]  Date of Admission:  06/14/2013  Antibiotics: Antibiotics Given (last 72 hours)   Date/Time Action Medication Dose Rate   06/14/13 1705 Given   vancomycin (VANCOCIN) 50 mg/mL oral solution 125 mg 125 mg    06/14/13 1733 Given   ceFEPIme (MAXIPIME) 1 g in dextrose 5 % 50 mL IVPB 1 g 100 mL/hr   06/14/13 2016 Given   vancomycin (VANCOCIN) 1,250 mg in sodium chloride 0.9 % 250 mL IVPB 1,250 mg 166.7 mL/hr   06/14/13 2256 Given   metroNIDAZOLE (FLAGYL) IVPB 500 mg 500 mg 100 mL/hr   06/14/13 2302 Given   vancomycin (VANCOCIN) 50 mg/mL oral solution 500 mg 500 mg    06/15/13 0503 Given   metroNIDAZOLE (FLAGYL) IVPB 500 mg 500 mg 100 mL/hr   06/15/13 0504 Given   vancomycin (VANCOCIN) 50 mg/mL oral solution 500 mg 500 mg    06/15/13 1154 Given   vancomycin (VANCOCIN) 50 mg/mL oral solution 500 mg 500 mg    06/15/13 1445 Given   metroNIDAZOLE (FLAGYL) IVPB 500 mg 500 mg 100 mL/hr   06/15/13 1705 Given   ceFEPIme (MAXIPIME) 1 g in dextrose 5 % 50 mL IVPB 1 g 100 mL/hr   06/15/13 1906 Given   vancomycin (VANCOCIN) 50 mg/mL oral solution 500 mg 500 mg    06/15/13 2052 Given   vancomycin (VANCOCIN) 1,250 mg in sodium chloride 0.9 % 250 mL IVPB 1,250 mg 166.7 mL/hr   06/15/13 2306 Given   metroNIDAZOLE (FLAGYL) IVPB 500 mg 500 mg 100 mL/hr   06/16/13 0015 Given   vancomycin (VANCOCIN) 50 mg/mL oral solution 500 mg 500 mg    06/16/13 0534 Given   metroNIDAZOLE (FLAGYL) IVPB 500 mg 500 mg 100 mL/hr   06/16/13 0706 Given   vancomycin (VANCOCIN) 50 mg/mL oral solution 500 mg 500 mg       Subjective: Feels better, less diarrhea  Objective: Temp:  [97.6 F (36.4 C)-98.4 F (36.9 C)] 98.3 F (36.8 C) (01/05 0443) Pulse Rate:  [106-108] 106 (01/05 0443) Resp:  [20-24] 24 (01/05 0443) BP: (80-101)/(45-70) 101/70 mmHg (01/05 0443) SpO2:  [95 %-97 %] 96 % (01/05 0443) Weight:  [195 lb 15.8 oz (88.9 kg)-198 lb 3.2 oz (89.903 kg)] 198 lb 3.2 oz  (89.903 kg) (01/05 0443)  General: Awake, alert, oriented x 3 Skin: no rashes Lungs: CTA B Cor:  RRR without m Abdomen: soft, nt, nd, +normoactive bs Ext: no edema  Lab Results Lab Results  Component Value Date   WBC 9.0 06/16/2013   HGB 10.5* 06/16/2013   HCT 32.2* 06/16/2013   MCV 100.0 06/16/2013   PLT 81* 06/16/2013    Lab Results  Component Value Date   CREATININE 1.07 06/16/2013   BUN 26* 06/16/2013   NA 146 06/16/2013   K 3.6* 06/16/2013   CL 114* 06/16/2013   CO2 20 06/16/2013    Lab Results  Component Value Date   ALT 6 06/16/2013   AST 10 06/16/2013   ALKPHOS 40 06/16/2013   BILITOT 0.2* 06/16/2013      Microbiology: Recent Results (from the past 240 hour(s))  URINE CULTURE     Status: None   Collection Time    06/11/13 10:47 PM      Result Value Range Status   Specimen Description URINE, CLEAN CATCH   Final   Special Requests NONE   Final   Culture  Setup Time     Final  Value: 06/12/2013 05:11     Performed at [facility] Count     Final   Value: 75,000 COLONIES/ML     Performed at [facility]   Culture     Final   Value: Multiple bacterial morphotypes present, none predominant. Suggest appropriate recollection if clinically indicated.     Performed at [facility]   Report Status 06/13/2013 FINAL   Final  CULTURE, BLOOD (ROUTINE X 2)     Status: None   Collection Time    06/14/13 10:35 AM      Result Value Range Status   Specimen Description BLOOD LEFT ARM   Final   Special Requests BOTTLES DRAWN AEROBIC AND ANAEROBIC 5CC EACH   Final   Culture  Setup Time     Final   Value: 06/14/2013 18:10     Performed at [facility]   Culture     Final   Value:        BLOOD CULTURE RECEIVED NO GROWTH TO DATE CULTURE WILL BE HELD FOR 5 DAYS BEFORE ISSUING A FINAL NEGATIVE REPORT     Performed at [facility]   Report Status PENDING   Incomplete  CULTURE, BLOOD (ROUTINE X 2)     Status: None   Collection Time    06/14/13 11:30  AM      Result Value Range Status   Specimen Description BLOOD LEFT HAND  5 ML IN [facility] BOTTLE   Final   Special Requests NONE   Final   Culture  Setup Time     Final   Value: 06/14/2013 18:10     Performed at [facility]   Culture     Final   Value:        BLOOD CULTURE RECEIVED NO GROWTH TO DATE CULTURE WILL BE HELD FOR 5 DAYS BEFORE ISSUING A FINAL NEGATIVE REPORT     Performed at [facility]   Report Status PENDING   Incomplete  URINE CULTURE     Status: None   Collection Time    06/14/13 11:54 AM      Result Value Range Status   Specimen Description URINE, CLEAN CATCH   Final   Special Requests NONE   Final   Culture  Setup Time     Final   Value: 06/14/2013 20:04     Performed at [facility] Count     Final   Value: 40,000 COLONIES/ML     Performed at [facility]   Culture     Final   Value: Multiple bacterial morphotypes present, none predominant. Suggest appropriate recollection if clinically indicated.     Performed at [facility]   Report Status 06/15/2013 FINAL   Final  CLOSTRIDIUM DIFFICILE BY PCR     Status: Abnormal   Collection Time    06/14/13  5:16 PM      Result Value Range Status   C difficile by pcr POSITIVE (*) NEGATIVE Final   Comment: CRITICAL RESULT CALLED TO, READ BACK BY AND VERIFIED WITH:     HUDSON,L RN 06/14/13 2012 WOOTEN,K     Performed at [facility]  MRSA PCR SCREENING     Status: Abnormal   Collection Time    06/15/13  6:57 AM      Result Value Range Status   MRSA by PCR POSITIVE (*) NEGATIVE Final   Comment:  The GeneXpert MRSA Assay (FDA     approved for NASAL specimens     only), is one component of a     comprehensive MRSA colonization     surveillance program. It is not     intended to diagnose MRSA     infection nor to guide or     monitor treatment for     MRSA infections.     RESULT CALLED TO, READ BACK BY AND VERIFIED WITH:     K CHEEK AT 0944 ON  01.04.2015 BY NBROOKS    Studies/Results: Dg Chest Port 1 View  06/16/2013   CLINICAL DATA:  History sepsis.  Abdominal pain.  Shortness breath.  EXAM: PORTABLE CHEST - 1 VIEW  COMPARISON:  06/14/2013 and 05/20/2013.  FINDINGS: Post CABG.  Cardiomegaly.  Pulmonary vascular congestion/mild pulmonary edema.  Elevated left hemidiaphragm. Left base atelectasis. Limited for excluding left base infiltrate.  Calcified tortuous aorta.  No gross pneumothorax.  IMPRESSION: Post CABG.  Cardiomegaly.  Pulmonary vascular congestion/mild pulmonary edema.  Elevated left hemidiaphragm. Left base atelectasis. Limited for excluding left base infiltrate.  Calcified tortuous aorta.   Electronically Signed   By: Bridgett LarssonSteve  Olson M.D.   On: 06/16/2013 11:08   Dg Chest Port 1 View  06/14/2013   CLINICAL DATA:  Fever, weakness, body aches  EXAM: PORTABLE CHEST - 1 VIEW  COMPARISON:  None.  FINDINGS: Bilateral diffuse interstitial thickening and peribronchial cuffing most concerning for bronchitis. There is no pleural effusion or pneumothorax. Stable cardiomegaly. Prior CABG.  Degenerative changes of bilateral acromioclavicular joints.  IMPRESSION: Bilateral diffuse interstitial thickening and peribronchial cuffing most concerning for bronchitis.   Electronically Signed   By: Elige KoHetal  Patel   On: 06/14/2013 11:37    Assessment/Plan: 1) C diff infection - seems to be responding well to oral vancomycin and IV flagyl.  Can continue with IV flagyl while in house and d/c at discharge.   -he will need a prolonged course of po vancomycin (4 weeks) as long as he continues to do well -will continue to follow  Staci RighterOMER, Jone Panebianco, MD [facility] [email or web address] [phone] pager   [phone] cell 06/16/2013, 11:13 AM

## 2013-06-16 NOTE — Progress Notes (Signed)
INITIAL NUTRITION ASSESSMENT  DOCUMENTATION CODES Per approved criteria  -Not Applicable   INTERVENTION: Recommend to add Glucerna shake once daily  NUTRITION DIAGNOSIS: Inadequate oral intake related to decreased appetite as evidenced by wt loss, poor PO for one week.   Goal: Pt to meet >/= 90% of their estimated nutrition needs   Monitor:  Total protein/energy intake, GI profile, labs, weights  Reason for Assessment: Malnutrition Screening Tool  [age] y.o. male  Admitting Dx: Septic shock  ASSESSMENT: The patient is a [age] y.o. year-old male with history of CAD s/p MI and CABG, chronic systolic heart failure with EF of 35%, severe aortic stenosis, paroxysmal atrial flutter, HTN, HLD, DM, CKD stage 3, recurrent C. Diff diarrhea who presents with fever and weakness. The patient was last at their baseline health in early December. He was hospitalized from December second through the sixth with GI bleed at which time his warfarin was discontinued. He was readmitted a few days later and hospitalized from December 9 2 the 12th with healthcare associated pneumonia and sepsis and recurrent C. difficile colitis. SIRS with fever, tachycardia, mildly elevated WBC, but unclear source  -Pt reported decreased appetite for one week prior to admission, likely r/t to C.diff and loose stools -Noted to have experienced some weight loss, was unable quantify amount -Drinks Glucerna shake one daily. Will recommend to continue with supplement regimen. Current PO intake is approximately 50% -Denied any n/v or difficulty tolerating diet texture -With hx of CKD. Renal profile within normal limits, no need for renal diet at this time  Height: Ht Readings from Last 1 Encounters:  06/14/13 6\' 1"  (1.854 m)    Weight: Wt Readings from Last 1 Encounters:  06/16/13 198 lb 3.2 oz (89.903 kg)    Ideal Body Weight: 184 lbs  % Ideal Body Weight: 108%  Wt Readings from Last 10 Encounters:  06/16/13 198 lb  3.2 oz (89.903 kg)  05/23/13 193 lb 9 oz (87.8 kg)  05/17/13 212 lb 8.4 oz (96.4 kg)  03/18/13 210 lb (95.255 kg)  12/26/12 196 lb 8 oz (89.132 kg)  08/23/12 195 lb 12.3 oz (88.8 kg)  01/19/12 202 lb (91.627 kg)  12/20/11 198 lb 12.8 oz (90.175 kg)  12/12/11 196 lb 12 oz (89.245 kg)  09/28/11 177 lb (80.287 kg)    Usual Body Weight: unable to determine  % Usual Body Weight: unable to determine  BMI:  Body mass index is 26.16 kg/(m^2).  Estimated Nutritional Needs: Kcal: 1700-1900  Protein: 90-100 gram Fluid: 2000 ml/daily  Skin: Two dime sized pressure sores on each buttock  Diet Order: Dysphagia 3  EDUCATION NEEDS: -No education needs identified at this time   Intake/Output Summary (Last 24 hours) at 06/16/13 1212 Last data filed at 06/16/13 0900  Gross per 24 hour  Intake 3362.92 ml  Output    600 ml  Net 2762.92 ml    Last BM: 1/05  Labs:   Recent Labs Lab 06/15/13 0505 06/15/13 1619 06/16/13 0705  NA 141 144 146  K 3.7 3.8 3.6*  CL 108 112 114*  CO2 19 19 20   BUN 23 26* 26*  CREATININE 1.21 1.17 1.07  CALCIUM 8.0* 7.8* 7.5*  GLUCOSE 256* 162* 154*    CBG (last 3)   Recent Labs  06/15/13 1634 06/16/13 0805 06/16/13 1205  GLUCAP 165* 137* 197*    Scheduled Meds: . finasteride  5 mg Oral BH-q7a  . insulin aspart  0-9 Units Subcutaneous TID WC  .  metronidazole  500 mg Intravenous Q8H  . mirtazapine  15 mg Oral QHS  . sodium chloride  3 mL Intravenous Q12H  . tamsulosin  0.4 mg Oral QPC supper  . vancomycin  500 mg Oral Q6H    Continuous Infusions: . sodium chloride 125 mL/hr at 06/16/13 0534    Past Medical History  Diagnosis Date  . Myocardial infarct   . Diabetes mellitus   . Hypertension   . CHF (congestive heart failure)   . Coronary artery disease   . Clostridium difficile diarrhea     recurrent   . Hyperlipidemia   . Pneumonia 04/2011  . Shortness of breath     only with my heart failure "  . Chronic kidney disease    . Dysrhythmia   . Paroxysmal atrial fibrillation   . DNR (do not resuscitate)     Past Surgical History  Procedure Laterality Date  . Coronary artery bypass graft    . Cholecystectomy    . Cardiac catheterization  10/10/2001  . Cystoscopy, turbt  05/09/2006, 02/19/2006, 04/19/2005, 04/21/2003  . Colonoscopy  01/31/2012    Procedure: COLONOSCOPY;  Surgeon: Iva Boop, MD;  Location: [facility];  Service: Endoscopy;  Laterality: N/A;  fecal transplant/cynthia snyder will prepare the sample    Lloyd Huger MS RD LDN Clinical Dietitian [phone]

## 2013-06-16 NOTE — Progress Notes (Addendum)
[facility] PROGRESS NOTE  David Davidson YQM:[id number] DOB: [birth year] DOA: 06/14/2013 PCP: [name], MD  Assessment/Plan  Septic shock due to C. Diff colitis.  Fever and WBC trending down.  Lactic acid trending down and uop improving.  BP improved this morning. -  Repeat CXR and KUB:  No evidence of pneumonia or toxic megacolon -  UCx grew few colonies of mixed flora -  BCx NGTD -  D/c IV vanc and cefeime -  Continue IV flagyl and oral vanc  Pulmonary edema due to fluid resuscitation likely due to acute on chronic systolic heart failure -  D/c IVF -  Carefully monitor BP -  Nasal canula as needed to maintain O2 saturations > 92% -  No lasix due to recent severe and current borderline hypotension  CAD/HTN/HLD, chest pain free.  - Patient not on ASA, BB, or statin for unclear reasons  - hold zetia and fish oil for now  Chronic systolic heart failure EF 35%  - Hold lasix and potassium  - Judicious IVF   PAF, with persistent tachycardia, likely compensatory - Will not try to reduce rate for now as may be appropriate compensation for level of illness.  - Continue telemetry  - Hold A/C due to last months GIB and current illness   BPH, stable. Continue flomax   Hypothyroidism, stable. Continue levothyroxine.   DM, with mild hyperglycemia  - Hold glyburide  - Low dose SSI   Insomnia, continue ambien prn and mirtazapine   Leukocytosis, resolved with abx.  Chronic anemia and thrombocytopenia.  Hemoglobin and platelets drifting down, possibly due to mild DIC or marrow suppression or consumption by inflammation -  Repeat in AM -  Check fibrinogen level in AM and smear CKD stage 3, stable. Minimize nephrotoxins and renally dose medications.   Creatinine trending down and metabolic acidosis resolving  Diet: Dysphagia 3  Access: PIV  IVF: yes  Proph: lovenox   Code Status: DNR  Family Communication: patient, son, daughter-in-law, extended family  Disposition  Plan:  Continue telemetry.  Start to mobilize as soon as tolerated.  Will order PT/OT for tomorrow  Consultants:  Infectious disease  Procedures:  CXR  Antibiotics:  Vancomycin IV 1/3 >> 1/5  Cefepime 1/3 >> 1/5  Vancomycin oral 1/3 >>  Flagyl 1/3 >>    HPI/Subjective:  Diarrhea slowing.  Feeling a little better today.  Denies SOB, cough, dysuria, or focal symptoms  Objective: Filed Vitals:   06/15/13 2056 06/15/13 2200 06/16/13 0021 06/16/13 0443  BP: 80/52 85/55 91/61  101/70  Pulse: 107  106 106  Temp: 98.3 F (36.8 C)  97.6 F (36.4 C) 98.3 F (36.8 C)  TempSrc: Oral  Oral Oral  Resp: 22  24 24   Height:      Weight:    89.903 kg (198 lb 3.2 oz)  SpO2: 95%  97% 96%    Intake/Output Summary (Last 24 hours) at 06/16/13 1422 Last data filed at 06/16/13 0900  Gross per 24 hour  Intake 3362.92 ml  Output    600 ml  Net 2762.92 ml   Filed Weights   06/14/13 1430 06/15/13 1756 06/16/13 0443  Weight: 85.8 kg (189 lb 2.5 oz) 88.9 kg (195 lb 15.8 oz) 89.903 kg (198 lb 3.2 oz)    Exam:   General:  CM, No acute distress, appears better today  HEENT:  NCAT, MMM  Cardiovascular:  tachycardic IRRR, nl S1, S2 no mrg, 2+ pulses, warm extremities  Respiratory:  Rales  at the bilateral bases without wheeze or rhonchi, no increased WOB  Abdomen:   NABS, soft, mildly distended, NT  MSK:   Normal tone and bulk, trace LEE  Neuro:  Grossly intact  Data Reviewed: Basic Metabolic Panel:  Recent Labs Lab 06/11/13 2220 06/14/13 1150 06/15/13 0505 06/15/13 1619 06/16/13 0705  NA 138 139 141 144 146  K 4.6 4.7 3.7 3.8 3.6*  CL 101 103 108 112 114*  CO2 26 23 19 19 20   GLUCOSE 151* 170* 256* 162* 154*  BUN 23 15 23  26* 26*  CREATININE 1.13 0.96 1.21 1.17 1.07  CALCIUM 8.9 9.0 8.0* 7.8* 7.5*   Liver Function Tests:  Recent Labs Lab 06/11/13 2220 06/14/13 1150 06/15/13 1619 06/16/13 0705  AST 18 31 13 10   ALT 10 11 7 6   ALKPHOS 52 56 43 40  BILITOT  0.4 0.8 0.4 0.2*  PROT 7.2 7.4 6.0 5.7*  ALBUMIN 3.2* 3.3* 2.4* 2.3*   No results found for this basename: LIPASE, AMYLASE,  in the last 168 hours No results found for this basename: AMMONIA,  in the last 168 hours CBC:  Recent Labs Lab 06/11/13 2220 06/14/13 1150 06/15/13 0505 06/15/13 1619 06/16/13 0705  WBC 6.3 11.0* 9.3 10.0 9.0  NEUTROABS 3.9 9.1*  --   --  6.4  HGB 11.9* 12.3* 11.4* 11.2* 10.5*  HCT 35.0* 37.3* 34.6* 33.8* 32.2*  MCV 98.0 98.9 99.1 98.8 100.0  PLT 121* 103* 104* 106* 81*   Cardiac Enzymes:  Recent Labs Lab 06/14/13 1150  TROPONINI <0.30   BNP (last 3 results)  Recent Labs  05/16/13 1403 05/20/13 0606 06/14/13 1150  PROBNP 2658.0* 1575.0* 879.1*   CBG:  Recent Labs Lab 06/15/13 0803 06/15/13 1219 06/15/13 1634 06/16/13 0805 06/16/13 1205  GLUCAP 202* 167* 165* 137* 197*    Recent Results (from the past 240 hour(s))  URINE CULTURE     Status: None   Collection Time    06/11/13 10:47 PM      Result Value Range Status   Specimen Description URINE, CLEAN CATCH   Final   Special Requests NONE   Final   Culture  Setup Time     Final   Value: 06/12/2013 05:11     Performed at [facility] Count     Final   Value: 75,000 COLONIES/ML     Performed at [facility]   Culture     Final   Value: Multiple bacterial morphotypes present, none predominant. Suggest appropriate recollection if clinically indicated.     Performed at [facility]   Report Status 06/13/2013 FINAL   Final  CULTURE, BLOOD (ROUTINE X 2)     Status: None   Collection Time    06/14/13 10:35 AM      Result Value Range Status   Specimen Description BLOOD LEFT ARM   Final   Special Requests BOTTLES DRAWN AEROBIC AND ANAEROBIC 5CC EACH   Final   Culture  Setup Time     Final   Value: 06/14/2013 18:10     Performed at [facility]   Culture     Final   Value:        BLOOD CULTURE RECEIVED NO GROWTH TO DATE CULTURE WILL BE HELD  FOR 5 DAYS BEFORE ISSUING A FINAL NEGATIVE REPORT     Performed at [facility]   Report Status PENDING   Incomplete  CULTURE, BLOOD (ROUTINE X 2)  Status: None   Collection Time    06/14/13 11:30 AM      Result Value Range Status   Specimen Description BLOOD LEFT HAND  5 ML IN [facility] BOTTLE   Final   Special Requests NONE   Final   Culture  Setup Time     Final   Value: 06/14/2013 18:10     Performed at [facility]   Culture     Final   Value:        BLOOD CULTURE RECEIVED NO GROWTH TO DATE CULTURE WILL BE HELD FOR 5 DAYS BEFORE ISSUING A FINAL NEGATIVE REPORT     Performed at [facility]   Report Status PENDING   Incomplete  URINE CULTURE     Status: None   Collection Time    06/14/13 11:54 AM      Result Value Range Status   Specimen Description URINE, CLEAN CATCH   Final   Special Requests NONE   Final   Culture  Setup Time     Final   Value: 06/14/2013 20:04     Performed at [facility] Count     Final   Value: 40,000 COLONIES/ML     Performed at [facility]   Culture     Final   Value: Multiple bacterial morphotypes present, none predominant. Suggest appropriate recollection if clinically indicated.     Performed at [facility]   Report Status 06/15/2013 FINAL   Final  CLOSTRIDIUM DIFFICILE BY PCR     Status: Abnormal   Collection Time    06/14/13  5:16 PM      Result Value Range Status   C difficile by pcr POSITIVE (*) NEGATIVE Final   Comment: CRITICAL RESULT CALLED TO, READ BACK BY AND VERIFIED WITH:     HUDSON,L RN 06/14/13 2012 WOOTEN,K     Performed at [facility]  MRSA PCR SCREENING     Status: Abnormal   Collection Time    06/15/13  6:57 AM      Result Value Range Status   MRSA by PCR POSITIVE (*) NEGATIVE Final   Comment:            The GeneXpert MRSA Assay (FDA     approved for NASAL specimens     only), is one component of a     comprehensive MRSA colonization     surveillance  program. It is not     intended to diagnose MRSA     infection nor to guide or     monitor treatment for     MRSA infections.     RESULT CALLED TO, READ BACK BY AND VERIFIED WITH:     K CHEEK AT 0944 ON 01.04.2015 BY NBROOKS     Studies: Dg Chest [facility] 1 View  06/16/2013   CLINICAL DATA:  History sepsis.  Abdominal pain.  Shortness breath.  EXAM: PORTABLE CHEST - 1 VIEW  COMPARISON:  06/14/2013 and 05/20/2013.  FINDINGS: Post CABG.  Cardiomegaly.  Pulmonary vascular congestion/mild pulmonary edema.  Elevated left hemidiaphragm. Left base atelectasis. Limited for excluding left base infiltrate.  Calcified tortuous aorta.  No gross pneumothorax.  IMPRESSION: Post CABG.  Cardiomegaly.  Pulmonary vascular congestion/mild pulmonary edema.  Elevated left hemidiaphragm. Left base atelectasis. Limited for excluding left base infiltrate.  Calcified tortuous aorta.   Electronically Signed   By: Bridgett Larsson M.D.   On: 06/16/2013 11:08   Dg Abd  Portable 1v  06/16/2013   CLINICAL DATA:  Abdominal pain.  EXAM: PORTABLE ABDOMEN - 1 VIEW  COMPARISON:  08/16/2011.  FINDINGS: Ileus appearing bowel gas pattern without findings for obstruction or perforation. The soft tissue shadows are maintained. The bony structures are intact.  IMPRESSION: Probable mild diffuse ileus.   Electronically Signed   By: Loralie Champagne M.D.   On: 06/16/2013 11:33    Scheduled Meds: . feeding supplement (GLUCERNA SHAKE)  237 mL Oral Q24H  . finasteride  5 mg Oral [id number]  . insulin aspart  0-9 Units Subcutaneous TID WC  . metronidazole  500 mg Intravenous Q8H  . mirtazapine  15 mg Oral QHS  . potassium chloride  40 mEq Oral Once  . sodium chloride  3 mL Intravenous Q12H  . tamsulosin  0.4 mg Oral QPC supper  . vancomycin  500 mg Oral Q6H   Continuous Infusions: . sodium chloride 20 mL/hr (06/16/13 1306)    Principal Problem:   Septic shock Active Problems:   CAD (coronary artery disease)   Paroxysmal a-fib   DM (diabetes  mellitus), type 2, uncontrolled with complications   Hypertension   Hypothyroidism   Recurrent colitis due to Clostridium difficile   Anemia   Thrombocytopenia   Fever   C. difficile diarrhea    Time spent: 30 min    Kera Deacon, [facility]  [facility] Pager [phone]. If 7PM-7AM, please contact night-coverage at [email or web address], password [email or web address] 06/16/2013, 2:22 PM  LOS: 2 days

## 2013-06-17 ENCOUNTER — Inpatient Hospital Stay: Payer: Medicare Other | Admitting: Infectious Disease

## 2013-06-17 LAB — BASIC METABOLIC PANEL
BUN: 24 mg/dL — ABNORMAL HIGH (ref 6–23)
CO2: 20 mEq/L (ref 19–32)
Calcium: 7.7 mg/dL — ABNORMAL LOW (ref 8.4–10.5)
Chloride: 113 mEq/L — ABNORMAL HIGH (ref 96–112)
Creatinine, Ser: 0.86 mg/dL (ref 0.50–1.35)
GFR, EST AFRICAN AMERICAN: 80 mL/min — AB (ref >90)
GFR, EST NON AFRICAN AMERICAN: 69 mL/min — AB (ref >90)
Glucose, Bld: 134 mg/dL — ABNORMAL HIGH (ref 70–99)
POTASSIUM: 3.6 meq/L — AB (ref 3.7–5.3)
SODIUM: 144 meq/L (ref 137–147)

## 2013-06-17 LAB — CBC WITH DIFFERENTIAL/PLATELET
BASOS ABS: 0 10*3/uL (ref 0.0–0.1)
Basophils Relative: 0 % (ref 0–1)
EOS ABS: 0.2 10*3/uL (ref 0.0–0.7)
Eosinophils Relative: 2 % (ref 0–5)
HCT: 30.7 % — ABNORMAL LOW (ref 39.0–52.0)
HEMOGLOBIN: 10.2 g/dL — AB (ref 13.0–17.0)
LYMPHS PCT: 17 % (ref 12–46)
Lymphs Abs: 1.5 10*3/uL (ref 0.7–4.0)
MCH: 32.8 pg (ref 26.0–34.0)
MCHC: 33.2 g/dL (ref 30.0–36.0)
MCV: 98.7 fL (ref 78.0–100.0)
Monocytes Absolute: 0.9 10*3/uL (ref 0.1–1.0)
Monocytes Relative: 11 % (ref 3–12)
Neutro Abs: 6 10*3/uL (ref 1.7–7.7)
Neutrophils Relative %: 70 % (ref 43–77)
Platelets: 97 10*3/uL — ABNORMAL LOW (ref 150–400)
RBC: 3.11 MIL/uL — ABNORMAL LOW (ref 4.22–5.81)
RDW: 15.5 % (ref 11.5–15.5)
WBC: 8.6 10*3/uL (ref 4.0–10.5)

## 2013-06-17 LAB — GLUCOSE, CAPILLARY
GLUCOSE-CAPILLARY: 163 mg/dL — AB (ref 70–99)
GLUCOSE-CAPILLARY: 199 mg/dL — AB (ref 70–99)
Glucose-Capillary: 134 mg/dL — ABNORMAL HIGH (ref 70–99)
Glucose-Capillary: 181 mg/dL — ABNORMAL HIGH (ref 70–99)
Glucose-Capillary: 150 mg/dL — ABNORMAL HIGH (ref 70–99)

## 2013-06-17 LAB — FIBRINOGEN: Fibrinogen: 547 mg/dL — ABNORMAL HIGH (ref 204–475)

## 2013-06-17 LAB — MAGNESIUM: MAGNESIUM: 2.1 mg/dL (ref 1.5–2.5)

## 2013-06-17 MED ORDER — TRAZODONE HCL 50 MG PO TABS
50.0000 mg | ORAL_TABLET | Freq: Every evening | ORAL | Status: DC | PRN
  Filled 2013-06-17: qty 1

## 2013-06-17 NOTE — Evaluation (Signed)
Physical Therapy Evaluation Patient Details Name: David Davidson MRN: [id number] DOB: 1910-11-28 Today's Date: 06/17/2013 Time: 5643-3295 PT Time Calculation (min): 18 min  PT Assessment / Plan / Recommendation History of Present Illness  Pt is a 80 yr old WM admitted for sheptic shock from c diff.  pt has been in and out of the hospital since dec 1st and on several antiobiotics.  Now, recovering from septic shock.  pt with CKD stage 3 and has pulmonary edema.  Clinical Impression  Pt admitted with above. Pt currently with functional limitations due to the deficits listed below (see PT Problem List).  Pt will benefit from skilled PT to increase their independence and safety with mobility to allow discharge to the venue listed below.  Pt currently mod assist for balance during activity and presents as high fall risk.  Pt states he has award to receive tomorrow and would like to d/c home however would need assist for balance and safety.  Recommend ST-SNF prior to d/c home.     PT Assessment  Patient needs continued PT services    Follow Up Recommendations  SNF    Does the patient have the potential to tolerate intense rehabilitation      Barriers to Discharge        Equipment Recommendations  None recommended by PT    Recommendations for Other Services     Frequency Min 3X/week    Precautions / Restrictions Precautions Precautions: Fall Precaution Comments: Pt with significant posterior lean when standing. monitor sats Restrictions Weight Bearing Restrictions: No Other Position/Activity Restrictions: Monitor BP when up. Has had bouts of low BP since beginning of Dec.   Pertinent Vitals/Pain SATURATION QUALIFICATIONS: (This note is used to comply with regulatory documentation for home oxygen)  Patient Saturations on Room Air at Rest = 98%  Patient Saturations on Room Air while Ambulating = 83%  Patient Saturations on 2 Liters of oxygen while Ambulating = 90%  Please briefly  explain why patient needs home oxygen: to maintain saturations above 88% during ambulation      Mobility  Bed Mobility Bed Mobility: Sit to Supine Sit to Supine: 5: Supervision Details for Bed Mobility Assistance: increased time Transfers Transfers: Sit to Stand;Stand to Sit Sit to Stand: With upper extremity assist;From chair/3-in-1;4: Min assist Stand to Sit: With upper extremity assist;4: Min assist;To bed Details for Transfer Assistance: Pt requires cues for hand placement.  no posterior lean observed this afternoon however required assist to steady Ambulation/Gait Ambulation/Gait Assistance: 3: Mod assist Ambulation Distance (Feet): 160 Feet Assistive device: Rolling walker Ambulation/Gait Assistance Details: increased assist required for balance challenges such as turning around and head turns Gait Pattern: Step-through pattern;Trunk flexed;Decreased stride length Gait velocity: decr General Gait Details: SpO2 decreased to 83% room air so reapplied 2L O2 [place] and increased to 90%    Exercises     PT Diagnosis: Difficulty walking;Generalized weakness  PT Problem List: Decreased strength;Decreased activity tolerance;Decreased balance;Decreased mobility;Cardiopulmonary status limiting activity;Decreased knowledge of use of DME;Decreased safety awareness PT Treatment Interventions: Gait training;DME instruction;Functional mobility training;Therapeutic activities;Therapeutic exercise;Patient/family education;Neuromuscular re-education;Balance training;Stair training     PT Goals(Current goals can be found in the care plan section) Acute Rehab PT Goals Patient Stated Goal: to go back to my house PT Goal Formulation: With patient Time For Goal Achievement: 07/01/13 Potential to Achieve Goals: Good  Visit Information  Last PT Received On: 06/17/13 Assistance Needed: +2 History of Present Illness: Pt is a 80 yr old WM admitted  for sheptic shock from c diff.  pt has been in and  out of the hospital since dec 1st and on several antiobiotics.  Now, recovering from septic shock.  pt with CKD stage 3 and has pulmonary edema.       Prior Functioning  Home Living Family/patient expects to be discharged to:: Private residence Living Arrangements: Alone Available Help at Discharge: Family;Available PRN/intermittently Type of Home: House Home Access: Stairs to enter [employer] of Steps: 3-4 Entrance Stairs-Rails: Right;Left Home Layout: One level Home Equipment: Walker - 2 wheels;Cane - single point;Shower seat;Bedside commode Additional Comments: States son lives next door Prior Function Level of Independence: Independent with assistive device(s) Comments: family takes care of meals, laundry, finances and house cleaning. Communication Communication: No difficulties Dominant Hand: Right    Cognition  Cognition Arousal/Alertness: Awake/alert Behavior During Therapy: Impulsive Overall Cognitive Status: Within Functional Limits for tasks assessed    Extremity/Trunk Assessment Upper Extremity Assessment Upper Extremity Assessment: Overall WFL for tasks assessed Lower Extremity Assessment Lower Extremity Assessment: Generalized weakness Cervical / Trunk Assessment Cervical / Trunk Assessment: Normal   Balance Balance Balance Assessed: Yes Static Sitting Balance Static Sitting - Balance Support: Feet supported Static Sitting - Level of Assistance: 5: Stand by assistance Static Sitting - Comment/# of Minutes: 5 Static Standing Balance Static Standing - Balance Support: Bilateral upper extremity supported;During functional activity Static Standing - Level of Assistance: 2: Max assist Static Standing - Comment/# of Minutes: 2 High Level Balance High Level Balance Activites: Head turns High Level Balance Comments: mod assist for head turns during ambulation with RW  End of Session PT - End of Session Equipment Utilized During Treatment:  Oxygen Activity Tolerance: Patient tolerated treatment well Patient left: in bed;with call bell/phone within reach;with bed alarm set  GP     Merryn Thaker,KATHrine E 06/17/2013, 2:00 PM Zenovia Jarred, PT, DPT 06/17/2013 Pager: [phone]

## 2013-06-17 NOTE — Progress Notes (Addendum)
[facility]  Date of Admission:  06/14/2013  Antibiotics: Antibiotics Given (last 72 hours)   Date/Time Action Medication Dose Rate   06/14/13 1705 Given   vancomycin (VANCOCIN) 50 mg/mL oral solution 125 mg 125 mg    06/14/13 1733 Given   ceFEPIme (MAXIPIME) 1 g in dextrose 5 % 50 mL IVPB 1 g 100 mL/hr   06/14/13 2016 Given   vancomycin (VANCOCIN) 1,250 mg in sodium chloride 0.9 % 250 mL IVPB 1,250 mg 166.7 mL/hr   06/14/13 2256 Given   metroNIDAZOLE (FLAGYL) IVPB 500 mg 500 mg 100 mL/hr   06/14/13 2302 Given   vancomycin (VANCOCIN) 50 mg/mL oral solution 500 mg 500 mg    06/15/13 0503 Given   metroNIDAZOLE (FLAGYL) IVPB 500 mg 500 mg 100 mL/hr   06/15/13 0504 Given   vancomycin (VANCOCIN) 50 mg/mL oral solution 500 mg 500 mg    06/15/13 1154 Given   vancomycin (VANCOCIN) 50 mg/mL oral solution 500 mg 500 mg    06/15/13 1445 Given   metroNIDAZOLE (FLAGYL) IVPB 500 mg 500 mg 100 mL/hr   06/15/13 1705 Given   ceFEPIme (MAXIPIME) 1 g in dextrose 5 % 50 mL IVPB 1 g 100 mL/hr   06/15/13 1906 Given   vancomycin (VANCOCIN) 50 mg/mL oral solution 500 mg 500 mg    06/15/13 2052 Given   vancomycin (VANCOCIN) 1,250 mg in sodium chloride 0.9 % 250 mL IVPB 1,250 mg 166.7 mL/hr   06/15/13 2306 Given   metroNIDAZOLE (FLAGYL) IVPB 500 mg 500 mg 100 mL/hr   06/16/13 0015 Given   vancomycin (VANCOCIN) 50 mg/mL oral solution 500 mg 500 mg    06/16/13 0534 Given   metroNIDAZOLE (FLAGYL) IVPB 500 mg 500 mg 100 mL/hr   06/16/13 0706 Given   vancomycin (VANCOCIN) 50 mg/mL oral solution 500 mg 500 mg    06/16/13 1306 Given   vancomycin (VANCOCIN) 50 mg/mL oral solution 500 mg 500 mg    06/16/13 1503 Given   metroNIDAZOLE (FLAGYL) IVPB 500 mg 500 mg 100 mL/hr   06/16/13 1739 Given   vancomycin (VANCOCIN) 50 mg/mL oral solution 500 mg 500 mg    06/16/13 2203 Given   metroNIDAZOLE (FLAGYL) IVPB 500 mg 500 mg 100 mL/hr   06/17/13 0045 Given   vancomycin (VANCOCIN) 50  mg/mL oral solution 500 mg 500 mg    06/17/13 0541 Given   metroNIDAZOLE (FLAGYL) IVPB 500 mg 500 mg 100 mL/hr   06/17/13 0647 Given   vancomycin (VANCOCIN) 50 mg/mL oral solution 500 mg 500 mg    06/17/13 1256 Given   vancomycin (VANCOCIN) 50 mg/mL oral solution 500 mg 500 mg       Subjective: Feels better, less diarrhea  Objective: Temp:  [97.4 F (36.3 C)-97.9 F (36.6 C)] 97.4 F (36.3 C) (01/06 0439) Pulse Rate:  [100-106] 106 (01/06 1100) Resp:  [18-20] 18 (01/06 0439) BP: (90-117)/(51-65) 117/64 mmHg (01/06 1100) SpO2:  [84 %-100 %] 84 % (01/06 1100)  General: Awake, alert, oriented x 3, in chair, eating Skin: no rashes Lungs: CTA B Cor:  RRR without m Abdomen: soft, nt, nd, +normoactive bs Ext: no edema  Lab Results Lab Results  Component Value Date   WBC 8.6 06/17/2013   HGB 10.2* 06/17/2013   HCT 30.7* 06/17/2013   MCV 98.7 06/17/2013   PLT 97* 06/17/2013    Lab Results  Component Value Date   CREATININE 0.86 06/17/2013   BUN 24* 06/17/2013  NA 144 06/17/2013   K 3.6* 06/17/2013   CL 113* 06/17/2013   CO2 20 06/17/2013    Lab Results  Component Value Date   ALT 6 06/16/2013   AST 10 06/16/2013   ALKPHOS 40 06/16/2013   BILITOT 0.2* 06/16/2013      Microbiology: Recent Results (from the past 240 hour(s))  URINE CULTURE     Status: None   Collection Time    06/11/13 10:47 PM      Result Value Range Status   Specimen Description URINE, CLEAN CATCH   Final   Special Requests NONE   Final   Culture  Setup Time     Final   Value: 06/12/2013 05:11     Performed at [facility]   Colony Count     Final   Value: 75,000 COLONIES/ML     [facility]formed [facility]   Culture     Final   Value: Multiple bacterial morphotypes present, none predominant. Suggest appropriate recollection if clinically indicated.     Performed at [facility]   Report Status 06/13/2013 FINAL   Final  CULTURE, BLOOD (ROUTINE X 2)     Status: None   Collection Time     06/14/13 10:35 AM      Result Value Range Status   Specimen Description BLOOD LEFT ARM   Final   Special Requests BOTTLES DRAWN AEROBIC AND ANAEROBIC 5CC EACH   Final   Culture  Setup Time     Final   Value: 06/14/2013 18:10     Performed at [facility]   Culture     Final   Value:        BLOOD CULTURE RECEIVED NO GROWTH TO DATE CULTURE WILL BE HELD FOR 5 DAYS BEFORE ISSUING A FINAL NEGATIVE REPORT     Performed at [facility]   Report Status PENDING   Incomplete  CULTURE, BLOOD (ROUTINE X 2)     Status: None   Collection Time    06/14/13 11:30 AM      Result Value Range Status   Specimen Description BLOOD LEFT HAND  [facility]   Final   Special Requests NONE   Final   Culture  Setup Time     Final   Value: 06/14/2013 18:10     Performed at [facility]   Culture     Final   Value:        BLOOD CULTURE RECEIVED NO GROWTH TO DATE CULTURE WILL BE HELD FOR 5 DAYS BEFORE ISSUING A FINAL NEGATIVE REPORT     Performed at [facility]   Report Status PENDING   Incomplete  URINE CULTURE     Status: None   Collection Time    06/14/13 11:54 AM      Result Value Range Status   Specimen Description URINE, CLEAN CATCH   Final   Special Requests NONE   Final   Culture  Setup Time     Final   Value: 06/14/2013 20:04     Performed at [facility]   Colony Count     Final   Value: 40,000 COLONIES/ML     Performed at [facility]   Culture     Final   Value: Multiple bacterial morphotypes present, none predominant. Suggest appropriate recollection if clinically indicated.     Performed at [facility]   Report Status 06/15/2013 FINAL  Final  CLOSTRIDIUM DIFFICILE BY PCR     Status: Abnormal   Collection Time    06/14/13  5:16 PM      Result Value Range Status   C difficile by pcr POSITIVE (*) NEGATIVE Final   Comment: CRITICAL RESULT CALLED TO, READ BACK BY AND VERIFIED WITH:     HUDSON,L RN 06/14/13 2012 WOOTEN,K      Performed at [facility]  MRSA PCR SCREENING     Status: Abnormal   Collection Time    06/15/13  6:57 AM      Result Value Range Status   MRSA by PCR POSITIVE (*) NEGATIVE Final   Comment:            The GeneXpert MRSA Assay (FDA     approved for NASAL specimens     only), is one component of a     comprehensive MRSA colonization     surveillance program. It is not     intended to diagnose MRSA     infection nor to guide or     monitor treatment for     MRSA infections.     RESULT CALLED TO, READ BACK BY AND VERIFIED WITH:     K CHEEK AT 0944 ON 01.04.2015 BY NBROOKS    Studies/Results: Dg Chest Port 1 View  06/16/2013   CLINICAL DATA:  History sepsis.  Abdominal pain.  Shortness breath.  EXAM: PORTABLE CHEST - 1 VIEW  COMPARISON:  06/14/2013 and 05/20/2013.  FINDINGS: Post CABG.  Cardiomegaly.  Pulmonary vascular congestion/mild pulmonary edema.  Elevated left hemidiaphragm. Left base atelectasis. Limited for excluding left base infiltrate.  Calcified tortuous aorta.  No gross pneumothorax.  IMPRESSION: Post CABG.  Cardiomegaly.  Pulmonary vascular congestion/mild pulmonary edema.  Elevated left hemidiaphragm. Left base atelectasis. Limited for excluding left base infiltrate.  Calcified tortuous aorta.   Electronically Signed   By: Bridgett Larsson M.D.   On: 06/16/2013 11:08   Dg Abd Portable 1v  06/16/2013   CLINICAL DATA:  Abdominal pain.  EXAM: PORTABLE ABDOMEN - 1 VIEW  COMPARISON:  08/16/2011.  FINDINGS: Ileus appearing bowel gas pattern without findings for obstruction or perforation. The soft tissue shadows are maintained. The bony structures are intact.  IMPRESSION: Probable mild diffuse ileus.   Electronically Signed   By: Loralie Champagne M.D.   On: 06/16/2013 11:33    Assessment/Plan: 1) C diff infection - seems to be responding well to oral vancomycin.  WBC wnl. I will stop IV Flagyl.  -he will need a prolonged course of po vancomycin (4-6 weeks) as long as he continues  to do well -will continue to follow  Staci Righter, MD [facility] [email or web address] [phone] pager   [phone] cell 06/17/2013, 1:08 PM

## 2013-06-17 NOTE — Progress Notes (Signed)
Patient had a very restless night. Continuously asked for sleep medication and I explained how I had given him everything I could.  He just tossed and turned all night and just wanted to sleep but was unable to.  I assisted in repositioning along with medication and nothing helped, he was just restless. Will pass information to day shift nurse to bring up in huddle with the attending MD.

## 2013-06-17 NOTE — Progress Notes (Signed)
[facility] PROGRESS NOTE  David Davidson NUU:[id number] DOB: [birth year] DOA: 06/14/2013 PCP: [name], MD  Assessment/Plan  Septic shock due to C. Diff colitis.  Fever, lactic acidosis, and leukocytosis resolved.  uop improving.  BP more stable -  Repeat CXR and KUB:  No evidence of pneumonia or toxic megacolon -  UCx grew few colonies of mixed flora -  BCx NGTD -  Continue oral vanc, flagyl d/c'd today by ID  Acute hypoxic respiratory failure due to acute on chronic systolic heart failure from fluid resuscitation -  Carefully monitor BP -  Nasal canula as needed to maintain O2 saturations > 92% -  Will hold on starting lasix lasix due to recent severe and current borderline hypotension -  Consider trial of low dose lasix tomorrow if blood pressure holding  CAD/HTN/HLD, chest pain free.  - Patient not on ASA, BB, or statin for unclear reasons  - hold zetia and fish oil for now  Chronic systolic heart failure EF 35%  - Hold lasix and potassium   PAF, with persistent tachycardia, although HR appears to be trending down -  D/c telemetry - Will not try to reduce rate for now as may be appropriate compensation for level of illness.  - Hold A/C due to last months GIB and current illness   BPH, stable. Continue flomax   Hypothyroidism, stable. Continue levothyroxine.   DM, with mild hyperglycemia  - Hold glyburide  - Low dose SSI   Insomnia, continue ambien prn and mirtazapine   Leukocytosis, resolved with abx.   Chronic anemia and thrombocytopenia.  Hemoglobin stable and platelets recovering.  Acute phase reactants  -  Repeat in AM -  fibrinogen level normal and no schistocytes  CKD stage 3, stable. Minimize nephrotoxins and renally dose medications.   Creatinine trending down and metabolic acidosis resolving  Diet: Dysphagia 3  Access: PIV  IVF: yes  Proph: lovenox   Code Status: DNR  Family Communication: patient, son, daughter-in-law, extended family   Disposition Plan:   SNF   Consultants:  Infectious disease  Procedures:  CXR  Antibiotics:  Vancomycin IV 1/3 >> 1/5  Cefepime 1/3 >> 1/5  Vancomycin oral 1/3 >>  Flagyl 1/3 >>  1/6  HPI/Subjective:  Diarrhea slowing.  Feeling a little better today.  Denies SOB, cough, dysuria, or focal symptoms  Objective: Filed Vitals:   06/17/13 0439 06/17/13 1100 06/17/13 1519 06/17/13 1700  BP: 90/59 117/64 97/59   Pulse: 105 106 89   Temp: 97.4 F (36.3 C)  98 F (36.7 C)   TempSrc: Oral  Oral   Resp: 18  20   Height:      Weight:    90.719 kg (200 lb)  SpO2: 97% 84% 99%     Intake/Output Summary (Last 24 hours) at 06/17/13 2024 Last data filed at 06/17/13 1900  Gross per 24 hour  Intake    600 ml  Output    850 ml  Net   -250 ml   Filed Weights   06/15/13 1756 06/16/13 0443 06/17/13 1700  Weight: 88.9 kg (195 lb 15.8 oz) 89.903 kg (198 lb 3.2 oz) 90.719 kg (200 lb)    Exam:   General:  CM, No acute distress, appears better today  HEENT:  NCAT, MMM  Cardiovascular:  IRRR, nl S1, S2 no mrg, 2+ pulses, warm extremities  Respiratory:  Rales to the mid-back bilateral without wheeze or rhonchi, no increased WOB  Abdomen:   NABS, soft, mildly  distended, NT  MSK:   Normal tone and bulk, trace LEE  Neuro:  Grossly intact  Data Reviewed: Basic Metabolic Panel:  Recent Labs Lab 06/14/13 1150 06/15/13 0505 06/15/13 1619 06/16/13 0705 06/17/13 0445  NA 139 141 144 146 144  K 4.7 3.7 3.8 3.6* 3.6*  CL 103 108 112 114* 113*  CO2 23 19 19 20 20   GLUCOSE 170* 256* 162* 154* 134*  BUN 15 23 26* 26* 24*  CREATININE 0.96 1.21 1.17 1.07 0.86  CALCIUM 9.0 8.0* 7.8* 7.5* 7.7*  MG  --   --   --   --  2.1   Liver Function Tests:  Recent Labs Lab 06/11/13 2220 06/14/13 1150 06/15/13 1619 06/16/13 0705  AST 18 31 13 10   ALT 10 11 7 6   ALKPHOS 52 56 43 40  BILITOT 0.4 0.8 0.4 0.2*  PROT 7.2 7.4 6.0 5.7*  ALBUMIN 3.2* 3.3* 2.4* 2.3*   No results found  for this basename: LIPASE, AMYLASE,  in the last 168 hours No results found for this basename: AMMONIA,  in the last 168 hours CBC:  Recent Labs Lab 06/11/13 2220 06/14/13 1150 06/15/13 0505 06/15/13 1619 06/16/13 0705 06/17/13 0445  WBC 6.3 11.0* 9.3 10.0 9.0 8.6  NEUTROABS 3.9 9.1*  --   --  6.4 6.0  HGB 11.9* 12.3* 11.4* 11.2* 10.5* 10.2*  HCT 35.0* 37.3* 34.6* 33.8* 32.2* 30.7*  MCV 98.0 98.9 99.1 98.8 100.0 98.7  PLT 121* 103* 104* 106* 81* 97*   Cardiac Enzymes:  Recent Labs Lab 06/14/13 1150  TROPONINI <0.30   BNP (last 3 results)  Recent Labs  05/16/13 1403 05/20/13 0606 06/14/13 1150  PROBNP 2658.0* 1575.0* 879.1*   CBG:  Recent Labs Lab 06/16/13 1738 06/16/13 2200 06/17/13 0726 06/17/13 1201 06/17/13 1733  GLUCAP 163* 171* 134* 181* 150*    Recent Results (from the past 240 hour(s))  URINE CULTURE     Status: None   Collection Time    06/11/13 10:47 PM      Result Value Range Status   Specimen Description URINE, CLEAN CATCH   Final   Special Requests NONE   Final   Culture  Setup Time     Final   Value: 06/12/2013 05:11     Performed at [facility] Count     Final   Value: 75,000 COLONIES/ML     Performed at [facility]   Culture     Final   Value: Multiple bacterial morphotypes present, none predominant. Suggest appropriate recollection if clinically indicated.     Performed at [facility]   Report Status 06/13/2013 FINAL   Final  CULTURE, BLOOD (ROUTINE X 2)     Status: None   Collection Time    06/14/13 10:35 AM      Result Value Range Status   Specimen Description BLOOD LEFT ARM   Final   Special Requests BOTTLES DRAWN AEROBIC AND ANAEROBIC 5CC EACH   Final   Culture  Setup Time     Final   Value: 06/14/2013 18:10     Performed at [facility]   Culture     Final   Value:        BLOOD CULTURE RECEIVED NO GROWTH TO DATE CULTURE WILL BE HELD FOR 5 DAYS BEFORE ISSUING A FINAL NEGATIVE  REPORT     Performed at [facility]   Report Status PENDING   Incomplete  CULTURE, BLOOD (ROUTINE X 2)     Status: None   Collection Time    06/14/13 11:30 AM      Result Value Range Status   Specimen Description BLOOD LEFT HAND  5 ML IN [facility] BOTTLE   Final   Special Requests NONE   Final   Culture  Setup Time     Final   Value: 06/14/2013 18:10     Performed at [facility]   Culture     Final   Value:        BLOOD CULTURE RECEIVED NO GROWTH TO DATE CULTURE WILL BE HELD FOR 5 DAYS BEFORE ISSUING A FINAL NEGATIVE REPORT     Performed at [facility]   Report Status PENDING   Incomplete  URINE CULTURE     Status: None   Collection Time    06/14/13 11:54 AM      Result Value Range Status   Specimen Description URINE, CLEAN CATCH   Final   Special Requests NONE   Final   Culture  Setup Time     Final   Value: 06/14/2013 20:04     Performed at [facility] Count     Final   Value: 40,000 COLONIES/ML     Performed at [facility]   Culture     Final   Value: Multiple bacterial morphotypes present, none predominant. Suggest appropriate recollection if clinically indicated.     Performed at [facility]   Report Status 06/15/2013 FINAL   Final  CLOSTRIDIUM DIFFICILE BY PCR     Status: Abnormal   Collection Time    06/14/13  5:16 PM      Result Value Range Status   C difficile by pcr POSITIVE (*) NEGATIVE Final   Comment: CRITICAL RESULT CALLED TO, READ BACK BY AND VERIFIED WITH:     HUDSON,L RN 06/14/13 2012 WOOTEN,K     Performed at [facility]  MRSA PCR SCREENING     Status: Abnormal   Collection Time    06/15/13  6:57 AM      Result Value Range Status   MRSA by PCR POSITIVE (*) NEGATIVE Final   Comment:            The GeneXpert MRSA Assay (FDA     approved for NASAL specimens     only), is one component of a     comprehensive MRSA colonization     surveillance program. It is not     intended to diagnose  MRSA     infection nor to guide or     monitor treatment for     MRSA infections.     RESULT CALLED TO, READ BACK BY AND VERIFIED WITH:     K CHEEK AT 0944 ON 01.04.2015 BY NBROOKS     Studies: Dg Chest [facility] 1 View  06/16/2013   CLINICAL DATA:  History sepsis.  Abdominal pain.  Shortness breath.  EXAM: PORTABLE CHEST - 1 VIEW  COMPARISON:  06/14/2013 and 05/20/2013.  FINDINGS: Post CABG.  Cardiomegaly.  Pulmonary vascular congestion/mild pulmonary edema.  Elevated left hemidiaphragm. Left base atelectasis. Limited for excluding left base infiltrate.  Calcified tortuous aorta.  No gross pneumothorax.  IMPRESSION: Post CABG.  Cardiomegaly.  Pulmonary vascular congestion/mild pulmonary edema.  Elevated left hemidiaphragm. Left base atelectasis. Limited for excluding left base infiltrate.  Calcified tortuous aorta.   Electronically Signed   By: Kandice Hams.D.  On: 06/16/2013 11:08   Dg Abd Portable 1v  06/16/2013   CLINICAL DATA:  Abdominal pain.  EXAM: PORTABLE ABDOMEN - 1 VIEW  COMPARISON:  08/16/2011.  FINDINGS: Ileus appearing bowel gas pattern without findings for obstruction or perforation. The soft tissue shadows are maintained. The bony structures are intact.  IMPRESSION: Probable mild diffuse ileus.   Electronically Signed   By: Loralie Champagne M.D.   On: 06/16/2013 11:33    Scheduled Meds: . feeding supplement (GLUCERNA SHAKE)  237 mL Oral Q24H  . finasteride  5 mg Oral [id number]  . insulin aspart  0-9 Units Subcutaneous TID WC  . mirtazapine  15 mg Oral QHS  . sodium chloride  3 mL Intravenous Q12H  . tamsulosin  0.4 mg Oral QPC supper  . vancomycin  500 mg Oral Q6H   Continuous Infusions: . sodium chloride 20 mL/hr at 06/16/13 1632    Principal Problem:   Septic shock Active Problems:   CAD (coronary artery disease)   Paroxysmal a-fib   DM (diabetes mellitus), type 2, uncontrolled with complications   Hypertension   Hypothyroidism   Recurrent colitis due to Clostridium  difficile   Anemia   Thrombocytopenia   Fever   C. difficile diarrhea    Time spent: 30 min    Alaylah Heatherington, [facility]  [facility] Pager [phone]. If 7PM-7AM, please contact night-coverage at [email or web address], password [facility] 06/17/2013, 8:24 PM  LOS: 3 days

## 2013-06-17 NOTE — Evaluation (Signed)
Occupational Therapy Evaluation Patient Details Name: David Davidson MRN: [id number] DOB: March 15, 1911 Today's Date: 06/17/2013 Time: 9562-1308 OT Time Calculation (min): 44 min  OT Assessment / Plan / Recommendation History of present illness Pt is a 80 yr old WM admitted for sheptic shock from c diff.  pt has been in and out of the hospital since dec 1st and on several antiobiotics.  Now, recovering from septic shock.  pt with CKD stage 3 and has pulmonary edema.   Clinical Impression   Pt admitted several times in last month for above issues and has the deficits listed below.  Pt would benefit from cont OT to increase I with basic adls so he can eventually d/c home alone.    OT Assessment  Patient needs continued OT Services    Follow Up Recommendations  SNF;Supervision/Assistance - 24 hour    Barriers to Discharge Decreased caregiver support pt lives alone. son lives next Chief Financial Officer Recommendations  None recommended by OT    Recommendations for Other Services PT consult  Frequency  Min 2X/week    Precautions / Restrictions Precautions Precautions: Fall Precaution Comments: Pt with significant posterior lean when standing. Restrictions Weight Bearing Restrictions: No Other Position/Activity Restrictions: Monitor BP when up. Has had bouts of low BP since beginning of Dec.   Pertinent Vitals/Pain Pt BP 117/64, HR 102.  Pain 0/10    ADL  Eating/Feeding: Performed;Set up Where Assessed - Eating/Feeding: Chair Grooming: Performed;Set up Where Assessed - Grooming: Supported sitting Upper Body Bathing: Simulated;Set up Where Assessed - Upper Body Bathing: Supported sitting Lower Body Bathing: Simulated;Moderate assistance Where Assessed - Lower Body Bathing: Supported sit to stand Upper Body Dressing: Simulated;Minimal assistance Where Assessed - Upper Body Dressing: Supported sitting Lower Body Dressing: Performed;Moderate assistance Where Assessed - Lower Body  Dressing: Supported sit to Pharmacist, hospital: Performed;Moderate assistance Toilet Transfer Method: Stand pivot Toilet Transfer Equipment: Comfort height toilet;Grab bars Toileting - Clothing Manipulation and Hygiene: Performed;Maximal assistance Where Assessed - Glass blower/designer Manipulation and Hygiene: Standing Equipment Used: Rolling walker Transfers/Ambulation Related to ADLs: Pt with very poor balance in standing.  Pt a bit impulsive and has a posterior lean when on his feet and unable to stand w/o holding onto walker. ADL Comments: Pt does well with most adls in sitting.  Once standing pt needs mod to max assist due to decreased standing balance.    OT Diagnosis: Generalized weakness  OT Problem List: Decreased strength;Decreased activity tolerance;Impaired balance (sitting and/or standing);Decreased safety awareness;Decreased knowledge of use of DME or AE OT Treatment Interventions: Self-care/ADL training;Balance training;Therapeutic activities   OT Goals(Current goals can be found in the care plan section) Acute Rehab OT Goals Patient Stated Goal: to go back to my house OT Goal Formulation: With patient/family Time For Goal Achievement: 07/01/13 Potential to Achieve Goals: Good ADL Goals Pt Will Perform Grooming: with supervision;standing Pt Will Perform Lower Body Bathing: with supervision;sit to/from stand Pt Will Perform Lower Body Dressing: with supervision;sit to/from stand Pt Will Transfer to Toilet: with supervision;ambulating;grab bars;bedside commode Pt Will Perform Toileting - Clothing Manipulation and hygiene: with supervision;sit to/from stand Pt Will Perform Tub/Shower Transfer: with min guard assist;rolling walker;shower seat;ambulating;Shower transfer Additional ADL Goal #1: Pt will stand for 3 minutes at bedside w S only in prep for more adls in standing w/o holding to walker.  Visit Information  Last OT Received On: 06/17/13 Assistance Needed: +1 (+2 if  walking) History of Present Illness: Pt is a 80 yr old WM  admitted for sheptic shock from c diff.  pt has been in and out of the hospital since dec 1st and on several antiobiotics.  Now, recovering from septic shock.  pt with CKD stage 3 and has pulmonary edema.       Prior Functioning     Home Living Family/patient expects to be discharged to:: Private residence Living Arrangements: Alone Available Help at Discharge: Family;Available PRN/intermittently Type of Home: House Home Access: Stairs to enter [employer] of Steps: 3-4 Entrance Stairs-Rails: Right;Left Home Layout: One level Home Equipment: Walker - 2 wheels;Cane - single point;Shower seat;Bedside commode Additional Comments: States son lives next door Prior Function Level of Independence: Independent with assistive device(s) Comments: family takes care of meals, laundry, finances and house cleaning. Communication Communication: No difficulties Dominant Hand: Right         Vision/Perception Vision - History Baseline Vision: Wears glasses all the time Patient Visual Report: No change from baseline Vision - Assessment Vision Assessment: Vision tested Ocular Range of Motion: Within Functional Limits Tracking/Visual Pursuits: Able to track stimulus in all quads without difficulty Visual Rosko: No apparent deficits   Cognition  Cognition Arousal/Alertness: Awake/alert Behavior During Therapy: Impulsive Overall Cognitive Status: Within Functional Limits for tasks assessed    Extremity/Trunk Assessment Upper Extremity Assessment Upper Extremity Assessment: Overall WFL for tasks assessed Lower Extremity Assessment Lower Extremity Assessment: Defer to PT evaluation Cervical / Trunk Assessment Cervical / Trunk Assessment: Normal     Mobility Bed Mobility Bed Mobility: Not assessed (pt in chair on arrival) Transfers Transfers: Sit to Stand;Stand to Sit Sit to Stand: 3: Mod assist;With upper  extremity assist;From chair/3-in-1 Stand to Sit: 3: Mod assist;With upper extremity assist;To chair/3-in-1 Details for Transfer Assistance: Pt requires cues for hand placement.  Pt likes to grab onto furniture (that moves) to stand.  Pt impulsive during transfers.     Exercise     Balance Balance Balance Assessed: Yes Static Sitting Balance Static Sitting - Balance Support: Feet supported Static Sitting - Level of Assistance: 5: Stand by assistance Static Sitting - Comment/# of Minutes: 5 Static Standing Balance Static Standing - Balance Support: Bilateral upper extremity supported;During functional activity Static Standing - Level of Assistance: 2: Max assist Static Standing - Comment/# of Minutes: 2   End of Session OT - End of Session Equipment Utilized During Treatment: Rolling walker Activity Tolerance: Patient tolerated treatment well Patient left: in chair;with call bell/phone within reach;with family/visitor present Nurse Communication: Mobility status;Other (comment) (O2 sats dropped to 82 when O2 removed.)  GO     Hope Budds 06/17/2013, 11:45 AM [phone]

## 2013-06-18 LAB — CBC WITH DIFFERENTIAL/PLATELET
Basophils Absolute: 0 10*3/uL (ref 0.0–0.1)
Basophils Relative: 1 % (ref 0–1)
Eosinophils Absolute: 0.3 10*3/uL (ref 0.0–0.7)
Eosinophils Relative: 4 % (ref 0–5)
HEMATOCRIT: 33.4 % — AB (ref 39.0–52.0)
Hemoglobin: 11.1 g/dL — ABNORMAL LOW (ref 13.0–17.0)
LYMPHS ABS: 1.6 10*3/uL (ref 0.7–4.0)
Lymphocytes Relative: 21 % (ref 12–46)
MCH: 32.7 pg (ref 26.0–34.0)
MCHC: 33.2 g/dL (ref 30.0–36.0)
MCV: 98.5 fL (ref 78.0–100.0)
Monocytes Absolute: 0.8 10*3/uL (ref 0.1–1.0)
Monocytes Relative: 11 % (ref 3–12)
NEUTROS ABS: 4.9 10*3/uL (ref 1.7–7.7)
NEUTROS PCT: 65 % (ref 43–77)
PLATELETS: 103 10*3/uL — AB (ref 150–400)
RBC: 3.39 MIL/uL — ABNORMAL LOW (ref 4.22–5.81)
RDW: 15.2 % (ref 11.5–15.5)
WBC: 7.6 10*3/uL (ref 4.0–10.5)

## 2013-06-18 LAB — GLUCOSE, CAPILLARY
GLUCOSE-CAPILLARY: 163 mg/dL — AB (ref 70–99)
GLUCOSE-CAPILLARY: 180 mg/dL — AB (ref 70–99)
Glucose-Capillary: 149 mg/dL — ABNORMAL HIGH (ref 70–99)
Glucose-Capillary: 192 mg/dL — ABNORMAL HIGH (ref 70–99)

## 2013-06-18 LAB — BASIC METABOLIC PANEL
BUN: 18 mg/dL (ref 6–23)
CHLORIDE: 109 meq/L (ref 96–112)
CO2: 22 mEq/L (ref 19–32)
Calcium: 8 mg/dL — ABNORMAL LOW (ref 8.4–10.5)
Creatinine, Ser: 0.76 mg/dL (ref 0.50–1.35)
GFR calc non Af Amer: 72 mL/min — ABNORMAL LOW (ref >90)
GFR, EST AFRICAN AMERICAN: 84 mL/min — AB (ref >90)
Glucose, Bld: 134 mg/dL — ABNORMAL HIGH (ref 70–99)
POTASSIUM: 3.2 meq/L — AB (ref 3.7–5.3)
SODIUM: 142 meq/L (ref 137–147)

## 2013-06-18 MED ORDER — VANCOMYCIN 50 MG/ML ORAL SOLUTION
125.0000 mg | Freq: Four times a day (QID) | ORAL | Status: DC
  Administered 2013-06-18 – 2013-06-19 (×5): 125 mg via ORAL
  Filled 2013-06-18 (×8): qty 2.5

## 2013-06-18 MED ORDER — FUROSEMIDE 20 MG PO TABS
20.0000 mg | ORAL_TABLET | ORAL | Status: DC
  Administered 2013-06-18: 20 mg via ORAL
  Filled 2013-06-18 (×2): qty 1

## 2013-06-18 NOTE — Progress Notes (Addendum)
[facility]  Date of Admission:  06/14/2013  Antibiotics: Antibiotics Given (last 72 hours)   Date/Time Action Medication Dose Rate   06/15/13 1154 Given   vancomycin (VANCOCIN) 50 mg/mL oral solution 500 mg 500 mg    06/15/13 1445 Given   metroNIDAZOLE (FLAGYL) IVPB 500 mg 500 mg 100 mL/hr   06/15/13 1705 Given   ceFEPIme (MAXIPIME) 1 g in dextrose 5 % 50 mL IVPB 1 g 100 mL/hr   06/15/13 1906 Given   vancomycin (VANCOCIN) 50 mg/mL oral solution 500 mg 500 mg    06/15/13 2052 Given   vancomycin (VANCOCIN) 1,250 mg in sodium chloride 0.9 % 250 mL IVPB 1,250 mg 166.7 mL/hr   06/15/13 2306 Given   metroNIDAZOLE (FLAGYL) IVPB 500 mg 500 mg 100 mL/hr   06/16/13 0015 Given   vancomycin (VANCOCIN) 50 mg/mL oral solution 500 mg 500 mg    06/16/13 0534 Given   metroNIDAZOLE (FLAGYL) IVPB 500 mg 500 mg 100 mL/hr   06/16/13 0706 Given   vancomycin (VANCOCIN) 50 mg/mL oral solution 500 mg 500 mg    06/16/13 1306 Given   vancomycin (VANCOCIN) 50 mg/mL oral solution 500 mg 500 mg    06/16/13 1503 Given   metroNIDAZOLE (FLAGYL) IVPB 500 mg 500 mg 100 mL/hr   06/16/13 1739 Given   vancomycin (VANCOCIN) 50 mg/mL oral solution 500 mg 500 mg    06/16/13 2203 Given   metroNIDAZOLE (FLAGYL) IVPB 500 mg 500 mg 100 mL/hr   06/17/13 0045 Given   vancomycin (VANCOCIN) 50 mg/mL oral solution 500 mg 500 mg    06/17/13 0541 Given   metroNIDAZOLE (FLAGYL) IVPB 500 mg 500 mg 100 mL/hr   06/17/13 0647 Given   vancomycin (VANCOCIN) 50 mg/mL oral solution 500 mg 500 mg    06/17/13 1256 Given   vancomycin (VANCOCIN) 50 mg/mL oral solution 500 mg 500 mg    06/17/13 1743 Given   vancomycin (VANCOCIN) 50 mg/mL oral solution 500 mg 500 mg    06/17/13 2300 Given  [pt preferenc e]   vancomycin (VANCOCIN) 50 mg/mL oral solution 500 mg 500 mg    06/18/13 0646 Given   vancomycin (VANCOCIN) 50 mg/mL oral solution 500 mg 500 mg       Subjective: No new  complaints  Objective: Temp:  [97.5 F (36.4 C)-98 F (36.7 C)] 97.5 F (36.4 C) (01/07 0752) Pulse Rate:  [89-125] 125 (01/07 0752) Resp:  [18-20] 20 (01/07 0752) BP: (90-109)/(57-68) 109/68 mmHg (01/07 0752) SpO2:  [95 %-99 %] 99 % (01/07 0752) Weight:  [200 lb (90.719 kg)-202 lb (91.627 kg)] 202 lb (91.627 kg) (01/07 0410)  General: Awake, in chair  Lab Results Lab Results  Component Value Date   WBC 7.6 06/18/2013   HGB 11.1* 06/18/2013   HCT 33.4* 06/18/2013   MCV 98.5 06/18/2013   PLT 103* 06/18/2013    Lab Results  Component Value Date   CREATININE 0.76 06/18/2013   BUN 18 06/18/2013   NA 142 06/18/2013   K 3.2* 06/18/2013   CL 109 06/18/2013   CO2 22 06/18/2013    Lab Results  Component Value Date   ALT 6 06/16/2013   AST 10 06/16/2013   ALKPHOS 40 06/16/2013   BILITOT 0.2* 06/16/2013      Microbiology: Recent Results (from the past 240 hour(s))  URINE CULTURE     Status: None   Collection Time    06/11/13 10:47 PM  Result Value Range Status   Specimen Description URINE, CLEAN CATCH   Final   Special Requests NONE   Final   Culture  Setup Time     Final   Value: 06/12/2013 05:11     Performed at [facility] Count     Final   Value: 75,000 COLONIES/ML     Performed at [facility]   Culture     Final   Value: Multiple bacterial morphotypes present, none predominant. Suggest appropriate recollection if clinically indicated.     Performed at [facility]   Report Status 06/13/2013 FINAL   Final  CULTURE, BLOOD (ROUTINE X 2)     Status: None   Collection Time    06/14/13 10:35 AM      Result Value Range Status   Specimen Description BLOOD LEFT ARM   Final   Special Requests BOTTLES DRAWN AEROBIC AND ANAEROBIC 5CC EACH   Final   Culture  Setup Time     Final   Value: 06/14/2013 18:10     Performed at [facility]   Culture     Final   Value:        BLOOD CULTURE RECEIVED NO GROWTH TO DATE CULTURE WILL BE HELD FOR 5 DAYS BEFORE  ISSUING A FINAL NEGATIVE REPORT     Performed at [facility]   Report Status PENDING   Incomplete  CULTURE, BLOOD (ROUTINE X 2)     Status: None   Collection Time    06/14/13 11:30 AM      Result Value Range Status   Specimen Description BLOOD LEFT HAND  5 ML IN [facility] BOTTLE   Final   Special Requests NONE   Final   Culture  Setup Time     Final   Value: 06/14/2013 18:10     Performed at [facility]   Culture     Final   Value:        BLOOD CULTURE RECEIVED NO GROWTH TO DATE CULTURE WILL BE HELD FOR 5 DAYS BEFORE ISSUING A FINAL NEGATIVE REPORT     Performed at [facility]   Report Status PENDING   Incomplete  URINE CULTURE     Status: None   Collection Time    06/14/13 11:54 AM      Result Value Range Status   Specimen Description URINE, CLEAN CATCH   Final   Special Requests NONE   Final   Culture  Setup Time     Final   Value: 06/14/2013 20:04     Performed at [facility] Count     Final   Value: 40,000 COLONIES/ML     Performed at [facility]   Culture     Final   Value: Multiple bacterial morphotypes present, none predominant. Suggest appropriate recollection if clinically indicated.     Performed at [facility]   Report Status 06/15/2013 FINAL   Final  CLOSTRIDIUM DIFFICILE BY PCR     Status: Abnormal   Collection Time    06/14/13  5:16 PM      Result Value Range Status   C difficile by pcr POSITIVE (*) NEGATIVE Final   Comment: CRITICAL RESULT CALLED TO, READ BACK BY AND VERIFIED WITH:     HUDSON,L RN 06/14/13 2012 WOOTEN,K     Performed at [facility]  MRSA PCR SCREENING     Status: Abnormal  Collection Time    06/15/13  6:57 AM      Result Value Range Status   MRSA by PCR POSITIVE (*) NEGATIVE Final   Comment:            The GeneXpert MRSA Assay (FDA     approved for NASAL specimens     only), is one component of a     comprehensive MRSA colonization     surveillance program. It is not      intended to diagnose MRSA     infection nor to guide or     monitor treatment for     MRSA infections.     RESULT CALLED TO, READ BACK BY AND VERIFIED WITH:     K CHEEK AT 0944 ON 01.04.2015 BY NBROOKS    Studies/Results: No results found.  Assessment/Plan: 1) C diff infection - seems to be responding well to oral vancomycin.  WBC wnl. -he will need a prolonged course of po vancomycin (6-8 weeks) as long as he continues to do well -I will reduce the vancomycin to 125 mg qid -can titrate vancomycin after discharge with 2 more weeks at 4 times per day, then 2 weeks at 2 times per day, then 2 weeks daily then 2 weeks every other day and then stop  Staci Righter, MD [facility] [email or web address] [phone] pager   [phone] cell 06/18/2013, 11:19 AM

## 2013-06-18 NOTE — Progress Notes (Signed)
[facility] PROGRESS NOTE  David Davidson ZOX:[id number] DOB: 1910/10/24 DOA: 06/14/2013 PCP: [name], MD  Assessment/Plan: Septic shock due to C. Diff colitis.  Fever, lactic acidosis, and leukocytosis resolved. uop improving. BP more stable  - Repeat CXR and KUB: No evidence of pneumonia or toxic megacolon  - UCx grew few colonies of mixed flora  - BCx NGTD  - Continue oral vanc--taper plan noted as per ID Acute hypoxic respiratory failure -Currently stable on room air - due to acute on chronic systolic heart failure from fluid resuscitation  - Carefully monitor BP  - Nasal canula as needed to maintain O2 saturations > 92%  - Restart furosemide 20 mg this evening -Echocardiogram EF 35-40%- CAD/HTN/HLD,  -chest pain free.  - Patient not on ASA, BB, or statin for unclear reasons  - hold zetia and fish oil for now  Chronic systolic heart failure EF 35%   PAF,  -HR appears to be trending down  - D/c telemetry  - Will not try to reduce rate for now as may be appropriate compensation for level of illness.  - Hold A/C due to last months GIB and current illness  BPH, stable. Continue flomax  Hypothyroidism,  -stable. Continue levothyroxine.  DM, with mild hyperglycemia  - Hold glyburide  - Low dose SSI  Insomnia,  -continue ambien prn and mirtazapine  Leukocytosis, - resolved with abx.  Chronic anemia and thrombocytopenia. - Hemoglobin stable and platelets recovering. Acute phase reactants  - Repeat in AM  - fibrinogen level normal and no schistocytes  CKD stage 3,  stable. Minimize nephrotoxins and renally dose medications.  Creatinine trending down and metabolic acidosis resolving  Diet: Dysphagia 3  Access: PIV  IVF: yes  Proph: lovenox  Code Status: DNR  Family Communication: updated daughter-in-law Disposition Plan: SNF           Procedures/Studies: Dg Chest 2 View  05/20/2013   CLINICAL DATA:  Shortness of breath.  EXAM: CHEST  2 VIEW   COMPARISON:  Chest radiograph May 13, 2013  FINDINGS: The cardiac silhouette appears mildly enlarged, improved from prior examination. Status post median sternotomy for apparent Coronary artery bypass grafting. Mild central pulmonary vasculature congestion, decreased. Bandlike densities in left lung base. No pleural effusions. No pneumothorax.  Multiple EKG lines overlie the patient and may obscure subtle underlying pathology. Soft tissue planes and included osseous structures are nonsuspicious.  IMPRESSION: Improved appearance of chest: Decreased cardiomegaly, with mild central pulmonary vasculature congestion, left lung base atelectasis.   Electronically Signed   By: Awilda Metro   On: 05/20/2013 06:13   Dg Chest Port 1 View  06/16/2013   CLINICAL DATA:  History sepsis.  Abdominal pain.  Shortness breath.  EXAM: PORTABLE CHEST - 1 VIEW  COMPARISON:  06/14/2013 and 05/20/2013.  FINDINGS: Post CABG.  Cardiomegaly.  Pulmonary vascular congestion/mild pulmonary edema.  Elevated left hemidiaphragm. Left base atelectasis. Limited for excluding left base infiltrate.  Calcified tortuous aorta.  No gross pneumothorax.  IMPRESSION: Post CABG.  Cardiomegaly.  Pulmonary vascular congestion/mild pulmonary edema.  Elevated left hemidiaphragm. Left base atelectasis. Limited for excluding left base infiltrate.  Calcified tortuous aorta.   Electronically Signed   By: Bridgett Larsson M.D.   On: 06/16/2013 11:08   Dg Chest Port 1 View  06/14/2013   CLINICAL DATA:  Fever, weakness, body aches  EXAM: PORTABLE CHEST - 1 VIEW  COMPARISON:  None.  FINDINGS: Bilateral diffuse interstitial thickening and peribronchial cuffing most concerning for bronchitis.  There is no pleural effusion or pneumothorax. Stable cardiomegaly. Prior CABG.  Degenerative changes of bilateral acromioclavicular joints.  IMPRESSION: Bilateral diffuse interstitial thickening and peribronchial cuffing most concerning for bronchitis.   Electronically Signed    By: Elige Ko   On: 06/14/2013 11:37   Dg Abd Portable 1v  06/16/2013   CLINICAL DATA:  Abdominal pain.  EXAM: PORTABLE ABDOMEN - 1 VIEW  COMPARISON:  08/16/2011.  FINDINGS: Ileus appearing bowel gas pattern without findings for obstruction or perforation. The soft tissue shadows are maintained. The bony structures are intact.  IMPRESSION: Probable mild diffuse ileus.   Electronically Signed   By: Loralie Champagne M.D.   On: 06/16/2013 11:33         Subjective: Patient has not had a bowel movement in 2 days. Denies any fevers, chills, chest discomfort, nausea, vomiting, diarrhea, abdominal pain, dysuria.  Objective: Filed Vitals:   06/17/13 2132 06/18/13 0410 06/18/13 0752 06/18/13 1309  BP: 90/57 109/57 109/68 110/54  Pulse: 106 104 125 98  Temp: 97.9 F (36.6 C) 97.5 F (36.4 C) 97.5 F (36.4 C) 97.5 F (36.4 C)  TempSrc: Oral Oral Oral Oral  Resp: 18 19 20 20   Height:      Weight:  91.627 kg (202 lb)    SpO2: 99% 95% 99% 98%    Intake/Output Summary (Last 24 hours) at 06/18/13 1624 Last data filed at 06/18/13 1309  Gross per 24 hour  Intake   1760 ml  Output    850 ml  Net    910 ml   Weight change:  Exam:   General:  Pt is alert, follows commands appropriately, not in acute distress  HEENT: No icterus, No thrush,  [place]/AT  Cardiovascular: IRRR, S1/S2, no rubs, no gallops  Respiratory: CTA bilaterally, no wheezing, no crackles, no rhonchi  Abdomen: Soft/+BS, non tender, non distended, no guarding  Extremities: 1+ LE edema, No lymphangitis, No petechiae, No rashes, no synovitis  Data Reviewed: Basic Metabolic Panel:  Recent Labs Lab 06/15/13 0505 06/15/13 1619 06/16/13 0705 06/17/13 0445 06/18/13 0433  NA 141 144 146 144 142  K 3.7 3.8 3.6* 3.6* 3.2*  CL 108 112 114* 113* 109  CO2 19 19 20 20 22   GLUCOSE 256* 162* 154* 134* 134*  BUN 23 26* 26* 24* 18  CREATININE 1.21 1.17 1.07 0.86 0.76  CALCIUM 8.0* 7.8* 7.5* 7.7* 8.0*  MG  --   --   --  2.1   --    Liver Function Tests:  Recent Labs Lab 06/11/13 2220 06/14/13 1150 06/15/13 1619 06/16/13 0705  AST 18 31 13 10   ALT 10 11 7 6   ALKPHOS 52 56 43 40  BILITOT 0.4 0.8 0.4 0.2*  PROT 7.2 7.4 6.0 5.7*  ALBUMIN 3.2* 3.3* 2.4* 2.3*   No results found for this basename: LIPASE, AMYLASE,  in the last 168 hours No results found for this basename: AMMONIA,  in the last 168 hours CBC:  Recent Labs Lab 06/11/13 2220 06/14/13 1150 06/15/13 0505 06/15/13 1619 06/16/13 0705 06/17/13 0445 06/18/13 0433  WBC 6.3 11.0* 9.3 10.0 9.0 8.6 7.6  NEUTROABS 3.9 9.1*  --   --  6.4 6.0 4.9  HGB 11.9* 12.3* 11.4* 11.2* 10.5* 10.2* 11.1*  HCT 35.0* 37.3* 34.6* 33.8* 32.2* 30.7* 33.4*  MCV 98.0 98.9 99.1 98.8 100.0 98.7 98.5  PLT 121* 103* 104* 106* 81* 97* 103*   Cardiac Enzymes:  Recent Labs Lab 06/14/13 1150  TROPONINI <  0.30   BNP: No components found with this basename: POCBNP,  CBG:  Recent Labs Lab 06/17/13 1201 06/17/13 1733 06/17/13 2130 06/18/13 0740 06/18/13 1135  GLUCAP 181* 150* 199* 149* 192*    Recent Results (from the past 240 hour(s))  URINE CULTURE     Status: None   Collection Time    06/11/13 10:47 PM      Result Value Range Status   Specimen Description URINE, CLEAN CATCH   Final   Special Requests NONE   Final   Culture  Setup Time     Final   Value: 06/12/2013 05:11     Performed at [facility] Count     Final   Value: 75,000 COLONIES/ML     Performed at [facility]   Culture     Final   Value: Multiple bacterial morphotypes present, none predominant. Suggest appropriate recollection if clinically indicated.     Performed at [facility]   Report Status 06/13/2013 FINAL   Final  CULTURE, BLOOD (ROUTINE X 2)     Status: None   Collection Time    06/14/13 10:35 AM      Result Value Range Status   Specimen Description BLOOD LEFT ARM   Final   Special Requests BOTTLES DRAWN AEROBIC AND ANAEROBIC 5CC EACH    Final   Culture  Setup Time     Final   Value: 06/14/2013 18:10     Performed at [facility]   Culture     Final   Value:        BLOOD CULTURE RECEIVED NO GROWTH TO DATE CULTURE WILL BE HELD FOR 5 DAYS BEFORE ISSUING A FINAL NEGATIVE REPORT     Performed at [facility]   Report Status PENDING   Incomplete  CULTURE, BLOOD (ROUTINE X 2)     Status: None   Collection Time    06/14/13 11:30 AM      Result Value Range Status   Specimen Description BLOOD LEFT HAND  5 ML IN [facility] BOTTLE   Final   Special Requests NONE   Final   Culture  Setup Time     Final   Value: 06/14/2013 18:10     Performed at [facility]   Culture     Final   Value:        BLOOD CULTURE RECEIVED NO GROWTH TO DATE CULTURE WILL BE HELD FOR 5 DAYS BEFORE ISSUING A FINAL NEGATIVE REPORT     Performed at [facility]   Report Status PENDING   Incomplete  URINE CULTURE     Status: None   Collection Time    06/14/13 11:54 AM      Result Value Range Status   Specimen Description URINE, CLEAN CATCH   Final   Special Requests NONE   Final   Culture  Setup Time     Final   Value: 06/14/2013 20:04     Performed at [facility] Count     Final   Value: 40,000 COLONIES/ML     Performed at [facility]   Culture     Final   Value: Multiple bacterial morphotypes present, none predominant. Suggest appropriate recollection if clinically indicated.     Performed at [facility]   Report Status 06/15/2013 FINAL   Final  CLOSTRIDIUM DIFFICILE BY PCR     Status: Abnormal   Collection Time  06/14/13  5:16 PM      Result Value Range Status   C difficile by pcr POSITIVE (*) NEGATIVE Final   Comment: CRITICAL RESULT CALLED TO, READ BACK BY AND VERIFIED WITH:     HUDSON,L RN 06/14/13 2012 WOOTEN,K     Performed at [facility]  MRSA PCR SCREENING     Status: Abnormal   Collection Time    06/15/13  6:57 AM      Result Value Range Status   MRSA by PCR  POSITIVE (*) NEGATIVE Final   Comment:            The GeneXpert MRSA Assay (FDA     approved for NASAL specimens     only), is one component of a     comprehensive MRSA colonization     surveillance program. It is not     intended to diagnose MRSA     infection nor to guide or     monitor treatment for     MRSA infections.     RESULT CALLED TO, READ BACK BY AND VERIFIED WITH:     K CHEEK AT 0944 ON 01.04.2015 BY NBROOKS     Scheduled Meds: . feeding supplement (GLUCERNA SHAKE)  237 mL Oral Q24H  . finasteride  5 mg Oral [id number]  . furosemide  20 mg Oral Q24H  . insulin aspart  0-9 Units Subcutaneous TID WC  . mirtazapine  15 mg Oral QHS  . sodium chloride  3 mL Intravenous Q12H  . tamsulosin  0.4 mg Oral QPC supper  . vancomycin  125 mg Oral Q6H   Continuous Infusions: . sodium chloride 20 mL/hr at 06/16/13 1632     Jackquline Branca, DO  [facility] Pager [phone]  If 7PM-7AM, please contact night-coverage [email or web address] Password [email or web address] 06/18/2013, 4:24 PM   LOS: 4 days

## 2013-06-18 NOTE — Progress Notes (Signed)
Occupational Therapy Treatment Patient Details Name: DECATUR SEGREST MRN: [id number] DOB: 1911/05/24 Today's Date: 06/18/2013 Time: [id number] OT Time Calculation (min): 23 min  OT Assessment / Plan / Recommendation  History of present illness Pt is a 80 yr old WM admitted for sheptic shock from c diff.  pt has been in and out of the hospital since dec 1st and on several antiobiotics.  Now, recovering from septic shock.  pt with CKD stage 3 and has pulmonary edema.   OT comments  Pt did well!  Follow Up Recommendations  Home health OT;SNF;Other (comment);Supervision/Assistance - 24 hour (depending on family ability to help)       Equipment Recommendations  None recommended by OT          Progress towards OT Goals Progress towards OT goals: Progressing toward goals  Plan Discharge plan remains appropriate    Precautions / Restrictions Precautions Precaution Comments: Pt with significant posterior lean when standing. monitor sats Restrictions Weight Bearing Restrictions: No       ADL  Grooming: Set up Toilet Transfer: Minimal assistance Toilet Transfer Method: Sit to stand;Stand pivot Toileting - Clothing Manipulation and Hygiene: Minimal assistance Where Assessed - Toileting Clothing Manipulation and Hygiene: Standing ADL Comments: Pt with increased standing balance this OT visit.  Pt is receiving a congressional gold medal today in which family is going to accept award and then will bring it to him.  Pt very willing to work with OT.  Pt is dissapointed that he cant go but is still in good spirits.       OT Goals(current goals can now be found in the care plan section) Acute Rehab OT Goals Patient Stated Goal: to go back to my house OT Goal Formulation: With patient  Visit Information  Last OT Received On: 06/18/13 History of Present Illness: Pt is a 80 yr old WM admitted for sheptic shock from c diff.  pt has been in and out of the hospital since dec 1st and on several  antiobiotics.  Now, recovering from septic shock.  pt with CKD stage 3 and has pulmonary edema.          Cognition  Cognition Arousal/Alertness: Awake/alert Behavior During Therapy: Impulsive Overall Cognitive Status: Within Functional Limits for tasks assessed             End of Session OT - End of Session Activity Tolerance: Patient tolerated treatment well Patient left: in chair;with call bell/phone within reach;with family/visitor present Nurse Communication: Mobility status  GO     Alba Cory 06/18/2013, 10:26 AM

## 2013-06-19 DIAGNOSIS — I2510 Atherosclerotic heart disease of native coronary artery without angina pectoris: Secondary | ICD-10-CM

## 2013-06-19 DIAGNOSIS — D649 Anemia, unspecified: Secondary | ICD-10-CM

## 2013-06-19 LAB — CBC WITH DIFFERENTIAL/PLATELET
BASOS PCT: 1 % (ref 0–1)
Basophils Absolute: 0.1 10*3/uL (ref 0.0–0.1)
EOS PCT: 3 % (ref 0–5)
Eosinophils Absolute: 0.2 10*3/uL (ref 0.0–0.7)
HCT: 34.3 % — ABNORMAL LOW (ref 39.0–52.0)
HEMOGLOBIN: 11.2 g/dL — AB (ref 13.0–17.0)
LYMPHS ABS: 1.4 10*3/uL (ref 0.7–4.0)
Lymphocytes Relative: 20 % (ref 12–46)
MCH: 31.8 pg (ref 26.0–34.0)
MCHC: 32.7 g/dL (ref 30.0–36.0)
MCV: 97.4 fL (ref 78.0–100.0)
MONO ABS: 0.9 10*3/uL (ref 0.1–1.0)
MONOS PCT: 13 % — AB (ref 3–12)
Neutro Abs: 4.2 10*3/uL (ref 1.7–7.7)
Neutrophils Relative %: 63 % (ref 43–77)
PLATELETS: 109 10*3/uL — AB (ref 150–400)
RBC: 3.52 MIL/uL — AB (ref 4.22–5.81)
RDW: 15.2 % (ref 11.5–15.5)
WBC: 6.8 10*3/uL (ref 4.0–10.5)

## 2013-06-19 LAB — BASIC METABOLIC PANEL
BUN: 13 mg/dL (ref 6–23)
CHLORIDE: 109 meq/L (ref 96–112)
CO2: 22 meq/L (ref 19–32)
Calcium: 8 mg/dL — ABNORMAL LOW (ref 8.4–10.5)
Creatinine, Ser: 0.68 mg/dL (ref 0.50–1.35)
GFR calc Af Amer: 88 mL/min — ABNORMAL LOW (ref >90)
GFR calc non Af Amer: 76 mL/min — ABNORMAL LOW (ref >90)
GLUCOSE: 163 mg/dL — AB (ref 70–99)
Potassium: 3.1 mEq/L — ABNORMAL LOW (ref 3.7–5.3)
Sodium: 146 mEq/L (ref 137–147)

## 2013-06-19 LAB — GLUCOSE, CAPILLARY
GLUCOSE-CAPILLARY: 158 mg/dL — AB (ref 70–99)
GLUCOSE-CAPILLARY: 241 mg/dL — AB (ref 70–99)

## 2013-06-19 MED ORDER — FUROSEMIDE 20 MG PO TABS: 20.0000 mg | ORAL_TABLET | ORAL | Status: DC

## 2013-06-19 MED ORDER — GLUCERNA SHAKE PO LIQD: 237.0000 mL | ORAL | Status: DC

## 2013-06-19 MED ORDER — POTASSIUM CHLORIDE CRYS ER 20 MEQ PO TBCR
40.0000 meq | EXTENDED_RELEASE_TABLET | Freq: Once | ORAL | Status: AC
  Administered 2013-06-19: 11:00:00 40 meq via ORAL
  Filled 2013-06-19: qty 2

## 2013-06-19 MED ORDER — FUROSEMIDE 40 MG PO TABS: 40.0000 mg | ORAL_TABLET | Freq: Every morning | ORAL | Status: DC

## 2013-06-19 MED ORDER — VANCOMYCIN 50 MG/ML ORAL SOLUTION: 125.0000 mg | Freq: Four times a day (QID) | ORAL | Status: DC

## 2013-06-19 MED ORDER — FUROSEMIDE 40 MG PO TABS
40.0000 mg | ORAL_TABLET | Freq: Every day | ORAL | Status: DC
  Administered 2013-06-19: 11:00:00 40 mg via ORAL
  Filled 2013-06-19: qty 1

## 2013-06-19 NOTE — Progress Notes (Signed)
Son transported patient to [facility] via car.   Unice Bailey, LCSW [facility] Clinical Social Worker cell #: [phone]

## 2013-06-19 NOTE — Progress Notes (Signed)
Report called to N. Barry Dienes LPN @ [facility].

## 2013-06-19 NOTE — Progress Notes (Signed)
Clinical Social Work Department BRIEF PSYCHOSOCIAL ASSESSMENT 06/19/2013  Patient:  David Davidson, David Davidson     Account Number:  [id number]     Admit date:  06/14/2013  Clinical Social Worker:  Orpah Greek  Date/Time:  06/19/2013 11:15 AM  Referred by:  Physician  Date Referred:  06/19/2013 Referred for  SNF Placement   Other Referral:   Interview type:  Patient Other interview type:   and son at bedside    PSYCHOSOCIAL DATA Living Status:  ALONE Admitted from facility:   Level of care:   Primary support name:  Robb Bobek II (son) h#: [phone] c#: [phone] c#: [phone] Primary support relationship to patient:  CHILD, ADULT Degree of support available:   good    CURRENT CONCERNS Current Concerns  Post-Acute Placement   Other Concerns:    SOCIAL WORK ASSESSMENT / PLAN CSW reviewed PT evaluation recommending SNF. Also received consult from RN that patient's son is interested in [facility] or [facility] for his rather.   Assessment/plan status:  Information/Referral to [facility] Other assessment/ plan:   Information/referral to community resources:   CSW completed FL2 and faxed information out to [facility] - confirmed with Jill Side @ [facility] that they would be able to take patient this afternoon.    PATIENT'S/FAMILY'S RESPONSE TO PLAN OF CARE: Patient/son were very pleased to hear that [facility] would be able to take patient today.       Unice Bailey, LCSW [facility] Clinical Social Worker cell #: [phone]

## 2013-06-19 NOTE — Discharge Summary (Signed)
Physician Discharge Summary  David Davidson KGM:[id number] DOB: 16-Nov-1910 DOA: 06/14/2013  PCP: [name], MD  Admit date: 06/14/2013 Discharge date: 06/19/2013  Recommendations for Outpatient Follow-up:  1. Pt will need to follow up with PCP in 2 weeks post discharge 2. Please obtain BMP to evaluate electrolytes and kidney function on 06/23/13 3. Please also check CBC to evaluate Hg and Hct levels   Discharge Diagnoses:  Principal Problem:   Septic shock Active Problems:   CAD (coronary artery disease)   Paroxysmal a-fib   DM (diabetes mellitus), type 2, uncontrolled with complications   Hypertension   Hypothyroidism   Recurrent colitis due to Clostridium difficile   Anemia   Thrombocytopenia   Fever   C. difficile diarrhea Septic shock due to C. Diff colitis.  Fever, lactic acidosis, and leukocytosis resolved. uop improving. BP more stable  - Repeat CXR and KUB: No evidence of pneumonia or toxic megacolon  - UCx grew few colonies of mixed flora  - BCx NGTD  - Continue oral vanc--taper plan noted as per ID  -Vancomycin 125mg  4 times daily for 10 additional days to complete 14 days of therapy -Then start taper with vancomycin 125 mg twice a day x14 days, then 125 mg every 48 hours x14 days. Acute hypoxic respiratory failure  -Currently stable on room air  - due to acute on chronic systolic heart failure from fluid resuscitation  - Carefully monitor BP--which has improved - Nasal canula as needed to maintain O2 saturations > 92% --> patient was weaned to room air - Restart furosemide 20 mg this evening  -Echocardiogram EF 35-40%-  Patient to restart furosemide 40 mg in the morning, 20 mg in evening- CAD/HTN/HLD,  -chest pain free.  - Patient not on ASA, BB, or statin for unclear reasons  - hold zetia and fish oil for now  Chronic systolic heart failure EF 35%  PAF,  -HR appears to be trending down  - D/c telemetry  - Will not try to reduce rate for now as may be  appropriate compensation for level of illness.  - Hold anticoagulation due to last months GIB and current illness  BPH, stable. Continue flomax  Hypothyroidism,  -stable. Continue levothyroxine.  DM, with mild hyperglycemia  - Hold glyburide while patient is in the hospital - Low dose SSI  Insomnia,  -continue ambien prn and mirtazapine  Leukocytosis,  - resolved with abx.  Chronic anemia and thrombocytopenia.  - Hemoglobin stable and platelets recovering. Acute phase reactants  - continues to improve - fibrinogen level normal and no schistocytes  CKD stage 3,  stable. Minimize nephrotoxins and renally dose medications.  Creatinine trending down and metabolic acidosis resolving -Serum creatinine 0.68 on the day of  Diet: Dysphagia 3    Discharge Condition: Stable  Disposition:  skilled nursing facility  Diet: Low residue/low fiber Wt Readings from Last 3 Encounters:  06/19/13 89.313 kg (196 lb 14.4 oz)  05/23/13 87.8 kg (193 lb 9 oz)  05/17/13 96.4 kg (212 lb 8.4 oz)    History of present illness:  80 y.o. year-old male with history of CAD s/p MI and CABG, chronic systolic heart failure with EF of 35%, severe aortic stenosis, paroxysmal atrial flutter, HTN, HLD, DM, CKD stage 3, recurrent C. Diff diarrhea who presents with fever and weakness. The patient was last at their baseline health in early December. He was hospitalized from December second through the sixth with GI bleed at which time his warfarin was discontinued. He  was readmitted a few days later and hospitalized from December 9 2 the 12th with healthcare associated pneumonia and sepsis and recurrent C. difficile colitis. He was discharged home with a two-week course of vancomycin which he completed. He started having once daily soft liquidy stools. He presented to the emergency department he presented to the emergency department on December 31 with progressive weakness and was given some IV fluids and sent home. Chest  x-ray was performed and is not having any respiratory symptoms. His urinalysis demonstrated large leukocyte esterase, too numerous to count white blood cells and many but no bacteria and urine culture grew 85,000 colonies of mixed flora. He did well for the subsequent 2 days and at the request of the emergency department physician he did not take his Lasix. He ate and felt well yesterday. This morning he woke up with chills. His son came to his house and gave him blankets inturned of the temperature, however the patient became so weak that he could barely lift his head from the bed. They brought him to the emergency department for further evaluation. The patient denies any sinus congestion, rhinorrhea, sore throat, cough, shortness of breath, nausea, vomiting, abdominal pain. He has not had a change in his diarrhea. He denies any change in his urinary habits or dysuria. He denies any skin ulcers or infections. He denies muscle aches, however he states he feels extremely fatigued and tired.  In the emergency department his temperature was 100.6 Fahrenheit, pulse in the low 100s, blood pressure 115/56, oxygen saturations 93% on room air. White blood cell count 11, hemoglobin 12.3, platelets 103. His BMP shows some improvement in the dehydration that he had a couple days ago. His troponin was negative. Chest x-ray demonstrated bronchitis and urinalysis was similar to that obtained several days ago. He was given some gentle fluids and Tylenol in the emergency department.    Consultants: Infectious disease  Discharge Exam: Filed Vitals:   06/19/13 0800  BP: 115/66  Pulse: 94  Temp: 98.4 F (36.9 C)  Resp: 20   Filed Vitals:   06/18/13 1309 06/18/13 2041 06/19/13 0500 06/19/13 0800  BP: 110/54 124/73 106/60 115/66  Pulse: 98 103 104 94  Temp: 97.5 F (36.4 C) 98.2 F (36.8 C) 97.7 F (36.5 C) 98.4 F (36.9 C)  TempSrc: Oral Oral Axillary Oral  Resp: 20 20 20 20   Height:      Weight:   89.313 kg  (196 lb 14.4 oz)   SpO2: 98% 97% 98% 95%   General: A&O x 3, NAD, pleasant, cooperative Cardiovascular: RRR, no rub, no gallop, no S3 Respiratory: Bibasilar crackles. No wheezing. Good air movement.  Abdomen:soft, nontender, nondistended, positive bowel sounds Extremities: No edema, No lymphangitis, no petechiae  Discharge Instructions      Discharge Orders   Future Orders Complete By Expires   Diet - low sodium heart healthy  As directed    Increase activity slowly  As directed        Medication List    STOP taking these medications       warfarin 5 MG tablet  Commonly known as:  COUMADIN      TAKE these medications       diphenhydramine-acetaminophen 25-500 MG Tabs  Commonly known as:  TYLENOL PM  Take 1 tablet by mouth at bedtime as needed (sleep).     ezetimibe 10 MG tablet  Commonly known as:  ZETIA  Take 10 mg by mouth at bedtime.  feeding supplement (GLUCERNA SHAKE) Liqd  Take 237 mLs by mouth daily.     finasteride 5 MG tablet  Commonly known as:  PROSCAR  Take 5 mg by mouth every morning.     fish oil-omega-3 fatty acids 1000 MG capsule  Take 1 g by mouth every morning.     furosemide 20 MG tablet  Commonly known as:  LASIX  Take 1 tablet (20 mg total) by mouth daily.     furosemide 40 MG tablet  Commonly known as:  LASIX  Take 1 tablet (40 mg total) by mouth every morning.     glyBURIDE 5 MG tablet  Commonly known as:  DIABETA  Take 2.5-5 mg by mouth daily as needed (high blood sugar).     ICAPS PO  Take 1 capsule by mouth daily.     levothyroxine 50 MCG tablet  Commonly known as:  SYNTHROID, LEVOTHROID  Take 50 mcg by mouth daily before breakfast.     LORazepam 0.5 MG tablet  Commonly known as:  ATIVAN  Take 0.5 mg by mouth at bedtime as needed. For insomnia.     mirtazapine 15 MG tablet  Commonly known as:  REMERON  Take 15 mg by mouth at bedtime.     multivitamin with minerals Tabs tablet  Take 1 tablet by mouth daily.      potassium chloride 10 MEQ tablet  Commonly known as:  K-DUR,KLOR-CON  Take 10 mEq by mouth 3 (three) times daily.     tamsulosin 0.4 MG Caps capsule  Commonly known as:  FLOMAX  Take 0.4 mg by mouth.     vancomycin 50 mg/mL oral solution  Commonly known as:  VANCOCIN  Take 2.5 mLs (125 mg total) by mouth every 6 (six) hours. 125mg  every 6 hours x 10 days followed by 125mg  every 12 hours x 14 days followed by 125mg  every 48 hours x 14 days     zolpidem 10 MG tablet  Commonly known as:  AMBIEN  Take 5 mg by mouth at bedtime as needed for sleep.         The results of significant diagnostics from this hospitalization (including imaging, microbiology, ancillary and laboratory) are listed below for reference.    Significant Diagnostic Studies: Dg Chest Port 1 View  06/16/2013   CLINICAL DATA:  History sepsis.  Abdominal pain.  Shortness breath.  EXAM: PORTABLE CHEST - 1 VIEW  COMPARISON:  06/14/2013 and 05/20/2013.  FINDINGS: Post CABG.  Cardiomegaly.  Pulmonary vascular congestion/mild pulmonary edema.  Elevated left hemidiaphragm. Left base atelectasis. Limited for excluding left base infiltrate.  Calcified tortuous aorta.  No gross pneumothorax.  IMPRESSION: Post CABG.  Cardiomegaly.  Pulmonary vascular congestion/mild pulmonary edema.  Elevated left hemidiaphragm. Left base atelectasis. Limited for excluding left base infiltrate.  Calcified tortuous aorta.   Electronically Signed   By: Bridgett Larsson M.D.   On: 06/16/2013 11:08   Dg Chest Port 1 View  06/14/2013   CLINICAL DATA:  Fever, weakness, body aches  EXAM: PORTABLE CHEST - 1 VIEW  COMPARISON:  None.  FINDINGS: Bilateral diffuse interstitial thickening and peribronchial cuffing most concerning for bronchitis. There is no pleural effusion or pneumothorax. Stable cardiomegaly. Prior CABG.  Degenerative changes of bilateral acromioclavicular joints.  IMPRESSION: Bilateral diffuse interstitial thickening and peribronchial cuffing most  concerning for bronchitis.   Electronically Signed   By: Elige Ko   On: 06/14/2013 11:37   Dg Abd Portable 1v  06/16/2013   CLINICAL DATA:  Abdominal pain.  EXAM: PORTABLE ABDOMEN - 1 VIEW  COMPARISON:  08/16/2011.  FINDINGS: Ileus appearing bowel gas pattern without findings for obstruction or perforation. The soft tissue shadows are maintained. The bony structures are intact.  IMPRESSION: Probable mild diffuse ileus.   Electronically Signed   By: Loralie Champagne M.D.   On: 06/16/2013 11:33     Microbiology: Recent Results (from the past 240 hour(s))  URINE CULTURE     Status: None   Collection Time    06/11/13 10:47 PM      Result Value Range Status   Specimen Description URINE, CLEAN CATCH   Final   Special Requests NONE   Final   Culture  Setup Time     Final   Value: 06/12/2013 05:11     Performed at [facility] Count     Final   Value: 75,000 COLONIES/ML     Performed at [facility]   Culture     Final   Value: Multiple bacterial morphotypes present, none predominant. Suggest appropriate recollection if clinically indicated.     Performed at [facility]   Report Status 06/13/2013 FINAL   Final  CULTURE, BLOOD (ROUTINE X 2)     Status: None   Collection Time    06/14/13 10:35 AM      Result Value Range Status   Specimen Description BLOOD LEFT ARM   Final   Special Requests BOTTLES DRAWN AEROBIC AND ANAEROBIC 5CC EACH   Final   Culture  Setup Time     Final   Value: 06/14/2013 18:10     Performed at [facility]   Culture     Final   Value:        BLOOD CULTURE RECEIVED NO GROWTH TO DATE CULTURE WILL BE HELD FOR 5 DAYS BEFORE ISSUING A FINAL NEGATIVE REPORT     Performed at [facility]   Report Status PENDING   Incomplete  CULTURE, BLOOD (ROUTINE X 2)     Status: None   Collection Time    06/14/13 11:30 AM      Result Value Range Status   Specimen Description BLOOD LEFT HAND  5 ML IN [facility] BOTTLE   Final   Special  Requests NONE   Final   Culture  Setup Time     Final   Value: 06/14/2013 18:10     Performed at [facility]   Culture     Final   Value:        BLOOD CULTURE RECEIVED NO GROWTH TO DATE CULTURE WILL BE HELD FOR 5 DAYS BEFORE ISSUING A FINAL NEGATIVE REPORT     Performed at [facility]   Report Status PENDING   Incomplete  URINE CULTURE     Status: None   Collection Time    06/14/13 11:54 AM      Result Value Range Status   Specimen Description URINE, CLEAN CATCH   Final   Special Requests NONE   Final   Culture  Setup Time     Final   Value: 06/14/2013 20:04     Performed at [facility] Count     Final   Value: 40,000 COLONIES/ML     Performed at [facility]   Culture     Final   Value: Multiple bacterial morphotypes present, none predominant. Suggest appropriate recollection if clinically indicated.     Performed at  [facility]   Report Status 06/15/2013 FINAL   Final  CLOSTRIDIUM DIFFICILE BY PCR     Status: Abnormal   Collection Time    06/14/13  5:16 PM      Result Value Range Status   C difficile by pcr POSITIVE (*) NEGATIVE Final   Comment: CRITICAL RESULT CALLED TO, READ BACK BY AND VERIFIED WITH:     HUDSON,L RN 06/14/13 2012 WOOTEN,K     Performed at [facility]  MRSA PCR SCREENING     Status: Abnormal   Collection Time    06/15/13  6:57 AM      Result Value Range Status   MRSA by PCR POSITIVE (*) NEGATIVE Final   Comment:            The GeneXpert MRSA Assay (FDA     approved for NASAL specimens     only), is one component of a     comprehensive MRSA colonization     surveillance program. It is not     intended to diagnose MRSA     infection nor to guide or     monitor treatment for     MRSA infections.     RESULT CALLED TO, READ BACK BY AND VERIFIED WITH:     K CHEEK AT 0944 ON 01.04.2015 BY NBROOKS     Labs: Basic Metabolic Panel:  Recent Labs Lab 06/15/13 1619 06/16/13 0705 06/17/13 0445  06/18/13 0433 06/19/13 0430  NA 144 146 144 142 146  K 3.8 3.6* 3.6* 3.2* 3.1*  CL 112 114* 113* 109 109  CO2 19 20 20 22 22   GLUCOSE 162* 154* 134* 134* 163*  BUN 26* 26* 24* 18 13  CREATININE 1.17 1.07 0.86 0.76 0.68  CALCIUM 7.8* 7.5* 7.7* 8.0* 8.0*  MG  --   --  2.1  --   --    Liver Function Tests:  Recent Labs Lab 06/14/13 1150 06/15/13 1619 06/16/13 0705  AST 31 13 10   ALT 11 7 6   ALKPHOS 56 43 40  BILITOT 0.8 0.4 0.2*  PROT 7.4 6.0 5.7*  ALBUMIN 3.3* 2.4* 2.3*   No results found for this basename: LIPASE, AMYLASE,  in the last 168 hours No results found for this basename: AMMONIA,  in the last 168 hours CBC:  Recent Labs Lab 06/14/13 1150  06/15/13 1619 06/16/13 0705 06/17/13 0445 06/18/13 0433 06/19/13 0430  WBC 11.0*  < > 10.0 9.0 8.6 7.6 6.8  NEUTROABS 9.1*  --   --  6.4 6.0 4.9 4.2  HGB 12.3*  < > 11.2* 10.5* 10.2* 11.1* 11.2*  HCT 37.3*  < > 33.8* 32.2* 30.7* 33.4* 34.3*  MCV 98.9  < > 98.8 100.0 98.7 98.5 97.4  PLT 103*  < > 106* 81* 97* 103* 109*  < > = values in this interval not displayed. Cardiac Enzymes:  Recent Labs Lab 06/14/13 1150  TROPONINI <0.30   BNP: No components found with this basename: POCBNP,  CBG:  Recent Labs Lab 06/18/13 0740 06/18/13 1135 06/18/13 1727 06/18/13 2038 06/19/13 0733  GLUCAP 149* 192* 163* 180* 158*    Time coordinating discharge:  Greater than 30 minutes  Signed:  Achsah Mcquade, DO [facility] Pager: [phone] 06/19/2013, 10:11 AM

## 2013-06-19 NOTE — Progress Notes (Signed)
Patient is set to discharge to [facility] today. Patient & son at bedside aware. Discharge packet in [place]. Son is considering possibly transporting him by car this afternoon, son will let CSW know of plan once he has made a decision.   Clinical Social Work Department CLINICAL SOCIAL WORK PLACEMENT NOTE 06/19/2013  Patient:  David Davidson, David Davidson  Account Number:  [id number] Admit date:  06/14/2013  Clinical Social Worker:  Orpah Greek  Date/time:  06/19/2013 11:19 AM  Clinical Social Work is seeking post-discharge placement for this patient at the following level of care:   SKILLED NURSING   (*CSW will update this form in Epic as items are completed)   06/19/2013  Patient/family provided with [facility] Department of Clinical Social Work's list of facilities offering this level of care within the geographic area requested by the patient (or if unable, by the patient's family).  06/19/2013  Patient/family informed of their freedom to choose among providers that offer the needed level of care, that participate in Medicare, Medicaid or managed care program needed by the patient, have an available bed and are willing to accept the patient.  06/19/2013  Patient/family informed of [facility]' ownership interest in [facility], as well as of the fact that they are under no obligation to receive care at this facility.  PASARR submitted to EDS on 06/19/2013 PASARR number received from EDS on 06/19/2013  FL2 transmitted to all facilities in geographic area requested by pt/family on  06/19/2013 FL2 transmitted to all facilities within larger geographic area on   Patient informed that his/her managed care company has contracts with or will negotiate with  certain facilities, including the following:     Patient/family informed of bed offers received:  06/19/2013 Patient chooses bed at [facility] [place] Physician recommends and patient  chooses bed at    [facility] on  06/19/2013 Patient to be transferred to facility by [facility]  The following physician request were entered in Epic:   Additional Comments:   Unice Bailey, LCSW [facility] Clinical Social Worker cell #: [phone]

## 2013-06-20 LAB — CULTURE, BLOOD (ROUTINE X 2)
Culture: NO GROWTH
Culture: NO GROWTH

## 2013-07-22 ENCOUNTER — Encounter: Payer: Self-pay | Admitting: Internal Medicine

## 2013-07-22 ENCOUNTER — Ambulatory Visit (INDEPENDENT_AMBULATORY_CARE_PROVIDER_SITE_OTHER): Payer: Medicare Other | Admitting: Internal Medicine

## 2013-07-22 VITALS — BP 109/73 | HR 114 | Temp 97.2°F | Wt 181.0 lb

## 2013-07-22 DIAGNOSIS — A0472 Enterocolitis due to Clostridium difficile, not specified as recurrent: Secondary | ICD-10-CM

## 2013-07-22 DIAGNOSIS — R634 Abnormal weight loss: Secondary | ICD-10-CM

## 2013-07-22 DIAGNOSIS — G4701 Insomnia due to medical condition: Secondary | ICD-10-CM

## 2013-07-22 MED ORDER — MIRTAZAPINE 15 MG PO TABS: 15.0000 mg | ORAL_TABLET | Freq: Every day | ORAL | Status: DC

## 2013-07-22 NOTE — Progress Notes (Signed)
Subjective:    Patient ID: David Davidson, male    DOB: [birth year], 80 y.o.   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 This past late Fall and winter, he was hospitalized for bronchitis/cellulitis and had recurrence of cdiff after introduction of antibiotics. He was rehospitalized in early January with sepsis due to recurrent cdifficile. He was initially treated with high dose vanco and IV metronidazole. Discharged to snf on vanco po 125mg  QID which he currently takes in addition to florastor. He reports now having formed stools once a day. His appetite is still poor and is roughly 12 pounds underweight from his baseline. He states that he know lives back close to his son who administers his meds. He still has bad insomnia with vivid dreams of being back in the [employer].  Current Outpatient Prescriptions on File Prior to Visit  Medication Sig Dispense Refill  . diphenhydramine-acetaminophen (TYLENOL PM) 25-500 MG TABS Take 1 tablet by mouth at bedtime as needed (sleep).      . finasteride (PROSCAR) 5 MG tablet Take 5 mg by mouth every morning.       . fish oil-omega-3 fatty acids 1000 MG capsule Take 1 g by mouth every morning.       . glyBURIDE (DIABETA) 5 MG tablet Take 2.5-5 mg by mouth daily as needed (high blood sugar).       Marland Kitchen levothyroxine (SYNTHROID, LEVOTHROID) 50 MCG tablet Take 50 mcg by mouth daily before breakfast.      . LORazepam (ATIVAN) 0.5 MG tablet Take 0.5 mg by mouth at bedtime as needed. For insomnia.      . Multiple Vitamin (MULTIVITAMIN WITH MINERALS) TABS Take 1 tablet by mouth daily.      . potassium chloride (K-DUR,KLOR-CON) 10 MEQ tablet Take 10 mEq by mouth 3 (three) times daily.      . tamsulosin (FLOMAX) 0.4 MG CAPS capsule Take 0.4 mg by mouth.      . vancomycin (VANCOCIN) 50 mg/mL oral solution Take 2.5 mLs (125  mg total) by mouth every 6 (six) hours. 125mg  every 6 hours x 10 days followed by 125mg  every 12 hours x 14 days followed by 125mg  every 48 hours x 14 days  200 mL  0  . ezetimibe (ZETIA) 10 MG tablet Take 10 mg by mouth at bedtime.       . feeding supplement, GLUCERNA SHAKE, (GLUCERNA SHAKE) LIQD Take 237 mLs by mouth daily.    0  . furosemide (LASIX) 20 MG tablet Take 1 tablet (20 mg total) by mouth daily.  30 tablet  0  . furosemide (LASIX) 40 MG tablet Take 1 tablet (40 mg total) by mouth every morning.  30 tablet  0   No current facility-administered medications on file prior to visit.   Active Ambulatory Problems    Diagnosis Date Noted  . CAD (coronary artery disease) 05/09/2011  . Paroxysmal a-fib 05/09/2011  . DM (diabetes mellitus), type 2, uncontrolled with complications 05/09/2011  . Hypertension 05/09/2011  . S/P CABG (coronary artery bypass graft) 05/09/2011  . Hyperlipidemia 05/09/2011  . Hypothyroidism 05/29/2011  . Recurrent colitis due to Clostridium difficile 06/30/2011  . Anemia 08/16/2011  . Septic shock 09/08/2011  . Thrombocytopenia 09/08/2011  . Acute on chronic combined systolic and diastolic heart failure 12/24/2012  . Cellulitis 05/13/2013  .  Melena 05/13/2013  . NSTEMI (non-ST elevated myocardial infarction) 05/14/2013  . HCAP (healthcare-associated pneumonia) 05/20/2013  . Fever 06/14/2013  . C. difficile diarrhea 06/15/2013   Resolved Ambulatory Problems    Diagnosis Date Noted  . Community acquired bacterial pneumonia 05/09/2011  . Clostridium difficile colitis 05/29/2011  . Hypokalemia 05/29/2011  . Leukocytosis 05/29/2011  . UTI (lower urinary tract infection) 05/29/2011  . Diarrhea 06/25/2011  . Weakness generalized 06/25/2011  . Dehydration 06/25/2011  . Hypokalemia 08/16/2011  . Leukocytosis 08/16/2011  . Acute on chronic diastolic CHF (congestive heart failure) 08/20/2012  . DM2 (diabetes mellitus, type 2) 08/20/2012  . Systolic CHF,  acute 08/23/2012   Past Medical History  Diagnosis Date  . Myocardial infarct   . Diabetes mellitus   . CHF (congestive heart failure)   . Coronary artery disease   . Clostridium difficile diarrhea   . Pneumonia 04/2011  . Shortness of breath   . Chronic kidney disease   . Dysrhythmia   . Paroxysmal atrial fibrillation   . DNR (do not resuscitate)      Review of Systems 12 point ros is negative except for poor appetite, weight loss, and insomnia    Objective:   Physical Exam BP 109/73  Pulse 114  Temp(Src) 97.2 F (36.2 C) (Oral)  Wt 181 lb (82.101 kg) Physical Exam  Constitutional: He is oriented to person, place, and time. He appears well-developed and well-nourished. No distress.  HENT:  Mouth/Throat: Oropharynx is clear and moist. No oropharyngeal exudate.  Cardiovascular: Normal rate, regular rhythm and normal heart sounds. Exam reveals no gallop and no friction rub.  No murmur heard.  Pulmonary/Chest: Effort normal and breath sounds normal. No respiratory distress. He has no wheezes.  Abdominal: Soft. Bowel sounds are normal. He exhibits no distension. There is no tenderness.  Lymphadenopathy:  He has no cervical adenopathy.  Neurological: He is alert and oriented to person, place, and time.  Skin: Skin is warm and dry. No rash noted. No erythema.  Psychiatric: He has a normal mood and affect. His behavior is normal.       Assessment & Plan:  Recurrent cdifficile = will start to taper. Will decrease to 125mg  TID x 2 wk then down to bid until we see him  Weight loss and loss of appetite= will reintroduce remeron to see if it will help his symptoms  Insomnia = would avoaid using ambien in someoe who is old. Will have them continue with ativan 1gm qhs prn.  rtc in 3-4wk

## 2013-07-25 ENCOUNTER — Other Ambulatory Visit: Payer: Self-pay | Admitting: *Deleted

## 2013-07-25 ENCOUNTER — Telehealth: Payer: Self-pay | Admitting: *Deleted

## 2013-07-25 DIAGNOSIS — A0472 Enterocolitis due to Clostridium difficile, not specified as recurrent: Secondary | ICD-10-CM

## 2013-07-25 MED ORDER — VANCOMYCIN 50 MG/ML ORAL SOLUTION: 125.0000 mg | Freq: Three times a day (TID) | ORAL | Status: DC

## 2013-07-25 NOTE — Telephone Encounter (Signed)
Call from patient's son, David Davidson, his insurance changed and is denying the oral vancomycin. The medication is also on back order, but [facility] pharmacy was able to get it from another pharmacy and the family will pay $120 out of pocket for a one month supply. The pharmacist said the new plan is only paying for medications at 30%. The cost for capsules is $309 per month.  David Davidson

## 2013-07-29 NOTE — Telephone Encounter (Signed)
The liquid oral vancomycin is cheaper than the pills. We can try to find out if they can get it from [facility] outpatient pharmacy for less

## 2013-07-31 NOTE — Telephone Encounter (Signed)
Patient already paid for the liquid vancomycin. Will he need to be on it longer than 1 month? It seems to be an issue with the change in insurance.

## 2013-08-04 ENCOUNTER — Encounter: Payer: Self-pay | Admitting: Internal Medicine

## 2013-08-04 ENCOUNTER — Ambulatory Visit (INDEPENDENT_AMBULATORY_CARE_PROVIDER_SITE_OTHER): Payer: Medicare Other | Admitting: Internal Medicine

## 2013-08-04 VITALS — BP 107/70 | HR 116

## 2013-08-04 DIAGNOSIS — A0472 Enterocolitis due to Clostridium difficile, not specified as recurrent: Secondary | ICD-10-CM

## 2013-08-04 DIAGNOSIS — R300 Dysuria: Secondary | ICD-10-CM

## 2013-08-04 NOTE — Progress Notes (Signed)
Subjective:    Patient ID: David Davidson, male    DOB: 11-Dec-1910, 80 y.o.   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 This past late Fall and winter, he was hospitalized for bronchitis/cellulitis and had recurrence of cdiff after introduction of antibiotics. He was rehospitalized in early January with sepsis due to recurrent cdifficile. He was initially treated with high dose vanco and IV metronidazole. Discharged to snf on vanco po 125mg  QID which he currently takes in addition to florastor. He is tapered to 125mg  TID and having formed stools. He still reports poor appetite from the oral vancomycin but appears to be improving. No significant weight gain since being discharged from SNF/hospital. He has noticed some urinary discomfort torward the end of his urinary stream which has just started in the last 5 days. No fever or chills. No blood in his urine.  Current Outpatient Prescriptions on File Prior to Visit  Medication Sig Dispense Refill  . atorvastatin (LIPITOR) 10 MG tablet Take 10 mg by mouth daily.      . diphenhydramine-acetaminophen (TYLENOL PM) 25-500 MG TABS Take 1 tablet by mouth at bedtime as needed (sleep).      . ezetimibe (ZETIA) 10 MG tablet Take 10 mg by mouth at bedtime.       . feeding supplement, GLUCERNA SHAKE, (GLUCERNA SHAKE) LIQD Take 237 mLs by mouth daily.    0  . finasteride (PROSCAR) 5 MG tablet Take 5 mg by mouth every morning.       . fish oil-omega-3 fatty acids 1000 MG capsule Take 1 g by mouth every morning.       . furosemide (LASIX) 20 MG tablet Take 1 tablet (20 mg total) by mouth daily.  30 tablet  0  . furosemide (LASIX) 40 MG tablet Take 1 tablet (40 mg total) by mouth every morning.  30 tablet  0  . glipiZIDE (GLUCOTROL XL) 10 MG 24 hr tablet Take 10 mg by mouth daily with breakfast.      .  glyBURIDE (DIABETA) 5 MG tablet Take 2.5-5 mg by mouth daily as needed (high blood sugar).       Marland Kitchen levothyroxine (SYNTHROID, LEVOTHROID) 50 MCG tablet Take 50 mcg by mouth daily before breakfast.      . LORazepam (ATIVAN) 0.5 MG tablet Take 0.5 mg by mouth at bedtime as needed. For insomnia.      . mirtazapine (REMERON) 15 MG tablet Take 1 tablet (15 mg total) by mouth at bedtime.  30 tablet  11  . Multiple Vitamin (MULTIVITAMIN WITH MINERALS) TABS Take 1 tablet by mouth daily.      . potassium chloride (K-DUR,KLOR-CON) 10 MEQ tablet Take 10 mEq by mouth 3 (three) times daily.      . tamsulosin (FLOMAX) 0.4 MG CAPS capsule Take 0.4 mg by mouth.      . vancomycin (VANCOCIN) 50 mg/mL oral solution Take 2.5 mLs (125 mg total) by mouth 3 (three) times daily.  200 mL  0   No current facility-administered medications on file prior to visit.   Active Ambulatory Problems    Diagnosis Date Noted  . CAD (coronary artery disease) 05/09/2011  . Paroxysmal a-fib 05/09/2011  . DM (diabetes mellitus), type 2, uncontrolled with complications 05/09/2011  . Hypertension 05/09/2011  . S/P CABG (coronary artery bypass  graft) 05/09/2011  . Hyperlipidemia 05/09/2011  . Hypothyroidism 05/29/2011  . Recurrent colitis due to Clostridium difficile 06/30/2011  . Anemia 08/16/2011  . Septic shock 09/08/2011  . Thrombocytopenia 09/08/2011  . Acute on chronic combined systolic and diastolic heart failure 12/24/2012  . Cellulitis 05/13/2013  . Melena 05/13/2013  . NSTEMI (non-ST elevated myocardial infarction) 05/14/2013  . HCAP (healthcare-associated pneumonia) 05/20/2013  . Fever 06/14/2013  . C. difficile diarrhea 06/15/2013   Resolved Ambulatory Problems    Diagnosis Date Noted  . Community acquired bacterial pneumonia 05/09/2011  . Clostridium difficile colitis 05/29/2011  . Hypokalemia 05/29/2011  . Leukocytosis 05/29/2011  . UTI (lower urinary tract infection) 05/29/2011  . Diarrhea 06/25/2011  .  Weakness generalized 06/25/2011  . Dehydration 06/25/2011  . Hypokalemia 08/16/2011  . Leukocytosis 08/16/2011  . Acute on chronic diastolic CHF (congestive heart failure) 08/20/2012  . DM2 (diabetes mellitus, type 2) 08/20/2012  . Systolic CHF, acute 08/23/2012   Past Medical History  Diagnosis Date  . Myocardial infarct   . Diabetes mellitus   . CHF (congestive heart failure)   . Coronary artery disease   . Clostridium difficile diarrhea   . Pneumonia 04/2011  . Shortness of breath   . Chronic kidney disease   . Dysrhythmia   . Paroxysmal atrial fibrillation   . DNR (do not resuscitate)    Soc and fmaily hx unchanged since last visit   Review of Systems .12 point ROS is negative except what is mentioned in hpi    Objective:   Physical Exam BP 107/70  Pulse 116 gen = a x o by 3 in NAD. Appears stated age. Pale appearing in wheelchair HEENT = no thrush Cors= irreg, irreg, tachy pulm = CTAB abd = scaphoid, NTND, bs+      Assessment & Plan:  cdiff = now contiue to taper to bid for 7 days, then 1 daily  Dysuria =will check ua, microscopy and urine cx to decide if need to treat. May consider using fosfomycin as a one time dose. Await urine cx results.  rtc in 3 wk

## 2013-08-05 LAB — URINALYSIS, ROUTINE W REFLEX MICROSCOPIC
BILIRUBIN URINE: NEGATIVE
Glucose, UA: NEGATIVE mg/dL
Ketones, ur: NEGATIVE mg/dL
NITRITE: NEGATIVE
PROTEIN: 100 mg/dL — AB
Specific Gravity, Urine: 1.018 (ref 1.005–1.030)
UROBILINOGEN UA: 0.2 mg/dL (ref 0.0–1.0)
pH: 5.5 (ref 5.0–8.0)

## 2013-08-05 LAB — URINALYSIS, MICROSCOPIC ONLY
Bacteria, UA: NONE SEEN
CASTS: NONE SEEN
Crystals: NONE SEEN
Squamous Epithelial / LPF: NONE SEEN

## 2013-08-05 LAB — URINE CULTURE
Colony Count: NO GROWTH
Organism ID, Bacteria: NO GROWTH

## 2013-08-06 ENCOUNTER — Telehealth: Payer: Self-pay | Admitting: Internal Medicine

## 2013-08-06 NOTE — Telephone Encounter (Signed)
Gave results that no bacteria found on urine culture. So no need for antibiotics at this time. He reports having intermittent symptoms of dysuria. i mentioned to him if he felt that was occurring more consistently or more pain, blood in urine or fevers, to call us or pcp.

## 2013-08-18 ENCOUNTER — Ambulatory Visit: Payer: Medicare Other | Admitting: Internal Medicine

## 2013-08-19 ENCOUNTER — Telehealth: Payer: Self-pay | Admitting: *Deleted

## 2013-08-19 DIAGNOSIS — A0472 Enterocolitis due to Clostridium difficile, not specified as recurrent: Secondary | ICD-10-CM

## 2013-08-19 MED ORDER — VANCOMYCIN 50 MG/ML ORAL SOLUTION: 125.0000 mg | Freq: Four times a day (QID) | ORAL | Status: DC

## 2013-08-19 NOTE — Telephone Encounter (Signed)
Patient's son called, stating that his father finished the vancomycin Friday 3/6 but the diarrhea has returned.  RN advised patient's son to pick up a stool kit at the office today, gave patient appointment for 8:45 with Dr. Baxter Flattery.    Restart oral vancomycin 133ml 4 times daily per Dr. Baxter Flattery.  Phoned in to [facility] they cannot fill it at this time.  The liquid vancomycin is on back order, will have to be substitute capsules - the price will be increased ($206.52 for 56 capsules - 2 week supply, last fill was $125.) [facility] is able to fill this prescription.  Sent Rx for Dr. Baxter Flattery to [facility].  Pt's son has oral vancomycin left over, started his father on that last night.   Landis Gandy, RN

## 2013-08-20 ENCOUNTER — Telehealth: Payer: Self-pay | Admitting: Internal Medicine

## 2013-08-20 ENCOUNTER — Ambulatory Visit (INDEPENDENT_AMBULATORY_CARE_PROVIDER_SITE_OTHER): Payer: Medicare Other | Admitting: Internal Medicine

## 2013-08-20 VITALS — BP 99/69 | HR 106 | Temp 97.1°F

## 2013-08-20 DIAGNOSIS — A0472 Enterocolitis due to Clostridium difficile, not specified as recurrent: Secondary | ICD-10-CM

## 2013-08-20 DIAGNOSIS — E441 Mild protein-calorie malnutrition: Secondary | ICD-10-CM

## 2013-08-20 LAB — CBC WITH DIFFERENTIAL/PLATELET
BASOS ABS: 0 10*3/uL (ref 0.0–0.1)
Basophils Relative: 0 % (ref 0–1)
Eosinophils Absolute: 0.1 10*3/uL (ref 0.0–0.7)
Eosinophils Relative: 2 % (ref 0–5)
HCT: 37.2 % — ABNORMAL LOW (ref 39.0–52.0)
Hemoglobin: 12.2 g/dL — ABNORMAL LOW (ref 13.0–17.0)
Lymphocytes Relative: 27 % (ref 12–46)
Lymphs Abs: 1.6 10*3/uL (ref 0.7–4.0)
MCH: 31.5 pg (ref 26.0–34.0)
MCHC: 32.8 g/dL (ref 30.0–36.0)
MCV: 96.1 fL (ref 78.0–100.0)
Monocytes Absolute: 0.9 10*3/uL (ref 0.1–1.0)
Monocytes Relative: 15 % — ABNORMAL HIGH (ref 3–12)
NEUTROS ABS: 3.2 10*3/uL (ref 1.7–7.7)
Neutrophils Relative %: 56 % (ref 43–77)
PLATELETS: 114 10*3/uL — AB (ref 150–400)
RBC: 3.87 MIL/uL — ABNORMAL LOW (ref 4.22–5.81)
RDW: 15.2 % (ref 11.5–15.5)
WBC: 5.8 10*3/uL (ref 4.0–10.5)

## 2013-08-20 LAB — BASIC METABOLIC PANEL WITH GFR
BUN: 21 mg/dL (ref 6–23)
CO2: 24 mEq/L (ref 19–32)
CREATININE: 1.02 mg/dL (ref 0.50–1.35)
Calcium: 8.9 mg/dL (ref 8.4–10.5)
Chloride: 106 mEq/L (ref 96–112)
GFR, EST AFRICAN AMERICAN: 69 mL/min
GFR, EST NON AFRICAN AMERICAN: 60 mL/min
Glucose, Bld: 237 mg/dL — ABNORMAL HIGH (ref 70–99)
POTASSIUM: 4.5 meq/L (ref 3.5–5.3)
Sodium: 140 mEq/L (ref 135–145)

## 2013-08-20 NOTE — Telephone Encounter (Signed)
It would be best for me to see him to determine ability to tolerate a colonoscopy

## 2013-08-20 NOTE — Progress Notes (Signed)
Subjective:    Patient ID: David Davidson, male    DOB: 05/19/1911, 80 y.o.   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 This past Fall 2014 and winter, he was hospitalized for bronchitis/cellulitis and had recurrence of cdiff after exposure to antibiotics. He was rehospitalized in early January with sepsis due to recurrent cdifficile. He was initially treated with high dose vanco and IV metronidazole. Discharged to snf on vanco po 125mg  QID with florastor and was seen in ID clinic on 2/23 to commence tapered to 125mg  TID and having formed stools. He still reports poor appetite from the oral vancomycin but appears to be improving.   Had been doing a vanco taper but started to have increasing bowel movements when he decreased from twice to once a day, and most prominent when he stopped the taper. He has 3-5 bm of watery stool with abdominal cramping. Loss of appetite. Started back on vanco 125 qid yesterday.   He is in clinic with his son and daughter. She was his previous stool donor but reports that she had recently took 4 days of doxycycline last month.  Current Outpatient Prescriptions on File Prior to Visit  Medication Sig Dispense Refill  . atorvastatin (LIPITOR) 10 MG tablet Take 10 mg by mouth daily.      . diphenhydramine-acetaminophen (TYLENOL PM) 25-500 MG TABS Take 1 tablet by mouth at bedtime as needed (sleep).      . ezetimibe (ZETIA) 10 MG tablet Take 10 mg by mouth at bedtime.       . feeding supplement, GLUCERNA SHAKE, (GLUCERNA SHAKE) LIQD Take 237 mLs by mouth daily.    0  . finasteride (PROSCAR) 5 MG tablet Take 5 mg by mouth every morning.       . fish oil-omega-3 fatty acids 1000 MG capsule Take 1 g by mouth every morning.       . furosemide (LASIX) 20 MG tablet Take 1 tablet (20 mg total) by mouth daily.  30 tablet  0    . furosemide (LASIX) 40 MG tablet Take 1 tablet (40 mg total) by mouth every morning.  30 tablet  0  . glipiZIDE (GLUCOTROL XL) 10 MG 24 hr tablet Take 10 mg by mouth daily with breakfast.      . glyBURIDE (DIABETA) 5 MG tablet Take 2.5-5 mg by mouth daily as needed (high blood sugar).       Marland Kitchen. levothyroxine (SYNTHROID, LEVOTHROID) 50 MCG tablet Take 50 mcg by mouth daily before breakfast.      . LORazepam (ATIVAN) 0.5 MG tablet Take 0.5 mg by mouth at bedtime as needed. For insomnia.      . mirtazapine (REMERON) 15 MG tablet Take 1 tablet (15 mg total) by mouth at bedtime.  30 tablet  11  . Multiple Vitamin (MULTIVITAMIN WITH MINERALS) TABS Take 1 tablet by mouth daily.      . potassium chloride (K-DUR,KLOR-CON) 10 MEQ tablet Take 10 mEq by mouth 3 (three) times daily.      . tamsulosin (FLOMAX) 0.4 MG CAPS capsule Take 0.4 mg by mouth.      . vancomycin (VANCOCIN) 50 mg/mL oral solution Take 2.5 mLs (125 mg total) by mouth 4 (four) times daily.  200 mL  0   No current facility-administered medications on file prior to visit.  Active Ambulatory Problems    Diagnosis Date Noted  . CAD (coronary artery disease) 05/09/2011  . Paroxysmal a-fib 05/09/2011  . DM (diabetes mellitus), type 2, uncontrolled with complications 05/09/2011  . Hypertension 05/09/2011  . S/P CABG (coronary artery bypass graft) 05/09/2011  . Hyperlipidemia 05/09/2011  . Hypothyroidism 05/29/2011  . Recurrent colitis due to Clostridium difficile 06/30/2011  . Anemia 08/16/2011  . Septic shock 09/08/2011  . Thrombocytopenia 09/08/2011  . Acute on chronic combined systolic and diastolic heart failure 12/24/2012  . Cellulitis 05/13/2013  . Melena 05/13/2013  . NSTEMI (non-ST elevated myocardial infarction) 05/14/2013  . HCAP (healthcare-associated pneumonia) 05/20/2013  . Fever 06/14/2013  . C. difficile diarrhea 06/15/2013   Resolved Ambulatory Problems    Diagnosis Date Noted  . Community acquired bacterial  pneumonia 05/09/2011  . Clostridium difficile colitis 05/29/2011  . Hypokalemia 05/29/2011  . Leukocytosis 05/29/2011  . UTI (lower urinary tract infection) 05/29/2011  . Diarrhea 06/25/2011  . Weakness generalized 06/25/2011  . Dehydration 06/25/2011  . Hypokalemia 08/16/2011  . Leukocytosis 08/16/2011  . Acute on chronic diastolic CHF (congestive heart failure) 08/20/2012  . DM2 (diabetes mellitus, type 2) 08/20/2012  . Systolic CHF, acute 08/23/2012   Past Medical History  Diagnosis Date  . Myocardial infarct   . Diabetes mellitus   . CHF (congestive heart failure)   . Coronary artery disease   . Clostridium difficile diarrhea   . Pneumonia 04/2011  . Shortness of breath   . Chronic kidney disease   . Dysrhythmia   . Paroxysmal atrial fibrillation   . DNR (do not resuscitate)       Review of Systems +diarrhea, abdominal cramping, anorexia. No blood in stool. No fevers, chills, nightsweats. 10 point ros otherwise negative    Objective:   Physical Exam BP 99/69  Pulse 106  Temp(Src) 97.1 F (36.2 C) (Oral) gen = elderly male, appears in NAD HEENT= hard of hearing, no scleral icterus, moist mucosa Cors= irreg, irreg, Abd= BS+ Skin = warm, no rash, scattered echymosis Ext= left elbow lipoma     Assessment & Plan:  Presumed relapse/recurrent cdifficile = did not tolerate long taper off of oral vancomycin. Will retest stool to see if still + cdiff.  refer back to dr. Leone Payor for re-evaluation for fecal transplant. His daughter who did the original fecal transplant had 4 days of doxycycline last month. i have asked the family to find a different donor that we will need to do testing. Will also work on irb approval for fecal transplant. If uncertain that he can tolerate colonoscopy, we can look into fecal enema vs. Delivery by ng tube - continue on vanco 125mg  QID x 14 days then do TID until we see him in 2 wks. Will check cbc and bmp to ensure no significant electrolyte  abn.  Anorexia = recommended to increase glucerna to 2-3 cans if having little to eat  Rtc in 2 wks

## 2013-08-20 NOTE — Telephone Encounter (Signed)
Patient scheduled for 08/28/13 10:15.  Dr. Drue Second also notified

## 2013-08-20 NOTE — Telephone Encounter (Signed)
I have left a message for Dr. Drue Second that Dr. Elzie Rings is out of the office and will return tomorrow.  Next  Available hospital week is 3/24 and should be able to schedule that week.  Dr. Leone Payor are you ok for doing this direct or do you want to see him first?

## 2013-08-21 LAB — CLOSTRIDIUM DIFFICILE BY PCR: Toxigenic C. Difficile by PCR: NOT DETECTED

## 2013-08-28 ENCOUNTER — Telehealth: Payer: Self-pay | Admitting: Internal Medicine

## 2013-08-28 ENCOUNTER — Encounter: Payer: Self-pay | Admitting: Internal Medicine

## 2013-08-28 ENCOUNTER — Ambulatory Visit (INDEPENDENT_AMBULATORY_CARE_PROVIDER_SITE_OTHER): Payer: Medicare Other | Admitting: Internal Medicine

## 2013-08-28 VITALS — BP 96/60 | HR 116 | Ht 73.0 in | Wt 186.1 lb

## 2013-08-28 DIAGNOSIS — R197 Diarrhea, unspecified: Secondary | ICD-10-CM | POA: Insufficient documentation

## 2013-08-28 DIAGNOSIS — R350 Frequency of micturition: Secondary | ICD-10-CM

## 2013-08-28 DIAGNOSIS — N138 Other obstructive and reflux uropathy: Secondary | ICD-10-CM

## 2013-08-28 DIAGNOSIS — N401 Enlarged prostate with lower urinary tract symptoms: Secondary | ICD-10-CM | POA: Insufficient documentation

## 2013-08-28 DIAGNOSIS — A0471 Enterocolitis due to Clostridium difficile, recurrent: Secondary | ICD-10-CM

## 2013-08-28 DIAGNOSIS — A0472 Enterocolitis due to Clostridium difficile, not specified as recurrent: Secondary | ICD-10-CM

## 2013-08-28 MED ORDER — COLESTIPOL HCL 5 G PO GRAN: 5.0000 g | GRANULES | Freq: Every day | ORAL | Status: DC

## 2013-08-28 MED ORDER — VANCOMYCIN 50 MG/ML ORAL SOLUTION: 125.0000 mg | Freq: Three times a day (TID) | ORAL | Status: DC

## 2013-08-28 NOTE — Progress Notes (Signed)
Subjective:    Patient ID: David Davidson, male    DOB: Feb 01, 1911, 80 y.o.   MRN: [id number]  HPI The patient is here with his son and daughter, because of persistent diarrhea problems and recurrent Clostridium difficile diarrhea. He is needed antibiotics in the last several months and has been hospitalized and has developed C. difficile again, after successful treatment with fecal transplant 1 year or so ago. He was seen by Dr. Drue Second of infectious disease last week, he was still having diarrhea then but it C. diff PCR has returned negative  He is complaining of urinary frequency and small loose bowel movements when he goes to urinate. He is not sleeping well because of this. He has known chronic BPH problems. He has been trying some Ambien for sleep. Tylenol PM is on the list but he is not using any Benadryl or anything like that.  He says today he had very loose and dark black stool.  Appetite is off, he is on vancomycin at this time, he is eating some applesauce and soup and taking Glucerna 3 times a day.  Medications, allergies, past medical history, past surgical history, family history and social history are reviewed and updated in the EMR.  Review of Systems He is in a wheelchair though he can transfer out of that on his own or with limited assistance.    Objective:   Physical Exam Chronically ill elderly white man in no acute distress Rectal exam reveals soft brown stool that is heme-negative and no mass  Data reviewed: Infectious disease Notes, hospitalization records labs in the EMR    Assessment & Plan:  Recurrent colitis due to Clostridium difficile Last PCR negative 3/11 so don't think this is active. 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 He is certainly getting more frail, there is no way to justify a fecal transplant at this point, if we did need to do in the future I would be inclined to try to use an enema to  administer rather than a colonoscopy or potentially just a sigmoidoscopy exam, rather than a full colonoscopy given his overall condition and inability to tolerate any complications which is minimal at best. I am going to start Colestipol 5 g with supper to see if that helps his diarrhea. We will taper the vancomycin. I have discussed this with Dr. Drue Second. He will stop his fish oil, I'm not sure how much as helping 80 year old man anyway, keep in mind that they Glucerna may be contributing some to the loose stools. Hopefully his appetite will improve when he comes off vancomycin.  Diarrhea with negative C. difficile PCR Taper vancomycin, try colestipol. Plan to call him next week though I see he has an appointment with Dr. Drue Second on 3/25.  Benign prostatic hypertrophy with urinary frequency I think this is his main problem keeping him up at night, his his urinary frequency. There may not be much more that can be done, he does have a urologist, Dr. Laverle Patter.    Current outpatient prescriptions:atorvastatin (LIPITOR) 10 MG tablet, Take 10 mg by mouth daily., Disp: , Rfl: ;  feeding supplement, GLUCERNA SHAKE, (GLUCERNA SHAKE) LIQD, Take 237 mLs by mouth daily., Disp: , Rfl: 0;  finasteride (PROSCAR) 5 MG tablet, Take 5 mg by mouth every morning. , Disp: , Rfl: ;  glipiZIDE (GLUCOTROL XL) 10 MG 24 hr tablet, Take 10 mg by mouth daily with  breakfast., Disp: , Rfl:  glyBURIDE (DIABETA) 5 MG tablet, Take 2.5-5 mg by mouth daily as needed (high blood sugar). , Disp: , Rfl: ;  levothyroxine (SYNTHROID, LEVOTHROID) 50 MCG tablet, Take 50 mcg by mouth daily before breakfast., Disp: , Rfl: ;  mirtazapine (REMERON) 15 MG tablet, Take 1 tablet (15 mg total) by mouth at bedtime., Disp: 30 tablet, Rfl: 11;  Multiple Vitamin (MULTIVITAMIN WITH MINERALS) TABS, Take 1 tablet by mouth daily., Disp: , Rfl:  potassium chloride (K-DUR,KLOR-CON) 10 MEQ tablet, Take 10 mEq by mouth 3 (three) times daily., Disp: , Rfl: ;   tamsulosin (FLOMAX) 0.4 MG CAPS capsule, Take 0.4 mg by mouth., Disp: , Rfl: ;  vancomycin (VANCOCIN) 50 mg/mL oral solution, Take 2.5 mLs (125 mg total) by mouth 3 (three) times daily. Reduce to bid, then qd then off weekly, Disp: 200 mL, Rfl: 1;  zolpidem (AMBIEN) 10 MG tablet, Take 10 mg by mouth at bedtime. , Disp: , Rfl:  colestipol (COLESTID) 5 G granules, Take 5 g by mouth daily with supper., Disp: 500 g, Rfl: 2   CC: Thayer HeadingsMACKENZIE,BRIAN, MD Judyann Munsonynthia Snider, MD

## 2013-08-28 NOTE — Patient Instructions (Addendum)
Stop your Fish Oil.  We have sent the following medications to your pharmacy for you to pick up at your convenience: Colestipol, vancomycin  We will call you next week to check on you.    I appreciate the opportunity to care for you.

## 2013-08-28 NOTE — Assessment & Plan Note (Signed)
I think this is his main problem keeping him up at night, his his urinary frequency. There may not be much more that can be done, he does have a urologist, Dr. Laverle Patter.

## 2013-08-28 NOTE — Assessment & Plan Note (Signed)
Taper vancomycin, try colestipol. Plan to call him next week though I see he has an appointment with Dr. Drue Second on 3/25.

## 2013-08-28 NOTE — Assessment & Plan Note (Addendum)
Last PCR negative 3/11 so don't think this is active. 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 He is certainly getting more frail, there is no way to justify a fecal transplant at this point, if we did need to do in the future I would be inclined to try to use an enema to administer rather than a colonoscopy or potentially just a sigmoidoscopy exam, rather than a full colonoscopy given his overall condition and inability to tolerate any complications which is minimal at best. I am going to start Colestipol 5 g with supper to see if that helps his diarrhea. We will taper the vancomycin. I have discussed this with Dr. Drue Second. He will stop his fish oil, I'm not sure how much as helping 80 year old man anyway, keep in mind that they Glucerna may be contributing some to the loose stools. Hopefully his appetite will improve when he comes off vancomycin.

## 2013-08-28 NOTE — Telephone Encounter (Signed)
Spoke to David Davidson, Pharmacist with [facility] .  Patient is to take the Vancomycin TID for 1 week, then BID for 1 week, then qd for a week.

## 2013-09-01 ENCOUNTER — Telehealth: Payer: Self-pay | Admitting: Internal Medicine

## 2013-09-01 NOTE — Telephone Encounter (Signed)
Patient states that patient had a reaction to colestipol.  He had to go to bed instantly when he took it the other day. "He blacked out" after taking it.   His diarrhea is improving and they have follow up with Dr. Drue Second on [facility]

## 2013-09-03 ENCOUNTER — Ambulatory Visit: Payer: Medicare Other | Admitting: Internal Medicine

## 2013-09-03 ENCOUNTER — Telehealth: Payer: Self-pay

## 2013-09-03 NOTE — Telephone Encounter (Signed)
Spoke to son Elnita Maxwell to check on Nora.  He said Dad has cut back on his milk intake and is doing well in regards with less loose stools.

## 2013-09-05 ENCOUNTER — Emergency Department (HOSPITAL_COMMUNITY): Payer: Medicare Other

## 2013-09-05 ENCOUNTER — Encounter (HOSPITAL_COMMUNITY): Payer: Self-pay | Admitting: Emergency Medicine

## 2013-09-05 ENCOUNTER — Emergency Department (HOSPITAL_COMMUNITY)
Admission: EM | Admit: 2013-09-05 | Discharge: 2013-09-05 | Disposition: A | Discharge status: Home / Self Care | Payer: Medicare Other | Attending: Emergency Medicine | Admitting: Emergency Medicine

## 2013-09-05 DIAGNOSIS — I2510 Atherosclerotic heart disease of native coronary artery without angina pectoris: Secondary | ICD-10-CM | POA: Insufficient documentation

## 2013-09-05 DIAGNOSIS — I4891 Unspecified atrial fibrillation: Secondary | ICD-10-CM | POA: Insufficient documentation

## 2013-09-05 DIAGNOSIS — S79919A Unspecified injury of unspecified hip, initial encounter: Secondary | ICD-10-CM | POA: Insufficient documentation

## 2013-09-05 DIAGNOSIS — W010XXA Fall on same level from slipping, tripping and stumbling without subsequent striking against object, initial encounter: Secondary | ICD-10-CM | POA: Insufficient documentation

## 2013-09-05 DIAGNOSIS — Z8701 Personal history of pneumonia (recurrent): Secondary | ICD-10-CM | POA: Insufficient documentation

## 2013-09-05 DIAGNOSIS — Y9389 Activity, other specified: Secondary | ICD-10-CM | POA: Insufficient documentation

## 2013-09-05 DIAGNOSIS — Z87891 Personal history of nicotine dependence: Secondary | ICD-10-CM | POA: Insufficient documentation

## 2013-09-05 DIAGNOSIS — Y929 Unspecified place or not applicable: Secondary | ICD-10-CM | POA: Insufficient documentation

## 2013-09-05 DIAGNOSIS — W19XXXA Unspecified fall, initial encounter: Secondary | ICD-10-CM

## 2013-09-05 DIAGNOSIS — N189 Chronic kidney disease, unspecified: Secondary | ICD-10-CM | POA: Insufficient documentation

## 2013-09-05 DIAGNOSIS — E119 Type 2 diabetes mellitus without complications: Secondary | ICD-10-CM | POA: Insufficient documentation

## 2013-09-05 DIAGNOSIS — Z8619 Personal history of other infectious and parasitic diseases: Secondary | ICD-10-CM | POA: Insufficient documentation

## 2013-09-05 DIAGNOSIS — M25552 Pain in left hip: Secondary | ICD-10-CM

## 2013-09-05 DIAGNOSIS — R011 Cardiac murmur, unspecified: Secondary | ICD-10-CM | POA: Insufficient documentation

## 2013-09-05 DIAGNOSIS — I252 Old myocardial infarction: Secondary | ICD-10-CM | POA: Insufficient documentation

## 2013-09-05 DIAGNOSIS — Z79899 Other long term (current) drug therapy: Secondary | ICD-10-CM | POA: Insufficient documentation

## 2013-09-05 DIAGNOSIS — Z951 Presence of aortocoronary bypass graft: Secondary | ICD-10-CM | POA: Insufficient documentation

## 2013-09-05 DIAGNOSIS — Z9889 Other specified postprocedural states: Secondary | ICD-10-CM | POA: Insufficient documentation

## 2013-09-05 DIAGNOSIS — S79929A Unspecified injury of unspecified thigh, initial encounter: Principal | ICD-10-CM

## 2013-09-05 DIAGNOSIS — I509 Heart failure, unspecified: Secondary | ICD-10-CM | POA: Insufficient documentation

## 2013-09-05 DIAGNOSIS — I129 Hypertensive chronic kidney disease with stage 1 through stage 4 chronic kidney disease, or unspecified chronic kidney disease: Secondary | ICD-10-CM | POA: Insufficient documentation

## 2013-09-05 DIAGNOSIS — E785 Hyperlipidemia, unspecified: Secondary | ICD-10-CM | POA: Insufficient documentation

## 2013-09-05 LAB — TYPE AND SCREEN
ABO/RH(D): A POS
Antibody Screen: NEGATIVE

## 2013-09-05 LAB — CBC WITH DIFFERENTIAL/PLATELET
Basophils Absolute: 0 10*3/uL (ref 0.0–0.1)
Basophils Relative: 0 % (ref 0–1)
EOS ABS: 0.2 10*3/uL (ref 0.0–0.7)
EOS PCT: 4 % (ref 0–5)
HEMATOCRIT: 35 % — AB (ref 39.0–52.0)
HEMOGLOBIN: 11.4 g/dL — AB (ref 13.0–17.0)
LYMPHS PCT: 35 % (ref 12–46)
Lymphs Abs: 1.4 10*3/uL (ref 0.7–4.0)
MCH: 31.8 pg (ref 26.0–34.0)
MCHC: 32.6 g/dL (ref 30.0–36.0)
MCV: 97.8 fL (ref 78.0–100.0)
MONO ABS: 0.7 10*3/uL (ref 0.1–1.0)
MONOS PCT: 17 % — AB (ref 3–12)
Neutro Abs: 1.8 10*3/uL (ref 1.7–7.7)
Neutrophils Relative %: 44 % (ref 43–77)
Platelets: 87 10*3/uL — ABNORMAL LOW (ref 150–400)
RBC: 3.58 MIL/uL — AB (ref 4.22–5.81)
RDW: 14.9 % (ref 11.5–15.5)
WBC: 4.1 10*3/uL (ref 4.0–10.5)

## 2013-09-05 LAB — BASIC METABOLIC PANEL
BUN: 15 mg/dL (ref 6–23)
CALCIUM: 9.1 mg/dL (ref 8.4–10.5)
CHLORIDE: 108 meq/L (ref 96–112)
CO2: 24 meq/L (ref 19–32)
Creatinine, Ser: 0.89 mg/dL (ref 0.50–1.35)
GFR calc Af Amer: 79 mL/min — ABNORMAL LOW (ref >90)
GFR calc non Af Amer: 68 mL/min — ABNORMAL LOW (ref >90)
GLUCOSE: 136 mg/dL — AB (ref 70–99)
POTASSIUM: 4.4 meq/L (ref 3.7–5.3)
SODIUM: 142 meq/L (ref 137–147)

## 2013-09-05 LAB — PROTIME-INR
INR: 1.16 (ref 0.00–1.49)
Prothrombin Time: 14.6 seconds (ref 11.6–15.2)

## 2013-09-05 MED ORDER — HYDROCODONE-ACETAMINOPHEN 5-325 MG PO TABS: 1.0000 | ORAL_TABLET | Freq: Four times a day (QID) | ORAL | Status: DC | PRN

## 2013-09-05 MED ORDER — FENTANYL CITRATE 0.05 MG/ML IJ SOLN
50.0000 ug | INTRAMUSCULAR | Status: AC | PRN
  Administered 2013-09-05 (×2): 50 ug via INTRAVENOUS
  Filled 2013-09-05 (×2): qty 2

## 2013-09-05 MED ORDER — SODIUM CHLORIDE 0.9 % IV SOLN
1000.0000 mL | INTRAVENOUS | Status: DC
  Administered 2013-09-05: 1000 mL via INTRAVENOUS

## 2013-09-05 NOTE — ED Notes (Signed)
Bed: ZO10 Expected date:  Expected time:  Means of arrival:  Comments: EMS/fall/hip pain

## 2013-09-05 NOTE — ED Notes (Signed)
Pt unable to bare weight upon standing. Dr. Jeraldine Loots is aware.

## 2013-09-05 NOTE — ED Provider Notes (Signed)
CSN: [id number]     Arrival date & time 09/05/13  [id number] History   First MD Initiated Contact with Patient 09/05/13 [phone]     Chief Complaint  Patient presents with  . Fall  . Hip Injury     (Consider location/radiation/quality/duration/timing/severity/associated sxs/prior Treatment) The history is provided by the patient.   Patient presents after a fall that occurred ~2 h pta.  He slipped en route to the washroom. Since that time he has had severe pain in the L hip. Pain is sharp, worse w motion. No head trauma, no LOC, no neck pain, no other complaints. Pain is worse w flexion or extension of the hip.  Mild relief with rest. No distal dysesthesia / knee or ankle pain, though the patient cannot move these areas 2/2 pain in the hip.   Past Medical History  Diagnosis Date  . Myocardial infarct   . Diabetes mellitus   . Hypertension   . CHF (congestive heart failure)   . Coronary artery disease   . Clostridium difficile diarrhea     recurrent   . Hyperlipidemia   . Pneumonia 04/2011  . Shortness of breath     only with my heart failure "  . Chronic kidney disease   . Dysrhythmia   . Paroxysmal atrial fibrillation   . DNR (do not resuscitate)    Past Surgical History  Procedure Laterality Date  . Coronary artery bypass graft    . Cholecystectomy    . Cardiac catheterization  10/10/2001  . Cystoscopy, turbt  05/09/2006, 02/19/2006, 04/19/2005, 04/21/2003  . Colonoscopy  01/31/2012    Procedure: COLONOSCOPY;  Surgeon: Iva Booparl E Gessner, MD;  Location: [facility];  Service: Endoscopy;  Laterality: N/A;  fecal transplant/cynthia snyder will prepare the sample   Family History  Problem Relation Age of Onset  . Stomach cancer Mother   . Coronary artery disease Father   . Diabetes Son   . Colon cancer Neg Hx    History  Substance Use Topics  . Smoking status: Former Smoker -- 1.00 packs/day    Types: Cigarettes    Quit date: 05/08/1974  . Smokeless tobacco: Never Used  .  Alcohol Use: No     Comment: occasionally    Review of Systems  Constitutional:       Per HPI, otherwise negative  HENT:       Per HPI, otherwise negative  Respiratory:       Per HPI, otherwise negative  Cardiovascular:       Per HPI, otherwise negative  Gastrointestinal: Negative for vomiting.  Endocrine:       Negative aside from HPI  Genitourinary:       Neg aside from HPI   Musculoskeletal:       Per HPI, otherwise negative  Skin: Negative.   Neurological: Positive for weakness. Negative for syncope and headaches.       Generalized weakness for some time.      Allergies  Colestipol and Morphine and related  Home Medications   Current Outpatient Rx  Name  Route  Sig  Dispense  Refill  . atorvastatin (LIPITOR) 10 MG tablet   Oral   Take 10 mg by mouth daily.         . colestipol (COLESTID) 5 G granules   Oral   Take 5 g by mouth daily with supper.   500 g   2   . feeding supplement, GLUCERNA SHAKE, (GLUCERNA SHAKE) LIQD   Oral  Take 237 mLs by mouth daily.      0   . finasteride (PROSCAR) 5 MG tablet   Oral   Take 5 mg by mouth every morning.          Marland Kitchen glipiZIDE (GLUCOTROL XL) 10 MG 24 hr tablet   Oral   Take 10 mg by mouth daily with breakfast.         . glyBURIDE (DIABETA) 5 MG tablet   Oral   Take 2.5-5 mg by mouth daily as needed (high blood sugar).          Marland Kitchen levothyroxine (SYNTHROID, LEVOTHROID) 50 MCG tablet   Oral   Take 50 mcg by mouth daily before breakfast.         . mirtazapine (REMERON) 15 MG tablet   Oral   Take 1 tablet (15 mg total) by mouth at bedtime.   30 tablet   11   . Multiple Vitamin (MULTIVITAMIN WITH MINERALS) TABS   Oral   Take 1 tablet by mouth daily.         . potassium chloride (K-DUR,KLOR-CON) 10 MEQ tablet   Oral   Take 10 mEq by mouth 3 (three) times daily.         . tamsulosin (FLOMAX) 0.4 MG CAPS capsule   Oral   Take 0.4 mg by mouth.         . vancomycin (VANCOCIN) 50 mg/mL oral  solution   Oral   Take 2.5 mLs (125 mg total) by mouth 3 (three) times daily. Reduce to bid, then qd then off weekly   200 mL   1   . zolpidem (AMBIEN) 10 MG tablet   Oral   Take 10 mg by mouth at bedtime.           BP 177/77  Pulse 89  Temp(Src) 98.7 F (37.1 C) (Oral)  Resp 18  SpO2 100% Physical Exam  Nursing note and vitals reviewed. Constitutional: He is oriented to person, place, and time. He appears well-developed. No distress.  HENT:  Head: Normocephalic and atraumatic.  Eyes: Conjunctivae and EOM are normal.  Cardiovascular: Normal rate, regular rhythm and intact distal pulses.   Murmur heard. Pulmonary/Chest: Effort normal. No stridor. No respiratory distress.  Abdominal: He exhibits no distension.  Musculoskeletal: He exhibits no edema.  No rotation or shortening of the LE, but TTP, w minimal pressure in L lateral hip w pain in this area with attempts to move either leg.  Neurological: He is alert and oriented to person, place, and time.  Poor hearing, otherwise NML CN exam.  Patient can move and sense the L distal LE, but will not move it 2/2 pain.  Skin: Skin is warm and dry.  Psychiatric: He has a normal mood and affect.    ED Course  Procedures (including critical care time) Labs Review Labs Reviewed  BASIC METABOLIC PANEL - Abnormal; Notable for the following:    Glucose, Bld 136 (*)    GFR calc non Af Amer 68 (*)    GFR calc Af Amer 79 (*)    All other components within normal limits  CBC WITH DIFFERENTIAL - Abnormal; Notable for the following:    RBC 3.58 (*)    Hemoglobin 11.4 (*)    HCT 35.0 (*)    Platelets 87 (*)    Monocytes Relative 17 (*)    All other components within normal limits  PROTIME-INR  TYPE AND SCREEN   Imaging Review Dg Chest  1 View  09/05/2013   CLINICAL DATA:  Post fall, history of heart disease  EXAM: CHEST - 1 VIEW  COMPARISON:  DG CHEST 1V PORT dated 06/16/2013; DG CHEST 1V PORT dated 06/14/2013  FINDINGS: Grossly  unchanged enlarged cardiac silhouette and mediastinal contours post median sternotomy and CABG. Overall improved aeration of the lungs with persistent perihilar and bibasilar opacities, left greater than right, likely atelectasis or scar. There is mild diffuse slightly nodular thickening of the pulmonary interstitium. No new focal airspace opacities. There is chronic blunting of the bilateral costophrenic angles without definite pleural effusion. No evidence of edema. No pneumothorax. No definite acute osseus abnormalities. Surgical clips overlie the upper abdomen.  IMPRESSION: 1. Improved aeration of the lungs without acute cardiopulmonary disease. 2. Cardiomegaly and pulmonary venous congestion without definite evidence of edema.   Electronically Signed   By: Simonne Come M.D.   On: 09/05/2013 08:31   Dg Hip Complete Left  09/05/2013   CLINICAL DATA:  Left hip pain post fall  EXAM: LEFT HIP - COMPLETE 2+ VIEW  COMPARISON:  None  FINDINGS: Osseous demineralization.  Hip and SI joints symmetric and preserved.  No acute fracture, dislocation or bone destruction.  Degenerative disc disease changes lower lumbar spine.  IMPRESSION: No acute osseous abnormalities.   Electronically Signed   By: Ulyses Southward M.D.   On: 09/05/2013 08:18   Ct Pelvis Wo Contrast  09/05/2013   CLINICAL DATA:  Larey Seat this morning.  Left hip pain.  EXAM: CT PELVIS WITHOUT CONTRAST  TECHNIQUE: Multidetector CT imaging of the pelvis was performed following the standard protocol without intravenous contrast.  COMPARISON:  None.  FINDINGS: There is no hip fracture or dislocation. There is no fracture of the right or left superior and inferior pubic rami. There are mild osteoarthritic changes of bilateral hips. There are mild degenerative changes of bilateral SI joints.  There is degenerative disc disease at L4-5 and to lesser degree L5-S1. There is a broad-based moderate-sized disc osteophyte complex at L4-5 impressing upon the ventral thecal sac.  There is severe bilateral facet arthropathy at L4-5 resulting in bilateral foraminal stenosis.  There is moderate bilateral facet arthropathy at L5-S1. There is a broad-based disc bulge with prominent foraminal components bilaterally resulting in foraminal stenosis.  There is no lytic or blastic osseous lesion. There is no fluid collection or hematoma.  There is a distal abdominal aortic aneurysm measuring 3.5 x 3.1 cm. The left common iliac artery measures 1.7 cm in short axis. The right common iliac artery measures 1.8 cm in short axis.  Mild prostatic enlargement.  IMPRESSION: 1. No hip fracture or dislocation. 2. Lower lumbar spine spondylosis at L4-5 and L5-S1 as described above. 3. Distal abdominal aortic aneurysm measuring 3.5 x 3.1 cm which is incompletely visualized. 4. Mild prostatic enlargement. Correlate with physical exam and PSA. 5. Mild osteoarthritis of bilateral hips.   Electronically Signed   By: Elige Ko   On: 09/05/2013 09:11   Ct Hip Left Wo Contrast  09/05/2013   CLINICAL DATA:  Larey Seat this morning.  Left hip pain.  EXAM: CT OF THE LEFT HIP WITHOUT CONTRAST  TECHNIQUE: Multidetector CT imaging was performed according to the standard protocol. Multiplanar CT image reconstructions were also generated.  COMPARISON:  None.  FINDINGS: There is no hip fracture or dislocation. There is no fracture of the right or left superior and inferior pubic rami. There are mild osteoarthritic changes of bilateral hips. There are mild degenerative changes  of bilateral SI joints.  There is degenerative disc disease at L4-5 and to lesser degree L5-S1. There is a broad-based moderate-sized disc osteophyte complex at L4-5 impressing upon the ventral thecal sac. There is severe bilateral facet arthropathy at L4-5 resulting in bilateral foraminal stenosis.  There is moderate bilateral facet arthropathy at L5-S1. There is a broad-based disc bulge with prominent foraminal components bilaterally resulting in foraminal  stenosis.  There is no lytic or blastic osseous lesion. There is no fluid collection or hematoma.  There is a distal abdominal aortic aneurysm measuring 3.5 x 3.1 cm. The left common iliac artery measures 1.7 cm in short axis. The right common iliac artery measures 1.8 cm in short axis.  Mild prostatic enlargement.  IMPRESSION: 1.  No acute osseous injury of the left hip.  2. Lower lumbar spine spondylosis at L4-5 and L5-S1 as described above.  3. Distal abdominal aortic aneurysm measuring 3.5 x 3.1 cm which is incompletely visualized.  4. Mild prostatic enlargement. Correlate with physical exam and PSA.  5. Mild osteoarthritis of bilateral hips.   Electronically Signed   By: Elige Ko   On: 09/05/2013 09:12    ECG - SR, 85, rbbb, no sig changes from prior abnormal   O2- 99%ra, nml  9:51 AM On repeat exam the patient has inability to walk secondary to pain in the hip. Patient has already received x-ray and CT.  With this, his age, MRI will be ordered tor/o pathology.  2:38 PM Patient's MRI is unremarkable. On repeat exam the patient still is unable to walk.  He also denies any pain in his back, left leg, lowering the likelihood of his left lateral hip pain being referred from another location. Patient and family members request nursing home placement. I discussed his case with our social worker who will facilitate this.  MDM   Final diagnoses:  Fall  Hip pain, left    Patient presents with a fall with pain in his left hip.  Patient remains ambulatory, after thorough evaluation here, including x-ray, CT, MRI. With the patient's inability to ambulate, his age, he requested nursing home placement and this was accommodated.   Gerhard Munch, MD 09/05/13 1517

## 2013-09-05 NOTE — Progress Notes (Signed)
   CARE MANAGEMENT ED NOTE 09/05/2013  Patient:  David Davidson, David Davidson   Account Number:  [id number]  Date Initiated:  09/05/2013  Documentation initiated by:  Edd Arbour  Subjective/Objective Assessment:   80 yr old [employer] medic[employer] complete pt from home with complaint of fall in his bathroom at 0500 this morning , denied of LOC but claimed of pain on his left hip of 10/10.     Subjective/Objective Assessment Detail:   Received 3 doses of of fentanyl IV and of NS on board EMS. Pt. Denied of pain upon arrival, alert and oriented x2.  HOH per Dr Jeraldine Loots, EDP, who referred pt to ED CM when family inquired about placement. EDP states pt without an admitting dx for inpatient  1405- last seen by [facility] per Debbie on 09/02/13 therefore he is still active with [facility] and pt can be seen if d/c home     Action/Plan:   CM consulted by EDP, Jeraldine Loots to speak with pt/son about facility placement out of pocket  1402 Covering SW, Kelly, came to pt's room to assist with out of pocket placement to facility   Action/Plan Detail:   CM spoke with pt and his son, David Davidson at the bedside Cm reviewed CM consult, home health, facility placement out of pocket, Cm provided son with lists of [place] home health, private duty nursing, assisted living facility and snf   Anticipated DC Date:  09/05/2013     Status Recommendation to Physician:   Result of Recommendation:    Other ED Services  Consult Working Plan   In-house referral  Clinical Social Worker   DC Associate Professor  Other  Outpatient Services - Pt will follow up   [facility] Choice  HOME HEALTH   Choice offered to / List presented to:           [facility] agency  [facility]    Status of service:  Completed, signed off  ED Comments:   ED Comments Detail:

## 2013-09-05 NOTE — Progress Notes (Signed)
Clinical Social Work Department BRIEF PSYCHOSOCIAL ASSESSMENT 09/05/2013  Patient:  David Davidson, David Davidson     Account Number:  [id number]     Admit date:  09/05/2013  Clinical Social Worker:  Renold Genta  Date/Time:  09/05/2013 02:15 PM  Referred by:  Physician  Date Referred:  09/05/2013 Referred for  SNF Placement   Other Referral:   Interview type:  Patient Other interview type:   and son at bedside    PSYCHOSOCIAL DATA Living Status:  ALONE Admitted from facility:   Level of care:   Primary support name:  David Davidson (son) h#: [phone] c#: [id number] Primary support relationship to patient:  CHILD, ADULT Degree of support available:   good    CURRENT CONCERNS Current Concerns  Post-Acute Placement   Other Concerns:    SOCIAL WORK ASSESSMENT / PLAN CSW received consult for SNF placement.   Assessment/plan status:  Information/Referral to [employer] Other assessment/ plan:   Information/referral to community resources:   CSW completed FL2 and faxed information out to [facility] - spoke with Bryson Ha @ [facility] to see if they are able to take patient, awaiting call back.    PATIENT'S/FAMILY'S RESPONSE TO PLAN OF CARE: Patient & family are well-known to this CSW as I assisted patient to [facility] in January.    CSW met with patient & son at bedside who were both relieved to hear that patient would be able to be admitted to a SNF without needing a 3-night hospital stay (as patient is not meeting criteria to be admitted under inpatient stay).    Son reported that since he was discharged to [facility] 06/19/13 from [facility], he stayed there for about a month before returning home alone (with son & daughter-in-law living right next door and checking on him). [facility] had just stopped their services with the patient before he fell in the bathroom this morning and he was brought to the ED via [facility].    Awaiting call back from [facility] (first choice) & [facility] (second choice) re: bed availability.       Raynaldo Opitz, [facility] Clinical Social Worker cell #: [phone]

## 2013-09-05 NOTE — ED Notes (Signed)
Patient to MRI.

## 2013-09-05 NOTE — Discharge Instructions (Signed)
As discussed, your imaging results today have been largely reassuring.  However, with your hip pain it is important that you follow-up with our orthopedists for appropriate ongoing care.  Return here for concerning changes in your condition.

## 2013-09-05 NOTE — Progress Notes (Signed)
Clinical Social Work Department CLINICAL SOCIAL WORK PLACEMENT NOTE 09/05/2013  Patient:  David Davidson, David Davidson  Account Number:  [id number] Admit date:  09/05/2013  Clinical Social Worker:  Orpah Greek  Date/time:  09/05/2013 02:25 PM  Clinical Social Work is seeking post-discharge placement for this patient at the following level of care:   SKILLED NURSING   (*CSW will update this form in Epic as items are completed)   09/05/2013  Patient/family provided with [facility] Department of Clinical Social Work's list of facilities offering this level of care within the geographic area requested by the patient (or if unable, by the patient's family).  09/05/2013  Patient/family informed of their freedom to choose among providers that offer the needed level of care, that participate in Medicare, Medicaid or managed care program needed by the patient, have an available bed and are willing to accept the patient.  09/05/2013  Patient/family informed of [facility]' ownership interest in [facility], as well as of the fact that they are under no obligation to receive care at this facility.  PASARR submitted to EDS on 09/05/2013 PASARR number received from EDS on 09/05/2013  FL2 transmitted to all facilities in geographic area requested by pt/family on  09/05/2013 FL2 transmitted to all facilities within larger geographic area on   Patient informed that his/her managed care company has contracts with or will negotiate with  certain facilities, including the following:     Patient/family informed of bed offers received:   Patient chooses bed at  Physician recommends and patient chooses bed at    Patient to be transferred to  on   Patient to be transferred to facility by   The following physician request were entered in Epic:   Additional Comments:   Lincoln Maxin, LCSW [facility] Clinical Social Worker cell #: [phone]

## 2013-09-05 NOTE — ED Notes (Signed)
Per EMS, pt. Is from home with complaint of fall in his bathroom at 0500 this morning , denied of LOC but claimed of pain on his left hip of 10/10. Pt. Received 3 doses of of fentanyl IV and of NS on board EMS. Pt. Denied of pain upon arrival, alert and oriented x2.

## 2013-09-05 NOTE — Progress Notes (Signed)
Jill Side from [facility] called & said they would be able to take patient today. Patient & family at bedside aware. Patient to transport to SNF via [facility].   Clinical Social Work Department CLINICAL SOCIAL WORK PLACEMENT NOTE 09/05/2013  Patient:  David Davidson, David Davidson  Account Number:  [id number] Admit date:  09/05/2013  Clinical Social Worker:  Orpah Greek  Date/time:  09/05/2013 02:25 PM  Clinical Social Work is seeking post-discharge placement for this patient at the following level of care:   SKILLED NURSING   (*CSW will update this form in Epic as items are completed)   09/05/2013  Patient/family provided with [facility] Department of Clinical Social Work's list of facilities offering this level of care within the geographic area requested by the patient (or if unable, by the patient's family).  09/05/2013  Patient/family informed of their freedom to choose among providers that offer the needed level of care, that participate in Medicare, Medicaid or managed care program needed by the patient, have an available bed and are willing to accept the patient.  09/05/2013  Patient/family informed of [facility]' ownership interest in [facility], as well as of the fact that they are under no obligation to receive care at this facility.  PASARR submitted to EDS on 09/05/2013 PASARR number received from EDS on 09/05/2013  FL2 transmitted to all facilities in geographic area requested by pt/family on  09/05/2013 FL2 transmitted to all facilities within larger geographic area on   Patient informed that his/her managed care company has contracts with or will negotiate with  certain facilities, including the following:     Patient/family informed of bed offers received:  09/05/2013 Patient chooses bed at [facility], [place] Physician recommends and patient chooses bed at    [facility] [place] on   09/05/2013 Patient to be transferred to facility by [facility]  The following physician request were entered in Epic:   Additional Comments:   Lincoln Maxin, LCSW [facility] Clinical Social Worker cell #: [phone]

## 2013-09-05 NOTE — ED Notes (Signed)
Waiting for [facility] 

## 2013-09-28 ENCOUNTER — Inpatient Hospital Stay (HOSPITAL_COMMUNITY)
Admission: EM | Admit: 2013-09-28 | Discharge: 2013-10-07 | DRG: 871 | Disposition: A | Discharge status: Skilled Nursing Facility | Payer: Medicare Other | Attending: Internal Medicine | Admitting: Internal Medicine

## 2013-09-28 ENCOUNTER — Emergency Department (HOSPITAL_COMMUNITY): Payer: Medicare Other

## 2013-09-28 ENCOUNTER — Encounter (HOSPITAL_COMMUNITY): Payer: Self-pay | Admitting: Emergency Medicine

## 2013-09-28 DIAGNOSIS — E1165 Type 2 diabetes mellitus with hyperglycemia: Secondary | ICD-10-CM | POA: Diagnosis present

## 2013-09-28 DIAGNOSIS — I509 Heart failure, unspecified: Secondary | ICD-10-CM | POA: Diagnosis present

## 2013-09-28 DIAGNOSIS — R197 Diarrhea, unspecified: Secondary | ICD-10-CM

## 2013-09-28 DIAGNOSIS — Z888 Allergy status to other drugs, medicaments and biological substances status: Secondary | ICD-10-CM

## 2013-09-28 DIAGNOSIS — IMO0002 Reserved for concepts with insufficient information to code with codable children: Secondary | ICD-10-CM

## 2013-09-28 DIAGNOSIS — I428 Other cardiomyopathies: Secondary | ICD-10-CM | POA: Diagnosis present

## 2013-09-28 DIAGNOSIS — H9190 Unspecified hearing loss, unspecified ear: Secondary | ICD-10-CM | POA: Diagnosis present

## 2013-09-28 DIAGNOSIS — I2510 Atherosclerotic heart disease of native coronary artery without angina pectoris: Secondary | ICD-10-CM

## 2013-09-28 DIAGNOSIS — R5381 Other malaise: Secondary | ICD-10-CM | POA: Diagnosis present

## 2013-09-28 DIAGNOSIS — I350 Nonrheumatic aortic (valve) stenosis: Secondary | ICD-10-CM | POA: Diagnosis present

## 2013-09-28 DIAGNOSIS — N390 Urinary tract infection, site not specified: Secondary | ICD-10-CM | POA: Diagnosis present

## 2013-09-28 DIAGNOSIS — Z9181 History of falling: Secondary | ICD-10-CM

## 2013-09-28 DIAGNOSIS — E785 Hyperlipidemia, unspecified: Secondary | ICD-10-CM

## 2013-09-28 DIAGNOSIS — D696 Thrombocytopenia, unspecified: Secondary | ICD-10-CM

## 2013-09-28 DIAGNOSIS — Z87891 Personal history of nicotine dependence: Secondary | ICD-10-CM

## 2013-09-28 DIAGNOSIS — R350 Frequency of micturition: Secondary | ICD-10-CM | POA: Diagnosis present

## 2013-09-28 DIAGNOSIS — A414 Sepsis due to anaerobes: Principal | ICD-10-CM | POA: Diagnosis present

## 2013-09-28 DIAGNOSIS — R351 Nocturia: Secondary | ICD-10-CM | POA: Diagnosis present

## 2013-09-28 DIAGNOSIS — E876 Hypokalemia: Secondary | ICD-10-CM | POA: Diagnosis not present

## 2013-09-28 DIAGNOSIS — I480 Paroxysmal atrial fibrillation: Secondary | ICD-10-CM | POA: Diagnosis present

## 2013-09-28 DIAGNOSIS — A0472 Enterocolitis due to Clostridium difficile, not specified as recurrent: Secondary | ICD-10-CM

## 2013-09-28 DIAGNOSIS — R5383 Other fatigue: Secondary | ICD-10-CM

## 2013-09-28 DIAGNOSIS — I5022 Chronic systolic (congestive) heart failure: Secondary | ICD-10-CM

## 2013-09-28 DIAGNOSIS — I4891 Unspecified atrial fibrillation: Secondary | ICD-10-CM | POA: Diagnosis present

## 2013-09-28 DIAGNOSIS — E039 Hypothyroidism, unspecified: Secondary | ICD-10-CM

## 2013-09-28 DIAGNOSIS — Z833 Family history of diabetes mellitus: Secondary | ICD-10-CM

## 2013-09-28 DIAGNOSIS — I359 Nonrheumatic aortic valve disorder, unspecified: Secondary | ICD-10-CM | POA: Diagnosis present

## 2013-09-28 DIAGNOSIS — R6520 Severe sepsis without septic shock: Secondary | ICD-10-CM

## 2013-09-28 DIAGNOSIS — I129 Hypertensive chronic kidney disease with stage 1 through stage 4 chronic kidney disease, or unspecified chronic kidney disease: Secondary | ICD-10-CM | POA: Diagnosis present

## 2013-09-28 DIAGNOSIS — J189 Pneumonia, unspecified organism: Secondary | ICD-10-CM

## 2013-09-28 DIAGNOSIS — R000 Tachycardia, unspecified: Secondary | ICD-10-CM | POA: Diagnosis present

## 2013-09-28 DIAGNOSIS — I10 Essential (primary) hypertension: Secondary | ICD-10-CM | POA: Diagnosis present

## 2013-09-28 DIAGNOSIS — N400 Enlarged prostate without lower urinary tract symptoms: Secondary | ICD-10-CM | POA: Diagnosis present

## 2013-09-28 DIAGNOSIS — Z66 Do not resuscitate: Secondary | ICD-10-CM | POA: Diagnosis present

## 2013-09-28 DIAGNOSIS — I872 Venous insufficiency (chronic) (peripheral): Secondary | ICD-10-CM | POA: Diagnosis present

## 2013-09-28 DIAGNOSIS — R7881 Bacteremia: Secondary | ICD-10-CM

## 2013-09-28 DIAGNOSIS — D649 Anemia, unspecified: Secondary | ICD-10-CM

## 2013-09-28 DIAGNOSIS — Z951 Presence of aortocoronary bypass graft: Secondary | ICD-10-CM

## 2013-09-28 DIAGNOSIS — I5043 Acute on chronic combined systolic (congestive) and diastolic (congestive) heart failure: Secondary | ICD-10-CM

## 2013-09-28 DIAGNOSIS — R6521 Severe sepsis with septic shock: Secondary | ICD-10-CM

## 2013-09-28 DIAGNOSIS — R748 Abnormal levels of other serum enzymes: Secondary | ICD-10-CM | POA: Diagnosis present

## 2013-09-28 DIAGNOSIS — Z885 Allergy status to narcotic agent status: Secondary | ICD-10-CM

## 2013-09-28 DIAGNOSIS — I959 Hypotension, unspecified: Secondary | ICD-10-CM | POA: Diagnosis present

## 2013-09-28 DIAGNOSIS — R7989 Other specified abnormal findings of blood chemistry: Secondary | ICD-10-CM

## 2013-09-28 DIAGNOSIS — I4510 Unspecified right bundle-branch block: Secondary | ICD-10-CM | POA: Diagnosis present

## 2013-09-28 DIAGNOSIS — N401 Enlarged prostate with lower urinary tract symptoms: Secondary | ICD-10-CM

## 2013-09-28 DIAGNOSIS — B3749 Other urogenital candidiasis: Secondary | ICD-10-CM | POA: Diagnosis present

## 2013-09-28 DIAGNOSIS — I214 Non-ST elevation (NSTEMI) myocardial infarction: Secondary | ICD-10-CM | POA: Diagnosis present

## 2013-09-28 DIAGNOSIS — I5042 Chronic combined systolic (congestive) and diastolic (congestive) heart failure: Secondary | ICD-10-CM | POA: Diagnosis present

## 2013-09-28 DIAGNOSIS — Z79899 Other long term (current) drug therapy: Secondary | ICD-10-CM

## 2013-09-28 DIAGNOSIS — A419 Sepsis, unspecified organism: Secondary | ICD-10-CM

## 2013-09-28 DIAGNOSIS — N183 Chronic kidney disease, stage 3 unspecified: Secondary | ICD-10-CM | POA: Diagnosis present

## 2013-09-28 DIAGNOSIS — IMO0001 Reserved for inherently not codable concepts without codable children: Secondary | ICD-10-CM | POA: Diagnosis present

## 2013-09-28 DIAGNOSIS — Z8249 Family history of ischemic heart disease and other diseases of the circulatory system: Secondary | ICD-10-CM

## 2013-09-28 DIAGNOSIS — I252 Old myocardial infarction: Secondary | ICD-10-CM

## 2013-09-28 DIAGNOSIS — Z2989 Encounter for other specified prophylactic measures: Secondary | ICD-10-CM

## 2013-09-28 DIAGNOSIS — R778 Other specified abnormalities of plasma proteins: Secondary | ICD-10-CM | POA: Diagnosis present

## 2013-09-28 DIAGNOSIS — A0471 Enterocolitis due to Clostridium difficile, recurrent: Secondary | ICD-10-CM | POA: Diagnosis present

## 2013-09-28 DIAGNOSIS — I4892 Unspecified atrial flutter: Secondary | ICD-10-CM

## 2013-09-28 DIAGNOSIS — E118 Type 2 diabetes mellitus with unspecified complications: Secondary | ICD-10-CM

## 2013-09-28 DIAGNOSIS — L03119 Cellulitis of unspecified part of limb: Secondary | ICD-10-CM

## 2013-09-28 DIAGNOSIS — Z418 Encounter for other procedures for purposes other than remedying health state: Secondary | ICD-10-CM

## 2013-09-28 DIAGNOSIS — L02419 Cutaneous abscess of limb, unspecified: Secondary | ICD-10-CM | POA: Diagnosis present

## 2013-09-28 DIAGNOSIS — L0390 Cellulitis, unspecified: Secondary | ICD-10-CM

## 2013-09-28 LAB — URINE MICROSCOPIC-ADD ON

## 2013-09-28 LAB — COMPREHENSIVE METABOLIC PANEL
ALT: 10 U/L (ref 0–53)
AST: 20 U/L (ref 0–37)
Albumin: 3.2 g/dL — ABNORMAL LOW (ref 3.5–5.2)
Alkaline Phosphatase: 53 U/L (ref 39–117)
BILIRUBIN TOTAL: 1 mg/dL (ref 0.3–1.2)
BUN: 18 mg/dL (ref 6–23)
CHLORIDE: 104 meq/L (ref 96–112)
CO2: 22 meq/L (ref 19–32)
Calcium: 9 mg/dL (ref 8.4–10.5)
Creatinine, Ser: 0.92 mg/dL (ref 0.50–1.35)
GFR calc Af Amer: 78 mL/min — ABNORMAL LOW (ref >90)
GFR, EST NON AFRICAN AMERICAN: 67 mL/min — AB (ref >90)
Glucose, Bld: 169 mg/dL — ABNORMAL HIGH (ref 70–99)
Potassium: 4 mEq/L (ref 3.7–5.3)
SODIUM: 142 meq/L (ref 137–147)
Total Protein: 6.7 g/dL (ref 6.0–8.3)

## 2013-09-28 LAB — CK: CK TOTAL: 53 U/L (ref 7–232)

## 2013-09-28 LAB — URINALYSIS, ROUTINE W REFLEX MICROSCOPIC
BILIRUBIN URINE: NEGATIVE
Glucose, UA: NEGATIVE mg/dL
KETONES UR: NEGATIVE mg/dL
NITRITE: NEGATIVE
PROTEIN: 30 mg/dL — AB
Specific Gravity, Urine: 1.018 (ref 1.005–1.030)
UROBILINOGEN UA: 0.2 mg/dL (ref 0.0–1.0)
pH: 5.5 (ref 5.0–8.0)

## 2013-09-28 LAB — CBC WITH DIFFERENTIAL/PLATELET
Basophils Absolute: 0 10*3/uL (ref 0.0–0.1)
Basophils Relative: 0 % (ref 0–1)
EOS ABS: 0 10*3/uL (ref 0.0–0.7)
EOS PCT: 0 % (ref 0–5)
HCT: 33.8 % — ABNORMAL LOW (ref 39.0–52.0)
HEMOGLOBIN: 11.1 g/dL — AB (ref 13.0–17.0)
LYMPHS ABS: 1.1 10*3/uL (ref 0.7–4.0)
Lymphocytes Relative: 10 % — ABNORMAL LOW (ref 12–46)
MCH: 32.1 pg (ref 26.0–34.0)
MCHC: 32.8 g/dL (ref 30.0–36.0)
MCV: 97.7 fL (ref 78.0–100.0)
MONOS PCT: 19 % — AB (ref 3–12)
Monocytes Absolute: 2.1 10*3/uL — ABNORMAL HIGH (ref 0.1–1.0)
Neutro Abs: 8.1 10*3/uL — ABNORMAL HIGH (ref 1.7–7.7)
Neutrophils Relative %: 72 % (ref 43–77)
PLATELETS: 103 10*3/uL — AB (ref 150–400)
RBC: 3.46 MIL/uL — ABNORMAL LOW (ref 4.22–5.81)
RDW: 15.1 % (ref 11.5–15.5)
WBC: 11.3 10*3/uL — ABNORMAL HIGH (ref 4.0–10.5)

## 2013-09-28 LAB — CK TOTAL AND CKMB (NOT AT ARMC)
CK, MB: 5.9 ng/mL — ABNORMAL HIGH (ref 0.3–4.0)
CK, MB: 4.9 ng/mL — AB (ref 0.3–4.0)
RELATIVE INDEX: INVALID (ref 0.0–2.5)
RELATIVE INDEX: INVALID (ref 0.0–2.5)
Total CK: 60 U/L (ref 7–232)
Total CK: 58 U/L (ref 7–232)

## 2013-09-28 LAB — TROPONIN I
TROPONIN I: 0.87 ng/mL — AB (ref <0.30)
Troponin I: 1.42 ng/mL (ref <0.30)

## 2013-09-28 LAB — I-STAT TROPONIN, ED: TROPONIN I, POC: 0.35 ng/mL — AB (ref 0.00–0.08)

## 2013-09-28 LAB — MRSA PCR SCREENING: MRSA BY PCR: POSITIVE — AB

## 2013-09-28 LAB — GLUCOSE, CAPILLARY
Glucose-Capillary: 182 mg/dL — ABNORMAL HIGH (ref 70–99)
Glucose-Capillary: 185 mg/dL — ABNORMAL HIGH (ref 70–99)

## 2013-09-28 MED ORDER — PIPERACILLIN-TAZOBACTAM 3.375 G IVPB
3.3750 g | Freq: Three times a day (TID) | INTRAVENOUS | Status: DC
  Administered 2013-09-28 – 2013-09-29 (×2): 3.375 g via INTRAVENOUS
  Filled 2013-09-28 (×3): qty 50

## 2013-09-28 MED ORDER — CHLORHEXIDINE GLUCONATE CLOTH 2 % EX PADS
6.0000 | MEDICATED_PAD | Freq: Every day | CUTANEOUS | Status: AC
  Administered 2013-09-30 – 2013-10-03 (×4): 6 via TOPICAL

## 2013-09-28 MED ORDER — ACETAMINOPHEN 325 MG PO TABS
650.0000 mg | ORAL_TABLET | Freq: Four times a day (QID) | ORAL | Status: DC | PRN
  Administered 2013-09-28 – 2013-09-29 (×2): 650 mg via ORAL
  Filled 2013-09-28 (×2): qty 2

## 2013-09-28 MED ORDER — FAMOTIDINE 20 MG PO TABS
20.0000 mg | ORAL_TABLET | Freq: Every day | ORAL | Status: DC
  Administered 2013-09-28 – 2013-09-29 (×2): 20 mg via ORAL
  Filled 2013-09-28 (×2): qty 1

## 2013-09-28 MED ORDER — FLUCONAZOLE 150 MG PO TABS
150.0000 mg | ORAL_TABLET | Freq: Once | ORAL | Status: AC
  Administered 2013-09-28: 150 mg via ORAL
  Filled 2013-09-28: qty 1

## 2013-09-28 MED ORDER — ONDANSETRON HCL 4 MG PO TABS
4.0000 mg | ORAL_TABLET | Freq: Four times a day (QID) | ORAL | Status: DC | PRN
  Administered 2013-10-01: 4 mg via ORAL
  Filled 2013-09-28: qty 1

## 2013-09-28 MED ORDER — ALUM & MAG HYDROXIDE-SIMETH 200-200-20 MG/5ML PO SUSP: 30.0000 mL | Freq: Four times a day (QID) | ORAL | Status: DC | PRN

## 2013-09-28 MED ORDER — GLUCERNA SHAKE PO LIQD
237.0000 mL | Freq: Every day | ORAL | Status: DC
  Administered 2013-09-29: 237 mL via ORAL
  Filled 2013-09-28 (×2): qty 237

## 2013-09-28 MED ORDER — VANCOMYCIN HCL IN DEXTROSE 750-5 MG/150ML-% IV SOLN
750.0000 mg | Freq: Two times a day (BID) | INTRAVENOUS | Status: DC
  Administered 2013-09-29: 750 mg via INTRAVENOUS
  Filled 2013-09-28: qty 150

## 2013-09-28 MED ORDER — SODIUM CHLORIDE 0.9 % IJ SOLN
3.0000 mL | Freq: Two times a day (BID) | INTRAMUSCULAR | Status: DC
  Administered 2013-09-29 – 2013-10-07 (×6): 3 mL via INTRAVENOUS

## 2013-09-28 MED ORDER — VANCOMYCIN HCL 125 MG PO CAPS: 125.0000 mg | ORAL_CAPSULE | Freq: Every day | ORAL | Status: DC

## 2013-09-28 MED ORDER — SODIUM CHLORIDE 0.9 % IV BOLUS (SEPSIS)
2000.0000 mL | Freq: Once | INTRAVENOUS | Status: AC
  Administered 2013-09-28: 2000 mL via INTRAVENOUS

## 2013-09-28 MED ORDER — PIPERACILLIN-TAZOBACTAM IN DEX 2-0.25 GM/50ML IV SOLN: 2.2500 g | Freq: Three times a day (TID) | INTRAVENOUS | Status: DC

## 2013-09-28 MED ORDER — ASPIRIN 81 MG PO CHEW
324.0000 mg | CHEWABLE_TABLET | Freq: Once | ORAL | Status: AC
  Administered 2013-09-28: 324 mg via ORAL
  Filled 2013-09-28: qty 4

## 2013-09-28 MED ORDER — ACETAMINOPHEN 650 MG RE SUPP: 650.0000 mg | Freq: Four times a day (QID) | RECTAL | Status: DC | PRN

## 2013-09-28 MED ORDER — VANCOMYCIN HCL 10 G IV SOLR
1250.0000 mg | INTRAVENOUS | Status: AC
  Administered 2013-09-28: 1250 mg via INTRAVENOUS
  Filled 2013-09-28: qty 1250

## 2013-09-28 MED ORDER — FINASTERIDE 5 MG PO TABS
5.0000 mg | ORAL_TABLET | Freq: Every day | ORAL | Status: DC
  Administered 2013-09-29 – 2013-10-06 (×8): 5 mg via ORAL
  Filled 2013-09-28 (×10): qty 1

## 2013-09-28 MED ORDER — ADULT MULTIVITAMIN W/MINERALS CH: 1.0000 | ORAL_TABLET | Freq: Every day | ORAL | Status: DC

## 2013-09-28 MED ORDER — MIRTAZAPINE 15 MG PO TABS
15.0000 mg | ORAL_TABLET | Freq: Every day | ORAL | Status: DC
  Administered 2013-09-29 – 2013-10-06 (×8): 15 mg via ORAL
  Filled 2013-09-28 (×10): qty 1

## 2013-09-28 MED ORDER — FLUCONAZOLE 100MG IVPB
100.0000 mg | INTRAVENOUS | Status: DC
  Filled 2013-09-28: qty 50

## 2013-09-28 MED ORDER — ADULT MULTIVITAMIN W/MINERALS CH
1.0000 | ORAL_TABLET | Freq: Every morning | ORAL | Status: DC
  Administered 2013-09-29 – 2013-10-07 (×9): 1 via ORAL
  Filled 2013-09-28 (×9): qty 1

## 2013-09-28 MED ORDER — HYDROCODONE-ACETAMINOPHEN 5-325 MG PO TABS
1.0000 | ORAL_TABLET | Freq: Four times a day (QID) | ORAL | Status: DC | PRN
  Administered 2013-10-01 – 2013-10-03 (×3): 1 via ORAL
  Filled 2013-09-28 (×3): qty 1

## 2013-09-28 MED ORDER — SODIUM CHLORIDE 0.9 % IV SOLN
INTRAVENOUS | Status: DC
  Administered 2013-09-29 – 2013-09-30 (×4): via INTRAVENOUS

## 2013-09-28 MED ORDER — ASPIRIN EC 81 MG PO TBEC
81.0000 mg | DELAYED_RELEASE_TABLET | Freq: Every day | ORAL | Status: DC
  Administered 2013-09-29 – 2013-10-07 (×9): 81 mg via ORAL
  Filled 2013-09-28 (×10): qty 1

## 2013-09-28 MED ORDER — TAMSULOSIN HCL 0.4 MG PO CAPS
0.4000 mg | ORAL_CAPSULE | Freq: Every day | ORAL | Status: DC
  Administered 2013-09-28 – 2013-10-06 (×9): 0.4 mg via ORAL
  Filled 2013-09-28 (×10): qty 1

## 2013-09-28 MED ORDER — HEPARIN SODIUM (PORCINE) 5000 UNIT/ML IJ SOLN
5000.0000 [IU] | Freq: Three times a day (TID) | INTRAMUSCULAR | Status: DC
  Administered 2013-09-28 – 2013-09-29 (×2): 5000 [IU] via SUBCUTANEOUS
  Filled 2013-09-28 (×5): qty 1

## 2013-09-28 MED ORDER — ATORVASTATIN CALCIUM 10 MG PO TABS
10.0000 mg | ORAL_TABLET | Freq: Every day | ORAL | Status: DC
Start: 2013-09-28 — End: 2013-10-07
  Administered 2013-09-29 – 2013-10-06 (×8): 10 mg via ORAL
  Filled 2013-09-28 (×10): qty 1

## 2013-09-28 MED ORDER — MUPIROCIN 2 % EX OINT
1.0000 "application " | TOPICAL_OINTMENT | Freq: Two times a day (BID) | CUTANEOUS | Status: AC
  Administered 2013-09-28 – 2013-10-03 (×10): 1 via NASAL
  Filled 2013-09-28 (×2): qty 22

## 2013-09-28 MED ORDER — SENNOSIDES-DOCUSATE SODIUM 8.6-50 MG PO TABS: 1.0000 | ORAL_TABLET | Freq: Every evening | ORAL | Status: DC | PRN

## 2013-09-28 MED ORDER — ONDANSETRON HCL 4 MG/2ML IJ SOLN
4.0000 mg | Freq: Four times a day (QID) | INTRAMUSCULAR | Status: DC | PRN
  Administered 2013-09-28: 4 mg via INTRAVENOUS
  Filled 2013-09-28: qty 2

## 2013-09-28 MED ORDER — DEXTROSE 5 % IV SOLN
1.0000 g | Freq: Once | INTRAVENOUS | Status: AC
  Administered 2013-09-28: 1 g via INTRAVENOUS
  Filled 2013-09-28: qty 10

## 2013-09-28 MED ORDER — SACCHAROMYCES BOULARDII 250 MG PO CAPS
250.0000 mg | ORAL_CAPSULE | Freq: Two times a day (BID) | ORAL | Status: DC
  Administered 2013-09-29 – 2013-10-07 (×17): 250 mg via ORAL
  Filled 2013-09-28 (×19): qty 1

## 2013-09-28 MED ORDER — INSULIN ASPART 100 UNIT/ML ~~LOC~~ SOLN
0.0000 [IU] | Freq: Three times a day (TID) | SUBCUTANEOUS | Status: DC
  Administered 2013-09-28 – 2013-09-29 (×4): 2 [IU] via SUBCUTANEOUS
  Administered 2013-09-30: 7 [IU] via SUBCUTANEOUS

## 2013-09-28 MED ORDER — SODIUM CHLORIDE 0.9 % IV BOLUS (SEPSIS)
1000.0000 mL | Freq: Once | INTRAVENOUS | Status: AC
  Administered 2013-09-28: 1000 mL via INTRAVENOUS

## 2013-09-28 MED ORDER — LEVOTHYROXINE SODIUM 50 MCG PO TABS
50.0000 ug | ORAL_TABLET | Freq: Every day | ORAL | Status: DC
  Administered 2013-09-29 – 2013-10-07 (×9): 50 ug via ORAL
  Filled 2013-09-28 (×12): qty 1

## 2013-09-28 NOTE — Progress Notes (Signed)
ANTIBIOTIC CONSULT NOTE - INITIAL  Pharmacy Consult for Vancomycin Indication: sepsis  Allergies  Allergen Reactions  . Colestipol Other (See Comments)    "Blacked Out"   . Morphine And Related Other (See Comments)    Reaction: hallucinations    Patient Measurements:    Vital Signs: Temp: 98.4 F (36.9 C) (04/19 1137) Temp src: Rectal (04/19 1137) BP: 105/60 mmHg (04/19 1440) Pulse Rate: 104 (04/19 1442) Intake/Output from previous day:   Intake/Output from this shift: Total I/O In: -  Out: 90 [Urine:90]  Labs:  Recent Labs  09/28/13 1142  WBC 11.3*  HGB 11.1*  PLT 103*  CREATININE 0.92   The CrCl is unknown because both a height and weight (above a minimum accepted value) are required for this calculation. No results found for this basename: VANCOTROUGH, VANCOPEAK, VANCORANDOM, GENTTROUGH, GENTPEAK, GENTRANDOM, TOBRATROUGH, TOBRAPEAK, TOBRARND, AMIKACINPEAK, AMIKACINTROU, AMIKACIN,  in the last 72 hours   Microbiology: No results found for this or any previous visit (from the past 720 hour(s)).  Medical History: Past Medical History  Diagnosis Date  . Myocardial infarct   . Diabetes mellitus   . Hypertension   . CHF (congestive heart failure)   . Coronary artery disease   . Clostridium difficile diarrhea     recurrent   . Hyperlipidemia   . Pneumonia 04/2011  . Shortness of breath     only with my heart failure "  . Chronic kidney disease   . Dysrhythmia   . Paroxysmal atrial fibrillation   . DNR (do not resuscitate)      Assessment: 89 yoM with history of recurrent C.diff (completed course of tapered oral vancomycin on 4/15) presents with sepsis secondary to UTI and/or left leg cellulitis.  MD ordered Vancomycin and Zosyn for planned 7-10 treatment pending septic workup results.  Afebrile  WBC 11.3  SCr 0.92, CrCl~49 ml/min (CG), ~42 ml/min (normalized)  UA with leukocytes, CXR negative, blood and urine cultures ordered  C.diff PCR  ordered as well and will most likely be positive since recently treated for C.diff  Fluconazole x 7 days also ordered.  Goal of Therapy:  Vancomycin trough level 15-20 mcg/ml  Plan:  1.  Vancomycin 1250 mg IV x 1 then 750 mg IV q12h. 2.  Zosyn 3.375g IV q8h (4 hour infusion time). 3.  F/u SCr, trough levels, culture results, clinical course.  Maryanna Shape Aleida Crandell 09/28/2013,4:52 PM

## 2013-09-28 NOTE — Progress Notes (Signed)
[facility] hospitalist progress note. Chief complaint. Hypotension, elevated troponin. History of present illness. This 80 year old male in hospital with suspected urosepsis. He was noted to have an elevated point of care troponin in the emergency room 0.35. A followup troponin and has returned elevated 0.87. Notified by nursing that the patient's systolic blood pressure was in the 70s. Came to see the patient at bedside. He found him somnolent but easily arousable. Patient's daughter at the bedside as well. Physical exam. Vital signs temperature 99.3, pulse 3, respiration 29, blood pressure 83/46. O2 sats 97%. General appearance. Frail elderly male who is somnolent but arousable. No indication of distress. Cardiac. Rate and rhythm regular. Lungs. Diminished sounds with poor respiratory effort. No distress noted. Abdomen. Soft with positive bowel sounds. No pain with palpation. Impression/plan. Problem #1. Elevated troponin. I did discuss the case with Doctor [facility] cardiology who felt troponin likely elevated due to to sepsis/infection. He has no further recommendations from a cardiac standpoint. We'll continue to follow enzymes. Patient appears to be chest pain-free. Problem #2. Hypotension. Likely secondary to dehydration and sepsis. Will bolus the patient with normal saline and nursing will update me as needed.

## 2013-09-28 NOTE — ED Provider Notes (Addendum)
CSN: [id number]     Arrival date & time 09/28/13  1126 History   First MD Initiated Contact with Patient 09/28/13 1134     Chief Complaint  Patient presents with  . Weakness  . Fever  . Diarrhea     (Consider location/radiation/quality/duration/timing/severity/associated sxs/prior Treatment) HPI Complains of general weakness and generalized malaise for approximately 2 days. Son reports maximum temperature 100 this morning. Treated with Advil prior to coming here. No nausea no chest pain no shortness of breath no abdominal pain no headache. No diarrhea. Patient recently treated for Clostridium difficile after being treated with antibiotics for cellulitis. Vancomycin stopped last week no other associated symptoms. Nothing makes symptoms better or worse. His son also reports that his right leg is somewhat reddened similar cellulitis he's had in the past however improving with time. Patient was taken 2 falls over the past few days. No injury Past Medical History  Diagnosis Date  . Myocardial infarct   . Diabetes mellitus   . Hypertension   . CHF (congestive heart failure)   . Coronary artery disease   . Clostridium difficile diarrhea     recurrent   . Hyperlipidemia   . Pneumonia 04/2011  . Shortness of breath     only with my heart failure "  . Chronic kidney disease   . Dysrhythmia   . Paroxysmal atrial fibrillation   . DNR (do not resuscitate)    Past Surgical History  Procedure Laterality Date  . Coronary artery bypass graft    . Cholecystectomy    . Cardiac catheterization  10/10/2001  . Cystoscopy, turbt  05/09/2006, 02/19/2006, 04/19/2005, 04/21/2003  . Colonoscopy  01/31/2012    Procedure: COLONOSCOPY;  Surgeon: Iva Boop, MD;  Location: [facility];  Service: Endoscopy;  Laterality: N/A;  fecal transplant/cynthia snyder will prepare the sample   Family History  Problem Relation Age of Onset  . Stomach cancer Mother   . Coronary artery disease Father   . Diabetes Son    . Colon cancer Neg Hx    History  Substance Use Topics  . Smoking status: Former Smoker -- 1.00 packs/day    Types: Cigarettes    Quit date: 05/08/1974  . Smokeless tobacco: Never Used  . Alcohol Use: No     Comment: occasionally    Review of Systems  Constitutional: Positive for appetite change and fatigue.       Diminished appetite for several days  HENT: Negative.   Respiratory: Negative.   Cardiovascular: Negative.   Gastrointestinal: Negative.   Musculoskeletal: Negative.   Skin: Positive for rash.       Reddened right leg otherwise without rash  Neurological: Positive for weakness.       Generalized weakness  Psychiatric/Behavioral: Negative.   All other systems reviewed and are negative.     Allergies  Colestipol and Morphine and related  Home Medications   Prior to Admission medications   Medication Sig Start Date End Date Taking? Authorizing Provider  atorvastatin (LIPITOR) 10 MG tablet Take 10 mg by mouth at bedtime.    Yes Historical Provider, MD  finasteride (PROSCAR) 5 MG tablet Take 5 mg by mouth at bedtime.    Yes Historical Provider, MD  glipiZIDE (GLUCOTROL XL) 10 MG 24 hr tablet Take 10 mg by mouth daily with breakfast.   Yes Historical Provider, MD  GLUCERNA (GLUCERNA) LIQD Take 237 mLs by mouth daily.   Yes Historical Provider, MD  HYDROcodone-acetaminophen (NORCO/VICODIN) 5-325 MG per tablet Take  1 tablet by mouth every 6 (six) hours as needed for moderate pain.   Yes Historical Provider, MD  levothyroxine (SYNTHROID, LEVOTHROID) 50 MCG tablet Take 50 mcg by mouth daily before breakfast.   Yes Historical Provider, MD  mirtazapine (REMERON) 15 MG tablet Take 15 mg by mouth at bedtime.   Yes Historical Provider, MD  Multiple Vitamin (MULTIVITAMIN WITH MINERALS) TABS Take 1 tablet by mouth every morning.    Yes Historical Provider, MD  potassium chloride (K-DUR,KLOR-CON) 10 MEQ tablet Take 10 mEq by mouth every morning.    Yes Historical Provider, MD   tamsulosin (FLOMAX) 0.4 MG CAPS capsule Take 0.4 mg by mouth daily after supper.    Yes Historical Provider, MD  vancomycin (VANCOCIN) 125 MG capsule Take 125 mg by mouth daily. For 7 days started on 09-18-13; stop on 09-24-13   Yes Historical Provider, MD  zolpidem (AMBIEN) 10 MG tablet Take 10 mg by mouth at bedtime.  08/11/13  Yes Historical Provider, MD  vancomycin (VANCOCIN) 50 mg/mL oral solution Take 2.5 mLs (125 mg total) by mouth 3 (three) times daily. Reduce to bid, then qd then off weekly 08/28/13   Iva Boop, MD   BP 89/51  Pulse 101  Temp(Src) 98.4 F (36.9 C) (Rectal)  Resp 18  SpO2 92% Physical Exam  Nursing note and vitals reviewed. Constitutional: He is oriented to person, place, and time. He appears well-developed and well-nourished.  HENT:  Head: Normocephalic and atraumatic.  Mucous membranes dry  Eyes: Conjunctivae are normal. Pupils are equal, round, and reactive to light.  Neck: Neck supple. No tracheal deviation present. No thyromegaly present.  Cardiovascular: Regular rhythm.   No murmur heard. Mildly tachycardic  Pulmonary/Chest: Effort normal and breath sounds normal.  Abdominal: Soft. Bowel sounds are normal. He exhibits no distension. There is no tenderness.  Musculoskeletal: Normal range of motion. He exhibits no edema and no tenderness.  Neurological: He is alert and oriented to person, place, and time. Coordination normal.  Skin: Skin is warm and dry. No rash noted.  Mildly erythematous at right shin. Not tender. Skin is otherwise dry, warm, without rash. Neurovascular intact  Psychiatric: He has a normal mood and affect.   1:25 PM resting comfortable with eyes closed, easily arousable after treatment with intravenous fluids and aspirin ED Course  Procedures (including critical care time) Labs Review Labs Reviewed  CBC WITH DIFFERENTIAL - Abnormal; Notable for the following:    WBC 11.3 (*)    RBC 3.46 (*)    Hemoglobin 11.1 (*)    HCT 33.8 (*)     Platelets 103 (*)    Neutro Abs 8.1 (*)    Lymphocytes Relative 10 (*)    Monocytes Relative 19 (*)    Monocytes Absolute 2.1 (*)    All other components within normal limits  I-STAT TROPOININ, ED - Abnormal; Notable for the following:    Troponin i, poc 0.35 (*)    All other components within normal limits  COMPREHENSIVE METABOLIC PANEL  CK  URINALYSIS, ROUTINE W REFLEX MICROSCOPIC    Imaging Review No results found.   EKG Interpretation   Date/Time:  Sunday September 28 2013 11:36:52 EDT Ventricular Rate:  103 PR Interval:    QRS Duration: 166 QT Interval:  498 QTC Calculation: 652 R Axis:   2 Text Interpretation:  Junctional tachycardia Right bundle branch block  SINCE LAST TRACING HEART RATE HAS INCREASED Confirmed by Ethelda Chick  MD,  Jaquail Mclees [phone]) on 09/28/2013 11:48:09  AM      Results for orders placed during the hospital encounter of 09/28/13  COMPREHENSIVE METABOLIC PANEL      Result Value Ref Range   Sodium 142  137 - 147 mEq/L   Potassium 4.0  3.7 - 5.3 mEq/L   Chloride 104  96 - 112 mEq/L   CO2 22  19 - 32 mEq/L   Glucose, Bld 169 (*) 70 - 99 mg/dL   BUN 18  6 - 23 mg/dL   Creatinine, Ser 3.56  0.50 - 1.35 mg/dL   Calcium 9.0  8.4 - 86.1 mg/dL   Total Protein 6.7  6.0 - 8.3 g/dL   Albumin 3.2 (*) 3.5 - 5.2 g/dL   AST 20  0 - 37 U/L   ALT 10  0 - 53 U/L   Alkaline Phosphatase 53  39 - 117 U/L   Total Bilirubin 1.0  0.3 - 1.2 mg/dL   GFR calc non Af Amer 67 (*) >90 mL/min   GFR calc Af Amer 78 (*) >90 mL/min  CBC WITH DIFFERENTIAL      Result Value Ref Range   WBC 11.3 (*) 4.0 - 10.5 K/uL   RBC 3.46 (*) 4.22 - 5.81 MIL/uL   Hemoglobin 11.1 (*) 13.0 - 17.0 g/dL   HCT 68.3 (*) 72.9 - 02.1 %   MCV 97.7  78.0 - 100.0 fL   MCH 32.1  26.0 - 34.0 pg   MCHC 32.8  30.0 - 36.0 g/dL   RDW 11.5  52.0 - 80.2 %   Platelets 103 (*) 150 - 400 K/uL   Neutrophils Relative % 72  43 - 77 %   Neutro Abs 8.1 (*) 1.7 - 7.7 K/uL   Lymphocytes Relative 10 (*) 12 - 46 %    Lymphs Abs 1.1  0.7 - 4.0 K/uL   Monocytes Relative 19 (*) 3 - 12 %   Monocytes Absolute 2.1 (*) 0.1 - 1.0 K/uL   Eosinophils Relative 0  0 - 5 %   Eosinophils Absolute 0.0  0.0 - 0.7 K/uL   Basophils Relative 0  0 - 1 %   Basophils Absolute 0.0  0.0 - 0.1 K/uL  CK      Result Value Ref Range   Total CK 53  7 - 232 U/L  URINALYSIS, ROUTINE W REFLEX MICROSCOPIC      Result Value Ref Range   Color, Urine YELLOW  YELLOW   APPearance CLOUDY (*) CLEAR   Specific Gravity, Urine 1.018  1.005 - 1.030   pH 5.5  5.0 - 8.0   Glucose, UA NEGATIVE  NEGATIVE mg/dL   Hgb urine dipstick MODERATE (*) NEGATIVE   Bilirubin Urine NEGATIVE  NEGATIVE   Ketones, ur NEGATIVE  NEGATIVE mg/dL   Protein, ur 30 (*) NEGATIVE mg/dL   Urobilinogen, UA 0.2  0.0 - 1.0 mg/dL   Nitrite NEGATIVE  NEGATIVE   Leukocytes, UA LARGE (*) NEGATIVE  URINE MICROSCOPIC-ADD ON      Result Value Ref Range   Squamous Epithelial / LPF RARE  RARE   WBC, UA TOO NUMEROUS TO COUNT  <3 WBC/hpf   RBC / HPF 3-6  <3 RBC/hpf   Bacteria, UA RARE  RARE   Urine-Other RARE YEAST    I-STAT TROPOININ, ED      Result Value Ref Range   Troponin i, poc 0.35 (*) 0.00 - 0.08 ng/mL   Comment NOTIFIED PHYSICIAN     Comment 3  Dg Chest 1 View  09/05/2013   CLINICAL DATA:  Post fall, history of heart disease  EXAM: CHEST - 1 VIEW  COMPARISON:  DG CHEST 1V PORT dated 06/16/2013; DG CHEST 1V PORT dated 06/14/2013  FINDINGS: Grossly unchanged enlarged cardiac silhouette and mediastinal contours post median sternotomy and CABG. Overall improved aeration of the lungs with persistent perihilar and bibasilar opacities, left greater than right, likely atelectasis or scar. There is mild diffuse slightly nodular thickening of the pulmonary interstitium. No new focal airspace opacities. There is chronic blunting of the bilateral costophrenic angles without definite pleural effusion. No evidence of edema. No pneumothorax. No definite acute osseus  abnormalities. Surgical clips overlie the upper abdomen.  IMPRESSION: 1. Improved aeration of the lungs without acute cardiopulmonary disease. 2. Cardiomegaly and pulmonary venous congestion without definite evidence of edema.   Electronically Signed   By: Simonne Come M.D.   On: 09/05/2013 08:31   Dg Hip Complete Left  09/05/2013   CLINICAL DATA:  Left hip pain post fall  EXAM: LEFT HIP - COMPLETE 2+ VIEW  COMPARISON:  None  FINDINGS: Osseous demineralization.  Hip and SI joints symmetric and preserved.  No acute fracture, dislocation or bone destruction.  Degenerative disc disease changes lower lumbar spine.  IMPRESSION: No acute osseous abnormalities.   Electronically Signed   By: Ulyses Southward M.D.   On: 09/05/2013 08:18   Ct Pelvis Wo Contrast  09/05/2013   CLINICAL DATA:  Larey Seat this morning.  Left hip pain.  EXAM: CT PELVIS WITHOUT CONTRAST  TECHNIQUE: Multidetector CT imaging of the pelvis was performed following the standard protocol without intravenous contrast.  COMPARISON:  None.  FINDINGS: There is no hip fracture or dislocation. There is no fracture of the right or left superior and inferior pubic rami. There are mild osteoarthritic changes of bilateral hips. There are mild degenerative changes of bilateral SI joints.  There is degenerative disc disease at L4-5 and to lesser degree L5-S1. There is a broad-based moderate-sized disc osteophyte complex at L4-5 impressing upon the ventral thecal sac. There is severe bilateral facet arthropathy at L4-5 resulting in bilateral foraminal stenosis.  There is moderate bilateral facet arthropathy at L5-S1. There is a broad-based disc bulge with prominent foraminal components bilaterally resulting in foraminal stenosis.  There is no lytic or blastic osseous lesion. There is no fluid collection or hematoma.  There is a distal abdominal aortic aneurysm measuring 3.5 x 3.1 cm. The left common iliac artery measures 1.7 cm in short axis. The right common iliac artery  measures 1.8 cm in short axis.  Mild prostatic enlargement.  IMPRESSION: 1. No hip fracture or dislocation. 2. Lower lumbar spine spondylosis at L4-5 and L5-S1 as described above. 3. Distal abdominal aortic aneurysm measuring 3.5 x 3.1 cm which is incompletely visualized. 4. Mild prostatic enlargement. Correlate with physical exam and PSA. 5. Mild osteoarthritis of bilateral hips.   Electronically Signed   By: Elige Ko   On: 09/05/2013 09:11   Ct Hip Left Wo Contrast  09/05/2013   CLINICAL DATA:  Larey Seat this morning.  Left hip pain.  EXAM: CT OF THE LEFT HIP WITHOUT CONTRAST  TECHNIQUE: Multidetector CT imaging was performed according to the standard protocol. Multiplanar CT image reconstructions were also generated.  COMPARISON:  None.  FINDINGS: There is no hip fracture or dislocation. There is no fracture of the right or left superior and inferior pubic rami. There are mild osteoarthritic changes of bilateral hips. There are mild  degenerative changes of bilateral SI joints.  There is degenerative disc disease at L4-5 and to lesser degree L5-S1. There is a broad-based moderate-sized disc osteophyte complex at L4-5 impressing upon the ventral thecal sac. There is severe bilateral facet arthropathy at L4-5 resulting in bilateral foraminal stenosis.  There is moderate bilateral facet arthropathy at L5-S1. There is a broad-based disc bulge with prominent foraminal components bilaterally resulting in foraminal stenosis.  There is no lytic or blastic osseous lesion. There is no fluid collection or hematoma.  There is a distal abdominal aortic aneurysm measuring 3.5 x 3.1 cm. The left common iliac artery measures 1.7 cm in short axis. The right common iliac artery measures 1.8 cm in short axis.  Mild prostatic enlargement.  IMPRESSION: 1.  No acute osseous injury of the left hip.  2. Lower lumbar spine spondylosis at L4-5 and L5-S1 as described above.  3. Distal abdominal aortic aneurysm measuring 3.5 x 3.1 cm which  is incompletely visualized.  4. Mild prostatic enlargement. Correlate with physical exam and PSA.  5. Mild osteoarthritis of bilateral hips.   Electronically Signed   By: Elige KoHetal  Patel   On: 09/05/2013 09:12   Mr Hip Left Wo Contrast  09/05/2013   CLINICAL DATA:  Fall.  Severe left hip pain.  Fracture.  EXAM: MRI OF THE LEFT HIP WITHOUT CONTRAST  TECHNIQUE: Multiplanar, multisequence MR imaging was performed. No intravenous contrast was administered.  COMPARISON:  CT PELVIS W/O CM dated 09/05/2013  FINDINGS: There is no femoral neck fracture. Small bilateral hip effusions are present. Anasarca and ascites. Obturator rings appear intact. Severe right and moderate left hip osteoarthritis is present.  Visceral pelvis shows prostatomegaly. Mural thickening of the urinary bladder is present which may represent chronic bladder outflow obstruction or cystitis. Partially visualized severe lumbar spondylosis. Bone marrow edema in the inferior L4 endplate is probably discogenic and degenerative.  The gluteal tendons appear within normal limits. No bursitis is present. Mild symmetric fatty atrophy of gluteus muscles.  IMPRESSION: No acute osseous abnormality. Right greater than left hip osteoarthritis. The pelvic rings appear intact.   Electronically Signed   By: Andreas NewportGeoffrey  Lamke M.D.   On: 09/05/2013 11:49    Chest xray viewed by me  2 PM patient feels improved after treatment with intravenous fluids he is alert, appropriate MDM   Final diagnoses:  None   spoke with Dr.Ranga patient will be admitted step down unit in light of his hypotension and elevated troponin Elevated troponin may be reflective of NSTEMI, hypotension or severe illness. Plan urine culture, intravenous antibiotics, aspirin, supportive care Diagnosis #1 hypertension #2 weakness #3 NSTEMI #4 urinary tract infection #5 hyperglycemia #6 anemia CRITICAL CARE Performed by: Doug SouSam Dorthula Bier Total critical care time: 30 minute Critical care time  was exclusive of separately billable procedures and treating other patients. Critical care was necessary to treat or prevent imminent or life-threatening deterioration. Critical care was time spent personally by me on the following activities: development of treatment plan with patient and/or surrogate as well as nursing, discussions with consultants, evaluation of patient's response to treatment, examination of patient, obtaining history from patient or surrogate, ordering and performing treatments and interventions, ordering and review of laboratory studies, ordering and review of radiographic studies, pulse oximetry and re-evaluation of patient's condition.     Doug SouSam Preslea Rhodus, MD 09/28/13 1406  Doug SouSam Loralee Weitzman, MD 09/28/13 1407

## 2013-09-28 NOTE — Progress Notes (Signed)
Dr Venetia Constable returned call for elevated Troponin, will continue to monitor for now.

## 2013-09-28 NOTE — Progress Notes (Signed)
CRITICAL VALUE ALERT  Critical value received: Troponin 0.87  Date of notification: 09/28/2013  Time of notification:1840  Critical value read back yes  Nurse who received alert:S Madelin Rear RN  MD notified (1st page):Dr Ranga Time of first page:   MD notified (2nd page):  Time of second page:  Responding MD:   Time MD responded:

## 2013-09-28 NOTE — ED Notes (Signed)
Per EMS pt started feeling weak yesterday and having loose stools. Pt's family came to check on him today as he lives alone, and admin ibuprofen to pt due to running a fever.

## 2013-09-28 NOTE — H&P (Signed)
Patient Demographics  David Davidson, is a 80 y.o. male  MRN: [id number]   DOB - 1911-03-23  Admit Date - 09/28/2013  Outpatient Primary MD for the patient is [name], MD  Assessment David Davidson is a very pleasant 80 year old year old male with CAD s/p MI and CABG, chronic systolic heart failure with EF of 35%, severe aortic stenosis, paroxysmal atrial flutter, HTN, HLD, DM, CKD stage 3, recurrent C. Diff diarrhea(came off oral vancomycin 3 days ago), who presents with generalized weakness, falls at least twice in the last 24 hours, fever, dysuria and poor appetite. He appears to be septic as his blood pressure was in the 80s systolic at the time of presentation to the ED with heart rate around 105, white count 11,300, suggestion of UTI(also yeast in urine) which may be the source of the sepsis, and possible right leg cellulitis. Apparently, he started having loose bowel movements again in the last 3 days and this seems to be related to him finishing the course of oral vancomycin. It is therefore possible that there is recurrence of C. difficile colitis. His troponin was slightly elevated at 0.35 although CPK was normal. EKG showed junctional tachycardia. I do not see the added benefit of repeating an echocardiogram since one was done about 3 months ago.  Will admit him to the step down unit for close hemodynamic monitoring given hypotension and ejection fraction of 35%. Since the culprit for the current infectious etiology is not clear at this point, the choices being sepsis probably from a urinary source and/or other sources(?cellulitis of the left lower extremity) and recurrence of C. difficile colitis, we will pursue septic workup including urine culture/blood cultures, as well as stool C-diff PCR . Meanwhile, will continue on tapered dose of oral vancomycin for possible recurrence of the C. difficile colitis until stool studies have been performed and resulted, and keep patient on  contact isolation till then. Will also place patient on broad-spectrum antibiotics to cover for nosocomial organisms including MRSA/Pseudomonas given the fact that patient has had multiple hospitalizations in the last 90 days. Broad-spectrum antibiotics will be given mindful of the fact that patient's seems prone to get C. difficile colitis. The patient is DO NOT RESUSCITATE. This was confirmed by patient and his son at the bedside. In terms of long-term disposition, patient seems to be requiring more and more support for ADLS therefore might benefit from placement in a skilled nursing facility if he pulls out of the current health assault.  Plan Sepsis/UTI (lower urinary tract infection)/Left leg cellulitis/Hypotension  Admit SD U.  Septic workup  Gentle IVF mindful of CHF  Vancomycin/Zosyn to complete 7-10 days of antibiotics pending septic workup results Recurrent colitis due to Clostridium difficile  Contact isolation  C. difficile PCR  Continue tapered dose of oral vancomycin Troponin level elevated/CAD (coronary artery disease)/Paroxysmal a-fib/Hypertension/S/P CABG (coronary artery bypass graft)/ chronic systolic congestive heart failure EF 35%  Troponin elevation may be related to ongoing infection  BP low, hence antihypertensives on hold  Continue ASA  Serial cardiac enzymes  Beta blockers once patient out of sepsis? DM (diabetes mellitus), type 2, uncontrolled with complications/Hyperlipidemia/Hypothyroidism  No acute changes  Will place on SSI in house and hold glipizide for now  Continue Statin and Synthroid DVT/GI prophylaxis  Heparin subcutaneous  Pepcid    AM Labs Ordered, also please review Full Orders  Family Communication: Admission, patients condition and plan of care including tests being ordered have been discussed  with the patient and his son who indicate understanding and agree with the plan and Code Status.  Code Status DNR/DNI  Likely DC to   SNF  Condition GUARDED    Chief Complaint  Patient presents with  . Weakness  . Fever  . Diarrhea    HPI David Davidson  is a 80 y.o. male, who comes in with multiple complaints including generalized weakness, falls in the last 2 days, some diarrhea, chills and fever. He also mentions dysuria. He denies cough, shortness of breath or abdominal pain. He finished his course of oral vancomycin 3 days ago. He has noticed some erythema of the right leg.  Review of Systems   Unremarkable except as in the history of present illness..   Past Medical History  Diagnosis Date  . Myocardial infarct   . Diabetes mellitus   . Hypertension   . CHF (congestive heart failure)   . Coronary artery disease   . Clostridium difficile diarrhea     recurrent   . Hyperlipidemia   . Pneumonia 04/2011  . Shortness of breath     only with my heart failure "  . Chronic kidney disease   . Dysrhythmia   . Paroxysmal atrial fibrillation   . DNR (do not resuscitate)       Past Surgical History  Procedure Laterality Date  . Coronary artery bypass graft    . Cholecystectomy    . Cardiac catheterization  10/10/2001  . Cystoscopy, turbt  05/09/2006, 02/19/2006, 04/19/2005, 04/21/2003  . Colonoscopy  01/31/2012    Procedure: COLONOSCOPY;  Surgeon: Iva Boop, MD;  Location: [facility];  Service: Endoscopy;  Laterality: N/A;  fecal transplant/cynthia snyder will prepare the sample        Social History History  Substance Use Topics  . Smoking status: Former Smoker -- 1.00 packs/day    Types: Cigarettes    Quit date: 05/08/1974  . Smokeless tobacco: Never Used  . Alcohol Use: No     Comment: occasionally    Lives by himself but his son stays close by.   Family History Family History  Problem Relation Age of Onset  . Stomach cancer Mother   . Coronary artery disease Father   . Diabetes Son   . Colon cancer Neg Hx     Prior to Admission medications   Medication Sig Start Date End Date  Taking? Authorizing Provider  atorvastatin (LIPITOR) 10 MG tablet Take 10 mg by mouth at bedtime.    Yes Historical Provider, MD  finasteride (PROSCAR) 5 MG tablet Take 5 mg by mouth at bedtime.    Yes Historical Provider, MD  glipiZIDE (GLUCOTROL XL) 10 MG 24 hr tablet Take 10 mg by mouth daily with breakfast.   Yes Historical Provider, MD  GLUCERNA (GLUCERNA) LIQD Take 237 mLs by mouth daily.   Yes Historical Provider, MD  HYDROcodone-acetaminophen (NORCO/VICODIN) 5-325 MG per tablet Take 1 tablet by mouth every 6 (six) hours as needed for moderate pain.   Yes Historical Provider, MD  levothyroxine (SYNTHROID, LEVOTHROID) 50 MCG tablet Take 50 mcg by mouth daily before breakfast.   Yes Historical Provider, MD  mirtazapine (REMERON) 15 MG tablet Take 15 mg by mouth at bedtime.   Yes Historical Provider, MD  Multiple Vitamin (MULTIVITAMIN WITH MINERALS) TABS Take 1 tablet by mouth every morning.    Yes Historical Provider, MD  potassium chloride (K-DUR,KLOR-CON) 10 MEQ tablet Take 10 mEq by mouth every morning.    Yes Historical Provider,  MD  tamsulosin (FLOMAX) 0.4 MG CAPS capsule Take 0.4 mg by mouth daily after supper.    Yes Historical Provider, MD  vancomycin (VANCOCIN) 125 MG capsule Take 125 mg by mouth daily. For 7 days started on 09-18-13; stop on 09-24-13   Yes Historical Provider, MD  zolpidem (AMBIEN) 10 MG tablet Take 10 mg by mouth at bedtime.  08/11/13  Yes Historical Provider, MD  vancomycin (VANCOCIN) 50 mg/mL oral solution Take 2.5 mLs (125 mg total) by mouth 3 (three) times daily. Reduce to bid, then qd then off weekly 08/28/13   Iva Boop, MD    Allergies  Allergen Reactions  . Colestipol Other (See Comments)    "Blacked Out"   . Morphine And Related Other (See Comments)    Reaction: hallucinations    Physical Exam  Vitals  Blood pressure 105/60, pulse 104, temperature 98.4 F (36.9 C), temperature source Rectal, resp. rate 23, SpO2 93.00%.   1. General  lying in  bed in NAD,   ill-looking  2. Normal affect and insight, Not Suicidal or Homicidal, Awake Alert, Oriented X 3.  3. No F.N deficits, ALL C.Nerves Intact, Strength 5/5 all 4 extremities, Sensation intact all 4 extremities, Plantars down going.  4. Ears and Eyes appear Normal, Conjunctivae clear, PERRLA. Moist Oral Mucosa.  5. Supple Neck, No JVD, No cervical lymphadenopathy appriciated, No Carotid Bruits.  6. Symmetrical Chest wall movement, Good air movement bilaterally, CTAB.  7. RRR, No Gallops, or Rubs but has precordial  Murmur, No Parasternal Heave.  8. Positive Bowel Sounds, Abdomen Soft, Non tender, No organomegaly appriciated,No rebound -guarding or rigidity.  9.  No Cyanosis, Normal Skin Turgor, No Skin Rash or Bruise.  10. Good muscle tone,  joints appear normal , no effusions, Normal ROM.  11. No Palpable Lymph Nodes in Neck or Axillae.Some erythema right leg with warmth to touch.    Data Review  CBC  Recent Labs Lab 09/28/13 1142  WBC 11.3*  HGB 11.1*  HCT 33.8*  PLT 103*  MCV 97.7  MCH 32.1  MCHC 32.8  RDW 15.1  LYMPHSABS 1.1  MONOABS 2.1*  EOSABS 0.0  BASOSABS 0.0   ------------------------------------------------------------------------------------------------------------------  Chemistries   Recent Labs Lab 09/28/13 1142  NA 142  K 4.0  CL 104  CO2 22  GLUCOSE 169*  BUN 18  CREATININE 0.92  CALCIUM 9.0  AST 20  ALT 10  ALKPHOS 53  BILITOT 1.0   ------------------------------------------------------------------------------------------------------------------ CrCl is unknown because both a height and weight (above a minimum accepted value) are required for this calculation. ------------------------------------------------------------------------------------------------------------------ No results found for this basename: TSH, T4TOTAL, FREET3, T3FREE, THYROIDAB,  in the last 72 hours   Coagulation profile No results found for this  basename: INR, PROTIME,  in the last 168 hours ------------------------------------------------------------------------------------------------------------------- No results found for this basename: DDIMER,  in the last 72 hours -------------------------------------------------------------------------------------------------------------------  Cardiac Enzymes No results found for this basename: CK, CKMB, TROPONINI, MYOGLOBIN,  in the last 168 hours ------------------------------------------------------------------------------------------------------------------ No components found with this basename: POCBNP,    ---------------------------------------------------------------------------------------------------------------  Urinalysis    Component Value Date/Time   COLORURINE YELLOW 09/28/2013 1159   APPEARANCEUR CLOUDY* 09/28/2013 1159   LABSPEC 1.018 09/28/2013 1159   PHURINE 5.5 09/28/2013 1159   GLUCOSEU NEGATIVE 09/28/2013 1159   HGBUR MODERATE* 09/28/2013 1159   BILIRUBINUR NEGATIVE 09/28/2013 1159   KETONESUR NEGATIVE 09/28/2013 1159   PROTEINUR 30* 09/28/2013 1159   UROBILINOGEN 0.2 09/28/2013 1159   NITRITE NEGATIVE 09/28/2013 1159  LEUKOCYTESUR LARGE* 09/28/2013 1159    ----------------------------------------------------------------------------------------------------------------  Imaging results:   Dg Chest 1 View  09/05/2013   CLINICAL DATA:  Post fall, history of heart disease  EXAM: CHEST - 1 VIEW  COMPARISON:  DG CHEST 1V PORT dated 06/16/2013; DG CHEST 1V PORT dated 06/14/2013  FINDINGS: Grossly unchanged enlarged cardiac silhouette and mediastinal contours post median sternotomy and CABG. Overall improved aeration of the lungs with persistent perihilar and bibasilar opacities, left greater than right, likely atelectasis or scar. There is mild diffuse slightly nodular thickening of the pulmonary interstitium. No new focal airspace opacities. There is chronic blunting of the  bilateral costophrenic angles without definite pleural effusion. No evidence of edema. No pneumothorax. No definite acute osseus abnormalities. Surgical clips overlie the upper abdomen.  IMPRESSION: 1. Improved aeration of the lungs without acute cardiopulmonary disease. 2. Cardiomegaly and pulmonary venous congestion without definite evidence of edema.   Electronically Signed   By: Simonne Come M.D.   On: 09/05/2013 08:31   Dg Hip Complete Left  09/05/2013   CLINICAL DATA:  Left hip pain post fall  EXAM: LEFT HIP - COMPLETE 2+ VIEW  COMPARISON:  None  FINDINGS: Osseous demineralization.  Hip and SI joints symmetric and preserved.  No acute fracture, dislocation or bone destruction.  Degenerative disc disease changes lower lumbar spine.  IMPRESSION: No acute osseous abnormalities.   Electronically Signed   By: Ulyses Southward M.D.   On: 09/05/2013 08:18   Ct Pelvis Wo Contrast  09/05/2013   CLINICAL DATA:  Larey Seat this morning.  Left hip pain.  EXAM: CT PELVIS WITHOUT CONTRAST  TECHNIQUE: Multidetector CT imaging of the pelvis was performed following the standard protocol without intravenous contrast.  COMPARISON:  None.  FINDINGS: There is no hip fracture or dislocation. There is no fracture of the right or left superior and inferior pubic rami. There are mild osteoarthritic changes of bilateral hips. There are mild degenerative changes of bilateral SI joints.  There is degenerative disc disease at L4-5 and to lesser degree L5-S1. There is a broad-based moderate-sized disc osteophyte complex at L4-5 impressing upon the ventral thecal sac. There is severe bilateral facet arthropathy at L4-5 resulting in bilateral foraminal stenosis.  There is moderate bilateral facet arthropathy at L5-S1. There is a broad-based disc bulge with prominent foraminal components bilaterally resulting in foraminal stenosis.  There is no lytic or blastic osseous lesion. There is no fluid collection or hematoma.  There is a distal abdominal  aortic aneurysm measuring 3.5 x 3.1 cm. The left common iliac artery measures 1.7 cm in short axis. The right common iliac artery measures 1.8 cm in short axis.  Mild prostatic enlargement.  IMPRESSION: 1. No hip fracture or dislocation. 2. Lower lumbar spine spondylosis at L4-5 and L5-S1 as described above. 3. Distal abdominal aortic aneurysm measuring 3.5 x 3.1 cm which is incompletely visualized. 4. Mild prostatic enlargement. Correlate with physical exam and PSA. 5. Mild osteoarthritis of bilateral hips.   Electronically Signed   By: Elige Ko   On: 09/05/2013 09:11   Ct Hip Left Wo Contrast  09/05/2013   CLINICAL DATA:  Larey Seat this morning.  Left hip pain.  EXAM: CT OF THE LEFT HIP WITHOUT CONTRAST  TECHNIQUE: Multidetector CT imaging was performed according to the standard protocol. Multiplanar CT image reconstructions were also generated.  COMPARISON:  None.  FINDINGS: There is no hip fracture or dislocation. There is no fracture of the right or left superior and inferior  pubic rami. There are mild osteoarthritic changes of bilateral hips. There are mild degenerative changes of bilateral SI joints.  There is degenerative disc disease at L4-5 and to lesser degree L5-S1. There is a broad-based moderate-sized disc osteophyte complex at L4-5 impressing upon the ventral thecal sac. There is severe bilateral facet arthropathy at L4-5 resulting in bilateral foraminal stenosis.  There is moderate bilateral facet arthropathy at L5-S1. There is a broad-based disc bulge with prominent foraminal components bilaterally resulting in foraminal stenosis.  There is no lytic or blastic osseous lesion. There is no fluid collection or hematoma.  There is a distal abdominal aortic aneurysm measuring 3.5 x 3.1 cm. The left common iliac artery measures 1.7 cm in short axis. The right common iliac artery measures 1.8 cm in short axis.  Mild prostatic enlargement.  IMPRESSION: 1.  No acute osseous injury of the left hip.  2.  Lower lumbar spine spondylosis at L4-5 and L5-S1 as described above.  3. Distal abdominal aortic aneurysm measuring 3.5 x 3.1 cm which is incompletely visualized.  4. Mild prostatic enlargement. Correlate with physical exam and PSA.  5. Mild osteoarthritis of bilateral hips.   Electronically Signed   By: Elige Ko   On: 09/05/2013 09:12   Mr Hip Left Wo Contrast  09/05/2013   CLINICAL DATA:  Fall.  Severe left hip pain.  Fracture.  EXAM: MRI OF THE LEFT HIP WITHOUT CONTRAST  TECHNIQUE: Multiplanar, multisequence MR imaging was performed. No intravenous contrast was administered.  COMPARISON:  CT PELVIS W/O CM dated 09/05/2013  FINDINGS: There is no femoral neck fracture. Small bilateral hip effusions are present. Anasarca and ascites. Obturator rings appear intact. Severe right and moderate left hip osteoarthritis is present.  Visceral pelvis shows prostatomegaly. Mural thickening of the urinary bladder is present which may represent chronic bladder outflow obstruction or cystitis. Partially visualized severe lumbar spondylosis. Bone marrow edema in the inferior L4 endplate is probably discogenic and degenerative.  The gluteal tendons appear within normal limits. No bursitis is present. Mild symmetric fatty atrophy of gluteus muscles.  IMPRESSION: No acute osseous abnormality. Right greater than left hip osteoarthritis. The pelvic rings appear intact.   Electronically Signed   By: Andreas Newport M.D.   On: 09/05/2013 11:49   Dg Chest Port 1 View  09/28/2013   CLINICAL DATA:  Hypotension. Weakness. Congestive heart failure. Pneumonia.  EXAM: PORTABLE CHEST - 1 VIEW  COMPARISON:  09/05/2013  FINDINGS: Cardiomegaly stable. Prior CABG again noted. Pulmonary interstitial prominence remains stable. No evidence of acute infiltrate or edema. No evidence of pleural effusion.  IMPRESSION: Stable cardiomegaly.  No active lung disease.   Electronically Signed   By: Myles Rosenthal M.D.   On: 09/28/2013 14:40         Archimedes Harold M.D on 09/28/2013 at 3:06 PM  Between 7am to 7pm - Pager - [phone]  After 7pm go to [email or web address] - password [email or web address]  And look for the night coverage person covering me after hours  [facility] Office  [phone]

## 2013-09-28 NOTE — ED Notes (Signed)
Bed: VV61 Expected date: 09/28/13 Expected time: 11:13 AM Means of arrival: Ambulance Comments: hypotension

## 2013-09-29 ENCOUNTER — Inpatient Hospital Stay (HOSPITAL_COMMUNITY): Payer: Medicare Other

## 2013-09-29 DIAGNOSIS — I959 Hypotension, unspecified: Secondary | ICD-10-CM

## 2013-09-29 DIAGNOSIS — I4891 Unspecified atrial fibrillation: Secondary | ICD-10-CM

## 2013-09-29 DIAGNOSIS — A419 Sepsis, unspecified organism: Secondary | ICD-10-CM

## 2013-09-29 DIAGNOSIS — J189 Pneumonia, unspecified organism: Secondary | ICD-10-CM

## 2013-09-29 DIAGNOSIS — R6520 Severe sepsis without septic shock: Secondary | ICD-10-CM

## 2013-09-29 DIAGNOSIS — R6521 Severe sepsis with septic shock: Secondary | ICD-10-CM

## 2013-09-29 DIAGNOSIS — N390 Urinary tract infection, site not specified: Secondary | ICD-10-CM

## 2013-09-29 LAB — CBC WITH DIFFERENTIAL/PLATELET
Basophils Absolute: 0 10*3/uL (ref 0.0–0.1)
Basophils Relative: 0 % (ref 0–1)
Eosinophils Absolute: 0 10*3/uL (ref 0.0–0.7)
Eosinophils Relative: 0 % (ref 0–5)
HCT: 33.5 % — ABNORMAL LOW (ref 39.0–52.0)
Hemoglobin: 11 g/dL — ABNORMAL LOW (ref 13.0–17.0)
Lymphocytes Relative: 10 % — ABNORMAL LOW (ref 12–46)
Lymphs Abs: 0.9 10*3/uL (ref 0.7–4.0)
MCH: 32.4 pg (ref 26.0–34.0)
MCHC: 32.8 g/dL (ref 30.0–36.0)
MCV: 98.5 fL (ref 78.0–100.0)
Monocytes Absolute: 1.5 10*3/uL — ABNORMAL HIGH (ref 0.1–1.0)
Monocytes Relative: 16 % — ABNORMAL HIGH (ref 3–12)
Neutro Abs: 6.7 10*3/uL (ref 1.7–7.7)
Neutrophils Relative %: 74 % (ref 43–77)
Platelets: 82 10*3/uL — ABNORMAL LOW (ref 150–400)
RBC: 3.4 MIL/uL — ABNORMAL LOW (ref 4.22–5.81)
RDW: 15.4 % (ref 11.5–15.5)
WBC Morphology: INCREASED
WBC: 9.1 10*3/uL (ref 4.0–10.5)

## 2013-09-29 LAB — COMPREHENSIVE METABOLIC PANEL
ALT: 10 U/L (ref 0–53)
AST: 20 U/L (ref 0–37)
Albumin: 2.6 g/dL — ABNORMAL LOW (ref 3.5–5.2)
Alkaline Phosphatase: 46 U/L (ref 39–117)
BUN: 20 mg/dL (ref 6–23)
CO2: 19 mEq/L (ref 19–32)
Calcium: 7.7 mg/dL — ABNORMAL LOW (ref 8.4–10.5)
Chloride: 112 mEq/L (ref 96–112)
Creatinine, Ser: 1.02 mg/dL (ref 0.50–1.35)
GFR calc Af Amer: 67 mL/min — ABNORMAL LOW (ref >90)
GFR calc non Af Amer: 58 mL/min — ABNORMAL LOW (ref >90)
Glucose, Bld: 173 mg/dL — ABNORMAL HIGH (ref 70–99)
Potassium: 3.6 mEq/L — ABNORMAL LOW (ref 3.7–5.3)
Sodium: 145 mEq/L (ref 137–147)
Total Bilirubin: 0.8 mg/dL (ref 0.3–1.2)
Total Protein: 6.1 g/dL (ref 6.0–8.3)

## 2013-09-29 LAB — TROPONIN I
TROPONIN I: 0.72 ng/mL — AB (ref <0.30)
Troponin I: 1.01 ng/mL (ref <0.30)

## 2013-09-29 LAB — URINE CULTURE

## 2013-09-29 LAB — GLUCOSE, CAPILLARY
Glucose-Capillary: 194 mg/dL — ABNORMAL HIGH (ref 70–99)
Glucose-Capillary: 165 mg/dL — ABNORMAL HIGH (ref 70–99)
Glucose-Capillary: 320 mg/dL — ABNORMAL HIGH (ref 70–99)

## 2013-09-29 LAB — PROTIME-INR
INR: 1.42 (ref 0.00–1.49)
Prothrombin Time: 17 seconds — ABNORMAL HIGH (ref 11.6–15.2)

## 2013-09-29 LAB — PHOSPHORUS: Phosphorus: 3.4 mg/dL (ref 2.3–4.6)

## 2013-09-29 LAB — MAGNESIUM: Magnesium: 1.8 mg/dL (ref 1.5–2.5)

## 2013-09-29 LAB — TSH: TSH: 0.874 u[IU]/mL (ref 0.350–4.500)

## 2013-09-29 LAB — CLOSTRIDIUM DIFFICILE BY PCR: Toxigenic C. Difficile by PCR: POSITIVE — AB

## 2013-09-29 LAB — LACTIC ACID, PLASMA: Lactic Acid, Venous: 3.5 mmol/L — ABNORMAL HIGH (ref 0.5–2.2)

## 2013-09-29 MED ORDER — SODIUM CHLORIDE 0.9 % IV BOLUS (SEPSIS)
500.0000 mL | Freq: Once | INTRAVENOUS | Status: AC
  Administered 2013-09-29: 500 mL via INTRAVENOUS

## 2013-09-29 MED ORDER — VANCOMYCIN 50 MG/ML ORAL SOLUTION
500.0000 mg | Freq: Four times a day (QID) | ORAL | Status: DC
  Administered 2013-09-29 – 2013-10-07 (×33): 500 mg via ORAL
  Filled 2013-09-29 (×39): qty 10

## 2013-09-29 MED ORDER — FLUCONAZOLE 200 MG PO TABS
200.0000 mg | ORAL_TABLET | Freq: Every day | ORAL | Status: DC
  Administered 2013-09-29 – 2013-10-01 (×3): 200 mg via ORAL
  Filled 2013-09-29 (×3): qty 1

## 2013-09-29 MED ORDER — PIPERACILLIN-TAZOBACTAM 3.375 G IVPB
3.3750 g | Freq: Three times a day (TID) | INTRAVENOUS | Status: DC
  Administered 2013-09-29 – 2013-10-02 (×10): 3.375 g via INTRAVENOUS
  Filled 2013-09-29 (×10): qty 50

## 2013-09-29 MED ORDER — HYDROCORTISONE NA SUCCINATE PF 100 MG IJ SOLR
50.0000 mg | Freq: Four times a day (QID) | INTRAMUSCULAR | Status: DC
  Administered 2013-09-29 – 2013-09-30 (×3): 50 mg via INTRAVENOUS
  Filled 2013-09-29 (×2): qty 1
  Filled 2013-09-29 (×3): qty 2
  Filled 2013-09-29 (×2): qty 1

## 2013-09-29 MED ORDER — SODIUM CHLORIDE 0.9 % IV SOLN: 750.0000 mL | INTRAVENOUS | Status: DC | PRN

## 2013-09-29 MED ORDER — HEPARIN BOLUS VIA INFUSION
4000.0000 [IU] | Freq: Once | INTRAVENOUS | Status: AC
  Administered 2013-09-29: 4000 [IU] via INTRAVENOUS
  Filled 2013-09-29: qty 4000

## 2013-09-29 MED ORDER — VANCOMYCIN HCL IN DEXTROSE 750-5 MG/150ML-% IV SOLN
750.0000 mg | Freq: Two times a day (BID) | INTRAVENOUS | Status: DC
  Administered 2013-09-29 – 2013-10-02 (×6): 750 mg via INTRAVENOUS
  Filled 2013-09-29 (×7): qty 150

## 2013-09-29 MED ORDER — ACETAMINOPHEN 325 MG PO TABS
325.0000 mg | ORAL_TABLET | ORAL | Status: DC | PRN
  Administered 2013-09-29 – 2013-10-05 (×3): 325 mg via ORAL
  Filled 2013-09-29 (×3): qty 1

## 2013-09-29 MED ORDER — SODIUM CHLORIDE 0.9 % IV SOLN
Freq: Once | INTRAVENOUS | Status: AC
  Administered 2013-09-29: 14:00:00 via INTRAVENOUS

## 2013-09-29 MED ORDER — ACETAMINOPHEN 650 MG RE SUPP: 650.0000 mg | Freq: Four times a day (QID) | RECTAL | Status: DC | PRN

## 2013-09-29 MED ORDER — METRONIDAZOLE IN NACL 5-0.79 MG/ML-% IV SOLN
500.0000 mg | Freq: Three times a day (TID) | INTRAVENOUS | Status: DC
  Administered 2013-09-29 – 2013-10-02 (×10): 500 mg via INTRAVENOUS
  Filled 2013-09-29 (×11): qty 100

## 2013-09-29 MED ORDER — CEPHALEXIN 500 MG PO CAPS
500.0000 mg | ORAL_CAPSULE | Freq: Four times a day (QID) | ORAL | Status: DC
  Administered 2013-09-29: 500 mg via ORAL
  Filled 2013-09-29 (×4): qty 1

## 2013-09-29 MED ORDER — GLUCERNA SHAKE PO LIQD
237.0000 mL | Freq: Three times a day (TID) | ORAL | Status: DC
  Administered 2013-09-29 – 2013-10-07 (×18): 237 mL via ORAL
  Filled 2013-09-29 (×29): qty 237

## 2013-09-29 MED ORDER — NOREPINEPHRINE BITARTRATE 1 MG/ML IJ SOLN
2.0000 ug/min | INTRAVENOUS | Status: AC
  Administered 2013-09-29: 6 ug/min via INTRAVENOUS
  Filled 2013-09-29: qty 4

## 2013-09-29 MED ORDER — HEPARIN (PORCINE) IN NACL 100-0.45 UNIT/ML-% IJ SOLN
1100.0000 [IU]/h | INTRAMUSCULAR | Status: DC
  Administered 2013-09-29: 1100 [IU]/h via INTRAVENOUS
  Filled 2013-09-29: qty 250

## 2013-09-29 MED ORDER — NOREPINEPHRINE BITARTRATE 1 MG/ML IJ SOLN
2.0000 ug/min | INTRAVENOUS | Status: DC
  Administered 2013-09-29: 12 ug/min via INTRAVENOUS
  Administered 2013-09-30 – 2013-10-01 (×2): 2 ug/min via INTRAVENOUS
  Filled 2013-09-29 (×3): qty 16

## 2013-09-29 NOTE — Progress Notes (Signed)
ANTIBIOTIC CONSULT NOTE - INITIAL  Pharmacy Consult for Vancomycin, Zosyn Indication: cellulitis, bacteremia  Allergies  Allergen Reactions  . Colestipol Other (See Comments)    "Blacked Out"   . Morphine And Related Other (See Comments)    Reaction: hallucinations    Patient Measurements: Height: 6' (182.9 cm) Weight: 194 lb 0.1 oz (88 kg) IBW/kg (Calculated) : 77.6  Vital Signs: Temp: 98.8 F (37.1 C) (04/20 1200) Temp src: Oral (04/20 1200) BP: 68/44 mmHg (04/20 1330) Pulse Rate: 104 (04/20 1330) Intake/Output from previous day: 04/19 0701 - 04/20 0700 In: 3059.2 [I.V.:559.2; IV Piggyback:2000] Out: 165 [Urine:165]  Labs:  Recent Labs  09/28/13 1142 09/29/13 0345  WBC 11.3* 9.1  HGB 11.1* 11.0*  PLT 103* 82*  CREATININE 0.92 1.02   Estimated Creatinine Clearance: 42.3 ml/min (by C-G formula based on Cr of 1.02). No results found for this basename: VANCOTROUGH, Leodis Binet, VANCORANDOM, GENTTROUGH, GENTPEAK, GENTRANDOM, TOBRATROUGH, TOBRAPEAK, TOBRARND, AMIKACINPEAK, AMIKACINTROU, AMIKACIN,  in the last 72 hours   Microbiology: Recent Results (from the past 720 hour(s))  CULTURE, BLOOD (ROUTINE X 2)     Status: None   Collection Time    09/28/13 11:42 AM      Result Value Ref Range Status   Specimen Description BLOOD LEFT ARM  5 ML IN [facility] BOTTLE   Final   Special Requests NONE   Final   Culture  Setup Time     Final   Value: 09/28/2013 21:24     Performed at [facility]   Culture     Final   Value: GRAM POSITIVE COCCI IN PAIRS     Note: Gram Stain Report Called to,Read Back By and Verified With: GAIL S@11 :51AM ON 09/29/13 BY DANTS     Performed at [facility]   Report Status PENDING   Incomplete  CLOSTRIDIUM DIFFICILE BY PCR     Status: Abnormal   Collection Time    09/28/13  4:59 PM      Result Value Ref Range Status   C difficile by pcr POSITIVE (*) NEGATIVE Final   Comment: CRITICAL RESULT CALLED TO, READ BACK BY AND VERIFIED WITH:      MAIN RN 9:40 09/29/13 ([email or web address])     Performed at [facility]  MRSA PCR SCREENING     Status: Abnormal   Collection Time    09/28/13  4:59 PM      Result Value Ref Range Status   MRSA by PCR POSITIVE (*) NEGATIVE Final   Comment:            The GeneXpert MRSA Assay (FDA     approved for NASAL specimens     only), is one component of a     comprehensive MRSA colonization     surveillance program. It is not     intended to diagnose MRSA     infection nor to guide or     monitor treatment for     MRSA infections.     RESULT CALLED TO, READ BACK BY AND VERIFIED WITH:     S.DILLON RN AT 1859 ON [id number] BY C.BONGEL  CULTURE, BLOOD (ROUTINE X 2)     Status: None   Collection Time    09/28/13  5:07 PM      Result Value Ref Range Status   Specimen Description BLOOD RIGHT ARM   Final   Special Requests BOTTLES DRAWN AEROBIC AND ANAEROBIC 9CC   Final   Culture  Setup Time  Final   Value: 09/28/2013 21:24     [facility]   Culture     Final   Value:        BLOOD CULTURE RECEIVED NO GROWTH TO DATE CULTURE WILL BE HELD FOR 5 DAYS BEFORE ISSUING A FINAL NEGATIVE REPORT     Performed at [facility]   Report Status PENDING   Incomplete    Medical History: Past Medical History  Diagnosis Date  . Myocardial infarct   . Diabetes mellitus   . Hypertension   . CHF (congestive heart failure)   . Coronary artery disease   . Clostridium difficile diarrhea     recurrent   . Hyperlipidemia   . Pneumonia 04/2011  . Shortness of breath     only with my heart failure "  . Chronic kidney disease   . Dysrhythmia   . Paroxysmal atrial fibrillation   . DNR (do not resuscitate)     Anti-infectives: 4/19 CTX x 1 4/19 >> Fluconazole >>  4/19 >> Vanc >>  4/19 >> Zosyn >> 4/20 >> cephalexin >> 4/20 4/20 >> vanc PO > 4/20 >> flagyl IV >>   Assessment: 89 yoM with history of recurrent C.diff (completed course of tapered oral vancomycin on 4/15)  presents with sepsis secondary to UTI and/or left leg cellulitis. Pharmacy initially consulted to dose Vancomycin and Zosyn which was de-escalated to cephalexin on 4/20 due to mild cellulitis case.  Blood cultures now growing GPC pairs and antibiotics broadened back to Vancomycin and Zosyn per pharmacy dosing.  He remains on fluconazole for funguria and oral vanc/IV metronidazole for recurrent Cdiff.  Tmax: Afebrile  WBC: 9.1  Renal: SCr 1.02, CrCl 42 CG   Goal of Therapy:  Vancomycin trough level 15-20 mcg/ml  Plan:   Zosyn 3.375g IV Q8H infused over 4hrs.  Vancomycin 750mg  IV q12h.  Measure Vanc trough at steady state.  Follow up renal fxn and culture results.  Lynann Beaverhristine Ashelyn Mccravy PharmD, BCPS Pager [phone] 09/29/2013 2:02 PM

## 2013-09-29 NOTE — Progress Notes (Signed)
ANTICOAGULATION CONSULT NOTE - Initial Consult  Pharmacy Consult for Heparin Indication: NSTEMI  Allergies  Allergen Reactions  . Colestipol Other (See Comments)    "Blacked Out"   . Morphine And Related Other (See Comments)    Reaction: hallucinations    Patient Measurements: Height: 6' (182.9 cm) Weight: 194 lb 0.1 oz (88 kg) IBW/kg (Calculated) : 77.6 Heparin Dosing Weight: 88 kg  Vital Signs: Temp: 98.8 F (37.1 C) (04/20 1200) Temp src: Oral (04/20 1200) BP: 67/44 mmHg (04/20 1445) Pulse Rate: 104 (04/20 1445)  Labs:  Recent Labs  09/28/13 1142 09/28/13 1750 09/28/13 2246 09/29/13 0345  HGB 11.1*  --   --  11.0*  HCT 33.8*  --   --  33.5*  PLT 103*  --   --  82*  LABPROT  --   --   --  17.0*  INR  --   --   --  1.42  CREATININE 0.92  --   --  1.02  CKTOTAL 53 60 58  --   CKMB  --  5.9* 4.9*  --   TROPONINI  --  0.87* 1.42*  --     Estimated Creatinine Clearance: 42.3 ml/min (by C-G formula based on Cr of 1.02).   Medical History: Past Medical History  Diagnosis Date  . Myocardial infarct   . Diabetes mellitus   . Hypertension   . CHF (congestive heart failure)   . Coronary artery disease   . Clostridium difficile diarrhea     recurrent   . Hyperlipidemia   . Pneumonia 04/2011  . Shortness of breath     only with my heart failure "  . Chronic kidney disease   . Dysrhythmia   . Paroxysmal atrial fibrillation   . DNR (do not resuscitate)     Medications:  Scheduled:  . aspirin EC  81 mg Oral Daily  . atorvastatin  10 mg Oral QHS  . Chlorhexidine Gluconate Cloth  6 each Topical Q0600  . feeding supplement (GLUCERNA SHAKE)  237 mL Oral TID WC  . finasteride  5 mg Oral QHS  . fluconazole  200 mg Oral Daily  . insulin aspart  0-9 Units Subcutaneous TID WC  . levothyroxine  50 mcg Oral QAC breakfast  . vancomycin  500 mg Oral 4 times per day   And  . metronidazole  500 mg Intravenous 3 times per day  . mirtazapine  15 mg Oral QHS  .  multivitamin with minerals  1 tablet Oral q morning - 10a  . mupirocin ointment  1 application Nasal BID  . piperacillin-tazobactam  3.375 g Intravenous 3 times per day  . saccharomyces boulardii  250 mg Oral BID  . sodium chloride  500 mL Intravenous Once  . sodium chloride  3 mL Intravenous Q12H  . tamsulosin  0.4 mg Oral QPC supper  . vancomycin  750 mg Intravenous Q12H   Infusions:  . sodium chloride 100 mL/hr at 09/29/13 0045    Assessment: 89 yoM admitted 4/19 with sepsis (UTI, cellulitis, bacteremia).  PMH significant for CAD s/p MI and CABG, systolic CHF with EF 35%, severe aortic stenosis, paroxysmal atrial flutter, HTN, HLD, DM, CKD.  Now concerning for NSTEMI with elevated troponins (0.87, 1.42).  History of thrombocytopenia is known (baseline ~ 80-110k).  Pharmacy is consulted to start heparin infusion.  Baseline coags:  INR 1.42  CBC: Hgb 11 (baseline 10-12), Plt 82.  No bleeding or complications are reported.  SCr 1.02,  CrCl ~ 42 ml/min.   Goal of Therapy:  Heparin level 0.3-0.7 units/ml Monitor platelets by anticoagulation protocol: Yes   Plan:   Give heparin 4000 units bolus IV x 1  Start heparin IV infusion at 1100 units/hr  Heparin level 8 hours after starting  Daily heparin level and CBC  Continue to monitor H&H and platelets  Lynann Beaverhristine Hiroto Saltzman PharmD, BCPS Pager [phone] 09/29/2013 3:23 PM

## 2013-09-29 NOTE — Procedures (Signed)
Central Venous Catheter Insertion Procedure Note HEWEY REDINGTON [id number] Aug 04, 1910  Procedure: Insertion of Central Venous Catheter Indications: Assessment of intravascular volume, Drug and/or fluid administration and Frequent blood sampling  Procedure Details Consent: Risks of procedure as well as the alternatives and risks of each were explained to the (patient/caregiver).  Consent for procedure obtained. Time Out: Verified patient identification, verified procedure, site/side was marked, verified correct patient position, special equipment/implants available, medications/allergies/relevent history reviewed, required imaging and test results available.  Performed Real time [place] used to ID and cannulate the vessel  Maximum sterile technique was used including antiseptics, cap, gloves, gown, hand hygiene, mask and sheet. Skin prep: Chlorhexidine; local anesthetic administered A antimicrobial bonded/coated triple lumen catheter was placed in the right internal jugular vein using the Seldinger technique.  Evaluation Blood flow good Complications: No apparent complications Patient did tolerate procedure well. Chest X-ray ordered to verify placement.  CXR: pending.  Simonne Martinet 09/29/2013, 4:27 PM  I was present and supervised procedure.  U/S used in placement.  Alyson Reedy, M.D. [facility] Pulmonary/Critical Care Medicine. Pager: [phone]. After hours pager: [phone].

## 2013-09-29 NOTE — Progress Notes (Signed)
[facility] PROGRESS NOTE  LARAY KAPUSCINSKI BZJ:[id number] DOB: 1911-05-06 DOA: 09/28/2013 PCP: [name], MD  Assessment/Plan: Active Problems:   Paroxysmal a-fib: Currently in normal sinus rhythm. Rate controlled.    DM (diabetes mellitus), type 2, uncontrolled with complications: Monitor CBGs, CBG stable    Hypertension: Currently hypotensive. See below    S/P CABG (coronary artery bypass graft)    Hyperlipidemia: Stable    Hypothyroidism: Continue Synthroid    Recurrent colitis due to Clostridium difficile: C. difficile cultures positive. Given his severe illness on by mouth vancomycin plus IV Flagyl.    Hypotension: Secondary septic shock. Treating with aggressive fluid boluses plus antibiotics   Septic shock: Principal problem. Patient needs criteria with positive Blood cultures growing out what preliminary looks to be strep plus hypertension, likely source is right lower extremity. Continue aggressive fluid rehydration. Patient is DO NOT RESUSCITATE. Have discussed with critical care and central line to be placed for pressor support   Non-ST elevated MI: Likely some demand ischemia given sepsis. Follow enzymes. Treat underlying infection. I discussed with cardiology and recommendation is as given.  Cycling troponins. Have discussed with pharmacy and will start heparin drip.    UTI (lower urinary tract infection) yeast growing out of urine. Continue Diflucan:     Cellulitis: Mild appearance, although with positive blood cultures, likely the source of his infection. On IV vancomycin and Zosyn.  Chronic systolic CHF (congestive heart failure):  monitoring strict input and output. Right now aggressively hydrated and perfusion and blood pressure. Later likely will need to diurese    Code Status: DO NOT RESUSCITATE  Family Communication: Daughter updated at the bedside.  Disposition Plan: Continue in step  down  Consultants:  Pharmacy    Procedures:  None  Antibiotics:  IV vancomycin 4/19-present  IV Zosyn 4/19-present  By mouth vancomycin 4/20-present  By mouth Keflex times one dose 4/20  IV Flagyl 4/20-present  Oral Diflucan 4/19-present  HPI/Subjective: Patient feeling weak. Diarrhea. Mildly nauseated. No appetite  Objective: Filed Vitals:   09/29/13 1415  BP: 76/41  Pulse: 103  Temp:   Resp: 22    Intake/Output Summary (Last 24 hours) at 09/29/13 1440 Last data filed at 09/29/13 1400  Gross per 24 hour  Intake 4559.17 ml  Output    165 ml  Net 4394.17 ml   Filed Weights   09/28/13 1700 09/29/13 0500  Weight: 85 kg (187 lb 6.3 oz) 88 kg (194 lb 0.1 oz)    Exam:   General:  Alert and oriented x3, no acute distress, fatigued  Cardiovascular: Regular rate and rhythm, S1-S2  Respiratory: Clear to auscultation bilaterally  Abdomen: Soft, nontender, nondistended, positive bowel sounds  Musculoskeletal: No clubbing or cyanosis, left lower extremity has chronic 1+ pitting edema from the knee down   Data Reviewed: Basic Metabolic Panel:  Recent Labs Lab 09/28/13 1142 09/29/13 0345  NA 142 145  K 4.0 3.6*  CL 104 112  CO2 22 19  GLUCOSE 169* 173*  BUN 18 20  CREATININE 0.92 1.02  CALCIUM 9.0 7.7*  MG  --  1.8  PHOS  --  3.4   Liver Function Tests:  Recent Labs Lab 09/28/13 1142 09/29/13 0345  AST 20 20  ALT 10 10  ALKPHOS 53 46  BILITOT 1.0 0.8  PROT 6.7 6.1  ALBUMIN 3.2* 2.6*   No results found for this basename: LIPASE, AMYLASE,  in the last 168 hours No results found for this basename: AMMONIA,  in the  last 168 hours CBC:  Recent Labs Lab 09/28/13 1142 09/29/13 0345  WBC 11.3* 9.1  NEUTROABS 8.1* 6.7  HGB 11.1* 11.0*  HCT 33.8* 33.5*  MCV 97.7 98.5  PLT 103* 82*   Cardiac Enzymes:  Recent Labs Lab 09/28/13 1142 09/28/13 1750 09/28/13 2246  CKTOTAL 53 60 58  CKMB  --  5.9* 4.9*  TROPONINI  --  0.87* 1.42*    BNP (last 3 results)  Recent Labs  05/16/13 1403 05/20/13 0606 06/14/13 1150  PROBNP 2658.0* 1575.0* 879.1*   CBG:  Recent Labs Lab 09/28/13 1656 09/28/13 2111 09/29/13 0736 09/29/13 1219  GLUCAP 182* 185* 194* 165*    Recent Results (from the past 240 hour(s))  CULTURE, BLOOD (ROUTINE X 2)     Status: None   Collection Time    09/28/13 11:42 AM      Result Value Ref Range Status   Specimen Description BLOOD LEFT ARM  5 [facility]   Final   Special Requests NONE   Final   Culture  Setup Time     Final   Value: 09/28/2013 21:24     Performed at [facility]   Culture     Final   Value: GRAM POSITIVE COCCI IN PAIRS     Note: Gram Stain Report Called to,Read Back By and Verified With: GAIL S@11 :51AM ON 09/29/13 BY DANTS     Performed at [facility]   Report Status PENDING   Incomplete  CLOSTRIDIUM DIFFICILE BY PCR     Status: Abnormal   Collection Time    09/28/13  4:59 PM      Result Value Ref Range Status   C difficile by pcr POSITIVE (*) NEGATIVE Final   Comment: CRITICAL RESULT CALLED TO, READ BACK BY AND VERIFIED WITH:     MAIN RN 9:40 09/29/13 ([email or web address])     Performed at [facility]  MRSA PCR SCREENING     Status: Abnormal   Collection Time    09/28/13  4:59 PM      Result Value Ref Range Status   MRSA by PCR POSITIVE (*) NEGATIVE Final   Comment:            The GeneXpert MRSA Assay (FDA     approved for NASAL specimens     only), is one component of a     comprehensive MRSA colonization     surveillance program. It is not     intended to diagnose MRSA     infection nor to guide or     monitor treatment for     MRSA infections.     RESULT CALLED TO, READ BACK BY AND VERIFIED WITH:     S.DILLON RN AT 1859 ON [id number] BY C.BONGEL  CULTURE, BLOOD (ROUTINE X 2)     Status: None   Collection Time    09/28/13  5:07 PM      Result Value Ref Range Status   Specimen Description BLOOD RIGHT ARM   Final   Special Requests  BOTTLES DRAWN AEROBIC AND ANAEROBIC [facility]9CC   Final   Culture  Setup Time     Final   Value: 09/28/2013 21:24     Performed at [facility]   Culture     Final   Value:        BLOOD CULTURE RECEIVED NO GROWTH TO DATE CULTURE WILL BE HELD FOR 5 DAYS BEFORE ISSUING A FINAL NEGATIVE REPORT  Performed at [facility]   Report Status PENDING   Incomplete     Studies: Dg Chest Port 1 View  09/28/2013   CLINICAL DATA:  Hypotension. Weakness. Congestive heart failure. Pneumonia.  EXAM: PORTABLE CHEST - 1 VIEW  COMPARISON:  09/05/2013  FINDINGS: Cardiomegaly stable. Prior CABG again noted. Pulmonary interstitial prominence remains stable. No evidence of acute infiltrate or edema. No evidence of pleural effusion.  IMPRESSION: Stable cardiomegaly.  No active lung disease.   Electronically Signed   By: Myles Rosenthal M.D.   On: 09/28/2013 14:40    Scheduled Meds: . aspirin EC  81 mg Oral Daily  . atorvastatin  10 mg Oral QHS  . Chlorhexidine Gluconate Cloth  6 each Topical Q0600  . feeding supplement (GLUCERNA SHAKE)  237 mL Oral TID WC  . finasteride  5 mg Oral QHS  . fluconazole  200 mg Oral Daily  . insulin aspart  0-9 Units Subcutaneous TID WC  . levothyroxine  50 mcg Oral QAC breakfast  . vancomycin  500 mg Oral 4 times per day   And  . metronidazole  500 mg Intravenous 3 times per day  . mirtazapine  15 mg Oral QHS  . multivitamin with minerals  1 tablet Oral q morning - 10a  . mupirocin ointment  1 application Nasal BID  . piperacillin-tazobactam  3.375 g Intravenous 3 times per day  . saccharomyces boulardii  250 mg Oral BID  . sodium chloride  3 mL Intravenous Q12H  . tamsulosin  0.4 mg Oral QPC supper  . vancomycin  750 mg Intravenous Q12H   Continuous Infusions: . sodium chloride 100 mL/hr at 09/29/13 1610    Active Problems:   CAD (coronary artery disease)   Paroxysmal a-fib   DM (diabetes mellitus), type 2, uncontrolled with complications   Hypertension   S/P  CABG (coronary artery bypass graft)   Hyperlipidemia   Hypothyroidism   Recurrent colitis due to Clostridium difficile   Hypotension   Sepsis   Troponin level elevated   UTI (lower urinary tract infection)   Cellulitis   Chronic systolic CHF (congestive heart failure)    Time spent: 35 minutes    Hollice Espy  [facility] Pager [phone] If 7PM-7AM, please contact night-coverage at [email or web address], password [facility] 09/29/2013, 2:40 PM  LOS: 1 day

## 2013-09-29 NOTE — Consult Note (Signed)
WOC wound consult note Reason for Consult: Moisture Associated skin damage to buttocks and scrotal area.  Wound type: Moisture Associated Skin damage Measurement: generalized to buttocks and scrotal area.  Wound bed: Pink and moist Drainage (amount, consistency, odor) Minimal, serous weeping.  No odor.  Periwound: Intact Dressing procedure/placement/frequency: Cleanse buttocks and scrotum and pat gently dry.  Apply Sensicare #3 barrier cream to buttocks and scrotum.  Apply twice daily and PRN bowel movements.  Will not follow at this time.  Please re-consult if needed.  Maple Hudson RN BSN CWON Pager [phone]

## 2013-09-29 NOTE — Consult Note (Signed)
PULMONARY / CRITICAL CARE MEDICINE   Name: David Davidson MRN: [id number] DOB: 1911/01/25    ADMISSION DATE:  09/28/2013 CONSULTATION DATE:  4/20  REFERRING MD :  Rito Ehrlich  PRIMARY SERVICE: [facility]   CHIEF COMPLAINT:  Septic shock  BRIEF PATIENT DESCRIPTION:  80 year old male admitted on 4/19 w/ recurrent PCM, RLE cellulitis and GPC bacteremia. Course complicated by what appeared to be demand ischemia and persistent shock state. PCCM asked to see on 4/20 for hypotension that did not respond to IVFs.   >pt is DNR. Agrees to central access and short term vaso-active gtts.   SIGNIFICANT EVENTS / STUDIES:    LINES / TUBES:   CULTURES: BCX2 4/19: GPC>>> cdiff PCR 4/19: positive  MRSA PCR 4/19: positive   ANTIBIOTICS: Flagyl 4/19>>> Zosyn 4/19>> vanc 4/19>>> Oral vanc 4/19>>>  HISTORY OF PRESENT ILLNESS:   80 year old year old male with CAD s/p MI and CABG, chronic systolic heart failure with EF of 35%, severe aortic stenosis, paroxysmal atrial flutter, HTN, HLD, DM, CKD stage 3, recurrent C. Diff diarrhea(came off oral vancomycin 3 days prior to admit), who presented 4/19 with generalized weakness, falls at least twice in the last 24 hours, fever, dysuria and poor appetite. Apparently, he started having loose bowel movements again in the last 3 days and this seems to be related to him finishing the course of oral vancomycin.  His troponin was slightly elevated at 0.35 although CPK was normal. EKG showed junctional tachycardia. He was admitted w/ working dx of recurrent sepsis. Felt due to recurrent C-diff, but also covered for hospital acquired orgs. His blood cultures did come back positive w/ GPC on 4/20. This was felt to be due to RLE cellulitis. He has had persistent hypotension since admit in spite of IVFs and abx. PCCM asked to consult given concern for septic shock.    PAST MEDICAL HISTORY :  Past Medical History  Diagnosis Date  . Myocardial infarct   . Diabetes mellitus    . Hypertension   . CHF (congestive heart failure)   . Coronary artery disease   . Clostridium difficile diarrhea     recurrent   . Hyperlipidemia   . Pneumonia 04/2011  . Shortness of breath     only with my heart failure "  . Chronic kidney disease   . Dysrhythmia   . Paroxysmal atrial fibrillation   . DNR (do not resuscitate)    Past Surgical History  Procedure Laterality Date  . Coronary artery bypass graft    . Cholecystectomy    . Cardiac catheterization  10/10/2001  . Cystoscopy, turbt  05/09/2006, 02/19/2006, 04/19/2005, 04/21/2003  . Colonoscopy  01/31/2012    Procedure: COLONOSCOPY;  Surgeon: Iva Boop, MD;  Location: [facility];  Service: Endoscopy;  Laterality: N/A;  fecal transplant/cynthia snyder will prepare the sample   Prior to Admission medications   Medication Sig Start Date End Date Taking? Authorizing Provider  atorvastatin (LIPITOR) 10 MG tablet Take 10 mg by mouth at bedtime.    Yes Historical Provider, MD  finasteride (PROSCAR) 5 MG tablet Take 5 mg by mouth at bedtime.    Yes Historical Provider, MD  glipiZIDE (GLUCOTROL XL) 10 MG 24 hr tablet Take 10 mg by mouth daily with breakfast.   Yes Historical Provider, MD  GLUCERNA (GLUCERNA) LIQD Take 237 mLs by mouth daily.   Yes Historical Provider, MD  HYDROcodone-acetaminophen (NORCO/VICODIN) 5-325 MG per tablet Take 1 tablet by mouth every 6 (  six) hours as needed for moderate pain.   Yes Historical Provider, MD  levothyroxine (SYNTHROID, LEVOTHROID) 50 MCG tablet Take 50 mcg by mouth daily before breakfast.   Yes Historical Provider, MD  mirtazapine (REMERON) 15 MG tablet Take 15 mg by mouth at bedtime.   Yes Historical Provider, MD  Multiple Vitamin (MULTIVITAMIN WITH MINERALS) TABS Take 1 tablet by mouth every morning.    Yes Historical Provider, MD  potassium chloride (K-DUR,KLOR-CON) 10 MEQ tablet Take 10 mEq by mouth every morning.    Yes Historical Provider, MD  tamsulosin (FLOMAX) 0.4 MG CAPS capsule  Take 0.4 mg by mouth daily after supper.    Yes Historical Provider, MD  vancomycin (VANCOCIN) 125 MG capsule Take 125 mg by mouth daily. For 7 days started on 09-18-13; stop on 09-24-13   Yes Historical Provider, MD  zolpidem (AMBIEN) 10 MG tablet Take 10 mg by mouth at bedtime.  08/11/13  Yes Historical Provider, MD  vancomycin (VANCOCIN) 50 mg/mL oral solution Take 2.5 mLs (125 mg total) by mouth 3 (three) times daily. Reduce to bid, then qd then off weekly 08/28/13   Iva Boop, MD   Allergies  Allergen Reactions  . Colestipol Other (See Comments)    "Blacked Out"   . Morphine And Related Other (See Comments)    Reaction: hallucinations    FAMILY HISTORY:  Family History  Problem Relation Age of Onset  . Stomach cancer Mother   . Coronary artery disease Father   . Diabetes Son   . Colon cancer Neg Hx    SOCIAL HISTORY:  reports that he quit smoking about 39 years ago. His smoking use included Cigarettes. He smoked 1.00 pack per day. He has never used smokeless tobacco. He reports that he does not drink alcohol or use illicit drugs.  REVIEW OF SYSTEMS:   Neg except for feeling fatigued and freq stools.  All other ROS neg   SUBJECTIVE:  No acute distress but not responding to IVF bolus  VITAL SIGNS: Temp:  [98.6 F (37 C)-99.3 F (37.4 C)] 98.8 F (37.1 C) (04/20 1200) Pulse Rate:  [97-108] 104 (04/20 1500) Resp:  [15-32] 25 (04/20 1515) BP: (53-125)/(22-73) 78/52 mmHg (04/20 1515) SpO2:  [94 %-100 %] 96 % (04/20 1515) Weight:  [85 kg (187 lb 6.3 oz)-88 kg (194 lb 0.1 oz)] 88 kg (194 lb 0.1 oz) (04/20 0500) HEMODYNAMICS:   VENTILATOR SETTINGS:   INTAKE / OUTPUT: Intake/Output     04/19 0701 - 04/20 0700 04/20 0701 - 04/21 0700   I.V. (mL/kg) 559.2 (6.4) 1400 (15.9)   Other 500    IV Piggyback 2000 1150   Total Intake(mL/kg) 3059.2 (34.8) 2550 (29)   Urine (mL/kg/hr) 165    Total Output 165     Net +2894.2 +2550        Urine Occurrence 1 x    Stool Occurrence 5  x 2 x     PHYSICAL EXAMINATION: General:  No acute distress  Neuro:  No focal def  HEENT:  [place], no JVD  Cardiovascular:  rrr Lungs:  Clear no accessory muscle use  Abdomen:  Soft, inc stool + bowel sounds  Musculoskeletal:  Intact  Skin:  RLE erythremic   LABS:  CBC  Recent Labs Lab 09/28/13 1142 09/29/13 0345  WBC 11.3* 9.1  HGB 11.1* 11.0*  HCT 33.8* 33.5*  PLT 103* 82*   Coag's  Recent Labs Lab 09/29/13 0345  INR 1.42   BMET  Recent  Labs Lab 09/28/13 1142 09/29/13 0345  NA 142 145  K 4.0 3.6*  CL 104 112  CO2 22 19  BUN 18 20  CREATININE 0.92 1.02  GLUCOSE 169* 173*   Electrolytes  Recent Labs Lab 09/28/13 1142 09/29/13 0345  CALCIUM 9.0 7.7*  MG  --  1.8  PHOS  --  3.4   Sepsis Markers No results found for this basename: LATICACIDVEN, PROCALCITON, O2SATVEN,  in the last 168 hours ABG No results found for this basename: PHART, PCO2ART, PO2ART,  in the last 168 hours Liver Enzymes  Recent Labs Lab 09/28/13 1142 09/29/13 0345  AST 20 20  ALT 10 10  ALKPHOS 53 46  BILITOT 1.0 0.8  ALBUMIN 3.2* 2.6*   Cardiac Enzymes  Recent Labs Lab 09/28/13 1750 09/28/13 2246  TROPONINI 0.87* 1.42*   Glucose  Recent Labs Lab 09/28/13 1656 09/28/13 2111 09/29/13 0736 09/29/13 1219  GLUCAP 182* 185* 194* 165*    Imaging Dg Chest Port 1 View  09/28/2013   CLINICAL DATA:  Hypotension. Weakness. Congestive heart failure. Pneumonia.  EXAM: PORTABLE CHEST - 1 VIEW  COMPARISON:  09/05/2013  FINDINGS: Cardiomegaly stable. Prior CABG again noted. Pulmonary interstitial prominence remains stable. No evidence of acute infiltrate or edema. No evidence of pleural effusion.  IMPRESSION: Stable cardiomegaly.  No active lung disease.   Electronically Signed   By: Myles Rosenthal M.D.   On: 09/28/2013 14:40     CXR: no acute disease   ASSESSMENT / PLAN:  PULMONARY A: no acute  P:   Cont pulse ox  O2 as needed   CARDIOVASCULAR A: Septic  shock NSTEMI vs demand ischemia >not a candidate, nor would he agree to anything more than medical management re: demand ischemia  Systolic heart failure. EF 35-40% H/o af. Currently NSR P:  Fluid challenge Pt considering central access. If agrees: CVP goal 8-12 MAP goal >65 Ck cortisol Add stress dose steroids He remains DNR, but is ok with pressors.   RENAL A:   No acute but at risk for AKI in setting of low perfusion state.  P:   MAP goal >65 Renal dose meds   GASTROINTESTINAL A:   PMC  P:   Adv diet as tol See ID section   HEMATOLOGIC A:   Anemia. Suspect hgb drift over last 24 hrs reflects hemodilution  P:  Monitor h&h w/ heparin    INFECTIOUS A:   Cellulitis w/ GPC bacteremia  C diff colitis  Septic shock due to above  P:   Cont empiric abx See above  ENDOCRINE A:   Dm w/ hyperglycemia  Hypothyroidism  P:   ssi syntrhoid   NEUROLOGIC A:  No acute  P:   Supportive care   TODAY'S SUMMARY:  Will place CVL. Assure volume resuscitated, try pressors short term. He is on appropriate abx so hope we can turn this around.   I have personally obtained a history, examined the patient, evaluated laboratory and imaging results, formulated the assessment and plan and placed orders.  CRITICAL CARE: The patient is critically ill with multiple organ systems failure and requires high complexity decision making for assessment and support, frequent evaluation and titration of therapies, application of advanced monitoring technologies and extensive interpretation of multiple databases. Critical Care Time devoted to patient care services described in this note is 45 minutes.   Alyson Reedy, M.D. [facility] Pulmonary/Critical Care Medicine. Pager: [phone]. After hours pager: [phone].

## 2013-09-29 NOTE — Progress Notes (Signed)
INITIAL NUTRITION ASSESSMENT  DOCUMENTATION CODES Per approved criteria  -Not Applicable   INTERVENTION: - Glucerna shakes TID - Encouraged increased meal intake to improve strength - Encouraged pt to consume 2 servings of yogurt today to promote gut flora  - Will continue to monitor   NUTRITION DIAGNOSIS: Altered GI function related to C. Dfificile as evidenced by PCR.   Goal: 1. Stools < 2 per day 2. Pt to consume >90% of meals/supplements  Monitor:  Weights, labs, BM, intake  Reason for Assessment: Low braden  80 y.o. male  Admitting Dx: Sepsis, urinary tract infection, left leg cellulitis, hypotension  ASSESSMENT: Pt with hx of CAD s/p MI and CABG, chronic systolic heart failure with EF of 35%, severe aortic stenosis, paroxysmal atrial flutter, HTN, HLD, DM, CKD stage 3, recurrent C. Diff diarrhea(came off oral vancomycin 3 days ago), who presents with generalized weakness, falls at least twice in the last 24 hours, fever, dysuria and poor appetite. Found to have sepsis, urinary tract infection, left leg cellulitis, hypotension, elevated troponin, and recurrent colitis due to C. Difficile.   Met with pt and family who report pt did not eat anything for 2 days PTA due to not feeling well and having loose stools, which is ongoing. Pt reports before then he was eating better, 3 meals/day - cereal/fruit for breakfast, salad for lunch, and well balanced dinner. Drank 1 Glucerna shake/day at home. Reports 25 pound unintended weight loss in the past 6 months due to having C. Difficile and sometimes not eating 3 meals/day. Pt getting Florastor.   Potassium low, getting multivitamin  Height: Ht Readings from Last 1 Encounters:  09/28/13 6' (1.829 m)    Weight: Wt Readings from Last 1 Encounters:  09/29/13 194 lb 0.1 oz (88 kg)    Ideal Body Weight: 178 lbs   % Ideal Body Weight: 109%  Wt Readings from Last 10 Encounters:  09/29/13 194 lb 0.1 oz (88 kg)  08/28/13 186 lb  2 oz (84.426 kg)  07/22/13 181 lb (82.101 kg)  06/19/13 196 lb 14.4 oz (89.313 kg)  05/23/13 193 lb 9 oz (87.8 kg)  05/17/13 212 lb 8.4 oz (96.4 kg)  03/18/13 210 lb (95.255 kg)  12/26/12 196 lb 8 oz (89.132 kg)  08/23/12 195 lb 12.3 oz (88.8 kg)  01/19/12 202 lb (91.627 kg)    Usual Body Weight: 219 lbs 6 months ago per pt report  % Usual Body Weight: 88%  BMI:  Body mass index is 26.31 kg/(m^2).  Estimated Nutritional Needs: Kcal: 2000-2200 Protein: 105-115g Fluid: 2-2.2L/day  Skin: +1 RLE, LLE edema  Diet Order: General  EDUCATION NEEDS: -No education needs identified at this time   Intake/Output Summary (Last 24 hours) at 09/29/13 1219 Last data filed at 09/29/13 0900  Gross per 24 hour  Intake 3409.17 ml  Output    165 ml  Net 3244.17 ml    Last BM: 4/20 diarrhea   Labs:   Recent Labs Lab 09/28/13 1142 09/29/13 0345  NA 142 145  K 4.0 3.6*  CL 104 112  CO2 22 19  BUN 18 20  CREATININE 0.92 1.02  CALCIUM 9.0 7.7*  MG  --  1.8  PHOS  --  3.4  GLUCOSE 169* 173*    CBG (last 3)   Recent Labs  09/28/13 1656 09/28/13 2111 09/29/13 0736  GLUCAP 182* 185* 194*    Scheduled Meds: . aspirin EC  81 mg Oral Daily  . atorvastatin  10  mg Oral QHS  . cephALEXin  500 mg Oral 4 times per day  . Chlorhexidine Gluconate Cloth  6 each Topical Q0600  . famotidine  20 mg Oral Daily  . feeding supplement (GLUCERNA SHAKE)  237 mL Oral Daily  . finasteride  5 mg Oral QHS  . fluconazole  200 mg Oral Daily  . heparin  5,000 Units Subcutaneous 3 times per day  . insulin aspart  0-9 Units Subcutaneous TID WC  . levothyroxine  50 mcg Oral QAC breakfast  . vancomycin  500 mg Oral 4 times per day   And  . metronidazole  500 mg Intravenous 3 times per day  . mirtazapine  15 mg Oral QHS  . multivitamin with minerals  1 tablet Oral q morning - 10a  . mupirocin ointment  1 application Nasal BID  . saccharomyces boulardii  250 mg Oral BID  . sodium chloride  3  mL Intravenous Q12H  . tamsulosin  0.4 mg Oral QPC supper    Continuous Infusions: . sodium chloride 100 mL/hr at 09/29/13 8871    Past Medical History  Diagnosis Date  . Myocardial infarct   . Diabetes mellitus   . Hypertension   . CHF (congestive heart failure)   . Coronary artery disease   . Clostridium difficile diarrhea     recurrent   . Hyperlipidemia   . Pneumonia 04/2011  . Shortness of breath     only with my heart failure "  . Chronic kidney disease   . Dysrhythmia   . Paroxysmal atrial fibrillation   . DNR (do not resuscitate)     Past Surgical History  Procedure Laterality Date  . Coronary artery bypass graft    . Cholecystectomy    . Cardiac catheterization  10/10/2001  . Cystoscopy, turbt  05/09/2006, 02/19/2006, 04/19/2005, 04/21/2003  . Colonoscopy  01/31/2012    Procedure: COLONOSCOPY;  Surgeon: Gatha Mayer, MD;  Location: [facility];  Service: Endoscopy;  Laterality: N/A;  fecal transplant/cynthia snyder will prepare the sample    [place], [place], [place] Pager [phone] After Hours Pager

## 2013-09-29 NOTE — Progress Notes (Signed)
CARE MANAGEMENT NOTE 09/29/2013  Patient:  David Davidson, David Davidson   Account Number:  [id number]  Date Initiated:  09/29/2013  Documentation initiated by:  DAVIS,RHONDA  Subjective/Objective Assessment:   80 year old with c.diff and hypotension, was given iv bolus in ed, currently being treated for c.diff sepsis     Action/Plan:   tbd may need snf placement/lives alone   Anticipated DC Date:  10/02/2013   Anticipated DC Plan:  SKILLED NURSING FACILITY  In-house referral  Clinical Social Worker  NA      DC Planning Services  NA      [facility] Choice  NA   Choice offered to / List presented to:  NA   DME arranged  NA      DME agency  NA     HH arranged  NA      HH agency  NA   Status of service:  In process, will continue to follow Medicare Important Message given?  NA - LOS <3 / Initial given by admissions (If response is "NO", the following Medicare IM given date Kassin will be blank) Date Medicare IM given:   Date Additional Medicare IM given:    Discharge Disposition:    Per UR Regulation:  Reviewed for med. necessity/level of care/duration of stay  If discussed at Long Length of Stay Meetings, dates discussed:    Comments:  [id number]/Rhonda Stark Jock, BSN, [place] [phone] Chart Reviewed for discharge and hospital needs. Discharge needs at time of review: None present will follow for needs. Review of patient progress due on [id number].

## 2013-09-30 DIAGNOSIS — A0472 Enterocolitis due to Clostridium difficile, not specified as recurrent: Secondary | ICD-10-CM

## 2013-09-30 DIAGNOSIS — I5043 Acute on chronic combined systolic (congestive) and diastolic (congestive) heart failure: Secondary | ICD-10-CM

## 2013-09-30 LAB — CORTISOL: Cortisol, Plasma: 21.6 ug/dL

## 2013-09-30 LAB — BASIC METABOLIC PANEL
BUN: 28 mg/dL — ABNORMAL HIGH (ref 6–23)
CO2: 19 mEq/L (ref 19–32)
Calcium: 7.6 mg/dL — ABNORMAL LOW (ref 8.4–10.5)
Chloride: 108 mEq/L (ref 96–112)
Creatinine, Ser: 1.21 mg/dL (ref 0.50–1.35)
GFR calc Af Amer: 55 mL/min — ABNORMAL LOW (ref >90)
GFR calc non Af Amer: 47 mL/min — ABNORMAL LOW (ref >90)
GLUCOSE: 293 mg/dL — AB (ref 70–99)
POTASSIUM: 3.6 meq/L — AB (ref 3.7–5.3)
SODIUM: 141 meq/L (ref 137–147)

## 2013-09-30 LAB — GLUCOSE, CAPILLARY
Glucose-Capillary: 178 mg/dL — ABNORMAL HIGH (ref 70–99)
Glucose-Capillary: 307 mg/dL — ABNORMAL HIGH (ref 70–99)
Glucose-Capillary: 230 mg/dL — ABNORMAL HIGH (ref 70–99)
Glucose-Capillary: 196 mg/dL — ABNORMAL HIGH (ref 70–99)

## 2013-09-30 LAB — CBC
HCT: 31 % — ABNORMAL LOW (ref 39.0–52.0)
Hemoglobin: 10.3 g/dL — ABNORMAL LOW (ref 13.0–17.0)
MCH: 32.5 pg (ref 26.0–34.0)
MCHC: 33.2 g/dL (ref 30.0–36.0)
MCV: 97.8 fL (ref 78.0–100.0)
Platelets: 72 K/uL — ABNORMAL LOW (ref 150–400)
RBC: 3.17 MIL/uL — ABNORMAL LOW (ref 4.22–5.81)
RDW: 15.2 % (ref 11.5–15.5)
WBC: 8 K/uL (ref 4.0–10.5)

## 2013-09-30 LAB — HEPARIN LEVEL (UNFRACTIONATED)
Heparin Unfractionated: 0.22 IU/mL — ABNORMAL LOW (ref 0.30–0.70)
Heparin Unfractionated: 0.15 [IU]/mL — ABNORMAL LOW (ref 0.30–0.70)
Heparin Unfractionated: 0.21 IU/mL — ABNORMAL LOW (ref 0.30–0.70)

## 2013-09-30 LAB — VANCOMYCIN, TROUGH: Vancomycin Tr: 15.3 ug/mL (ref 10.0–20.0)

## 2013-09-30 LAB — TROPONIN I: TROPONIN I: 1.26 ng/mL — AB (ref <0.30)

## 2013-09-30 MED ORDER — INSULIN ASPART 100 UNIT/ML ~~LOC~~ SOLN
0.0000 [IU] | Freq: Three times a day (TID) | SUBCUTANEOUS | Status: DC
Start: 2013-09-30 — End: 2013-10-07
  Administered 2013-09-30: 5 [IU] via SUBCUTANEOUS
  Administered 2013-09-30: 8 [IU] via SUBCUTANEOUS
  Administered 2013-10-01 (×3): 2 [IU] via SUBCUTANEOUS
  Administered 2013-10-02: 18:00:00 via SUBCUTANEOUS
  Administered 2013-10-02: 5 [IU] via SUBCUTANEOUS
  Administered 2013-10-02 – 2013-10-03 (×3): 3 [IU] via SUBCUTANEOUS
  Administered 2013-10-04: 5 [IU] via SUBCUTANEOUS
  Administered 2013-10-04 (×2): 3 [IU] via SUBCUTANEOUS
  Administered 2013-10-05: 2 [IU] via SUBCUTANEOUS
  Administered 2013-10-05: 5 [IU] via SUBCUTANEOUS
  Administered 2013-10-05 – 2013-10-06 (×2): 3 [IU] via SUBCUTANEOUS
  Administered 2013-10-07: 2 [IU] via SUBCUTANEOUS
  Administered 2013-10-07: 3 [IU] via SUBCUTANEOUS

## 2013-09-30 MED ORDER — INSULIN GLARGINE 100 UNIT/ML ~~LOC~~ SOLN
6.0000 [IU] | Freq: Every day | SUBCUTANEOUS | Status: DC
  Administered 2013-09-30 – 2013-10-07 (×8): 6 [IU] via SUBCUTANEOUS
  Filled 2013-09-30 (×8): qty 0.06

## 2013-09-30 MED ORDER — HEPARIN (PORCINE) IN NACL 100-0.45 UNIT/ML-% IJ SOLN
1250.0000 [IU]/h | INTRAMUSCULAR | Status: DC
  Filled 2013-09-30 (×2): qty 250

## 2013-09-30 MED ORDER — FUROSEMIDE 10 MG/ML IJ SOLN
40.0000 mg | Freq: Once | INTRAMUSCULAR | Status: AC
  Administered 2013-09-30: 40 mg via INTRAVENOUS
  Filled 2013-09-30: qty 4

## 2013-09-30 MED ORDER — HEPARIN (PORCINE) IN NACL 100-0.45 UNIT/ML-% IJ SOLN
1400.0000 [IU]/h | INTRAMUSCULAR | Status: DC
  Administered 2013-09-30: 1400 [IU]/h via INTRAVENOUS
  Filled 2013-09-30: qty 250

## 2013-09-30 MED ORDER — HEPARIN (PORCINE) IN NACL 100-0.45 UNIT/ML-% IJ SOLN
1150.0000 [IU]/h | INTRAMUSCULAR | Status: DC
  Administered 2013-09-30 (×2): 1150 [IU]/h via INTRAVENOUS
  Filled 2013-09-30 (×2): qty 250

## 2013-09-30 NOTE — Progress Notes (Signed)
ANTICOAGULATION CONSULT NOTE - Follow Up Consult  Pharmacy Consult for Heparin Indication: NSTEMI  Allergies  Allergen Reactions  . Colestipol Other (See Comments)    "Blacked Out"   . Morphine And Related Other (See Comments)    Reaction: hallucinations    Patient Measurements: Height: 6' (182.9 cm) Weight: 194 lb 0.1 oz (88 kg) IBW/kg (Calculated) : 77.6 Heparin Dosing Weight:   Vital Signs: Temp: 99 F (37.2 C) (04/21 0000) Temp src: Oral (04/21 0000) BP: 98/61 mmHg (04/20 2200) Pulse Rate: 105 (04/20 2200)  Labs:  Recent Labs  09/28/13 1142  09/28/13 1750 09/28/13 2246 09/29/13 0345 09/29/13 1542 09/29/13 2102 09/30/13 0140  HGB 11.1*  --   --   --  11.0*  --   --   --   HCT 33.8*  --   --   --  33.5*  --   --   --   PLT 103*  --   --   --  82*  --   --   --   LABPROT  --   --   --   --  17.0*  --   --   --   INR  --   --   --   --  1.42  --   --   --   HEPARINUNFRC  --   --   --   --   --   --   --  0.22*  CREATININE 0.92  --   --   --  1.02  --   --   --   CKTOTAL 53  --  60 58  --   --   --   --   CKMB  --   --  5.9* 4.9*  --   --   --   --   TROPONINI  --   < > 0.87* 1.42*  --  0.72* 1.01*  --   < > = values in this interval not displayed.  Estimated Creatinine Clearance: 42.3 ml/min (by C-G formula based on Cr of 1.02).   Medications:  Infusions:  . sodium chloride 100 mL/hr at 09/29/13 1536  . heparin    . norepinephrine (LEVOPHED) Adult infusion 12 mcg/min (09/29/13 2331)    Assessment: Patient with low heparin level. No issues per RN.  Goal of Therapy:  Heparin level 0.3-0.7 units/ml Monitor platelets by anticoagulation protocol: Yes   Plan:  Increase heparin to 1150 units/hr Recheck level at [address] 09/30/2013,2:36 AM

## 2013-09-30 NOTE — Progress Notes (Signed)
PHARMACY - HEPARIN (brief note)  Heparin infusing @ 1250 units/hr  Heparin level = 0.21 (goal 0.3-0.7)  No bleeding noted  Heparin level subtherapeutic  PLAN: Increase heparin to 1400 units/hr F/U heparin level in 8 hr (with AM labs)  Terrilee Files, PharmD 09/30/13 @ 21:20

## 2013-09-30 NOTE — Progress Notes (Signed)
PULMONARY / CRITICAL CARE MEDICINE   Name: David Davidson MRN: [id number] DOB: July 10, 1910    ADMISSION DATE:  09/28/2013 CONSULTATION DATE:  4/20  REFERRING MD :  Rito Ehrlich  PRIMARY SERVICE: [facility]   CHIEF COMPLAINT:  Septic shock  BRIEF PATIENT DESCRIPTION:  80 year old male admitted on 4/19 w/ recurrent PCM, RLE cellulitis and GPC bacteremia. Course complicated by what appeared to be demand ischemia and persistent shock state. PCCM asked to see on 4/20 for hypotension that did not respond to IVFs.   >pt is DNR. Agrees to central access and short term vaso-active gtts.   SIGNIFICANT EVENTS / STUDIES:    LINES / TUBES:   CULTURES: BCX2 4/19: GPC>>> cdiff PCR 4/19: positive  MRSA PCR 4/19: positive   ANTIBIOTICS: Flagyl 4/19>>> Zosyn 4/19>> vanc 4/19>>> Oral vanc 4/19>>>  SUBJECTIVE:  On pressors. No other changes    VITAL SIGNS: Temp:  [97.9 F (36.6 C)-99.2 F (37.3 C)] 99.2 F (37.3 C) (04/21 0400) Pulse Rate:  [102-109] 104 (04/21 0800) Resp:  [15-28] 20 (04/21 0800) BP: (62-124)/(33-83) 99/79 mmHg (04/21 0800) SpO2:  [83 %-100 %] 99 % (04/21 0800) Weight:  [91.7 kg (202 lb 2.6 oz)] 91.7 kg (202 lb 2.6 oz) (04/21 0422) Room air  HEMODYNAMICS: CVP:  [10 mmHg-13 mmHg] 10 mmHg VENTILATOR SETTINGS:   INTAKE / OUTPUT: Intake/Output     04/20 0701 - 04/21 0700 04/21 0701 - 04/22 0700   I.V. (mL/kg) 3254.9 (35.5)    Other     IV Piggyback 1650    Total Intake(mL/kg) 4904.9 (53.5)    Urine (mL/kg/hr) 400 (0.2)    Total Output 400     Net +4504.9          Urine Occurrence 3 x    Stool Occurrence 3 x      PHYSICAL EXAMINATION: General:  No acute distress  Neuro:  No focal def  HEENT:  [place], no JVD  Cardiovascular:  rrr Lungs:  Clear no accessory muscle use  Abdomen:  Soft, inc stool + bowel sounds  Musculoskeletal:  Intact  Skin:  RLE erythremic   LABS:  CBC  Recent Labs Lab 09/28/13 1142 09/29/13 0345  WBC 11.3* 9.1  HGB 11.1* 11.0*  HCT  33.8* 33.5*  PLT 103* 82*   Coag's  Recent Labs Lab 09/29/13 0345  INR 1.42   BMET  Recent Labs Lab 09/28/13 1142 09/29/13 0345 09/30/13 0400  NA 142 145 141  K 4.0 3.6* 3.6*  CL 104 112 108  CO2 22 19 19   BUN 18 20 28*  CREATININE 0.92 1.02 1.21  GLUCOSE 169* 173* 293*   Electrolytes  Recent Labs Lab 09/28/13 1142 09/29/13 0345 09/30/13 0400  CALCIUM 9.0 7.7* 7.6*  MG  --  1.8  --   PHOS  --  3.4  --    Sepsis Markers  Recent Labs Lab 09/29/13 1705  LATICACIDVEN 3.5*   ABG No results found for this basename: PHART, PCO2ART, PO2ART,  in the last 168 hours Liver Enzymes  Recent Labs Lab 09/28/13 1142 09/29/13 0345  AST 20 20  ALT 10 10  ALKPHOS 53 46  BILITOT 1.0 0.8  ALBUMIN 3.2* 2.6*   Cardiac Enzymes  Recent Labs Lab 09/29/13 1542 09/29/13 2102 09/30/13 0400  TROPONINI 0.72* 1.01* 1.26*   Glucose  Recent Labs Lab 09/28/13 1656 09/28/13 2111 09/29/13 0736 09/29/13 1219 09/29/13 1652 09/29/13 2101  GLUCAP 182* 185* 194* 165* 178* 320*  Imaging Dg Chest Port 1 View  09/29/2013   CLINICAL DATA:  Central line placement, history diabetes, hypertension, coronary artery disease post MI, CHF  EXAM: PORTABLE CHEST - 1 VIEW  COMPARISON:  Portable exam 1630 hr compared to 09/28/2013  FINDINGS: Right jugular central venous catheter tip projects over SVC near cavoatrial junction.  Enlargement of cardiac silhouette post CABG.  Pulmonary vascular congestion.  Bibasilar atelectasis.  No acute infiltrate, pleural effusion or pneumothorax.  Bones appear demineralized.  IMPRESSION: No pneumothorax following right jugular line placement.  Enlargement of cardiac silhouette with pulmonary vascular congestion post CABG.  Mild bibasilar atelectasis.   Electronically Signed   By: Ulyses SouthwardMark  Boles M.D.   On: 09/29/2013 16:50   Dg Chest Port 1 View  09/28/2013   CLINICAL DATA:  Hypotension. Weakness. Congestive heart failure. Pneumonia.  EXAM: PORTABLE CHEST - 1  VIEW  COMPARISON:  09/05/2013  FINDINGS: Cardiomegaly stable. Prior CABG again noted. Pulmonary interstitial prominence remains stable. No evidence of acute infiltrate or edema. No evidence of pleural effusion.  IMPRESSION: Stable cardiomegaly.  No active lung disease.   Electronically Signed   By: Myles RosenthalJohn  Stahl M.D.   On: 09/28/2013 14:40   Agree, vasc congestion but no infiltrates   CXR: no acute disease   ASSESSMENT / PLAN:  PULMONARY A: no acute  P:   Cont pulse ox  O2 as needed   CARDIOVASCULAR A: Septic shock NSTEMI vs demand ischemia >not a candidate, nor would he agree to anything more than medical management re: demand ischemia  Systolic heart failure. EF 35-40% H/o af. Currently NSR P:  See ID section  CVP goal 8-12 MAP goal >65, will cont levophed stress dose steroids, d/c cort >20 He remains DNR, but is ok with pressors.  Hep gtt per primary svc   RENAL A:   Rising Cr, borderline AKI,  in setting of organ hypoperfusion  P:   MAP goal >65 Renal dose meds   GASTROINTESTINAL A:   PMC  P:   Adv diet as tol See ID section   HEMATOLOGIC A:   Anemia. Suspect hgb drift over last 24 hrs reflects hemodilution  P:  Monitor h&h w/ heparin    INFECTIOUS A:   Cellulitis w/ GPC bacteremia  C diff colitis  Septic shock due to above  P:   Cont empiric abx See above  ENDOCRINE A:   Dm w/ hyperglycemia  Hypothyroidism  P:   Ssi, will stop solucortef this should help w/ hyperglycemia  synthroid  NEUROLOGIC A:  No acute  P:   Supportive care   TODAY'S SUMMARY: Looking a little better. Will cont to wean levophed. Hope to have it off by later today or tomorrow. Will change BP goal to SBP >90. Cont other measures as outlined. Hope to mobilize later today if hemodynamics remain stable.   I have personally obtained a history, examined the patient, evaluated laboratory and imaging results, formulated the assessment and plan and placed orders.  CRITICAL  CARE: The patient is critically ill with multiple organ systems failure and requires high complexity decision making for assessment and support, frequent evaluation and titration of therapies, application of advanced monitoring technologies and extensive interpretation of multiple databases. Critical Care Time devoted to patient care services described in this note is 35 minutes.   Alyson ReedyWesam G. Yacoub, M.D. [facility]. Pager: [phone]. After hours pager: [phone].

## 2013-09-30 NOTE — Progress Notes (Signed)
[id number]/Rhonda Davis, RN, BSN, CCM, [phone] Chart reviewed for update of needs and condition./  

## 2013-09-30 NOTE — Progress Notes (Signed)
ANTIBIOTIC CONSULT NOTE - FOLLOW UP  Pharmacy Consult for Vancomycin, Zosyn Indication: Cellulitis, bacteremia  Allergies  Allergen Reactions  . Colestipol Other (See Comments)    "Blacked Out"   . Morphine And Related Other (See Comments)    Reaction: hallucinations    Patient Measurements: Height: 6' (182.9 cm) Weight: 202 lb 2.6 oz (91.7 kg) IBW/kg (Calculated) : 77.6  Vital Signs: Temp: 98.3 F (36.8 C) (04/21 0800) Temp src: Axillary (04/21 0800) BP: 91/62 mmHg (04/21 1133) Pulse Rate: 106 (04/21 1133) Intake/Output from previous day: 04/20 0701 - 04/21 0700 In: 5227.9 [I.V.:3477.9; IV Piggyback:1750] Out: 400 [Urine:400]  Labs:  Recent Labs  09/28/13 1142 09/29/13 0345 09/30/13 0400 09/30/13 1100  WBC 11.3* 9.1  --  8.0  HGB 11.1* 11.0*  --  10.3*  PLT 103* 82*  --  72*  CREATININE 0.92 1.02 1.21  --    Estimated Creatinine Clearance: 35.6 ml/min (by C-G formula based on Cr of 1.21). No results found for this basename: VANCOTROUGH, Leodis BinetVANCOPEAK, VANCORANDOM, GENTTROUGH, GENTPEAK, GENTRANDOM, TOBRATROUGH, TOBRAPEAK, TOBRARND, AMIKACINPEAK, AMIKACINTROU, AMIKACIN,  in the last 72 hours   Microbiology: Recent Results (from the past 720 hour(s))  CULTURE, BLOOD (ROUTINE X 2)     Status: None   Collection Time    09/28/13 11:42 AM      Result Value Ref Range Status   Specimen Description BLOOD LEFT ARM  5 ML [facility]   Final   Special Requests NONE   Final   Culture  Setup Time     Final   Value: 09/28/2013 21:24     [facility]formed at [facility]   Culture     Final   Value: GRAM POSITIVE COCCI IN PAIRS     Note: Gram Stain Report Called to,Read Back By and Verified With: GAIL S@11 :51AM ON 09/29/13 BY DANTS     Performed at [facility]   Report Status PENDING   Incomplete  URINE CULTURE     Status: None   Collection Time    09/28/13 11:59 AM      Result Value Ref Range Status   Specimen Description URINE, RANDOM   Final   Special  Requests Immunocompromised   Final   Culture  Setup Time     Final   Value: 09/28/2013 19:47     [facility]formed at [facility]   Colony Count     Final   Value: 15,000 COLONIES/ML     Performed at [facility]   Culture     Final   Value: Multiple bacterial morphotypes present, none predominant. Suggest appropriate recollection if clinically indicated.     Performed at [facility]   Report Status 09/29/2013 FINAL   Final  CLOSTRIDIUM DIFFICILE BY PCR     Status: Abnormal   Collection Time    09/28/13  4:59 PM      Result Value Ref Range Status   C difficile by pcr POSITIVE (*) NEGATIVE Final   Comment: CRITICAL RESULT CALLED TO, READ BACK BY AND VERIFIED WITH:     MAIN RN 9:40 09/29/13 ([email or web address])     Performed [facility]  MRSA PCR SCREENING     Status: Abnormal   Collection Time    09/28/13  4:59 PM      Result Value Ref Range Status   MRSA by PCR POSITIVE (*) NEGATIVE Final   Comment:  The GeneXpert MRSA Assay (FDA     approved for NASAL specimens     only), is one component of a     comprehensive MRSA colonization     surveillance program. It is not     intended to diagnose MRSA     infection nor to guide or     monitor treatment for     MRSA infections.     RESULT CALLED TO, READ BACK BY AND VERIFIED WITH:     S.DILLON RN AT 1859 ON [id number] BY C.BONGEL  CULTURE, BLOOD (ROUTINE X 2)     Status: None   Collection Time    09/28/13  5:07 PM      Result Value Ref Range Status   Specimen Description BLOOD RIGHT ARM   Final   Special Requests BOTTLES DRAWN AEROBIC AND ANAEROBIC 9CC   Final   Culture  Setup Time     Final   Value: 09/28/2013 21:24     Performed at [facility]   Culture     Final   Value:        BLOOD CULTURE RECEIVED NO GROWTH TO DATE CULTURE WILL BE HELD FOR 5 DAYS BEFORE ISSUING A FINAL NEGATIVE REPORT     Performed at [facility]   Report Status PENDING   Incomplete    Anti-infectives:  4/19  CTX x 1  4/19 >> Fluconazole >>  4/19 >> Vanc >>  4/19 >> Zosyn >>  4/20 >> cephalexin >> 4/20  4/20 >> vanc PO >  4/20 >> flagyl IV >>   Assessment: 89 yoM with history of recurrent C.diff (completed course of tapered oral vancomycin on 4/15) presents with sepsis secondary to UTI and/or left leg cellulitis. Pharmacy initially consulted to dose Vancomycin and Zosyn which was de-escalated to cephalexin on 4/20 due to mild cellulitis case.  Blood cultures now growing GPC pairs and antibiotics broadened back to Vancomycin and Zosyn per pharmacy dosing.  He remains on fluconazole for funguria and oral vanc/IV metronidazole for recurrent Cdiff.  Tmax: 99.2  WBC: 8  Renal: SCr increased to 1.2, CrCl ~ 36 ml/min  Vancomycin trough level pending, 4/21 at 1700   Goal of Therapy:  Vancomycin trough level 15-20 mcg/ml Appropriate abx dosing, eradication of infection.   Plan:   Zosyn 3.375g IV Q8H infused over 4hrs.   Vancomycin 750mg  IV q12h.  Measure Vanc trough at steady state.  Follow up renal fxn and culture results.  Lynann Beaver PharmD, BCPS Pager [phone] 09/30/2013 1:54 PM

## 2013-09-30 NOTE — Progress Notes (Signed)
Nutrition Brief Note  Received consult for assessment. Full nutrition assessment completed yesterday. Met with pt this morning, observed pt ate 100% of breakfast tray (some cereal, milk, and fruit). States he ate all of his breakfast yesterday morning too, but said he got confused yesterday and is unsure about lunch and dinner intake. Pt without any nutritional concerns at this time. Will continue Glucerna shakes TID.   Mikey College MS, [place], [place] Pager [phone] After Hours Pager

## 2013-09-30 NOTE — Progress Notes (Signed)
ANTICOAGULATION CONSULT NOTE - Follow Up Consult  Pharmacy Consult for Heparin  Indication: NSTEMI  Allergies  Allergen Reactions  . Colestipol Other (See Comments)    "Blacked Out"   . Morphine And Related Other (See Comments)    Reaction: hallucinations    Patient Measurements: Height: 6' (182.9 cm) Weight: 202 lb 2.6 oz (91.7 kg) IBW/kg (Calculated) : 77.6 Heparin Dosing Weight: 91.7 kg  Vital Signs: Temp: 99.2 F (37.3 C) (04/21 0400) Temp src: Oral (04/21 0400) BP: 114/76 mmHg (04/21 0600) Pulse Rate: 104 (04/21 0600)  Labs:  Recent Labs  09/28/13 1142  09/28/13 1750 09/28/13 2246 09/29/13 0345 09/29/13 1542 09/29/13 2102 09/30/13 0140 09/30/13 0400 09/30/13 1100  HGB 11.1*  --   --   --  11.0*  --   --   --   --  10.3*  HCT 33.8*  --   --   --  33.5*  --   --   --   --  31.0*  PLT 103*  --   --   --  82*  --   --   --   --  72*  LABPROT  --   --   --   --  17.0*  --   --   --   --   --   INR  --   --   --   --  1.42  --   --   --   --   --   HEPARINUNFRC  --   --   --   --   --   --   --  0.22*  --  0.15*  CREATININE 0.92  --   --   --  1.02  --   --   --  1.21  --   CKTOTAL 53  --  60 58  --   --   --   --   --   --   CKMB  --   --  5.9* 4.9*  --   --   --   --   --   --   TROPONINI  --   < > 0.87* 1.42*  --  0.72* 1.01*  --  1.26*  --   < > = values in this interval not displayed.  Estimated Creatinine Clearance: 35.6 ml/min (by C-G formula based on Cr of 1.21).   Medications:  Infusions:  . sodium chloride 100 mL/hr at 09/29/13 1536  . heparin 1,150 Units/hr (09/30/13 0240)  . norepinephrine (LEVOPHED) Adult infusion Stopped (09/30/13 0820)    Assessment: 89 yoM admitted 4/19 with sepsis (UTI, cellulitis, bacteremia). PMH significant for CAD s/p MI and CABG, systolic CHF with EF 35%, severe aortic stenosis, paroxysmal atrial flutter, HTN, HLD, DM, CKD. Now concerning for NSTEMI with elevated troponins (0.87, 1.42) vs demand ischemia. History of  thrombocytopenia is known (baseline ~ 80-110k). Pharmacy is consulted to start heparin infusion.  Troponins: 0.87, 1.42, 0.72, 1.01, 1.26  Baseline coags: INR 1.42   SCr increased to 1.21, CrCl ~ 36 ml/min.  CBC: Hgb decreased to 10.3 (baseline 10-12), Plt decreased to 72.   No bleeding or complications are reported.   Heparin level: 0.15, below goal and decreased despite recent infusion rate increase.   Goal of Therapy:  Heparin level 0.3-0.7 units/ml Monitor platelets by anticoagulation protocol: Yes   Plan:   Increase to heparin IV infusion at 1250 units/hr (12.5 ml/hr)  Heparin level in 8 hours  Daily heparin level and CBC  Continue to monitor H&H and platelets   Lynann Beaverhristine Laelle Bridgett PharmD, BCPS Pager [phone] 09/30/2013 8:50 AM

## 2013-09-30 NOTE — Progress Notes (Addendum)
[facility] PROGRESS NOTE  Christiane HaCharles W Mccutchen ZOX:[id number] DOB: 07/28/1910 DOA: 09/28/2013 PCP: Thayer HeadingsMACKENZIE,BRIAN, MD  Interim summary:  Patient is a 80 year old white male with past medical history of CAD, diabetes, CK-MB and recent C. difficile-completed oral vancomycin course on 4/16 who had an extended stay in December of 2014 for congestive heart failure and a non-ST elevated MI, medically managed. He presented to the emergency room on 4/21 with generalized weakness, fever and frequent falls. He was found to be hypotensive with a mild leukocytosis and cellulitis of the left lower extremity, yeast in urine and diarrhea which became positive for C. difficile again.  His troponins were also mildly elevated. Patient was admitted for septic shock  Patient was placed in the step down unit and started on by mouth vancomycin/IV Flagyl for C. difficile, Diflucan for candiduria.  Patient's blood cultures preliminary growing out gram-positive cocci in clusters-presumably strep, most likely source is his cellulitis and he is on vancomycin/Zosyn for this. Despite aggressive fluid boluses on 4/20, patient remained hypotensive and critical care was consulted for central line placement and started on pressor support. Patient tolerated this well and pressors have been weaned off on 4/20 one more week.  Lastly, the patient's troponins trended upward. This was discussed with cardiology. Patient was felt to be having a non-STEMI secondary to demand ischemia brought on by septic shock and secondary hypotension. Cardiology recommendation was for treating underlying issue. Heparin has been started as per pharmacy. Patient overall clinically improved on 4/21 and more stable.  Assessment/Plan: Active Problems:   Paroxysmal a-fib: Currently in normal sinus rhythm. Borderline tachycardia secondary to lower blood pressures    DM (diabetes mellitus), type 2, uncontrolled with complications: CBG starting to rise. Patient  ate most of his morning breakfast. Had started low-dose Lantus this morning.    Hypertension: Currently hypotensive. See below    S/P CABG (coronary artery bypass graft)    Hyperlipidemia: Stable    Hypothyroidism: Continue Synthroid    Recurrent colitis due to Clostridium difficile: C. difficile cultures positive. Given his severe illness on by mouth vancomycin plus IV Flagyl.    Hypotension: Secondary septic shock. Treating with aggressive fluid boluses and then pressor support added for/20. Patient was stabilizing and pressors discontinued 4/21 morning.,    Septic shock: Principal problem. Patient needs criteria with positive Blood cultures (growing out what preliminary looks to be strep) plus hypotension, likely source is right lower extremity. Continue aggressive fluid rehydration + pressor support . Patient is DO NOT RESUSCITATE.  critical care consulted and the central line placed 4/20   Non-ST elevated MI: Likely some demand ischemia given sepsis. Follow enzymes. Treat underlying infection. I discussed with cardiology and recommendation is as given.  Cycling troponins. Have discussed with pharmacy and will start heparin drip.Troponin came down and back up this morning slightly. Repeat level tomorrow.    UTI (lower urinary tract infection) yeast growing out of urine. Continue Diflucan:     Cellulitis: Mild appearance, although with positive blood cultures, likely the source of his infection. On IV vancomycin and Zosyn.  Chronic systolic CHF (congestive heart failure):  monitoring strict input and output. Right now aggressively hydrated and perfusion and blood pressure.  and he is better stabilized and pressures are normal, we'll need to check BNP and likely do some gentle diuresis.  Thrombocytopenia: The patient has a history of low platelets. Initially, pharmacy wanted to stop prophylactic heparin.  However given concerns of non-STEMI, full dose heparin currently on board. We'll  continue to monitor platelet counts   Code Status: DO NOT RESUSCITATE  Family Communication:  some updated at the bedside   Disposition Plan: Continue in step down  Consultants:  Pharmacy  Critical care  Case discussed with cardiology by phone   Procedures:  None  Antibiotics:  IV vancomycin 4/19-present  IV Zosyn 4/19-present  By mouth vancomycin 4/20-present  By mouth Keflex times one dose 4/20  IV Flagyl 4/20-present  Oral Diflucan 4/19-present  HPI/Subjective:  patient feeling somewhat better today. Still some diarrhea. Better appetite.  Objective: Filed Vitals:   09/30/13 0930  BP: 88/61  Pulse: 108  Temp:   Resp: 25    Intake/Output Summary (Last 24 hours) at 09/30/13 1002 Last data filed at 09/30/13 0900  Gross per 24 hour  Intake 5421.67 ml  Output    405 ml  Net 5016.67 ml   Filed Weights   09/28/13 1700 09/29/13 0500 09/30/13 0422  Weight: 85 kg (187 lb 6.3 oz) 88 kg (194 lb 0.1 oz) 91.7 kg (202 lb 2.6 oz)    Exam:   General:  Alert and oriented x3, no acute distress, fatigued  Cardiovascular: Regular rate and rhythm, S1-S2, borderline tachycardia   Respiratory: Clear to auscultation bilaterally  Abdomen: Soft, nontender, nondistended, positive bowel sounds  Musculoskeletal: No clubbing or cyanosis, left lower extremity has chronic 1+ pitting edema from the knee down , Mild erythema   Data Reviewed: Basic Metabolic Panel:  Recent Labs Lab 09/28/13 1142 09/29/13 0345 09/30/13 0400  NA 142 145 141  K 4.0 3.6* 3.6*  CL 104 112 108  CO2 22 19 19   GLUCOSE 169* 173* 293*  BUN 18 20 28*  CREATININE 0.92 1.02 1.21  CALCIUM 9.0 7.7* 7.6*  MG  --  1.8  --   PHOS  --  3.4  --    Liver Function Tests:  Recent Labs Lab 09/28/13 1142 09/29/13 0345  AST 20 20  ALT 10 10  ALKPHOS 53 46  BILITOT 1.0 0.8  PROT 6.7 6.1  ALBUMIN 3.2* 2.6*   No results found for this basename: LIPASE, AMYLASE,  in the last 168 hours No  results found for this basename: AMMONIA,  in the last 168 hours CBC:  Recent Labs Lab 09/28/13 1142 09/29/13 0345  WBC 11.3* 9.1  NEUTROABS 8.1* 6.7  HGB 11.1* 11.0*  HCT 33.8* 33.5*  MCV 97.7 98.5  PLT 103* 82*   Cardiac Enzymes:  Recent Labs Lab 09/28/13 1142 09/28/13 1750 09/28/13 2246 09/29/13 1542 09/29/13 2102 09/30/13 0400  CKTOTAL 53 60 58  --   --   --   CKMB  --  5.9* 4.9*  --   --   --   TROPONINI  --  0.87* 1.42* 0.72* 1.01* 1.26*   BNP (last 3 results)  Recent Labs  05/16/13 1403 05/20/13 0606 06/14/13 1150  PROBNP 2658.0* 1575.0* 879.1*   CBG:  Recent Labs Lab 09/29/13 0736 09/29/13 1219 09/29/13 1652 09/29/13 2101 09/30/13 0732  GLUCAP 194* 165* 178* 320* 307*    Recent Results (from the past 240 hour(s))  CULTURE, BLOOD (ROUTINE X 2)     Status: None   Collection Time    09/28/13 11:42 AM      Result Value Ref Range Status   Specimen Description BLOOD LEFT ARM  5 ML IN [facility] BOTTLE   Final   Special Requests NONE   Final   Culture  Setup Time     Final  Value: 09/28/2013 21:24     Performed at [facility]   Culture     Final   Value: GRAM POSITIVE COCCI IN PAIRS     Note: Gram Stain Report Called to,Read Back By and Verified With: GAIL S@11 :51AM ON 09/29/13 BY DANTS     Performed at [facility]   Report Status PENDING   Incomplete  URINE CULTURE     Status: None   Collection Time    09/28/13 11:59 AM      Result Value Ref Range Status   Specimen Description URINE, RANDOM   Final   Special Requests Immunocompromised   Final   Culture  Setup Time     Final   Value: 09/28/2013 19:47     Performed at [facility] Count     Final   Value: 15,000 COLONIES/ML     Performed at [facility]   Culture     Final   Value: Multiple bacterial morphotypes present, none predominant. Suggest appropriate recollection if clinically indicated.     Performed at [facility]   Report Status  09/29/2013 FINAL   Final  CLOSTRIDIUM DIFFICILE BY PCR     Status: Abnormal   Collection Time    09/28/13  4:59 PM      Result Value Ref Range Status   C difficile by pcr POSITIVE (*) NEGATIVE Final   Comment: CRITICAL RESULT CALLED TO, READ BACK BY AND VERIFIED WITH:     MAIN RN 9:40 09/29/13 ([email or web address])     Performed at [facility]  MRSA PCR SCREENING     Status: Abnormal   Collection Time    09/28/13  4:59 PM      Result Value Ref Range Status   MRSA by PCR POSITIVE (*) NEGATIVE Final   Comment:            The GeneXpert MRSA Assay (FDA     approved for NASAL specimens     only), is one component of a     comprehensive MRSA colonization     surveillance program. It is not     intended to diagnose MRSA     infection nor to guide or     monitor treatment for     MRSA infections.     RESULT CALLED TO, READ BACK BY AND VERIFIED WITH:     S.DILLON RN AT 1859 ON [id number] BY C.BONGEL  CULTURE, BLOOD (ROUTINE X 2)     Status: None   Collection Time    09/28/13  5:07 PM      Result Value Ref Range Status   Specimen Description BLOOD RIGHT ARM   Final   Special Requests BOTTLES DRAWN AEROBIC AND ANAEROBIC 9CC   Final   Culture  Setup Time     Final   Value: 09/28/2013 21:24     Performed at [facility]   Culture     Final   Value:        BLOOD CULTURE RECEIVED NO GROWTH TO DATE CULTURE WILL BE HELD FOR 5 DAYS BEFORE ISSUING A FINAL NEGATIVE REPORT     Performed at [facility]   Report Status PENDING   Incomplete     Studies: Dg Chest Port 1 View  09/29/2013   CLINICAL DATA:  Central line placement, history diabetes, hypertension, coronary artery disease post MI, CHF  EXAM: PORTABLE CHEST - 1 VIEW  COMPARISON:  Portable exam  1630 hr compared to 09/28/2013  FINDINGS: Right jugular central venous catheter tip projects over SVC near cavoatrial junction.  Enlargement of cardiac silhouette post CABG.  Pulmonary vascular congestion.  Bibasilar atelectasis.  No acute  infiltrate, pleural effusion or pneumothorax.  Bones appear demineralized.  IMPRESSION: No pneumothorax following right jugular line placement.  Enlargement of cardiac silhouette with pulmonary vascular congestion post CABG.  Mild bibasilar atelectasis.   Electronically Signed   By: Ulyses Southward M.D.   On: 09/29/2013 16:50   Dg Chest Port 1 View  09/28/2013   CLINICAL DATA:  Hypotension. Weakness. Congestive heart failure. Pneumonia.  EXAM: PORTABLE CHEST - 1 VIEW  COMPARISON:  09/05/2013  FINDINGS: Cardiomegaly stable. Prior CABG again noted. Pulmonary interstitial prominence remains stable. No evidence of acute infiltrate or edema. No evidence of pleural effusion.  IMPRESSION: Stable cardiomegaly.  No active lung disease.   Electronically Signed   By: Myles Rosenthal M.D.   On: 09/28/2013 14:40    Scheduled Meds: . aspirin EC  81 mg Oral Daily  . atorvastatin  10 mg Oral QHS  . Chlorhexidine Gluconate Cloth  6 each Topical Q0600  . feeding supplement (GLUCERNA SHAKE)  237 mL Oral TID WC  . finasteride  5 mg Oral QHS  . fluconazole  200 mg Oral Daily  . insulin aspart  0-15 Units Subcutaneous TID WC  . insulin glargine  6 Units Subcutaneous Daily  . levothyroxine  50 mcg Oral QAC breakfast  . vancomycin  500 mg Oral 4 times per day   And  . metronidazole  500 mg Intravenous 3 times per day  . mirtazapine  15 mg Oral QHS  . multivitamin with minerals  1 tablet Oral q morning - 10a  . mupirocin ointment  1 application Nasal BID  . piperacillin-tazobactam  3.375 g Intravenous 3 times per day  . saccharomyces boulardii  250 mg Oral BID  . sodium chloride  3 mL Intravenous Q12H  . tamsulosin  0.4 mg Oral QPC supper  . vancomycin  750 mg Intravenous Q12H   Continuous Infusions: . sodium chloride 75 mL/hr at 09/30/13 0932  . heparin 1,150 Units/hr (09/30/13 0240)  . norepinephrine (LEVOPHED) Adult infusion Stopped (09/30/13 0820)    Active Problems:   CAD (coronary artery disease)    Paroxysmal a-fib   DM (diabetes mellitus), type 2, uncontrolled with complications   Hypertension   S/P CABG (coronary artery bypass graft)   Hyperlipidemia   Hypothyroidism   Recurrent colitis due to Clostridium difficile   Hypotension   Sepsis   Troponin level elevated   UTI (lower urinary tract infection)   Cellulitis   Chronic systolic CHF (congestive heart failure)    Time spent: 35 minutes    Hollice Espy  [facility] Pager [phone] If 7PM-7AM, please contact night-coverage at [email or web address], password [facility] 09/30/2013, 10:02 AM  LOS: 2 days

## 2013-09-30 NOTE — Progress Notes (Addendum)
PHARMACY - VANCOMYCIN (brief note)  Patient on Vancomycin 750mg  IV q12h for left leg cellulitis as well as sepsis due to UTI   Vancomycin Trough = 15.3 mcg/ml on regimen of Vancomycin 750mg  IV q12h.  Blood culture shows Gram + cocci in pairs  Patient also on fluconazole, PO vanc/IV flagyl   Vancomycin trough within desired range of 15-20 mcg/ml  PLAN: Continue Vancomycin 750mg  IV q12h  Terrilee Files, PharmD 4/21 @ 19:11

## 2013-10-01 DIAGNOSIS — L0291 Cutaneous abscess, unspecified: Secondary | ICD-10-CM

## 2013-10-01 DIAGNOSIS — I5022 Chronic systolic (congestive) heart failure: Secondary | ICD-10-CM

## 2013-10-01 DIAGNOSIS — I509 Heart failure, unspecified: Secondary | ICD-10-CM

## 2013-10-01 DIAGNOSIS — L0390 Cellulitis, unspecified: Secondary | ICD-10-CM

## 2013-10-01 DIAGNOSIS — R197 Diarrhea, unspecified: Secondary | ICD-10-CM

## 2013-10-01 LAB — COMPREHENSIVE METABOLIC PANEL
ALT: 15 U/L (ref 0–53)
AST: 19 U/L (ref 0–37)
Albumin: 2.4 g/dL — ABNORMAL LOW (ref 3.5–5.2)
Alkaline Phosphatase: 52 U/L (ref 39–117)
BUN: 32 mg/dL — ABNORMAL HIGH (ref 6–23)
CHLORIDE: 109 meq/L (ref 96–112)
CO2: 20 meq/L (ref 19–32)
CREATININE: 1.18 mg/dL (ref 0.50–1.35)
Calcium: 7.4 mg/dL — ABNORMAL LOW (ref 8.4–10.5)
GFR calc Af Amer: 56 mL/min — ABNORMAL LOW (ref >90)
GFR, EST NON AFRICAN AMERICAN: 49 mL/min — AB (ref >90)
Glucose, Bld: 153 mg/dL — ABNORMAL HIGH (ref 70–99)
Potassium: 3 mEq/L — ABNORMAL LOW (ref 3.7–5.3)
SODIUM: 141 meq/L (ref 137–147)
Total Protein: 5.6 g/dL — ABNORMAL LOW (ref 6.0–8.3)

## 2013-10-01 LAB — GLUCOSE, CAPILLARY
GLUCOSE-CAPILLARY: 141 mg/dL — AB (ref 70–99)
GLUCOSE-CAPILLARY: 170 mg/dL — AB (ref 70–99)
Glucose-Capillary: 294 mg/dL — ABNORMAL HIGH (ref 70–99)
Glucose-Capillary: 145 mg/dL — ABNORMAL HIGH (ref 70–99)
Glucose-Capillary: 144 mg/dL — ABNORMAL HIGH (ref 70–99)

## 2013-10-01 LAB — CBC
HCT: 32.1 % — ABNORMAL LOW (ref 39.0–52.0)
Hemoglobin: 10.6 g/dL — ABNORMAL LOW (ref 13.0–17.0)
MCH: 31.9 pg (ref 26.0–34.0)
MCHC: 33 g/dL (ref 30.0–36.0)
MCV: 96.7 fL (ref 78.0–100.0)
PLATELETS: 92 10*3/uL — AB (ref 150–400)
RBC: 3.32 MIL/uL — ABNORMAL LOW (ref 4.22–5.81)
RDW: 14.9 % (ref 11.5–15.5)
WBC: 12.4 10*3/uL — ABNORMAL HIGH (ref 4.0–10.5)

## 2013-10-01 LAB — HEPARIN LEVEL (UNFRACTIONATED)
HEPARIN UNFRACTIONATED: 0.28 [IU]/mL — AB (ref 0.30–0.70)
Heparin Unfractionated: 0.48 IU/mL (ref 0.30–0.70)
Heparin Unfractionated: 0.52 IU/mL (ref 0.30–0.70)

## 2013-10-01 LAB — TROPONIN I: Troponin I: 0.92 ng/mL (ref <0.30)

## 2013-10-01 MED ORDER — POTASSIUM CHLORIDE CRYS ER 20 MEQ PO TBCR
40.0000 meq | EXTENDED_RELEASE_TABLET | ORAL | Status: AC
  Administered 2013-10-01 (×2): 40 meq via ORAL
  Filled 2013-10-01 (×2): qty 2

## 2013-10-01 MED ORDER — HEPARIN (PORCINE) IN NACL 100-0.45 UNIT/ML-% IJ SOLN
1500.0000 [IU]/h | INTRAMUSCULAR | Status: DC
  Filled 2013-10-01 (×3): qty 250

## 2013-10-01 NOTE — Progress Notes (Signed)
ANTICOAGULATION CONSULT NOTE - Follow Up Consult  Pharmacy Consult for Heparin Indication: NSTEMI  Allergies  Allergen Reactions  . Colestipol Other (See Comments)    "Blacked Out"   . Morphine And Related Other (See Comments)    Reaction: hallucinations    Patient Measurements: Height: 6' (182.9 cm) Weight: 205 lb 7.5 oz (93.2 kg) IBW/kg (Calculated) : 77.6 Heparin Dosing Weight:   Vital Signs: Temp: 97.5 F (36.4 C) (04/22 0352) Temp src: Oral (04/22 0352) BP: 86/49 mmHg (04/22 0200) Pulse Rate: 85 (04/22 0200)  Labs:  Recent Labs  09/28/13 1142  09/28/13 1750 09/28/13 2246 09/29/13 0345  09/29/13 2102  09/30/13 0400 09/30/13 1100 09/30/13 2033 10/01/13 0320  HGB 11.1*  --   --   --  11.0*  --   --   --   --  10.3*  --  10.6*  HCT 33.8*  --   --   --  33.5*  --   --   --   --  31.0*  --  32.1*  PLT 103*  --   --   --  82*  --   --   --   --  72*  --  92*  LABPROT  --   --   --   --  17.0*  --   --   --   --   --   --   --   INR  --   --   --   --  1.42  --   --   --   --   --   --   --   HEPARINUNFRC  --   --   --   --   --   --   --   < >  --  0.15* 0.21* 0.28*  CREATININE 0.92  --   --   --  1.02  --   --   --  1.21  --   --  1.18  CKTOTAL 53  --  60 58  --   --   --   --   --   --   --   --   CKMB  --   --  5.9* 4.9*  --   --   --   --   --   --   --   --   TROPONINI  --   < > 0.87* 1.42*  --   < > 1.01*  --  1.26*  --   --  0.92*  < > = values in this interval not displayed.  Estimated Creatinine Clearance: 39.5 ml/min (by C-G formula based on Cr of 1.18).   Medications:  Infusions:  . heparin    . norepinephrine (LEVOPHED) Adult infusion 2 mcg/min (09/30/13 2145)    Assessment: Patient with low heparin level.  No issues noted per RN.  Goal of Therapy:  Heparin level 0.3-0.7 units/ml Monitor platelets by anticoagulation protocol: Yes   Plan:  Increase heparin to 1500 units/hr Recheck level at 1200  [employer]. 10/01/2013,4:15 AM

## 2013-10-01 NOTE — Progress Notes (Signed)
PULMONARY / CRITICAL CARE MEDICINE   Name: David Davidson MRN: [id number] DOB: [birth year]    ADMISSION DATE:  09/28/2013 CONSULTATION DATE:  4/20  REFERRING MD :  Rito Ehrlich  PRIMARY SERVICE: [facility]   CHIEF COMPLAINT:  Septic shock  BRIEF PATIENT DESCRIPTION:  80 year old male admitted on 4/19 w/ recurrent PCM, RLE cellulitis and GPC bacteremia. Course complicated by what appeared to be demand ischemia and persistent shock state. PCCM asked to see on 4/20 for hypotension that did not respond to IVFs.   >pt is DNR. Agrees to central access and short term vaso-active gtts.   SIGNIFICANT EVENTS / STUDIES:    LINES / TUBES:   CULTURES: BCX2 4/19: GPC>>> cdiff PCR 4/19: positive  MRSA PCR 4/19: positive   ANTIBIOTICS: Flagyl 4/19>>> Zosyn 4/19>> vanc 4/19>>> Oral vanc 4/19>>>  SUBJECTIVE:  Still on pressors but low dose. His only real c/o is diarrhea    VITAL SIGNS: Temp:  [97 F (36.1 C)-97.7 F (36.5 C)] 97.5 F (36.4 C) (04/22 0352) Pulse Rate:  [33-126] 88 (04/22 0600) Resp:  [16-28] 20 (04/22 0600) BP: (71-110)/(44-74) 89/52 mmHg (04/22 0600) SpO2:  [86 %-100 %] 97 % (04/22 0600) Weight:  [93.2 kg (205 lb 7.5 oz)] 93.2 kg (205 lb 7.5 oz) (04/22 0352) Room air  HEMODYNAMICS:   VENTILATOR SETTINGS:   INTAKE / OUTPUT: Intake/Output     04/21 0701 - 04/22 0700 04/22 0701 - 04/23 0700   P.O. 400    I.V. (mL/kg) 603.1 (6.5)    IV Piggyback 750    Total Intake(mL/kg) 1753.1 (18.8)    Urine (mL/kg/hr) 1630 (0.7)    Total Output 1630     Net +123.1          Urine Occurrence 1 x    Stool Occurrence 5 x      PHYSICAL EXAMINATION: General:  No acute distress  Neuro:  No focal def  HEENT:  [place], no JVD  Cardiovascular:  rrr Lungs:  Clear no accessory muscle use  Abdomen:  Soft, still inc stool + bowel sounds Musculoskeletal:  Intact  Skin:  RLE erythremic   LABS:  CBC  Recent Labs Lab 09/29/13 0345 09/30/13 1100 10/01/13 0320  WBC 9.1 8.0 12.4*   HGB 11.0* 10.3* 10.6*  HCT 33.5* 31.0* 32.1*  PLT 82* 72* 92*   Coag's  Recent Labs Lab 09/29/13 0345  INR 1.42   BMET  Recent Labs Lab 09/29/13 0345 09/30/13 0400 10/01/13 0320  NA 145 141 141  K 3.6* 3.6* 3.0*  CL 112 108 109  CO2 19 19 20   BUN 20 28* 32*  CREATININE 1.02 1.21 1.18  GLUCOSE 173* 293* 153*   Electrolytes  Recent Labs Lab 09/29/13 0345 09/30/13 0400 10/01/13 0320  CALCIUM 7.7* 7.6* 7.4*  MG 1.8  --   --   PHOS 3.4  --   --    Sepsis Markers  Recent Labs Lab 09/29/13 1705  LATICACIDVEN 3.5*   ABG No results found for this basename: PHART, PCO2ART, PO2ART,  in the last 168 hours Liver Enzymes  Recent Labs Lab 09/28/13 1142 09/29/13 0345 10/01/13 0320  AST 20 20 19   ALT 10 10 15   ALKPHOS 53 46 52  BILITOT 1.0 0.8 <0.2*  ALBUMIN 3.2* 2.6* 2.4*   Cardiac Enzymes  Recent Labs Lab 09/29/13 2102 09/30/13 0400 10/01/13 0320  TROPONINI 1.01* 1.26* 0.92*   Glucose  Recent Labs Lab 09/29/13 1652 09/29/13 2101 09/30/13 0732 09/30/13 1155  09/30/13 1729 09/30/13 2243  GLUCAP 178* 320* 307* 294* 230* 196*    Imaging Dg Chest Port 1 View  09/29/2013   CLINICAL DATA:  Central line placement, history diabetes, hypertension, coronary artery disease post MI, CHF  EXAM: PORTABLE CHEST - 1 VIEW  COMPARISON:  Portable exam 1630 hr compared to 09/28/2013  FINDINGS: Right jugular central venous catheter tip projects over SVC near cavoatrial junction.  Enlargement of cardiac silhouette post CABG.  Pulmonary vascular congestion.  Bibasilar atelectasis.  No acute infiltrate, pleural effusion or pneumothorax.  Bones appear demineralized.  IMPRESSION: No pneumothorax following right jugular line placement.  Enlargement of cardiac silhouette with pulmonary vascular congestion post CABG.  Mild bibasilar atelectasis.   Electronically Signed   By: Ulyses Southward M.D.   On: 09/29/2013 16:50   Agree, vasc congestion but no infiltrates     ASSESSMENT  / PLAN:  PULMONARY A: no acute  P:   Cont pulse ox  O2 as needed  F/u cxr in am   CARDIOVASCULAR A: Septic shock NSTEMI vs demand ischemia >not a candidate, nor would he agree to anything more than medical management re: demand ischemia  Systolic heart failure. EF 35-40% H/o af. Currently NSR P:  See ID section Cont heparin gtt per IM Could consider low dose BB when SBP improved.   RENAL A:   No acute injury  Hypokalemia  P:   Replace K  Renal dose meds   GASTROINTESTINAL A:   PMC P:   Adv diet as tol See ID section   HEMATOLOGIC A:   Anemia. Suspect hgb drift over last 24 hrs reflects hemodilution  P:  Monitor h&h w/ heparin    INFECTIOUS A:   Cellulitis w/ GPC bacteremia  C diff colitis  Septic shock due to above  P:   Cont empiric abx Narrow when sensitivities available   ENDOCRINE A:   Dm w/ hyperglycemia  Hypothyroidism  P:   Ssi synthroid  NEUROLOGIC A:  No acute  P:   Supportive care   TODAY'S SUMMARY: No worse. Diarrhea is his only c/o, but that has him fairly frustrated. Still on low dose levophed. BC still have not finalized. Will wean levophed for goal of SBP >90. Other wise no changes from our stand-point.   CC time 35 min.  Alyson Reedy, M.D. [facility] Pulmonary/Critical Care Medicine. Pager: [phone]. After hours pager: [phone].

## 2013-10-01 NOTE — Progress Notes (Signed)
Rx Brief Anticoagulation:  IV Heparin  Assessement:  HL= 0.52 (therapeutic range x2)  No problems reported per RN  Plan:  Continue Heparin drip @ 1500 units/hr  Recheck HL in am  Lorenza Evangelist 10/01/2013 11:05 PM

## 2013-10-01 NOTE — Progress Notes (Signed)
[facility] Progress Note [place] TEAM 1 - Stepdown/ICU TEAM   David Davidson ZOX:[id number] DOB: January 30, 1911 DOA: 09/28/2013 PCP: [name], MD  Brief narrative: David Davidson is a 80 y.o. male presenting on 09/28/2013 past medical history of CAD, diabetes, CK-MB and recent C. difficile-completed oral vancomycin course on 4/16 who had an extended stay in December of 2014 for congestive heart failure and a non-ST elevated MI, medically managed. He presented to the emergency room on 4/21 with generalized weakness, fever and frequent falls. He was found to be hypotensive with a mild leukocytosis and cellulitis of the left lower extremity, yeast in urine and diarrhea which became positive for C. difficile again. His troponins were also mildly elevated. Patient was admitted for septic shock  Patient was placed in the step down unit and started on by mouth vancomycin/IV Flagyl for C. difficile, Diflucan for candiduria. Patient's blood cultures preliminary growing out gram-positive cocci in clusters-presumably strep, most likely source is his cellulitis and he is on vancomycin/Zosyn for this. Despite aggressive fluid boluses on 4/20, patient remained hypotensive and critical care was consulted for central line placement and started on pressor support. Patient tolerated this well and pressors have been weaned off on 4/20 one more week.  Lastly, the patient's troponins trended upward. This was discussed with cardiology. Patient was felt to be having a non-STEMI secondary to demand ischemia brought on by septic shock and secondary hypotension. Cardiology recommendation was for treating underlying issue. Heparin has been started as per pharmacy. Patient overall clinically improved on 4/21 and more stable.   Subjective: Pt alert, has no complaints.   Assessment/Plan: Principal Problem:   Severe sepsis with septic shock -Levophed- only at 2 mcg- advised RN to titrate off (a) Recurrent  colitis due to Clostridium difficile - C. difficile cultures positive. Given his severe illness on by mouth vancomycin plus IV Flagyl.  (b) UTI - funguria -Continue Diflucan:  (c)Cellulitis - lef leg- appears to have resolved  - 1/2 sets of positive blood cultures- possibly a contaminant- follow     Paroxysmal a-fib - Currently in normal sinus rhythm  - does not appear to be on chronic anticoagulation  DM (diabetes mellitus), type 2 Cont Lantus and novolog sliding scale  Hypertension - Currently hypotensive.   S/P CABG   Hyperlipidemia - Stable   Hypothyroidism - Continue Synthroid   Hypotension - Secondary septic shock. Treating with aggressive fluid boluses and then pressor support added for/20. Patient was stabilizing and pressors discontinued 4/21 morning   Non-ST elevated MI- likely secondary to septic shock [name] discussed with cardiology and recommendations were to Cycle troponins, heparin drip - Troponin bumped slightly but now seem to be improving - T max 1.26   Thrombocytopenia - The patient has a history of low platelets. Initially, pharmacy wanted to stop prophylactic heparin. However given concerns of non-STEMI, full dose heparin currently on board. We'll continue to monitor platelet counts    Code Status: DNR Family Communication: none Disposition Plan: to be determined  Consultants: ICU  Procedures: Central line- 4/20  Antibiotics: IV vancomycin 4/19-present  IV Zosyn 4/19-present  By mouth vancomycin 4/20-present  By mouth Keflex times one dose 4/20  IV Flagyl 4/20-present  Oral Diflucan 4/19-present    DVT prophylaxis: On heparin infusion  Objective: Filed Weights   09/29/13 0500 09/30/13 0422 10/01/13 0352  Weight: 88 kg (194 lb 0.1 oz) 91.7 kg (202 lb 2.6 oz) 93.2 kg (205 lb 7.5 oz)   Blood pressure  97/78, pulse 103, temperature 98.1 F (36.7 C), temperature source Oral, resp. rate 20, height 6' (1.829 m), weight 93.2 kg  (205 lb 7.5 oz), SpO2 100.00%.  Intake/Output Summary (Last 24 hours) at 10/01/13 1239 Last data filed at 10/01/13 1200  Gross per 24 hour  Intake 1183.13 ml  Output   1650 ml  Net -466.87 ml     Exam: General: No acute respiratory distress Lungs: Clear to auscultation bilaterally without wheezes or crackles Cardiovascular: Regular rate and rhythm without murmur gallop or rub normal S1 and S2 Abdomen: Nontender, nondistended, soft, bowel sounds positive, no rebound, no ascites, no appreciable mass Extremities: No significant cyanosis, clubbing, + edema R > L  Data Reviewed: Basic Metabolic Panel:  Recent Labs Lab 09/28/13 1142 09/29/13 0345 09/30/13 0400 10/01/13 0320  NA 142 145 141 141  K 4.0 3.6* 3.6* 3.0*  CL 104 112 108 109  CO2 22 19 19 20   GLUCOSE 169* 173* 293* 153*  BUN 18 20 28* 32*  CREATININE 0.92 1.02 1.21 1.18  CALCIUM 9.0 7.7* 7.6* 7.4*  MG  --  1.8  --   --   PHOS  --  3.4  --   --    Liver Function Tests:  Recent Labs Lab 09/28/13 1142 09/29/13 0345 10/01/13 0320  AST 20 20 19   ALT 10 10 15   ALKPHOS 53 46 52  BILITOT 1.0 0.8 <0.2*  PROT 6.7 6.1 5.6*  ALBUMIN 3.2* 2.6* 2.4*   No results found for this basename: LIPASE, AMYLASE,  in the last 168 hours No results found for this basename: AMMONIA,  in the last 168 hours CBC:  Recent Labs Lab 09/28/13 1142 09/29/13 0345 09/30/13 1100 10/01/13 0320  WBC 11.3* 9.1 8.0 12.4*  NEUTROABS 8.1* 6.7  --   --   HGB 11.1* 11.0* 10.3* 10.6*  HCT 33.8* 33.5* 31.0* 32.1*  MCV 97.7 98.5 97.8 96.7  PLT 103* 82* 72* 92*   Cardiac Enzymes:  Recent Labs Lab 09/28/13 1142  09/28/13 1750 09/28/13 2246 09/29/13 1542 09/29/13 2102 09/30/13 0400 10/01/13 0320  CKTOTAL 53  --  60 58  --   --   --   --   CKMB  --   --  5.9* 4.9*  --   --   --   --   TROPONINI  --   < > 0.87* 1.42* 0.72* 1.01* 1.26* 0.92*  < > = values in this interval not displayed. BNP (last 3 results)  Recent Labs   05/16/13 1403 05/20/13 0606 06/14/13 1150  PROBNP 2658.0* 1575.0* 879.1*   CBG:  Recent Labs Lab 09/30/13 1155 09/30/13 1729 09/30/13 2243 10/01/13 0855 10/01/13 1218  GLUCAP 294* 230* 196* 145* 144*    Recent Results (from the past 240 hour(s))  CULTURE, BLOOD (ROUTINE X 2)     Status: None   Collection Time    09/28/13 11:42 AM      Result Value Ref Range Status   Specimen Description BLOOD LEFT ARM  [facility]   Final   Special Requests NONE   Final   Culture  Setup Time     Final   Value: 09/28/2013 21:24     [facility]formed [facility]   Culture     Final   Value: GRAM POSITIVE COCCI IN PAIRS     Note: Gram Stain Report Called to,Read Back By and Verified With: GAIL S@11 :51AM ON 09/29/13 BY DANTS  Performed at [facility]   Report Status PENDING   Incomplete  URINE CULTURE     Status: None   Collection Time    09/28/13 11:59 AM      Result Value Ref Range Status   Specimen Description URINE, RANDOM   Final   Special Requests Immunocompromised   Final   Culture  Setup Time     Final   Value: 09/28/2013 19:47     Performed at [facility] Count     Final   Value: 15,000 COLONIES/ML     Performed at [facility]   Culture     Final   Value: Multiple bacterial morphotypes present, none predominant. Suggest appropriate recollection if clinically indicated.     Performed at [facility]   Report Status 09/29/2013 FINAL   Final  CLOSTRIDIUM DIFFICILE BY PCR     Status: Abnormal   Collection Time    09/28/13  4:59 PM      Result Value Ref Range Status   C difficile by pcr POSITIVE (*) NEGATIVE Final   Comment: CRITICAL RESULT CALLED TO, READ BACK BY AND VERIFIED WITH:     MAIN RN 9:40 09/29/13 ([email or web address])     Performed at [facility]  MRSA PCR SCREENING     Status: Abnormal   Collection Time    09/28/13  4:59 PM      Result Value Ref Range Status   MRSA by PCR POSITIVE (*) NEGATIVE Final    Comment:            The GeneXpert MRSA Assay (FDA     approved for NASAL specimens     only), is one component of a     comprehensive MRSA colonization     surveillance program. It is not     intended to diagnose MRSA     infection nor to guide or     monitor treatment for     MRSA infections.     RESULT CALLED TO, READ BACK BY AND VERIFIED WITH:     S.DILLON RN AT 1859 ON 19APR15 BY C.BONGEL  CULTURE, BLOOD (ROUTINE X 2)     Status: None   Collection Time    09/28/13  5:07 PM      Result Value Ref Range Status   Specimen Description BLOOD RIGHT ARM   Final   Special Requests BOTTLES DRAWN AEROBIC AND ANAEROBIC 9CC   Final   Culture  Setup Time     Final   Value: 09/28/2013 21:24     Performed at [facility]   Culture     Final   Value:        BLOOD CULTURE RECEIVED NO GROWTH TO DATE CULTURE WILL BE HELD FOR 5 DAYS BEFORE ISSUING A FINAL NEGATIVE REPORT     Performed at [facility]   Report Status PENDING   Incomplete     Studies:  Recent x-ray studies have been reviewed in detail by the Attending Physician  Scheduled Meds:  Scheduled Meds: . aspirin EC  81 mg Oral Daily  . atorvastatin  10 mg Oral QHS  . Chlorhexidine Gluconate Cloth  6 each Topical Q0600  . feeding supplement (GLUCERNA SHAKE)  237 mL Oral TID WC  . finasteride  5 mg Oral QHS  . fluconazole  200 mg Oral Daily  . insulin aspart  0-15 Units Subcutaneous TID WC  . insulin glargine  6  Units Subcutaneous Daily  . levothyroxine  50 mcg Oral QAC breakfast  . vancomycin  500 mg Oral 4 times per day   And  . metronidazole  500 mg Intravenous 3 times per day  . mirtazapine  15 mg Oral QHS  . multivitamin with minerals  1 tablet Oral q morning - 10a  . mupirocin ointment  1 application Nasal BID  . piperacillin-tazobactam  3.375 g Intravenous 3 times per day  . saccharomyces boulardii  250 mg Oral BID  . sodium chloride  3 mL Intravenous Q12H  . tamsulosin  0.4 mg Oral QPC supper  .  vancomycin  750 mg Intravenous Q12H   Continuous Infusions: . heparin 1,500 Units/hr (10/01/13 0415)  . norepinephrine (LEVOPHED) Adult infusion Stopped (10/01/13 0906)    Time spent on care of this patient: 35 min   Calvert Cantor, MD 10/01/2013, 12:39 PM  LOS: 3 days   [facility] Office  [phone] Pager - Text Page per Loretha Stapler   If 7PM-7AM, please contact night-coverage [email or web address]

## 2013-10-01 NOTE — Progress Notes (Signed)
[id number] Rylee ,RN, BSN,CCM: Chart reviewed for patient updates and needs. 

## 2013-10-01 NOTE — Progress Notes (Signed)
ANTICOAGULATION CONSULT NOTE - Follow Up Consult  Pharmacy Consult for Heparin  Indication: NSTEMI  Allergies  Allergen Reactions  . Colestipol Other (See Comments)    "Blacked Out"   . Morphine And Related Other (See Comments)    Reaction: hallucinations    Patient Measurements: Height: 6' (182.9 cm) Weight: 205 lb 7.5 oz (93.2 kg) IBW/kg (Calculated) : 77.6 Heparin Dosing Weight: 93.2 kg  Vital Signs: Temp: 97.5 F (36.4 C) (04/22 0352) Temp src: Oral (04/22 0352) BP: 89/52 mmHg (04/22 0600) Pulse Rate: 88 (04/22 0600)  Labs:  Recent Labs  09/28/13 1750 09/28/13 2246  09/29/13 0345  09/29/13 2102  09/30/13 0400 09/30/13 1100 09/30/13 2033 10/01/13 0320 10/01/13 1249  HGB  --   --   < > 11.0*  --   --   --   --  10.3*  --  10.6*  --   HCT  --   --   --  33.5*  --   --   --   --  31.0*  --  32.1*  --   PLT  --   --   --  82*  --   --   --   --  72*  --  92*  --   LABPROT  --   --   --  17.0*  --   --   --   --   --   --   --   --   INR  --   --   --  1.42  --   --   --   --   --   --   --   --   HEPARINUNFRC  --   --   --   --   --   --   < >  --  0.15* 0.21* 0.28* 0.48  CREATININE  --   --   --  1.02  --   --   --  1.21  --   --  1.18  --   CKTOTAL 60 58  --   --   --   --   --   --   --   --   --   --   CKMB 5.9* 4.9*  --   --   --   --   --   --   --   --   --   --   TROPONINI 0.87* 1.42*  --   --   < > 1.01*  --  1.26*  --   --  0.92*  --   < > = values in this interval not displayed.  Estimated Creatinine Clearance: 39.5 ml/min (by C-G formula based on Cr of 1.18).   Medications:  Infusions:  . heparin 1,500 Units/hr (10/01/13 0415)  . norepinephrine (LEVOPHED) Adult infusion 2 mcg/min (10/01/13 0500)    Assessment: 89 yoM admitted 4/19 with sepsis (UTI, cellulitis, bacteremia). PMH significant for CAD s/p MI and CABG, systolic CHF with EF 35%, severe aortic stenosis, paroxysmal atrial flutter, HTN, HLD, DM, CKD. Now concerning for NSTEMI with  elevated troponins (0.87, 1.42) vs demand ischemia. History of thrombocytopenia is known (baseline ~ 80-110k). Pharmacy is consulted to start heparin infusion.  Troponins: 0.87, 1.42, 0.72, 1.01, 1.26, 0.92  SCr 1.18, CrCl ~ 39 ml/min.  CBC: Hgb 10.6, stable.  Known thrombocytopenia, Plt improved to 92.     No bleeding or complications are reported.   Heparin level: 0.48,  within goal range.   Goal of Therapy:  Heparin level 0.3-0.7 units/ml Monitor platelets by anticoagulation protocol: Yes   Plan:   Continue heparin IV infusion at 1500 units/hr (15 ml/hr)  Heparin level in 8 hours  Daily heparin level and CBC  Continue to monitor H&H and platelets   Lynann Beaver PharmD, BCPS Pager [phone] 10/01/2013 1:24 PM

## 2013-10-01 NOTE — Progress Notes (Signed)
eLink Physician-Brief Progress Note Patient Name: David Davidson DOB: Oct 19, 1910 MRN: [id number]  Date of Service  10/01/2013   HPI/Events of Note  Hypokalemia   eICU Interventions  Potassium replaced   Intervention Category Minor Interventions: Electrolytes abnormality - evaluation and management  Dorise Hiss Deterding 10/01/2013, 6:48 AM

## 2013-10-02 ENCOUNTER — Inpatient Hospital Stay (HOSPITAL_COMMUNITY): Payer: Medicare Other

## 2013-10-02 DIAGNOSIS — I359 Nonrheumatic aortic valve disorder, unspecified: Secondary | ICD-10-CM

## 2013-10-02 DIAGNOSIS — R7881 Bacteremia: Secondary | ICD-10-CM | POA: Diagnosis present

## 2013-10-02 DIAGNOSIS — I350 Nonrheumatic aortic (valve) stenosis: Secondary | ICD-10-CM | POA: Diagnosis present

## 2013-10-02 LAB — URINALYSIS, ROUTINE W REFLEX MICROSCOPIC
BILIRUBIN URINE: NEGATIVE
Glucose, UA: NEGATIVE mg/dL
KETONES UR: NEGATIVE mg/dL
Nitrite: NEGATIVE
PH: 5 (ref 5.0–8.0)
Protein, ur: NEGATIVE mg/dL
Specific Gravity, Urine: 1.009 (ref 1.005–1.030)
Urobilinogen, UA: 0.2 mg/dL (ref 0.0–1.0)

## 2013-10-02 LAB — BASIC METABOLIC PANEL
BUN: 32 mg/dL — ABNORMAL HIGH (ref 6–23)
CO2: 20 mEq/L (ref 19–32)
CREATININE: 1.16 mg/dL (ref 0.50–1.35)
Calcium: 7.9 mg/dL — ABNORMAL LOW (ref 8.4–10.5)
Chloride: 111 mEq/L (ref 96–112)
GFR calc non Af Amer: 50 mL/min — ABNORMAL LOW (ref >90)
GFR, EST AFRICAN AMERICAN: 58 mL/min — AB (ref >90)
Glucose, Bld: 181 mg/dL — ABNORMAL HIGH (ref 70–99)
Potassium: 3.9 mEq/L (ref 3.7–5.3)
Sodium: 142 mEq/L (ref 137–147)

## 2013-10-02 LAB — GLUCOSE, CAPILLARY
GLUCOSE-CAPILLARY: 162 mg/dL — AB (ref 70–99)
GLUCOSE-CAPILLARY: 213 mg/dL — AB (ref 70–99)
Glucose-Capillary: 158 mg/dL — ABNORMAL HIGH (ref 70–99)
Glucose-Capillary: 221 mg/dL — ABNORMAL HIGH (ref 70–99)
Glucose-Capillary: 126 mg/dL — ABNORMAL HIGH (ref 70–99)

## 2013-10-02 LAB — CBC
HCT: 33.5 % — ABNORMAL LOW (ref 39.0–52.0)
Hemoglobin: 10.7 g/dL — ABNORMAL LOW (ref 13.0–17.0)
MCH: 31.6 pg (ref 26.0–34.0)
MCHC: 31.9 g/dL (ref 30.0–36.0)
MCV: 98.8 fL (ref 78.0–100.0)
PLATELETS: 95 10*3/uL — AB (ref 150–400)
RBC: 3.39 MIL/uL — ABNORMAL LOW (ref 4.22–5.81)
RDW: 15.3 % (ref 11.5–15.5)
WBC: 8.4 10*3/uL (ref 4.0–10.5)

## 2013-10-02 LAB — CULTURE, BLOOD (ROUTINE X 2)

## 2013-10-02 LAB — URINE MICROSCOPIC-ADD ON

## 2013-10-02 LAB — HEPARIN LEVEL (UNFRACTIONATED): Heparin Unfractionated: 0.69 IU/mL (ref 0.30–0.70)

## 2013-10-02 MED ORDER — HEPARIN SODIUM (PORCINE) 5000 UNIT/ML IJ SOLN
5000.0000 [IU] | Freq: Three times a day (TID) | INTRAMUSCULAR | Status: DC
  Administered 2013-10-02 – 2013-10-07 (×15): 5000 [IU] via SUBCUTANEOUS
  Filled 2013-10-02 (×18): qty 1

## 2013-10-02 MED ORDER — SODIUM CHLORIDE 0.9 % IV SOLN
2.0000 g | Freq: Four times a day (QID) | INTRAVENOUS | Status: AC
  Administered 2013-10-02 – 2013-10-04 (×10): 2 g via INTRAVENOUS
  Filled 2013-10-02 (×13): qty 2000

## 2013-10-02 MED ORDER — SODIUM CHLORIDE 0.9 % IV SOLN
1.0000 g | Freq: Four times a day (QID) | INTRAVENOUS | Status: DC
  Filled 2013-10-02 (×3): qty 1000

## 2013-10-02 MED ORDER — FUROSEMIDE 10 MG/ML IJ SOLN
40.0000 mg | Freq: Once | INTRAMUSCULAR | Status: AC
  Administered 2013-10-02: 40 mg via INTRAVENOUS
  Filled 2013-10-02: qty 4

## 2013-10-02 MED ORDER — POTASSIUM CHLORIDE 20 MEQ/15ML (10%) PO LIQD
40.0000 meq | Freq: Once | ORAL | Status: AC
  Administered 2013-10-02: 40 meq via ORAL
  Filled 2013-10-02: qty 30

## 2013-10-02 NOTE — Progress Notes (Signed)
CARE MANAGEMENT NOTE 10/02/2013  Patient:  David Davidson, David Davidson   Account Number:  [id number]  Date Initiated:  09/29/2013  Documentation initiated by:  DAVIS,RHONDA  Subjective/Objective Assessment:   80 year old with c.diff and hypotension, was given iv bolus in ed, currently being treated for c.diff sepsis     Action/Plan:   tbd may need snf placement/lives alone   Anticipated DC Date:  10/05/2013   Anticipated DC Plan:  SKILLED NURSING FACILITY  In-house referral  Clinical Social Worker  NA      DC Planning Services  NA      [facility] Choice  NA   Choice offered to / List presented to:  NA   DME arranged  NA      DME agency  NA     HH arranged  NA      HH agency  NA   Status of service:  In process, will continue to follow Medicare Important Message given?  NA - LOS <3 / Initial given by admissions (If response is "NO", the following Medicare IM given date [place] will be blank) Date Medicare IM given:   Date Additional Medicare IM given:    Discharge Disposition:    Per UR Regulation:  Reviewed for med. necessity/level of care/duration of stay  If discussed at Long Length of Stay Meetings, dates discussed:    Comments:  [id number]/Rhonda Earlene Plater RN, BSN, CCM, [phone] Chart reviewed for update of needs and condition./ pt remains on iv levophed drip for hypotensive state, iv heparin, had hypoxic event during night.  [id number] Earlene Plater, RN, BSN, [place] [phone] Chart Reviewed for discharge and hospital needs. Discharge needs at time of review: None present will follow for needs. Review of patient progress due on [id number].

## 2013-10-02 NOTE — Progress Notes (Signed)
ANTIBIOTIC CONSULT NOTE - FOLLOW UP  Pharmacy Consult for Ampicillin Indication: Bacteremia  Allergies  Allergen Reactions  . Colestipol Other (See Comments)    "Blacked Out"   . Morphine And Related Other (See Comments)    Reaction: hallucinations    Patient Measurements: Height: 6' (182.9 cm) Weight: 205 lb 7.5 oz (93.2 kg) IBW/kg (Calculated) : 77.6  Vital Signs: BP: 96/62 mmHg (04/23 1000) Pulse Rate: 104 (04/23 1000) Intake/Output from previous day: 04/22 0701 - 04/23 0700 In: 1368.5 [I.V.:618.5; IV Piggyback:750] Out: 925 [Urine:925]  Labs:  Recent Labs  09/30/13 0400 09/30/13 1100 10/01/13 0320 10/02/13 0325  WBC  --  8.0 12.4* 8.4  HGB  --  10.3* 10.6* 10.7*  PLT  --  72* 92* 95*  CREATININE 1.21  --  1.18 1.16   Estimated Creatinine Clearance: 40.1 ml/min (by C-G formula based on Cr of 1.16).  Recent Labs  09/30/13 1815  VANCOTROUGH 15.3     Microbiology: Recent Results (from the past 720 hour(s))  CULTURE, BLOOD (ROUTINE X 2)     Status: None   Collection Time    09/28/13 11:42 AM      Result Value Ref Range Status   Specimen Description BLOOD LEFT ARM  [facility]   Final   Special Requests NONE   Final   Culture  Setup Time     Final   Value: 09/28/2013 21:24     Performed at [facility]   Culture     Final   Value: ENTEROCOCCUS GALLINARUM     STAPHYLOCOCCUS SPECIES (COAGULASE NEGATIVE)     Note: THE SIGNIFICANCE OF ISOLATING THIS ORGANISM FROM A SINGLE SET OF BLOOD CULTURES WHEN MULTIPLE SETS ARE DRAWN IS UNCERTAIN. PLEASE NOTIFY THE MICROBIOLOGY DEPARTMENT WITHIN ONE WEEK IF SPECIATION AND SENSITIVITIES ARE REQUIRED.     Note: Gram Stain Report Called to,Read Back By and Verified With: GAIL S@11 :51AM ON 09/29/13 BY DANTS     Performed at [facility]   Report Status 10/02/2013 FINAL   Final   Organism ID, Bacteria ENTEROCOCCUS GALLINARUM   Final  URINE CULTURE     Status: None   Collection Time    09/28/13 11:59  AM      Result Value Ref Range Status   Specimen Description URINE, RANDOM   Final   Special Requests Immunocompromised   Final   Culture  Setup Time     Final   Value: 09/28/2013 19:47     [facility]formed [facility]   Colony Count     Final   Value: 15,000 COLONIES/ML     Performed at [facility]   Culture     Final   Value: Multiple bacterial morphotypes present, none predominant. Suggest appropriate recollection if clinically indicated.     Performed at [facility]   Report Status 09/29/2013 FINAL   Final  CLOSTRIDIUM DIFFICILE BY PCR     Status: Abnormal   Collection Time    09/28/13  4:59 PM      Result Value Ref Range Status   C difficile by pcr POSITIVE (*) NEGATIVE Final   Comment: CRITICAL RESULT CALLED TO, READ BACK BY AND VERIFIED WITH:     MAIN RN 9:40 09/29/13 ([email or web address])     [facility]  MRSA PCR SCREENING     Status: Abnormal   Collection Time    09/28/13  4:59 PM  Result Value Ref Range Status   MRSA by PCR POSITIVE (*) NEGATIVE Final   Comment:            The GeneXpert MRSA Assay (FDA     approved for NASAL specimens     only), is one component of a     comprehensive MRSA colonization     surveillance program. It is not     intended to diagnose MRSA     infection nor to guide or     monitor treatment for     MRSA infections.     RESULT CALLED TO, READ BACK BY AND VERIFIED WITH:     S.DILLON RN AT 1859 ON 19APR15 BY C.BONGEL  CULTURE, BLOOD (ROUTINE X 2)     Status: None   Collection Time    09/28/13  5:07 PM      Result Value Ref Range Status   Specimen Description BLOOD RIGHT ARM   Final   Special Requests BOTTLES DRAWN AEROBIC AND ANAEROBIC 9CC   Final   Culture  Setup Time     Final   Value: 09/28/2013 21:24     Performed at [facility]   Culture     Final   Value:        BLOOD CULTURE RECEIVED NO GROWTH TO DATE CULTURE WILL BE HELD FOR 5 DAYS BEFORE ISSUING A FINAL NEGATIVE REPORT      Performed at [facility]   Report Status PENDING   Incomplete    Anti-infectives:  4/19 CTX x 1  4/19 >> Fluconazole >> 4/22 4/19 >> Vanc >>  4/19 >> Zosyn >> 4/23 4/20 >> cephalexin >> 4/20  4/20 >> vanc PO >  4/20 >> flagyl IV >> 4/23 >> ampicillin >>   Assessment: 89 yoM with history of recurrent C.diff (completed course of tapered oral vancomycin on 4/15) presents with sepsis secondary to UTI and/or left leg cellulitis. Pharmacy initially consulted to dose Vancomycin and Zosyn which was de-escalated to cephalexin on 4/20 due to mild cellulitis case.  Continued on fluconazole for funguria and oral vanc/IV metronidazole for recurrent Cdiff.  Blood cultures with GPC pairs on 4/20 and antibiotics broadened back to Vancomycin and Zosyn per pharmacy dosing.  Blood cultures now w/ enterococcus and abx narrowed to ampicillin, pharmacy may adjust dosing.   Tmax: afebrile  WBC: 8.4  Renal: SCr 1.16, CrCl ~ 40 ml/min  Blood culture:  Enterococcus Gallinarum (Sens: ampicillin, gent.  Resistant: vanc) and Coag neg staph.  Remains on Vancomycin IV (Zosyn d/c, but completed 3 days txt with last dose 4/23 AM)  Remains on Vancomycin PO and Flagyl IV for recurrent Cdiff.   Goal of Therapy:  Appropriate abx dosing, eradication of infection.   Plan:   Ampicillin 2g IV q6h  Recommend D/C vancomycin IV  Follow up renal fxn and culture results.  Lynann Beaver PharmD, BCPS Pager [phone] 10/02/2013 12:53 PM

## 2013-10-02 NOTE — Progress Notes (Addendum)
PULMONARY / CRITICAL CARE MEDICINE   Name: Christiane HaCharles W Douse MRN: [id number] DOB: 02/06/1911    ADMISSION DATE:  09/28/2013 CONSULTATION DATE:  4/20  REFERRING MD :  Rito EhrlichKRISHNAN  PRIMARY SERVICE: [facility]   CHIEF COMPLAINT:  Septic shock  BRIEF PATIENT DESCRIPTION:  80 year old male admitted on 4/19 w/ recurrent PCM, RLE cellulitis and GPC bacteremia. Course complicated by what appeared to be demand ischemia and persistent shock state. PCCM asked to see on 4/20 for hypotension that did not respond to IVFs.   >pt is DNR. Agrees to central access and short term vaso-active gtts.   SIGNIFICANT EVENTS / STUDIES:    LINES / TUBES:   CULTURES: BCX2 4/19: coag neg SA (prob contamination)/ enterococcus gallinarum cdiff PCR 4/19: positive  MRSA PCR 4/19: positive   ANTIBIOTICS: Flagyl 4/19>>> Zosyn 4/19>>4/23 vanc 4/19>>> Oral vanc 4/19>>>  SUBJECTIVE:  Off pressors. Does have orthopnea when lying flat    VITAL SIGNS: Temp:  [97.4 F (36.3 C)-97.7 F (36.5 C)] 97.7 F (36.5 C) (04/23 0000) Pulse Rate:  [50-117] 102 (04/23 0500) Resp:  [17-24] 18 (04/23 0500) BP: (79-103)/(49-78) 101/74 mmHg (04/23 0500) SpO2:  [83 %-100 %] 99 % (04/23 0500) Room air -->2 liters  HEMODYNAMICS:   VENTILATOR SETTINGS:   INTAKE / OUTPUT:  Intake/Output Summary (Last 24 hours) at 10/02/13 0938 Last data filed at 10/02/13 0800  Gross per 24 hour  Intake 1608.53 ml  Output    825 ml  Net 783.53 ml    PHYSICAL EXAMINATION: General: was in distress, now lethargic and not terribly arousable Neuro:  No focal def  HEENT:  [place], no JVD  Cardiovascular:  rrr Lungs:  Crackles in bases, accessory muscle use has improved w/ O2 Abdomen:  Soft, still inc stool + bowel sounds Musculoskeletal:  Intact  Skin:  RLE erythremic   LABS:  CBC  Recent Labs Lab 09/30/13 1100 10/01/13 0320 10/02/13 0325  WBC 8.0 12.4* 8.4  HGB 10.3* 10.6* 10.7*  HCT 31.0* 32.1* 33.5*  PLT 72* 92* 95*    Coag's  Recent Labs Lab 09/29/13 0345  INR 1.42   BMET  Recent Labs Lab 09/30/13 0400 10/01/13 0320 10/02/13 0325  NA 141 141 142  K 3.6* 3.0* 3.9  CL 108 109 111  CO2 19 20 20   BUN 28* 32* 32*  CREATININE 1.21 1.18 1.16  GLUCOSE 293* 153* 181*   Electrolytes  Recent Labs Lab 09/29/13 0345 09/30/13 0400 10/01/13 0320 10/02/13 0325  CALCIUM 7.7* 7.6* 7.4* 7.9*  MG 1.8  --   --   --   PHOS 3.4  --   --   --    Sepsis Markers  Recent Labs Lab 09/29/13 1705  LATICACIDVEN 3.5*   ABG No results found for this basename: PHART, PCO2ART, PO2ART,  in the last 168 hours Liver Enzymes  Recent Labs Lab 09/28/13 1142 09/29/13 0345 10/01/13 0320  AST 20 20 19   ALT 10 10 15   ALKPHOS 53 46 52  BILITOT 1.0 0.8 <0.2*  ALBUMIN 3.2* 2.6* 2.4*   Cardiac Enzymes  Recent Labs Lab 09/29/13 2102 09/30/13 0400 10/01/13 0320  TROPONINI 1.01* 1.26* 0.92*   Glucose  Recent Labs Lab 09/30/13 1729 09/30/13 2243 10/01/13 0855 10/01/13 1218 10/01/13 1610 10/01/13 2115  GLUCAP 230* 196* 145* 144* 141* 170*    Imaging Dg Chest Port 1 View  10/02/2013   CLINICAL DATA:  Evaluate atelectasis  EXAM: PORTABLE CHEST - 1 VIEW  COMPARISON:  DG CHEST 1V PORT dated 09/29/2013; DG CHEST 1V PORT dated 06/16/2013; DG CHEST 1V PORT dated 06/14/2013; DG CHEST 2 VIEW dated 05/20/2013  FINDINGS: Grossly unchanged enlarged cardiac silhouette and mediastinal contours post median sternotomy and CABG. Stable positioning of support apparatus. The pulmonary vasculature appears less distinct on present examination with cephalization of flow. Minimal increase in size of trace bilateral effusions and associated worsening bibasilar opacities, left greater than right. No pneumothorax. Unchanged bones.  IMPRESSION: Worsening pulmonary edema and bibasilar atelectasis.   Electronically Signed   By: Simonne Come M.D.   On: 10/02/2013 07:34  vasc congestion/edema. Left effusion     ASSESSMENT /  PLAN:  PULMONARY A: Post resuscitation pulmonary edema and pleural effusions  P:   Cont pulse ox  O2 as needed  Aim for even to negative fluid balance  Low dose lasix X 1    CARDIOVASCULAR A: Septic shock-->off pressors  NSTEMI vs demand ischemia >not a candidate, nor would he agree to anything more than medical management re: demand ischemia  Systolic heart failure. EF 35-40% H/o af. Currently NSR P:  See ID section Low dose lasix Accept SBP 85-90s Could consider low dose BB when SBP improved.   RENAL A:   No acute injury  P:   Renal dose meds   GASTROINTESTINAL A:   PMC P:   Adv diet as tol See ID section   HEMATOLOGIC A:   Anemia. Suspect hgb drift over last 24 hrs reflects hemodilution  P:  Monitor h&h w/ heparin    INFECTIOUS A:   Cellulitis  1 isolated BC positive for Enterococcus Gallinarum and coag neg SA (suspect the coag neg staph is a contaminant) C diff colitis  Septic shock due to above  P:   Cont rx for c-diff Agree w/ narrowing coverage to vanc re: bacteremia   ENDOCRINE A:   Dm w/ hyperglycemia  Hypothyroidism  P:   Ssi synthroid  NEUROLOGIC A:  No acute  P:   Supportive care   TODAY'S SUMMARY: Clinically a little better. His systolic dysfxn and post-resuscitation pulmonary edema are probably the biggest issues to his symptom burden today. Ideally would hold off on lasix until BP stable for > 24 hrs but given hypoxic episode when flat and worsening edema/effusion on PCXR think we are compelled to rx. Will give him one time lasix. Agree w/ rest of plan as outlined by primary service re: ABX.  Attending addendum: Called by bedside RN, patient is much more lethargic, not terribly responsive.  BP remains marginal, just got off pressors but may have to restart.  He is DNR so will not get an ABG right now as will not intubate.  If continues to deteriorate then will transition to comfort care.  CC time 35 min.  Patient seen and  examined, agree with above note.  I dictated the care and orders written for this patient under my direction.  Alyson Reedy, MD [phone]

## 2013-10-02 NOTE — Progress Notes (Addendum)
[facility] Progress Note  TEAM 1 - Stepdown/ICU TEAM   David HaCharles W Lien ION:[id number] DOB: 12/03/1910 DOA: 09/28/2013 PCP: Thayer HeadingsMACKENZIE,BRIAN, MD  Brief narrative: David Davidson is a [age] y.o. male presenting on 09/28/2013 past medical history of CAD, diabetes, CK-MB and recent C. difficile-completed oral vancomycin course on 4/16 who had an extended stay in December of 2014 for congestive heart failure and a non-ST elevated MI, medically managed. He presented to the emergency room on 4/21 with generalized weakness, fever and frequent falls. He was found to be hypotensive with a mild leukocytosis and cellulitis of the left lower extremity, yeast in urine and diarrhea which became positive for C. difficile again. His troponins were also mildly elevated. Patient was admitted for septic shock  Patient was placed in the step down unit and started on by mouth vancomycin/IV Flagyl for C. difficile, Diflucan for candiduria. Patient's blood cultures preliminary growing out gram-positive cocci in clusters-presumably strep, most likely source is his cellulitis and he is on vancomycin/Zosyn for this. Despite aggressive fluid boluses on 4/20, patient remained hypotensive and critical care was consulted for central line placement and started on pressor support. Patient tolerated this well and pressors have been weaned off on 4/20 one more week.  Lastly, the patient's troponins trended upward. This was discussed with cardiology. Patient was felt to be having a non-STEMI secondary to demand ischemia brought on by septic shock and secondary hypotension. Cardiology recommendation was for treating underlying issue. Heparin has been started as per pharmacy. Patient overall clinically improved on 4/21 and more stable.   Subjective: Continues to do well. Has no complaints.   Assessment/Plan: Principal Problem:   Severe sepsis with septic shock -Levophed just held-  - transfer out of ICU as BP is  stable (a) Recurrent colitis due to Clostridium difficile - C. difficile cultures positive. Given his severe illness on by mouth vancomycin plus IV Flagyl.  (b) UTI - funguria -Continue Diflucan:  (c)Cellulitis - left leg- appears to have resolved  - 1/2 sets of positive blood cultures- for coag neg staph and enterococcus gallinarium - cont VAnc for now- d/c Zosyn and start Ampicllin - will ask for ID consult.  Hypotension with a h/o HTN - Secondary septic shock - Treated with aggressive fluid boluses and then pressor support-   Non-ST elevated MI- likely secondary to septic shock Dr Rito EhrlichKrishnan discussed with cardiology and recommendations were to Cycle troponins, heparin drip - Troponin bumped slightly but now seem to be improving - T max 1.26 - Troponins cont to trend down- will d/c Heparin today (started on 4/20)    Paroxysmal a-fib - Currently in normal sinus rhythm  - does not appear to be on chronic anticoagulation  Mildly severe Ao Stenosis - follow- not a surgical candidate  Chronic systolic CHF with EF of 35-40% - ECHO 2/14 - compensated  DM (diabetes mellitus), type 2 Cont Lantus and novolog sliding scale  S/P CABG   Hyperlipidemia - Stable   Hypothyroidism - Continue Synthroid   Thrombocytopenia - The patient has a history of low platelets. Initially, pharmacy wanted to stop prophylactic heparin. However given concerns of non-STEMI, full dose heparin started. PWe'll continue to monitor platelet counts    Code Status: DNR Family Communication: none Disposition Plan: to be determined by PT eval  Consultants: ICU  Procedures: Central line- 4/20  Antibiotics: IV vancomycin 4/19-present  IV Zosyn 4/19-present  By mouth vancomycin 4/20-present  By mouth Keflex times one dose 4/20  IV Flagyl  4/20-present  Oral Diflucan 4/19-present    DVT prophylaxis: On heparin infusion  Objective: Filed Weights   09/29/13 0500 09/30/13 0422 10/01/13 0352   Weight: 88 kg (194 lb 0.1 oz) 91.7 kg (202 lb 2.6 oz) 93.2 kg (205 lb 7.5 oz)   Blood pressure 95/64, pulse 103, temperature 97.7 F (36.5 C), temperature source Oral, resp. rate 18, height 6' (1.829 m), weight 93.2 kg (205 lb 7.5 oz), SpO2 95.00%.  Intake/Output Summary (Last 24 hours) at 10/02/13 0959 Last data filed at 10/02/13 0900  Gross per 24 hour  Intake 1648.53 ml  Output    825 ml  Net 823.53 ml     Exam: General: No acute respiratory distress Lungs: Clear to auscultation bilaterally without wheezes or crackles Cardiovascular: Regular rate and rhythm without murmur gallop or rub normal S1 and S2 Abdomen: Nontender, nondistended, soft, bowel sounds positive, no rebound, no ascites, no appreciable mass Extremities: No significant cyanosis, clubbing, + edema R > L  Data Reviewed: Basic Metabolic Panel:  Recent Labs Lab 09/28/13 1142 09/29/13 0345 09/30/13 0400 10/01/13 0320 10/02/13 0325  NA 142 145 141 141 142  K 4.0 3.6* 3.6* 3.0* 3.9  CL 104 112 108 109 111  CO2 22 19 19 20 20   GLUCOSE 169* 173* 293* 153* 181*  BUN 18 20 28* 32* 32*  CREATININE 0.92 1.02 1.21 1.18 1.16  CALCIUM 9.0 7.7* 7.6* 7.4* 7.9*  MG  --  1.8  --   --   --   PHOS  --  3.4  --   --   --    Liver Function Tests:  Recent Labs Lab 09/28/13 1142 09/29/13 0345 10/01/13 0320  AST 20 20 19   ALT 10 10 15   ALKPHOS 53 46 52  BILITOT 1.0 0.8 <0.2*  PROT 6.7 6.1 5.6*  ALBUMIN 3.2* 2.6* 2.4*   No results found for this basename: LIPASE, AMYLASE,  in the last 168 hours No results found for this basename: AMMONIA,  in the last 168 hours CBC:  Recent Labs Lab 09/28/13 1142 09/29/13 0345 09/30/13 1100 10/01/13 0320 10/02/13 0325  WBC 11.3* 9.1 8.0 12.4* 8.4  NEUTROABS 8.1* 6.7  --   --   --   HGB 11.1* 11.0* 10.3* 10.6* 10.7*  HCT 33.8* 33.5* 31.0* 32.1* 33.5*  MCV 97.7 98.5 97.8 96.7 98.8  PLT 103* 82* 72* 92* 95*   Cardiac Enzymes:  Recent Labs Lab 09/28/13 1142   09/28/13 1750 09/28/13 2246 09/29/13 1542 09/29/13 2102 09/30/13 0400 10/01/13 0320  CKTOTAL 53  --  60 58  --   --   --   --   CKMB  --   --  5.9* 4.9*  --   --   --   --   TROPONINI  --   < > 0.87* 1.42* 0.72* 1.01* 1.26* 0.92*  < > = values in this interval not displayed. BNP (last 3 results)  Recent Labs  05/16/13 1403 05/20/13 0606 06/14/13 1150  PROBNP 2658.0* 1575.0* 879.1*   CBG:  Recent Labs Lab 10/01/13 0855 10/01/13 1218 10/01/13 1610 10/01/13 2115 10/02/13 0815  GLUCAP 145* 144* 141* 170* 158*    Recent Results (from the past 240 hour(s))  CULTURE, BLOOD (ROUTINE X 2)     Status: None   Collection Time    09/28/13 11:42 AM      Result Value Ref Range Status   Specimen Description BLOOD LEFT ARM  5 ML IN [facility] BOTTLE  Final   Special Requests NONE   Final   Culture  Setup Time     Final   Value: 09/28/2013 21:24     Performed at [facility]   Culture     Final   Value: ENTEROCOCCUS GALLINARUM     STAPHYLOCOCCUS SPECIES (COAGULASE NEGATIVE)     Note: THE SIGNIFICANCE OF ISOLATING THIS ORGANISM FROM A SINGLE SET OF BLOOD CULTURES WHEN MULTIPLE SETS ARE DRAWN IS UNCERTAIN. PLEASE NOTIFY THE MICROBIOLOGY DEPARTMENT WITHIN ONE WEEK IF SPECIATION AND SENSITIVITIES ARE REQUIRED.     Note: Gram Stain Report Called to,Read Back By and Verified With: GAIL S@11 :51AM ON 09/29/13 BY DANTS     Performed at [facility]   Report Status 10/02/2013 FINAL   Final   Organism ID, Bacteria ENTEROCOCCUS GALLINARUM   Final  URINE CULTURE     Status: None   Collection Time    09/28/13 11:59 AM      Result Value Ref Range Status   Specimen Description URINE, RANDOM   Final   Special Requests Immunocompromised   Final   Culture  Setup Time     Final   Value: 09/28/2013 19:47     Performed at [facility] Count     Final   Value: 15,000 COLONIES/ML     Performed at [facility]   Culture     Final   Value: Multiple bacterial  morphotypes present, none predominant. Suggest appropriate recollection if clinically indicated.     Performed at [facility]   Report Status 09/29/2013 FINAL   Final  CLOSTRIDIUM DIFFICILE BY PCR     Status: Abnormal   Collection Time    09/28/13  4:59 PM      Result Value Ref Range Status   C difficile by pcr POSITIVE (*) NEGATIVE Final   Comment: CRITICAL RESULT CALLED TO, READ BACK BY AND VERIFIED WITH:     MAIN RN 9:40 09/29/13 ([email or web address])     Performed at [facility]  MRSA PCR SCREENING     Status: Abnormal   Collection Time    09/28/13  4:59 PM      Result Value Ref Range Status   MRSA by PCR POSITIVE (*) NEGATIVE Final   Comment:            The GeneXpert MRSA Assay (FDA     approved for NASAL specimens     only), is one component of a     comprehensive MRSA colonization     surveillance program. It is not     intended to diagnose MRSA     infection nor to guide or     monitor treatment for     MRSA infections.     RESULT CALLED TO, READ BACK BY AND VERIFIED WITH:     S.DILLON RN AT 1859 ON [id number] BY C.BONGEL  CULTURE, BLOOD (ROUTINE X 2)     Status: None   Collection Time    09/28/13  5:07 PM      Result Value Ref Range Status   Specimen Description BLOOD RIGHT ARM   Final   Special Requests BOTTLES DRAWN AEROBIC AND ANAEROBIC [facility]   Final   Culture  Setup Time     Final   Value: 09/28/2013 21:24     Performed at [facility]   Culture     Final   Value:        BLOOD  CULTURE RECEIVED NO GROWTH TO DATE CULTURE WILL BE HELD FOR 5 DAYS BEFORE ISSUING A FINAL NEGATIVE REPORT     Performed at [facility]   Report Status PENDING   Incomplete     Studies:  Recent x-ray studies have been reviewed in detail by the Attending Physician  Scheduled Meds:  Scheduled Meds: . aspirin EC  81 mg Oral Daily  . atorvastatin  10 mg Oral QHS  . Chlorhexidine Gluconate Cloth  6 each Topical Q0600  . feeding supplement (GLUCERNA SHAKE)  237 mL Oral  TID WC  . finasteride  5 mg Oral QHS  . furosemide  40 mg Intravenous Once  . heparin subcutaneous  5,000 Units Subcutaneous 3 times per day  . insulin aspart  0-15 Units Subcutaneous TID WC  . insulin glargine  6 Units Subcutaneous Daily  . levothyroxine  50 mcg Oral QAC breakfast  . vancomycin  500 mg Oral 4 times per day   And  . metronidazole  500 mg Intravenous 3 times per day  . mirtazapine  15 mg Oral QHS  . multivitamin with minerals  1 tablet Oral q morning - 10a  . mupirocin ointment  1 application Nasal BID  . potassium chloride  40 mEq Oral Once  . saccharomyces boulardii  250 mg Oral BID  . sodium chloride  3 mL Intravenous Q12H  . tamsulosin  0.4 mg Oral QPC supper  . vancomycin  750 mg Intravenous Q12H   Continuous Infusions:    Time spent on care of this patient: 35 min   Calvert Cantor, MD 10/02/2013, 9:59 AM  LOS: 4 days   [facility] Office  [phone] Pager - Text Page per Loretha Stapler   If 7PM-7AM, please contact night-coverage [email or web address]

## 2013-10-02 NOTE — Progress Notes (Signed)
Patient transferring to room 1322.  Report called to Onalee Hua, RN.  Patient to travel by bed.  Will continue to monitor.

## 2013-10-02 NOTE — Progress Notes (Signed)
Patient ID: David Davidson, male   DOB: 1911-06-12, 80 y.o.   MRN: [id number]         [facility]    Date of Admission:  09/28/2013   Day 5 IV vancomycin        Day 5 oral vancomycin        Day 4 IV metronidazole        Piperacillin tazobactam 4/19-22        Day 1 ampicillin  Principal Problem:   Recurrent colitis due to Clostridium difficile Active Problems:   Severe sepsis with septic shock   Positive blood culture   Cellulitis   CAD (coronary artery disease)   Paroxysmal a-fib   DM (diabetes mellitus), type 2, uncontrolled with complications   Hypertension   S/P CABG (coronary artery bypass graft)   Hyperlipidemia   Hypothyroidism   Thrombocytopenia   Benign prostatic hypertrophy with urinary frequency   Hypotension   Troponin level elevated   UTI (lower urinary tract infection)   Chronic systolic CHF (congestive heart failure)   Severe aortic stenosis   . ampicillin (OMNIPEN) IV  2 g Intravenous 4 times per day  . aspirin EC  81 mg Oral Daily  . atorvastatin  10 mg Oral QHS  . Chlorhexidine Gluconate Cloth  6 each Topical Q0600  . feeding supplement (GLUCERNA SHAKE)  237 mL Oral TID WC  . finasteride  5 mg Oral QHS  . heparin subcutaneous  5,000 Units Subcutaneous 3 times per day  . insulin aspart  0-15 Units Subcutaneous TID WC  . insulin glargine  6 Units Subcutaneous Daily  . levothyroxine  50 mcg Oral QAC breakfast  . vancomycin  500 mg Oral 4 times per day   And  . metronidazole  500 mg Intravenous 3 times per day  . mirtazapine  15 mg Oral QHS  . multivitamin with minerals  1 tablet Oral q morning - 10a  . mupirocin ointment  1 application Nasal BID  . saccharomyces boulardii  250 mg Oral BID  . sodium chloride  3 mL Intravenous Q12H  . tamsulosin  0.4 mg Oral QPC supper  . vancomycin  750 mg Intravenous Q12H    Subjective: [name] has a history of recurrent C. difficile colitis. He underwent fecal microbiological  transplant in August of 2013 and this seemed to stop his frequent recurrences of colitis. However, he had several hospitalizations last year or and received some antibiotic therapy for lower extremity cellulitis and possible pneumonia. This began to trigger more relapses. He was seen last month by my partner, Dr. Judyann Munson, who has been planning a second fecal transplant. He stopped his most recent course of oral vancomycin on April 15. He suffered several falls on April 19 became very weak leading to this admission. He was hypotensive on admission and started having diarrhea the following day. One of 2 admission blood cultures have grown Enterococcus gallinarum and coag negative staph. He was noted to have right lower extremity erythema on admission. He does not recall having any pain. He has frequent urination and nocturia but denies any recent dysuria or other urinary changes. He is feeling better but says that he is just worn out. His appetite remains extremely poor. He lost about 25 pounds last year but does not think he has been losing recently.  Time line of hospitalizations and Clostridium difficile recurrences:  Initial infection:  12/31-1/9 vanco 125mg  PO QID x 14d  Recurrences:  Jan 13- 18 hospitalized, discharged on vanco 125mg  PO QID x 14d  Feb 2013- treated as o/p by PCP with metronidazole 500 TID x 14 d  Mar 6-11, hospitalized, discharged on vancomycin taper: 125 QID x 7 addn day, 125 BID x 7d, 125 daily x 7d, 125 QOD x 7d  Mar 29-April 4th, hospitalized, discharged on vancomycin 125 mg PO QID x 14 , and rifaximin taper  May 2013- prolonged vancomycin taper(as detailed above), florastor  June 2013- prolonged vancomycin taper (as detailed above), florastor  August 2013- 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 C. difficile PCR is positive June 14, 2013- C. difficile PCR positive September 28, 2013- C. difficile PCR positive  Review of Systems: Pertinent items are noted in HPI.  Past Medical History  Diagnosis Date  . Myocardial infarct   . Diabetes mellitus   . Hypertension   . CHF (congestive heart failure)   . Coronary artery disease   . Clostridium difficile diarrhea     recurrent   . Hyperlipidemia   . Pneumonia 04/2011  . Shortness of breath     only with my heart failure "  . Chronic kidney disease   . Dysrhythmia   . Paroxysmal atrial fibrillation   . DNR (do not resuscitate)     History  Substance Use Topics  . Smoking status: Former Smoker -- 1.00 packs/day    Types: Cigarettes    Quit date: 05/08/1974  . Smokeless tobacco: Never Used  . Alcohol Use: No     Comment: occasionally    Family History  Problem Relation Age of Onset  . Stomach cancer Mother   . Coronary artery disease Father   . Diabetes Son   . Colon cancer Neg Hx     Allergies  Allergen Reactions  . Colestipol Other (See Comments)    "Blacked Out"   . Morphine And Related Other (See Comments)    Reaction: hallucinations    Objective: Temp:  [97.7 F (36.5 C)-98.3 F (36.8 C)] 98.1 F (36.7 C) (04/23 1200) Pulse Rate:  [47-117] 110 (04/23 1600) Resp:  [17-26] 22 (04/23 1600) BP: (79-122)/(49-80) 121/59 mmHg (04/23 1600) SpO2:  [83 %-100 %] 100 % (04/23 1600)  General: He is alert and in no distress. He is visiting with his son and daughter-in-law. He is hard of hearing Skin: No acute rash Lungs: Clear Cor: Distant but regular S1 and S2 with no murmur Abdomen: Flat, soft and nontender with quiet bowel sounds Joints and extremities: He has a large, chronic soft tissue mass in the area of the left olecranon bursa. He has venous stasis dermatitis of both lower extremities with dry skin. No active  cellulitis is noted  Lab Results Lab Results  Component Value Date   WBC 8.4 10/02/2013   HGB 10.7* 10/02/2013   HCT 33.5* 10/02/2013   MCV 98.8 10/02/2013   PLT 95* 10/02/2013    Lab Results  Component Value Date   CREATININE 1.16 10/02/2013   BUN 32* 10/02/2013   NA 142 10/02/2013   K 3.9 10/02/2013   CL 111 10/02/2013   CO2 20 10/02/2013    Lab Results  Component Value Date   ALT 15 10/01/2013   AST 19 10/01/2013   ALKPHOS 52 10/01/2013   BILITOT <0.2*  10/01/2013      Microbiology: Recent Results (from the past 240 hour(s))  CULTURE, BLOOD (ROUTINE X 2)     Status: None   Collection Time    09/28/13 11:42 AM      Result Value Ref Range Status   Specimen Description BLOOD LEFT ARM  5 ML IN [facility] BOTTLE   Final   Special Requests NONE   Final   Culture  Setup Time     Final   Value: 09/28/2013 21:24     Performed at [facility]   Culture     Final   Value: ENTEROCOCCUS GALLINARUM     STAPHYLOCOCCUS SPECIES (COAGULASE NEGATIVE)     Note: THE SIGNIFICANCE OF ISOLATING THIS ORGANISM FROM A SINGLE SET OF BLOOD CULTURES WHEN MULTIPLE SETS ARE DRAWN IS UNCERTAIN. PLEASE NOTIFY THE MICROBIOLOGY DEPARTMENT WITHIN ONE WEEK IF SPECIATION AND SENSITIVITIES ARE REQUIRED.     Note: Gram Stain Report Called to,Read Back By and Verified With: GAIL S@11 :51AM ON 09/29/13 BY DANTS     Performed at [facility]   Report Status 10/02/2013 FINAL   Final   Organism ID, Bacteria ENTEROCOCCUS GALLINARUM   Final  URINE CULTURE     Status: None   Collection Time    09/28/13 11:59 AM      Result Value Ref Range Status   Specimen Description URINE, RANDOM   Final   Special Requests Immunocompromised   Final   Culture  Setup Time     Final   Value: 09/28/2013 19:47     Performed at [facility] Count     Final   Value: 15,000 COLONIES/ML     Performed at [facility]   Culture     Final   Value: Multiple bacterial morphotypes present, none predominant. Suggest  appropriate recollection if clinically indicated.     Performed at [facility]   Report Status 09/29/2013 FINAL   Final  CLOSTRIDIUM DIFFICILE BY PCR     Status: Abnormal   Collection Time    09/28/13  4:59 PM      Result Value Ref Range Status   C difficile by pcr POSITIVE (*) NEGATIVE Final   Comment: CRITICAL RESULT CALLED TO, READ BACK BY AND VERIFIED WITH:     MAIN RN 9:40 09/29/13 ([email or web address])     Performed at [facility]  MRSA PCR SCREENING     Status: Abnormal   Collection Time    09/28/13  4:59 PM      Result Value Ref Range Status   MRSA by PCR POSITIVE (*) NEGATIVE Final   Comment:            The GeneXpert MRSA Assay (FDA     approved for NASAL specimens     only), is one component of a     comprehensive MRSA colonization     surveillance program. It is not     intended to diagnose MRSA     infection nor to guide or     monitor treatment for     MRSA infections.     RESULT CALLED TO, READ BACK BY AND VERIFIED WITH:     S.DILLON RN AT 1859 ON [id number] BY C.BONGEL  CULTURE, BLOOD (ROUTINE X 2)     Status: None   Collection Time    09/28/13  5:07 PM      Result Value Ref Range Status   Specimen Description BLOOD RIGHT  ARM   Final   Special Requests BOTTLES DRAWN AEROBIC AND ANAEROBIC 9CC   Final   Culture  Setup Time     Final   Value: 09/28/2013 21:24     Performed at [facility]   Culture     Final   Value:        BLOOD CULTURE RECEIVED NO GROWTH TO DATE CULTURE WILL BE HELD FOR 5 DAYS BEFORE ISSUING A FINAL NEGATIVE REPORT     Performed at [facility]   Report Status PENDING   Incomplete    Studies/Results: Dg Chest Port 1 View  10/02/2013   CLINICAL DATA:  Evaluate atelectasis  EXAM: PORTABLE CHEST - 1 VIEW  COMPARISON:  DG CHEST 1V PORT dated 09/29/2013; DG CHEST 1V PORT dated 06/16/2013; DG CHEST 1V PORT dated 06/14/2013; DG CHEST 2 VIEW dated 05/20/2013  FINDINGS: Grossly unchanged enlarged cardiac silhouette and mediastinal contours  post median sternotomy and CABG. Stable positioning of support apparatus. The pulmonary vasculature appears less distinct on present examination with cephalization of flow. Minimal increase in size of trace bilateral effusions and associated worsening bibasilar opacities, left greater than right. No pneumothorax. Unchanged bones.  IMPRESSION: Worsening pulmonary edema and bibasilar atelectasis.   Electronically Signed   By: Simonne Come M.D.   On: 10/02/2013 07:34    Assessment: I suspect the primary problem here is another recurrence of C. difficile colitis and will continue high-dose oral vancomycin. I do not feel that he needs IV metronidazole. He may have had left lower leg cellulitis on admission but that has now resolved. I would not continue to treat it. It is hard to know what to make of the one positive blood culture. I am not particularly concerned about the coagulase negative staph but I cannot rule out transient enterococcal bacteremia. I will continue IV ampicillin but will consider a short course of therapy (around 7 days total) if there is no evidence of endocarditis by TTE. He is aware that systemic antibiotic therapy with drugs such as ampicillin will make it more difficult to control his very recalcitrant C. difficile colitis.  Plan: 1. Continue oral vancomycin 2. Continue IV ampicillin 3. Discontinue IV vancomycin and IV metronidazole 4. Transthoracic echocardiogram  Cliffton Asters, MD [facility] [phone] pager   [phone] cell 10/02/2013, 5:01 PM

## 2013-10-02 NOTE — Progress Notes (Signed)
ANTICOAGULATION CONSULT NOTE - Follow Up Consult  Pharmacy Consult for Heparin  Indication: NSTEMI  Allergies  Allergen Reactions  . Colestipol Other (See Comments)    "Blacked Out"   . Morphine And Related Other (See Comments)    Reaction: hallucinations    Patient Measurements: Height: 6' (182.9 cm) Weight: 205 lb 7.5 oz (93.2 kg) IBW/kg (Calculated) : 77.6 Heparin Dosing Weight: 93.2 kg  Vital Signs: Temp: 97.7 F (36.5 C) (04/23 0000) Temp src: Oral (04/23 0000) BP: 101/74 mmHg (04/23 0500) Pulse Rate: 102 (04/23 0500)  Labs:  Recent Labs  09/29/13 2102  09/30/13 0400  09/30/13 1100  10/01/13 0320 10/01/13 1249 10/01/13 2220 10/02/13 0325  HGB  --   --   --   < > 10.3*  --  10.6*  --   --  10.7*  HCT  --   --   --   --  31.0*  --  32.1*  --   --  33.5*  PLT  --   --   --   --  72*  --  92*  --   --  95*  HEPARINUNFRC  --   < >  --   --  0.15*  < > 0.28* 0.48 0.52 0.69  CREATININE  --   --  1.21  --   --   --  1.18  --   --  1.16  TROPONINI 1.01*  --  1.26*  --   --   --  0.92*  --   --   --   < > = values in this interval not displayed.  Estimated Creatinine Clearance: 40.1 ml/min (by C-G formula based on Cr of 1.16).   Medications:  Infusions:  . heparin 1,500 Units/hr (10/01/13 0415)  . norepinephrine (LEVOPHED) Adult infusion 2 mcg/min (10/01/13 2330)    Assessment: 89 yoM admitted 4/19 with sepsis (UTI, cellulitis, bacteremia). PMH significant for CAD s/p MI and CABG, systolic CHF with EF 35%, severe aortic stenosis, paroxysmal atrial flutter, HTN, HLD, DM, CKD. Now concerning for NSTEMI with elevated troponins (0.87, 1.42) vs demand ischemia. History of thrombocytopenia is known (baseline ~ 80-110k). Pharmacy is consulted to start heparin infusion.  Troponins: 0.87, 1.42, 0.72, 1.01, 1.26, 0.92  SCr 1.16, CrCl ~ 40 ml/min.  CBC: Hgb 10.7, stable.  Known thrombocytopenia, Plt improved to 95.  No bleeding or complications are reported.    Heparin level: 0.69, within goal range, but at upper limit and trending up.   Goal of Therapy:  Heparin level 0.3-0.7 units/ml Monitor platelets by anticoagulation protocol: Yes   Plan:   Discontinue heparin infusion and labs per MD.   Lynann Beaver PharmD, BCPS Pager [phone] 10/02/2013 7:26 AM

## 2013-10-03 DIAGNOSIS — I369 Nonrheumatic tricuspid valve disorder, unspecified: Secondary | ICD-10-CM

## 2013-10-03 DIAGNOSIS — R7881 Bacteremia: Secondary | ICD-10-CM

## 2013-10-03 LAB — BASIC METABOLIC PANEL
BUN: 24 mg/dL — ABNORMAL HIGH (ref 6–23)
CO2: 25 mEq/L (ref 19–32)
Calcium: 8.1 mg/dL — ABNORMAL LOW (ref 8.4–10.5)
Chloride: 109 mEq/L (ref 96–112)
Creatinine, Ser: 0.98 mg/dL (ref 0.50–1.35)
GFR calc non Af Amer: 65 mL/min — ABNORMAL LOW (ref >90)
GFR, EST AFRICAN AMERICAN: 76 mL/min — AB (ref >90)
GLUCOSE: 121 mg/dL — AB (ref 70–99)
POTASSIUM: 4.3 meq/L (ref 3.7–5.3)
SODIUM: 143 meq/L (ref 137–147)

## 2013-10-03 LAB — GLUCOSE, CAPILLARY
GLUCOSE-CAPILLARY: 129 mg/dL — AB (ref 70–99)
GLUCOSE-CAPILLARY: 189 mg/dL — AB (ref 70–99)
GLUCOSE-CAPILLARY: 189 mg/dL — AB (ref 70–99)
Glucose-Capillary: 171 mg/dL — ABNORMAL HIGH (ref 70–99)

## 2013-10-03 LAB — CBC
HCT: 34 % — ABNORMAL LOW (ref 39.0–52.0)
HEMOGLOBIN: 11.3 g/dL — AB (ref 13.0–17.0)
MCH: 32.4 pg (ref 26.0–34.0)
MCHC: 33.2 g/dL (ref 30.0–36.0)
MCV: 97.4 fL (ref 78.0–100.0)
PLATELETS: 105 10*3/uL — AB (ref 150–400)
RBC: 3.49 MIL/uL — ABNORMAL LOW (ref 4.22–5.81)
RDW: 15.5 % (ref 11.5–15.5)
WBC: 7.7 10*3/uL (ref 4.0–10.5)

## 2013-10-03 MED ORDER — SODIUM CHLORIDE 0.9 % IJ SOLN
10.0000 mL | INTRAMUSCULAR | Status: DC | PRN
  Administered 2013-10-03 – 2013-10-05 (×3): 20 mL
  Administered 2013-10-06 – 2013-10-07 (×2): 10 mL

## 2013-10-03 NOTE — Progress Notes (Signed)
Clinical Social Work  [facility] is able to offer a bed. CSW informed patient and son who are excited that patient can return to a facility that he is familiar with. CSW will continue to follow and will assist with transfer to SNF once medically stable.  [place], [place] [phone]

## 2013-10-03 NOTE — Progress Notes (Signed)
Clinical Social Work Department BRIEF PSYCHOSOCIAL ASSESSMENT 10/03/2013  Patient:  David Davidson, David Davidson     Account Number:  [id number]     Admit date:  09/28/2013  Clinical Social Worker:  Earlie Server  Date/Time:  10/03/2013 09:30 AM  Referred by:  Physician  Date Referred:  10/03/2013 Referred for  SNF Placement   Other Referral:   Interview type:  Patient Other interview type:    PSYCHOSOCIAL DATA Living Status:  ALONE Admitted from facility:   Level of care:   Primary support name:  Juanda Crumble Primary support relationship to patient:  CHILD, ADULT Degree of support available:   Strong    CURRENT CONCERNS Current Concerns  Post-Acute Placement   Other Concerns:    SOCIAL WORK ASSESSMENT / PLAN CSW received referral in order to assist with DC planning. CSW reviewed chart and met with patient at bedside. CSW introduced myself and explained role.    Patient laying in bed and moaning when CSW arrived. Patient reports he does not feel well and is not interested in talking at this time. Patient asked that CSW contact his son and that son can assist as needed. CSW spoke with son via phone. Son reports that patient lives alone but that he and wife live next door. Son provides lunch and dinner for patient but patient is able to make his own breakfast. Son reports that patient was fairly independent prior to admission but now feels that patient needs additional assistance prior to returning home. Patient had a recent stay at [facility] in [place] where he completed about 10-12 days of rehab. Son reports that if space is available then he would prefer him to return there. CSW explained SNF process and agreeable to search [facility] in order to have additional options in case [facility] is unable to accept.    CSW left SNF list in the room. Son has CSW contact information. CSW faxed out to [facility] and spoke with Nira Conn in admissions at [facility] re: patient's interest in  returning. CSW will continue to follow and will assist as needed.   Assessment/plan status:  Psychosocial Support/Ongoing Assessment of Needs Other assessment/ plan:   Information/referral to community resources:   SNF information    PATIENT'S/FAMILY'S RESPONSE TO PLAN OF CARE: Patient alert but does not want to complete assessment. Patient feels that son can provide any information and make decisions on his behalf. Son engaged in assessment but worried if patient returns home alone at DC. Son was very pleased with care at [facility] and prefers patient to return to that facility at DC. Son aware that if [facility] is unable to accept then he will have to choose alternative SNF. Son engaged and asked appropriate questions. Son reports he cares deeply for patient and will come visit shortly.       [place], [place] [phone]

## 2013-10-03 NOTE — Progress Notes (Signed)
PT Cancellation Note  Patient Details Name: ROM ALVARDO MRN: [id number] DOB: 1911/02/24   Cancelled Treatment:    Reason Eval/Treat Not Completed: Fatigue/lethargy limiting ability to participate Pt reports exhaustion today and unable to participate.   Pt states maybe he could tolerate PT tomorrow.   Lynnell Catalan 10/03/2013, 10:35 AM Zenovia Jarred, PT, DPT 10/03/2013 Pager: [phone]

## 2013-10-03 NOTE — Progress Notes (Signed)
Echocardiogram 2D Echocardiogram has been performed.  Genene Churn Moneka Mcquinn 10/03/2013, 11:58 AM

## 2013-10-03 NOTE — Progress Notes (Signed)
Patient ID: David Davidson, male   DOB: 03/26/1911, 80 y.o.   MRN: [id number]         [facility]    Date of Admission:  09/28/2013           Day 6 enterococcal therapy        Day 6 oral vancomycin Principal Problem:   Recurrent colitis due to Clostridium difficile Active Problems:   Severe sepsis with septic shock   Positive blood culture   Cellulitis   CAD (coronary artery disease)   Paroxysmal a-fib   DM (diabetes mellitus), type 2, uncontrolled with complications   Hypertension   S/P CABG (coronary artery bypass graft)   Hyperlipidemia   Hypothyroidism   Thrombocytopenia   Benign prostatic hypertrophy with urinary frequency   Hypotension   Troponin level elevated   UTI (lower urinary tract infection)   Chronic systolic CHF (congestive heart failure)   Severe aortic stenosis   . ampicillin (OMNIPEN) IV  2 g Intravenous 4 times per day  . aspirin EC  81 mg Oral Daily  . atorvastatin  10 mg Oral QHS  . Chlorhexidine Gluconate Cloth  6 each Topical Q0600  . feeding supplement (GLUCERNA SHAKE)  237 mL Oral TID WC  . finasteride  5 mg Oral QHS  . heparin subcutaneous  5,000 Units Subcutaneous 3 times per day  . insulin aspart  0-15 Units Subcutaneous TID WC  . insulin glargine  6 Units Subcutaneous Daily  . levothyroxine  50 mcg Oral QAC breakfast  . mirtazapine  15 mg Oral QHS  . multivitamin with minerals  1 tablet Oral q morning - 10a  . saccharomyces boulardii  250 mg Oral BID  . sodium chloride  3 mL Intravenous Q12H  . tamsulosin  0.4 mg Oral QPC supper  . vancomycin  500 mg Oral 4 times per day    Subjective: He is still very weak but has not had any diarrhea today. He is not having any abdominal pain, nausea or fever.  Objective: Temp:  [97.4 F (36.3 C)-98.2 F (36.8 C)] 97.7 F (36.5 C) (04/24 1128) Pulse Rate:  [101-110] 101 (04/24 1128) Resp:  [20-26] 20 (04/24 1128) BP: (92-121)/(59-70) 99/65 mmHg (04/24 1128) SpO2:   [92 %-100 %] 92 % (04/24 1128)  General: He is alert and in no distress. Apparently, he was confused and sleepy this morning after receiving some Vicodin but this has resolved Skin: Scattered ecchymoses Lungs: Clear Cor: Regular S1 and S2 no murmurs Abdomen: Soft and nontender   Lab Results Lab Results  Component Value Date   WBC 7.7 10/03/2013   HGB 11.3* 10/03/2013   HCT 34.0* 10/03/2013   MCV 97.4 10/03/2013   PLT 105* 10/03/2013    Lab Results  Component Value Date   CREATININE 0.98 10/03/2013   BUN 24* 10/03/2013   NA 143 10/03/2013   K 4.3 10/03/2013   CL 109 10/03/2013   CO2 25 10/03/2013    Lab Results  Component Value Date   ALT 15 10/01/2013   AST 19 10/01/2013   ALKPHOS 52 10/01/2013   BILITOT <0.2* 10/01/2013      Microbiology: Recent Results (from the past 240 hour(s))  CULTURE, BLOOD (ROUTINE X 2)     Status: None   Collection Time    09/28/13 11:42 AM      Result Value Ref Range Status   Specimen Description BLOOD LEFT ARM  [facility]  Final   Special Requests NONE   Final   Culture  Setup Time     Final   Value: 09/28/2013 21:24     Performed at [facility]   Culture     Final   Value: ENTEROCOCCUS GALLINARUM     STAPHYLOCOCCUS SPECIES (COAGULASE NEGATIVE)     Note: THE SIGNIFICANCE OF ISOLATING THIS ORGANISM FROM A SINGLE SET OF BLOOD CULTURES WHEN MULTIPLE SETS ARE DRAWN IS UNCERTAIN. PLEASE NOTIFY THE MICROBIOLOGY DEPARTMENT WITHIN ONE WEEK IF SPECIATION AND SENSITIVITIES ARE REQUIRED.     Note: Gram Stain Report Called to,Read Back By and Verified With: GAIL S@11 :51AM ON 09/29/13 BY DANTS     Performed at [facility]   Report Status 10/02/2013 FINAL   Final   Organism ID, Bacteria ENTEROCOCCUS GALLINARUM   Final  URINE CULTURE     Status: None   Collection Time    09/28/13 11:59 AM      Result Value Ref Range Status   Specimen Description URINE, RANDOM   Final   Special Requests Immunocompromised   Final   Culture  Setup Time      Final   Value: 09/28/2013 19:47     Performed at [facility] Count     Final   Value: 15,000 COLONIES/ML     Performed at [facility]   Culture     Final   Value: Multiple bacterial morphotypes present, none predominant. Suggest appropriate recollection if clinically indicated.     Performed at [facility]   Report Status 09/29/2013 FINAL   Final  CLOSTRIDIUM DIFFICILE BY PCR     Status: Abnormal   Collection Time    09/28/13  4:59 PM      Result Value Ref Range Status   C difficile by pcr POSITIVE (*) NEGATIVE Final   Comment: CRITICAL RESULT CALLED TO, READ BACK BY AND VERIFIED WITH:     MAIN RN 9:40 09/29/13 ([email or web address])     Performed at [facility]  MRSA PCR SCREENING     Status: Abnormal   Collection Time    09/28/13  4:59 PM      Result Value Ref Range Status   MRSA by PCR POSITIVE (*) NEGATIVE Final   Comment:            The GeneXpert MRSA Assay (FDA     approved for NASAL specimens     only), is one component of a     comprehensive MRSA colonization     surveillance program. It is not     intended to diagnose MRSA     infection nor to guide or     monitor treatment for     MRSA infections.     RESULT CALLED TO, READ BACK BY AND VERIFIED WITH:     S.DILLON RN AT 1859 ON 19APR15 BY C.BONGEL  CULTURE, BLOOD (ROUTINE X 2)     Status: None   Collection Time    09/28/13  5:07 PM      Result Value Ref Range Status   Specimen Description BLOOD RIGHT ARM   Final   Special Requests BOTTLES DRAWN AEROBIC AND ANAEROBIC [facility]   Final   Culture  Setup Time     Final   Value: 09/28/2013 21:24     Performed at [facility]   Culture     Final   Value:        BLOOD  CULTURE RECEIVED NO GROWTH TO DATE CULTURE WILL BE HELD FOR 5 DAYS BEFORE ISSUING A FINAL NEGATIVE REPORT     Performed at [facility]   Report Status PENDING   Incomplete    Studies/Results: Dg Chest Port 1 View  10/02/2013   CLINICAL DATA:  Evaluate  atelectasis  EXAM: PORTABLE CHEST - 1 VIEW  COMPARISON:  DG CHEST 1V PORT dated 09/29/2013; DG CHEST 1V PORT dated 06/16/2013; DG CHEST 1V PORT dated 06/14/2013; DG CHEST 2 VIEW dated 05/20/2013  FINDINGS: Grossly unchanged enlarged cardiac silhouette and mediastinal contours post median sternotomy and CABG. Stable positioning of support apparatus. The pulmonary vasculature appears less distinct on present examination with cephalization of flow. Minimal increase in size of trace bilateral effusions and associated worsening bibasilar opacities, left greater than right. No pneumothorax. Unchanged bones.  IMPRESSION: Worsening pulmonary edema and bibasilar atelectasis.   Electronically Signed   By: Simonne Come M.D.   On: 10/02/2013 07:34   Transthoracic echocardiogram 10/03/2013: No vegetations or other evidence of endocarditis noted  Assessment: I spoke with my partner, Dr. Judyann Munson, who has been following him over the past year. She is in agreement with me to treat his possible, transient enterococcal bacteremia with 7 days of IV antibiotic therapy then stop and continue treatment for her relapsing C. difficile colitis.  Plan: 1. Continue IV ampicillin for one more day (I have placed stop order) 2. Continue current dose of oral vancomycin indefinitely 3. I will arrange followup with Dr. Drue Second in our clinic after discharge 4. Please call Dr. Enedina Finner [phone]) for any infectious disease questions this weekend  Cliffton Asters, MD [facility] [phone] pager   [phone] cell 10/03/2013, 2:12 PM

## 2013-10-03 NOTE — Progress Notes (Signed)
[facility] Progress Note [place] TEAM 1 - Stepdown/ICU TEAM   David Davidson ZOX:[id number] DOB: Aug 04, 1910 DOA: 09/28/2013 PCP: [name], MD  Brief narrative: David Davidson is a 80 y.o. male presenting on 09/28/2013 past medical history of CAD, diabetes, CK-MB and recent C. difficile-completed oral vancomycin course on 4/16 who had an extended stay in December of 2014 for congestive heart failure and a non-ST elevated MI, medically managed. He presented to the emergency room on 4/21 with generalized weakness, fever and frequent falls. He was found to be hypotensive with a mild leukocytosis and cellulitis of the left lower extremity, yeast in urine and diarrhea which became positive for C. difficile again. His troponins were also mildly elevated. Patient was admitted for septic shock  Patient was placed in the step down unit and started on by mouth vancomycin/IV Flagyl for C. difficile, Diflucan for candiduria. Patient's blood cultures preliminary growing out gram-positive cocci in clusters-presumably strep, most likely source is his cellulitis and he is on vancomycin/Zosyn for this. Despite aggressive fluid boluses on 4/20, patient remained hypotensive and critical care was consulted for central line placement and started on pressor support. Patient tolerated this well and pressors have been weaned off on 4/20 one more week.  Lastly, the patient's troponins trended upward. This was discussed with cardiology. Patient was felt to be having a non-STEMI secondary to demand ischemia brought on by septic shock and secondary hypotension. Cardiology recommendation was for treating underlying issue. Heparin has been started as per pharmacy. Patient overall clinically improved on 4/21 and more stable.   Subjective: Continues to do well. Family is present and would like to know why he is sleepy. It appears that he received Vicodin not too long ago and currently is quite sleepy. He feels some  muscle soreness but no pain.   Assessment/Plan: Principal Problem:   Severe sepsis with septic shock (a) Recurrent colitis due to Clostridium difficile - C. difficile cultures positive. Given his severe illness on by mouth vancomycin plus IV Flagyl. ID consutled and recommended to stop IV Flagyl  (b)Cellulitis - left leg- appears to have resolved  (c) bacteremia  - 1/2 sets of positive blood cultures- for coag neg staph and enterococcus gallinarium - started Ampicllin- TTE ordered - appreciate ID assistance  Hypotension with a h/o HTN - Secondary septic shock - Treated with aggressive fluid boluses and then pressor support-  - BP continues to be low in 90-100 systolic  Non-ST elevated MI- likely secondary to septic shock [name] discussed with cardiology and recommendations were to Cycle troponins, heparin drip - Troponin bumped slightly but now seem to be improving - T max 1.26 - Troponins cont to trend down- will d/c Heparin today (started on 4/20)    Paroxysmal a-fib - Currently in normal sinus rhythm  - does not appear to be on chronic anticoagulation  Mildly severe Ao Stenosis - follow- not a surgical candidate  Chronic systolic CHF with EF of 35-40% - ECHO 2/14 - compensated  DM (diabetes mellitus), type 2 Cont Lantus and novolog sliding scale  S/P CABG   Hyperlipidemia - Stable   Hypothyroidism - Continue Synthroid   Thrombocytopenia - The patient has a history of low platelets. Initially, pharmacy wanted to stop prophylactic heparin. However given concerns of non-STEMI, full dose heparin started. PWe'll continue to monitor platelet counts    Code Status: DNR Family Communication: with son and daughter today Disposition Plan: to be determined by PT eval  Consultants: ICU  Procedures: Central line- 4/20  Antibiotics: IV vancomycin 4/19-present  IV Zosyn 4/19-present  By mouth vancomycin 4/20-present  By mouth Keflex times one dose 4/20  IV  Flagyl 4/20-present  Oral Diflucan 4/19-present    DVT prophylaxis: S/c Lovenox  Objective: Filed Weights   09/29/13 0500 09/30/13 0422 10/01/13 0352  Weight: 88 kg (194 lb 0.1 oz) 91.7 kg (202 lb 2.6 oz) 93.2 kg (205 lb 7.5 oz)   Blood pressure 99/65, pulse 101, temperature 97.7 F (36.5 C), temperature source Oral, resp. rate 20, height 6' (1.829 m), weight 93.2 kg (205 lb 7.5 oz), SpO2 92.00%.  Intake/Output Summary (Last 24 hours) at 10/03/13 1259 Last data filed at 10/03/13 0526  Gross per 24 hour  Intake    490 ml  Output   1050 ml  Net   -560 ml     Exam: General: No acute respiratory distress Lungs: Clear to auscultation bilaterally without wheezes or crackles Cardiovascular: Regular rate and rhythm without murmur gallop or rub normal S1 and S2 Abdomen: Nontender, nondistended, soft, bowel sounds positive, no rebound, no ascites, no appreciable mass Extremities: No significant cyanosis, clubbing, + edema R > L  Data Reviewed: Basic Metabolic Panel:  Recent Labs Lab 09/29/13 0345 09/30/13 0400 10/01/13 0320 10/02/13 0325 10/03/13 0330  NA 145 141 141 142 143  K 3.6* 3.6* 3.0* 3.9 4.3  CL 112 108 109 111 109  CO2 19 19 20 20 25   GLUCOSE 173* 293* 153* 181* 121*  BUN 20 28* 32* 32* 24*  CREATININE 1.02 1.21 1.18 1.16 0.98  CALCIUM 7.7* 7.6* 7.4* 7.9* 8.1*  MG 1.8  --   --   --   --   PHOS 3.4  --   --   --   --    Liver Function Tests:  Recent Labs Lab 09/28/13 1142 09/29/13 0345 10/01/13 0320  AST 20 20 19   ALT 10 10 15   ALKPHOS 53 46 52  BILITOT 1.0 0.8 <0.2*  PROT 6.7 6.1 5.6*  ALBUMIN 3.2* 2.6* 2.4*   No results found for this basename: LIPASE, AMYLASE,  in the last 168 hours No results found for this basename: AMMONIA,  in the last 168 hours CBC:  Recent Labs Lab 09/28/13 1142 09/29/13 0345 09/30/13 1100 10/01/13 0320 10/02/13 0325 10/03/13 0330  WBC 11.3* 9.1 8.0 12.4* 8.4 7.7  NEUTROABS 8.1* 6.7  --   --   --   --   HGB  11.1* 11.0* 10.3* 10.6* 10.7* 11.3*  HCT 33.8* 33.5* 31.0* 32.1* 33.5* 34.0*  MCV 97.7 98.5 97.8 96.7 98.8 97.4  PLT 103* 82* 72* 92* 95* 105*   Cardiac Enzymes:  Recent Labs Lab 09/28/13 1142  09/28/13 1750 09/28/13 2246 09/29/13 1542 09/29/13 2102 09/30/13 0400 10/01/13 0320  CKTOTAL 53  --  60 58  --   --   --   --   CKMB  --   --  5.9* 4.9*  --   --   --   --   TROPONINI  --   < > 0.87* 1.42* 0.72* 1.01* 1.26* 0.92*  < > = values in this interval not displayed. BNP (last 3 results)  Recent Labs  05/16/13 1403 05/20/13 0606 06/14/13 1150  PROBNP 2658.0* 1575.0* 879.1*   CBG:  Recent Labs Lab 10/02/13 1636 10/02/13 1738 10/02/13 2328 10/03/13 0826 10/03/13 1218  GLUCAP 162* 213* 126* 129* 189*    Recent Results (from the past 240 hour(s))  CULTURE, BLOOD (  ROUTINE X 2)     Status: None   Collection Time    09/28/13 11:42 AM      Result Value Ref Range Status   Specimen Description BLOOD LEFT ARM  [facility]   Final   Special Requests NONE   Final   Culture  Setup Time     Final   Value: 09/28/2013 21:24     [facility]   Culture     Final   Value: ENTEROCOCCUS GALLINARUM     STAPHYLOCOCCUS SPECIES (COAGULASE NEGATIVE)     Note: THE SIGNIFICANCE OF ISOLATING THIS ORGANISM FROM A SINGLE SET OF BLOOD CULTURES WHEN MULTIPLE SETS ARE DRAWN IS UNCERTAIN. PLEASE NOTIFY THE MICROBIOLOGY DEPARTMENT WITHIN ONE WEEK IF SPECIATION AND SENSITIVITIES ARE REQUIRED.     Note: Gram Stain Report Called to,Read Back By and Verified With: GAIL S@11 :51AM ON 09/29/13 BY DANTS     [facility]   Report Status 10/02/2013 FINAL   Final   Organism ID, Bacteria ENTEROCOCCUS GALLINARUM   Final  URINE CULTURE     Status: None   Collection Time    09/28/13 11:59 AM      Result Value Ref Range Status   Specimen Description URINE, RANDOM   Final   Special Requests Immunocompromised   Final   Culture  Setup Time     Final   Value:  09/28/2013 19:47     [facility]   Colony Count     Final   Value: 15,000 COLONIES/ML     [facility] at [facility]   Culture     Final   Value: Multiple bacterial morphotypes present, none predominant. Suggest appropriate recollection if clinically indicated.     [facility]   Report Status 09/29/2013 FINAL   Final  CLOSTRIDIUM DIFFICILE BY PCR     Status: Abnormal   Collection Time    09/28/13  4:59 PM      Result Value Ref Range Status   C difficile by pcr POSITIVE (*) NEGATIVE Final   Comment: CRITICAL RESULT CALLED TO, READ BACK BY AND VERIFIED WITH:     MAIN RN 9:40 09/29/13 ([email or web address])     [facility]  MRSA PCR SCREENING     Status: Abnormal   Collection Time    09/28/13  4:59 PM      Result Value Ref Range Status   MRSA by PCR POSITIVE (*) NEGATIVE Final   Comment:            The GeneXpert MRSA Assay (FDA     approved for NASAL specimens     only), is one component of a     comprehensive MRSA colonization     surveillance program. It is not     intended to diagnose MRSA     infection nor to guide or     monitor treatment for     MRSA infections.     RESULT CALLED TO, READ BACK BY AND VERIFIED WITH:     S.DILLON RN AT 1859 ON 19APR15 BY C.BONGEL  CULTURE, BLOOD (ROUTINE X 2)     Status: None   Collection Time    09/28/13  5:07 PM      Result Value Ref Range Status   Specimen Description BLOOD RIGHT ARM   Final   Special Requests BOTTLES DRAWN AEROBIC AND ANAEROBIC 9CC   Final  Culture  Setup Time     Final   Value: 09/28/2013 21:24     Performed at [facility]   Culture     Final   Value:        BLOOD CULTURE RECEIVED NO GROWTH TO DATE CULTURE WILL BE HELD FOR 5 DAYS BEFORE ISSUING A FINAL NEGATIVE REPORT     Performed at [facility]   Report Status PENDING   Incomplete     Studies:  Recent x-ray studies have been reviewed in detail by the Attending Physician  Scheduled  Meds:  Scheduled Meds: . ampicillin (OMNIPEN) IV  2 g Intravenous 4 times per day  . aspirin EC  81 mg Oral Daily  . atorvastatin  10 mg Oral QHS  . Chlorhexidine Gluconate Cloth  6 each Topical Q0600  . feeding supplement (GLUCERNA SHAKE)  237 mL Oral TID WC  . finasteride  5 mg Oral QHS  . heparin subcutaneous  5,000 Units Subcutaneous 3 times per day  . insulin aspart  0-15 Units Subcutaneous TID WC  . insulin glargine  6 Units Subcutaneous Daily  . levothyroxine  50 mcg Oral QAC breakfast  . mirtazapine  15 mg Oral QHS  . multivitamin with minerals  1 tablet Oral q morning - 10a  . saccharomyces boulardii  250 mg Oral BID  . sodium chloride  3 mL Intravenous Q12H  . tamsulosin  0.4 mg Oral QPC supper  . vancomycin  500 mg Oral 4 times per day   Continuous Infusions:    Time spent on care of this patient: 35 min   Calvert Cantor, MD 10/03/2013, 12:59 PM  LOS: 5 days   [facility] Office  [phone] Pager - Text Page per Loretha Stapler   If 7PM-7AM, please contact night-coverage [email or web address]

## 2013-10-03 NOTE — Progress Notes (Addendum)
Clinical Social Work Department CLINICAL SOCIAL WORK PLACEMENT NOTE 10/03/2013  Patient:  David Davidson, David Davidson  Account Number:  [id number] Admit date:  09/28/2013  Clinical Social Worker:  Unk Lightning, LCSW  Date/time:  10/03/2013 09:30 AM  Clinical Social Work is seeking post-discharge placement for this patient at the following level of care:   SKILLED NURSING   (*CSW will update this form in Epic as items are completed)   10/03/2013  Patient/family provided with [facility] Department of Clinical Social Work's list of facilities offering this level of care within the geographic area requested by the patient (or if unable, by the patient's family).  10/03/2013  Patient/family informed of their freedom to choose among providers that offer the needed level of care, that participate in Medicare, Medicaid or managed care program needed by the patient, have an available bed and are willing to accept the patient.  10/03/2013  Patient/family informed of [facility]' ownership interest in [facility], as well as of the fact that they are under no obligation to receive care at this facility.  PASARR submitted to [employer] on existing # PASARR number received from [employer] on   FL2 transmitted to all facilities in geographic area requested by pt/family on  10/03/2013 FL2 transmitted to all facilities within larger geographic area on   Patient informed that his/her managed care company has contracts with or will negotiate with  certain facilities, including the following:     Patient/family informed of bed offers received:  10/03/13 Patient chooses bed at [facility] Physician recommends and patient chooses bed at    Patient to be transferred to  on   Patient to be transferred to facility by   The following physician request were entered in Epic:   Additional Comments:

## 2013-10-04 DIAGNOSIS — A0472 Enterocolitis due to Clostridium difficile, not specified as recurrent: Secondary | ICD-10-CM | POA: Diagnosis present

## 2013-10-04 LAB — GLUCOSE, CAPILLARY
GLUCOSE-CAPILLARY: 158 mg/dL — AB (ref 70–99)
Glucose-Capillary: 164 mg/dL — ABNORMAL HIGH (ref 70–99)
Glucose-Capillary: 245 mg/dL — ABNORMAL HIGH (ref 70–99)
Glucose-Capillary: 186 mg/dL — ABNORMAL HIGH (ref 70–99)

## 2013-10-04 LAB — CULTURE, BLOOD (ROUTINE X 2): Culture: NO GROWTH

## 2013-10-04 LAB — BASIC METABOLIC PANEL
BUN: 21 mg/dL (ref 6–23)
CO2: 26 mEq/L (ref 19–32)
Calcium: 8.2 mg/dL — ABNORMAL LOW (ref 8.4–10.5)
Chloride: 111 mEq/L (ref 96–112)
Creatinine, Ser: 0.82 mg/dL (ref 0.50–1.35)
GFR, EST AFRICAN AMERICAN: 81 mL/min — AB (ref >90)
GFR, EST NON AFRICAN AMERICAN: 70 mL/min — AB (ref >90)
GLUCOSE: 236 mg/dL — AB (ref 70–99)
POTASSIUM: 4.4 meq/L (ref 3.7–5.3)
SODIUM: 146 meq/L (ref 137–147)

## 2013-10-04 LAB — CBC
HCT: 36.1 % — ABNORMAL LOW (ref 39.0–52.0)
Hemoglobin: 11.4 g/dL — ABNORMAL LOW (ref 13.0–17.0)
MCH: 31.8 pg (ref 26.0–34.0)
MCHC: 31.6 g/dL (ref 30.0–36.0)
MCV: 100.6 fL — ABNORMAL HIGH (ref 78.0–100.0)
Platelets: 102 10*3/uL — ABNORMAL LOW (ref 150–400)
RBC: 3.59 MIL/uL — ABNORMAL LOW (ref 4.22–5.81)
RDW: 15.7 % — AB (ref 11.5–15.5)
WBC: 7.4 10*3/uL (ref 4.0–10.5)

## 2013-10-04 MED ORDER — GLIPIZIDE ER 10 MG PO TB24
10.0000 mg | ORAL_TABLET | Freq: Every day | ORAL | Status: DC
  Administered 2013-10-04 – 2013-10-07 (×4): 10 mg via ORAL
  Filled 2013-10-04 (×6): qty 1

## 2013-10-04 NOTE — Progress Notes (Signed)
Edematous(moderate) penis secondary to condom catheter. Incident noted at 0200 during bed change. Condom applied by nursing assistant at 2200. Skin intact, no complaints noted. No urethral obstruction noted. Condom will remain off until edema resolves

## 2013-10-04 NOTE — Progress Notes (Addendum)
[facility] Progress Note [place] TEAM 1 - Stepdown/ICU TEAM   David Davidson NWG:[id number] DOB: [birth year] DOA: 09/28/2013 PCP: [name], MD  Brief narrative: David Davidson is a 80 y.o. male presenting on 09/28/2013 past medical history of CAD, diabetes, CK-MB and recent C. difficile-completed oral vancomycin course on 4/16 who had an extended stay in December of 2014 for congestive heart failure and a non-ST elevated MI, medically managed. He presented to the emergency room on 4/21 with generalized weakness, fever and frequent falls. He was found to be hypotensive with a mild leukocytosis and cellulitis of the left lower extremity, yeast in urine and diarrhea which became positive for C. difficile again. His troponins were also mildly elevated. Patient was admitted for septic shock  Patient was placed in the step down unit and started on by mouth vancomycin/IV Flagyl for C. difficile, Diflucan for candiduria. Patient's blood cultures preliminary growing out gram-positive cocci in clusters-presumably strep, most likely source is his cellulitis and he is on vancomycin/Zosyn for this. Despite aggressive fluid boluses on 4/20, patient remained hypotensive and critical care was consulted for central line placement and started on pressor support. Patient tolerated this well and pressors have been weaned off on 4/20 one more week.  Lastly, the patient's troponins trended upward. This was discussed with cardiology. Patient was felt to be having a non-STEMI secondary to demand ischemia brought on by septic shock and secondary hypotension. Cardiology recommendation was for treating underlying issue. Heparin has been started as per pharmacy. Patient overall clinically improved on 4/21 and more stable.   Subjective: Feels weak - still not willing to get out of bed today- have encourage him strongly to do so  Assessment/Plan: Principal Problem:   Severe sepsis with septic shock (a)  Recurrent colitis due to Clostridium difficile - C. difficile cultures positive. Given his severe illness on by mouth vancomycin plus IV Flagyl. ID consutled and recommended to stop IV Flagyl  (b)Cellulitis - left leg- appears to have resolved  (c) bacteremia  - 1/2 sets of positive blood cultures- for coag neg staph and enterococcus gallinarium - started Ampicllin- TTE negative for endocarditis- Per ID, can stop Ampicillin after today - appreciate ID assistance  Hypotension with a h/o HTN - Secondary septic shock - Treated with aggressive fluid boluses and then pressor support-  - BP continues to be low in 90-100 systolic  Non-ST elevated MI- likely secondary to septic shock [name] discussed with cardiology and recommendations were to Cycle troponins, heparin drip - Troponin bumped slightly but now seem to be improving - T max 1.26 - Troponins cont to trend down- will d/c Heparin today (started on 4/20)    Paroxysmal a-fib - Currently in normal sinus rhythm  - does not appear to be on chronic anticoagulation  Mildly severe Ao Stenosis - follow- not a surgical candidate  Chronic systolic CHF with EF of 25/30% per ECHO yesterday - compensated - noted that patient does not take and ACE I at home - as he continues to be hypotensive at this time, will need to start ACE I after BP improves   DM (diabetes mellitus), type 2 Cont Lantus and novolog sliding scale - sugars still elevated- resume Glipizide today  S/P CABG   Hyperlipidemia - Stable   Hypothyroidism - Continue Synthroid   Thrombocytopenia - The patient has a history of low platelets. Initially, pharmacy wanted to stop prophylactic heparin. However given concerns of non-STEMI, full dose heparin started. PWe'll continue to monitor  platelet counts    Code Status: DNR Family Communication: with son and daughter on 4/24 Disposition Plan: to be determined by PT eval- most likely SNF    Consultants: ICU  Procedures: Central line- 4/20  Antibiotics: IV vancomycin 4/19-present  IV Zosyn 4/19-present  By mouth vancomycin 4/20-present  By mouth Keflex times one dose 4/20  IV Flagyl 4/20-present  Oral Diflucan 4/19-present    DVT prophylaxis: S/c Lovenox  Objective: Filed Weights   09/30/13 0422 10/01/13 0352 10/04/13 0541  Weight: 91.7 kg (202 lb 2.6 oz) 93.2 kg (205 lb 7.5 oz) 92.7 kg (204 lb 5.9 oz)   Blood pressure 112/75, pulse 106, temperature 97.8 F (36.6 C), temperature source Oral, resp. rate 18, height 6' (1.829 m), weight 92.7 kg (204 lb 5.9 oz), SpO2 94.00%.  Intake/Output Summary (Last 24 hours) at 10/04/13 1149 Last data filed at 10/04/13 0840  Gross per 24 hour  Intake    800 ml  Output    250 ml  Net    550 ml     Exam: General: No acute respiratory distress Lungs: Clear to auscultation bilaterally without wheezes or crackles Cardiovascular: Regular rate and rhythm without murmur gallop or rub normal S1 and S2 Abdomen: Nontender, nondistended, soft, bowel sounds positive, no rebound, no ascites, no appreciable mass Extremities: No significant cyanosis, clubbing, + edema R > L  Data Reviewed: Basic Metabolic Panel:  Recent Labs Lab 09/29/13 0345 09/30/13 0400 10/01/13 0320 10/02/13 0325 10/03/13 0330 10/04/13 0435  NA 145 141 141 142 143 146  K 3.6* 3.6* 3.0* 3.9 4.3 4.4  CL 112 108 109 111 109 111  CO2 19 19 20 20 25 26   GLUCOSE 173* 293* 153* 181* 121* 236*  BUN 20 28* 32* 32* 24* 21  CREATININE 1.02 1.21 1.18 1.16 0.98 0.82  CALCIUM 7.7* 7.6* 7.4* 7.9* 8.1* 8.2*  MG 1.8  --   --   --   --   --   PHOS 3.4  --   --   --   --   --    Liver Function Tests:  Recent Labs Lab 09/28/13 1142 09/29/13 0345 10/01/13 0320  AST 20 20 19   ALT 10 10 15   ALKPHOS 53 46 52  BILITOT 1.0 0.8 <0.2*  PROT 6.7 6.1 5.6*  ALBUMIN 3.2* 2.6* 2.4*   No results found for this basename: LIPASE, AMYLASE,  in the last 168 hours No  results found for this basename: AMMONIA,  in the last 168 hours CBC:  Recent Labs Lab 09/28/13 1142 09/29/13 0345 09/30/13 1100 10/01/13 0320 10/02/13 0325 10/03/13 0330 10/04/13 0435  WBC 11.3* 9.1 8.0 12.4* 8.4 7.7 7.4  NEUTROABS 8.1* 6.7  --   --   --   --   --   HGB 11.1* 11.0* 10.3* 10.6* 10.7* 11.3* 11.4*  HCT 33.8* 33.5* 31.0* 32.1* 33.5* 34.0* 36.1*  MCV 97.7 98.5 97.8 96.7 98.8 97.4 100.6*  PLT 103* 82* 72* 92* 95* 105* 102*   Cardiac Enzymes:  Recent Labs Lab 09/28/13 1142  09/28/13 1750 09/28/13 2246 09/29/13 1542 09/29/13 2102 09/30/13 0400 10/01/13 0320  CKTOTAL 53  --  60 58  --   --   --   --   CKMB  --   --  5.9* 4.9*  --   --   --   --   TROPONINI  --   < > 0.87* 1.42* 0.72* 1.01* 1.26* 0.92*  < > = values in  this interval not displayed. BNP (last 3 results)  Recent Labs  05/16/13 1403 05/20/13 0606 06/14/13 1150  PROBNP 2658.0* 1575.0* 879.1*   CBG:  Recent Labs Lab 10/03/13 0826 10/03/13 1218 10/03/13 1755 10/03/13 2133 10/04/13 0810  GLUCAP 129* 189* 171* 189* 164*    Recent Results (from the past 240 hour(s))  CULTURE, BLOOD (ROUTINE X 2)     Status: None   Collection Time    09/28/13 11:42 AM      Result Value Ref Range Status   Specimen Description BLOOD LEFT ARM  5 ML IN [facility] BOTTLE   Final   Special Requests NONE   Final   Culture  Setup Time     Final   Value: 09/28/2013 21:24     Performed at [facility]   Culture     Final   Value: ENTEROCOCCUS GALLINARUM     STAPHYLOCOCCUS SPECIES (COAGULASE NEGATIVE)     Note: THE SIGNIFICANCE OF ISOLATING THIS ORGANISM FROM A SINGLE SET OF BLOOD CULTURES WHEN MULTIPLE SETS ARE DRAWN IS UNCERTAIN. PLEASE NOTIFY THE MICROBIOLOGY DEPARTMENT WITHIN ONE WEEK IF SPECIATION AND SENSITIVITIES ARE REQUIRED.     Note: Gram Stain Report Called to,Read Back By and Verified With: GAIL S@11 :51AM ON 09/29/13 BY DANTS     Performed at [facility]   Report Status 10/02/2013 FINAL    Final   Organism ID, Bacteria ENTEROCOCCUS GALLINARUM   Final  URINE CULTURE     Status: None   Collection Time    09/28/13 11:59 AM      Result Value Ref Range Status   Specimen Description URINE, RANDOM   Final   Special Requests Immunocompromised   Final   Culture  Setup Time     Final   Value: 09/28/2013 19:47     Performed at [facility] Count     Final   Value: 15,000 COLONIES/ML     Performed at [facility]   Culture     Final   Value: Multiple bacterial morphotypes present, none predominant. Suggest appropriate recollection if clinically indicated.     Performed at [facility]   Report Status 09/29/2013 FINAL   Final  CLOSTRIDIUM DIFFICILE BY PCR     Status: Abnormal   Collection Time    09/28/13  4:59 PM      Result Value Ref Range Status   C difficile by pcr POSITIVE (*) NEGATIVE Final   Comment: CRITICAL RESULT CALLED TO, READ BACK BY AND VERIFIED WITH:     MAIN RN 9:40 09/29/13 ([email or web address])     Performed at [facility]  MRSA PCR SCREENING     Status: Abnormal   Collection Time    09/28/13  4:59 PM      Result Value Ref Range Status   MRSA by PCR POSITIVE (*) NEGATIVE Final   Comment:            The GeneXpert MRSA Assay (FDA     approved for NASAL specimens     only), is one component of a     comprehensive MRSA colonization     surveillance program. It is not     intended to diagnose MRSA     infection nor to guide or     monitor treatment for     MRSA infections.     RESULT CALLED TO, READ BACK BY AND VERIFIED WITH:     S.DILLON RN AT [phone]  ON 19APR15 BY C.BONGEL  CULTURE, BLOOD (ROUTINE X 2)     Status: None   Collection Time    09/28/13  5:07 PM      Result Value Ref Range Status   Specimen Description BLOOD RIGHT ARM   Final   Special Requests BOTTLES DRAWN AEROBIC AND ANAEROBIC [facility]   Final   Culture  Setup Time     Final   Value: 09/28/2013 21:24     Performed at [facility]   Culture     Final    Value: NO GROWTH 5 DAYS     Performed at [facility]   Report Status 10/04/2013 FINAL   Final     Studies:  Recent x-ray studies have been reviewed in detail by the Attending Physician  Scheduled Meds:  Scheduled Meds: . ampicillin (OMNIPEN) IV  2 g Intravenous 4 times per day  . aspirin EC  81 mg Oral Daily  . atorvastatin  10 mg Oral QHS  . feeding supplement (GLUCERNA SHAKE)  237 mL Oral TID WC  . finasteride  5 mg Oral QHS  . heparin subcutaneous  5,000 Units Subcutaneous 3 times per day  . insulin aspart  0-15 Units Subcutaneous TID WC  . insulin glargine  6 Units Subcutaneous Daily  . levothyroxine  50 mcg Oral QAC breakfast  . mirtazapine  15 mg Oral QHS  . multivitamin with minerals  1 tablet Oral q morning - 10a  . saccharomyces boulardii  250 mg Oral BID  . sodium chloride  3 mL Intravenous Q12H  . tamsulosin  0.4 mg Oral QPC supper  . vancomycin  500 mg Oral 4 times per day   Continuous Infusions:    Time spent on care of this patient: 35 min   Calvert Cantor, MD 10/04/2013, 11:49 AM  LOS: 6 days   [facility] Office  [phone] Pager - Text Page per Loretha Stapler   If 7PM-7AM, please contact night-coverage [email or web address]

## 2013-10-04 NOTE — Progress Notes (Signed)
Occupational Therapy Evaluation Patient Details Name: David Davidson MRN: [id number] DOB: 06-02-1911 Today's Date: 10/04/2013    History of Present Illness 80yo M adm with weakness, frequent falls, recurrent colitis due to c. diff   Clinical Impression   Patient presents to OT with decreased ADL independence and safety. Will benefit from continued OT at SNF. All further OT needs can be met at [facility].    Follow Up Recommendations  SNF;Supervision/Assistance - 24 hour    Equipment Recommendations  Other (comment) (tbd at next venue of care)    Recommendations for Other Services       Precautions / Restrictions Precautions Precautions: Fall      Mobility Bed Mobility Overal bed mobility: Needs Assistance Bed Mobility: Supine to Sit;Sit to Supine     Supine to sit: Min assist Sit to supine: Min assist      Transfers                      Balance Overall balance assessment: History of Falls                                          ADL Overall ADL's : Needs assistance/impaired     Grooming: Wash/dry hands;Wash/dry face;Set up;Sitting (shaving; increased time needed)   Upper Body Bathing: Minimal assitance;Sitting   Lower Body Bathing: Maximal assistance   Upper Body Dressing : Minimal assistance;Sitting   Lower Body Dressing: Total assistance               Functional mobility during ADLs: Minimal assistance (bed mobility only) General ADL Comments: Patient sat EOB for grooming activities. Sitting balance S. Patient with decreased activity tolerance. Needed rest breaks during shaving and eventually had to return to supine due to fatigue.     Vision                     Perception     Praxis      Pertinent Vitals/Pain No c/o pain     Hand Dominance Right   Extremity/Trunk Assessment Upper Extremity Assessment Upper Extremity Assessment: Generalized weakness   Lower Extremity Assessment Lower Extremity  Assessment: Defer to PT evaluation       Communication Communication Communication: No difficulties   Cognition Arousal/Alertness: Awake/alert Behavior During Therapy: WFL for tasks assessed/performed Overall Cognitive Status: No family/caregiver present to determine baseline cognitive functioning                     General Comments       Exercises       Shoulder Instructions      Home Living Family/patient expects to be discharged to:: Skilled nursing facility Living Arrangements: Alone                                      Prior Functioning/Environment Level of Independence: Independent with assistive device(s)        Comments: family takes care of meals, laundry, finances and house cleaning.    OT Diagnosis: Generalized weakness   OT Problem List: Decreased strength;Decreased activity tolerance;Decreased safety awareness;Decreased knowledge of use of DME or AE   OT Treatment/Interventions:      OT Goals(Current goals can be found in the care plan section)    OT  Frequency:     Barriers to D/C:            Co-evaluation              End of Session    Activity Tolerance: Patient limited by fatigue Patient left: in bed;with call bell/phone within reach   Time: 1610-9604 OT Time Calculation (min): 14 min Charges:  OT General Charges $OT Visit: 1 Procedure OT Evaluation $Initial OT Evaluation Tier I: 1 Procedure OT Treatments $Self Care/Home Management : 8-22 mins G-Codes:    Meagan Ancona A Shloime Keilman 19-Oct-2013, 10:15 AM

## 2013-10-04 NOTE — Progress Notes (Signed)
ANTIBIOTIC CONSULT NOTE - FOLLOW UP  Pharmacy Consult for ampicillin Indication: Bacteremia  Allergies  Allergen Reactions  . Colestipol Other (See Comments)    "Blacked Out"   . Morphine And Related Other (See Comments)    Reaction: hallucinations    Patient Measurements: Height: 6' (182.9 cm) Weight: 204 lb 5.9 oz (92.7 kg) IBW/kg (Calculated) : 77.6 Adjusted Body Weight:   Vital Signs: Temp: 97.8 F (36.6 C) (04/25 0541) Temp src: Oral (04/25 0541) BP: 112/75 mmHg (04/25 0541) Pulse Rate: 106 (04/25 0541) Intake/Output from previous day: 04/24 0701 - 04/25 0700 In: 560 [P.O.:360; IV Piggyback:200] Out: -  Intake/Output from this shift:    Labs:  Recent Labs  10/02/13 0325 10/03/13 0330 10/04/13 0435  WBC 8.4 7.7 7.4  HGB 10.7* 11.3* 11.4*  PLT 95* 105* 102*  CREATININE 1.16 0.98 0.82   Estimated Creatinine Clearance: 52.6 ml/min (by C-G formula based on Cr of 0.82). No results found for this basename: VANCOTROUGH, VANCOPEAK, VANCORANDOM, GENTTROUGH, GENTPEAK, GENTRANDOM, TOBRATROUGH, TOBRAPEAK, TOBRARND, AMIKACINPEAK, AMIKACINTROU, AMIKACIN,  in the last 72 hours   Microbiology: Recent Results (from the past 720 hour(s))  CULTURE, BLOOD (ROUTINE X 2)     Status: None   Collection Time    09/28/13 11:42 AM      Result Value Ref Range Status   Specimen Description BLOOD LEFT ARM  5 ML IN [facility] BOTTLE   Final   Special Requests NONE   Final   Culture  Setup Time     Final   Value: 09/28/2013 21:24     Performed at [facility]   Culture     Final   Value: ENTEROCOCCUS GALLINARUM     STAPHYLOCOCCUS SPECIES (COAGULASE NEGATIVE)     Note: THE SIGNIFICANCE OF ISOLATING THIS ORGANISM FROM A SINGLE SET OF BLOOD CULTURES WHEN MULTIPLE SETS ARE DRAWN IS UNCERTAIN. PLEASE NOTIFY THE MICROBIOLOGY DEPARTMENT WITHIN ONE WEEK IF SPECIATION AND SENSITIVITIES ARE REQUIRED.     Note: Gram Stain Report Called to,Read Back By and Verified With: GAIL S@11 :51AM ON  09/29/13 BY DANTS     Performed at [facility]   Report Status 10/02/2013 FINAL   Final   Organism ID, Bacteria ENTEROCOCCUS GALLINARUM   Final  URINE CULTURE     Status: None   Collection Time    09/28/13 11:59 AM      Result Value Ref Range Status   Specimen Description URINE, RANDOM   Final   Special Requests Immunocompromised   Final   Culture  Setup Time     Final   Value: 09/28/2013 19:47     Performed at [facility] Count     Final   Value: 15,000 COLONIES/ML     Performed at [facility]   Culture     Final   Value: Multiple bacterial morphotypes present, none predominant. Suggest appropriate recollection if clinically indicated.     Performed at [facility]   Report Status 09/29/2013 FINAL   Final  CLOSTRIDIUM DIFFICILE BY PCR     Status: Abnormal   Collection Time    09/28/13  4:59 PM      Result Value Ref Range Status   C difficile by pcr POSITIVE (*) NEGATIVE Final   Comment: CRITICAL RESULT CALLED TO, READ BACK BY AND VERIFIED WITH:     MAIN RN 9:40 09/29/13 ([email or web address])     Performed at [facility]  MRSA PCR SCREENING  Status: Abnormal   Collection Time    09/28/13  4:59 PM      Result Value Ref Range Status   MRSA by PCR POSITIVE (*) NEGATIVE Final   Comment:            The GeneXpert MRSA Assay (FDA     approved for NASAL specimens     only), is one component of a     comprehensive MRSA colonization     surveillance program. It is not     intended to diagnose MRSA     infection nor to guide or     monitor treatment for     MRSA infections.     RESULT CALLED TO, READ BACK BY AND VERIFIED WITH:     S.DILLON RN AT 1859 ON 19APR15 BY C.BONGEL  CULTURE, BLOOD (ROUTINE X 2)     Status: None   Collection Time    09/28/13  5:07 PM      Result Value Ref Range Status   Specimen Description BLOOD RIGHT ARM   Final   Special Requests BOTTLES DRAWN AEROBIC AND ANAEROBIC 9CC   Final   Culture  Setup Time     Final    Value: 09/28/2013 21:24     Performed at [facility]   Culture     Final   Value:        BLOOD CULTURE RECEIVED NO GROWTH TO DATE CULTURE WILL BE HELD FOR 5 DAYS BEFORE ISSUING A FINAL NEGATIVE REPORT     Performed at [facility]   Report Status PENDING   Incomplete    Anti-infectives   Start     Dose/Rate Route Frequency Ordered Stop   10/02/13 1330  ampicillin (OMNIPEN) 2 g in sodium chloride 0.9 % 50 mL IVPB     2 g 150 mL/hr over 20 Minutes Intravenous 4 times per day 10/02/13 1307 10/04/13 2359   10/02/13 1230  ampicillin (OMNIPEN) 1 g in sodium chloride 0.9 % 50 mL IVPB  Status:  Discontinued     1 g 150 mL/hr over 20 Minutes Intravenous 4 times per day 10/02/13 1156 10/02/13 1307   09/29/13 1800  vancomycin (VANCOCIN) IVPB 750 mg/150 ml premix  Status:  Discontinued     750 mg 150 mL/hr over 60 Minutes Intravenous Every 12 hours 09/29/13 1349 10/02/13 1719   09/29/13 1415  piperacillin-tazobactam (ZOSYN) IVPB 3.375 g  Status:  Discontinued     3.375 g 12.5 mL/hr over 240 Minutes Intravenous 3 times per day 09/29/13 1349 10/02/13 0739   09/29/13 1200  vancomycin (VANCOCIN) 50 mg/mL oral solution 500 mg     500 mg Oral 4 times per day 09/29/13 1021 10/13/13 1159   09/29/13 1200  metroNIDAZOLE (FLAGYL) IVPB 500 mg  Status:  Discontinued     500 mg 100 mL/hr over 60 Minutes Intravenous 3 times per day 09/29/13 1022 10/02/13 1719   09/29/13 1200  cephALEXin (KEFLEX) capsule 500 mg  Status:  Discontinued     500 mg Oral 4 times per day 09/29/13 1022 09/29/13 1349   09/29/13 1100  fluconazole (DIFLUCAN) tablet 200 mg  Status:  Discontinued     200 mg Oral Daily 09/29/13 1021 10/01/13 1344   09/29/13 1000  fluconazole (DIFLUCAN) IVPB 100 mg  Status:  Discontinued     100 mg 50 mL/hr over 60 Minutes Intravenous Every 24 hours 09/28/13 1644 09/29/13 1021   09/29/13 0600  vancomycin (VANCOCIN) IVPB 750 mg/150  ml premix  Status:  Discontinued     750 mg 150 mL/hr  over 60 Minutes Intravenous Every 12 hours 09/28/13 1700 09/29/13 1021   09/28/13 1800  piperacillin-tazobactam (ZOSYN) IVPB 3.375 g  Status:  Discontinued     3.375 g 12.5 mL/hr over 240 Minutes Intravenous Every 8 hours 09/28/13 1651 09/29/13 1021   09/28/13 1700  vancomycin (VANCOCIN) 1,250 mg in sodium chloride 0.9 % 250 mL IVPB     1,250 mg 166.7 mL/hr over 90 Minutes Intravenous NOW 09/28/13 1659 09/28/13 1906   09/28/13 1645  vancomycin (VANCOCIN) 125 MG capsule 125 mg  Status:  Discontinued     125 mg Oral Daily 09/28/13 1644 09/28/13 1648   09/28/13 1645  piperacillin-tazobactam (ZOSYN) IVPB 2.25 g  Status:  Discontinued     2.25 g 100 mL/hr over 30 Minutes Intravenous 3 times per day 09/28/13 1644 09/28/13 1650   09/28/13 1345  cefTRIAXone (ROCEPHIN) 1 g in dextrose 5 % 50 mL IVPB     1 g 100 mL/hr over 30 Minutes Intravenous  Once 09/28/13 1343 09/28/13 1450   09/28/13 1330  fluconazole (DIFLUCAN) tablet 150 mg     150 mg Oral  Once 09/28/13 1327 09/28/13 1412      Assessment: 89 yoM with history of recurrent C.diff (completed course of tapered oral vancomycin on 4/15) presents with sepsis secondary to UTI and/or left leg cellulitis. MD ordered Vancomycin and Zosyn for planned 7-10 treatment pending septic workup results.  4/19 CTX x 1 4/19 >> Fluconazole >> 4/22 4/19 >> Vanc >> 4/23 4/19 >> Zosyn >> 4/23 4/20 >> cephalexin >> 4/20 4/20 >> vanc PO >  4/20 >> flagyl IV >> 4/23 4/23 >> Ampicillin >>  Tmax: remains afeb WBC: 7.4 Renal: SCr decreasing (after bump w/hypoperfusion). SCr 0.82, CrCl 40 CG  4/19 MRSA PCR: positive 4/19 Blood x2: 1/2 GPC w/ Enterococcus Gallinarum (sens: ampicillin, resistant: vanc) + CNStaph 4/19 Urine: 15k mult bact morphotypes, none predominant. 4/19 Cdiff PCR: positive (recent positive s/p treatment)  Levels / Dose changes: 4/20 de-escalated to cephalexin due to mild non-purulent cellulitis, but BCx w/ GPC pairs which may represent  strep from cellulitis. Pharmacy consulted to restart vanc, zosyn. 4/21 1700 VT = 15.1 mcg/ml  TTE - no apparent vegetations  Goal of Therapy:  Dose for renal function and indication  Plan:  Day #3 Ampicillin   Continue ampicillin 2gm IV q6h  Juliette Alcideustin Zeigler, PharmD, BCPS.   Pager: [phone]  10/04/2013,10:43 AM

## 2013-10-04 NOTE — Evaluation (Signed)
Physical Therapy Evaluation Patient Details Name: David Davidson MRN: [id number] DOB: 1910-07-11 Today's Date: 10/04/2013   History of Present Illness  80yo M adm with weakness, frequent falls, recurrent colitis due to c. diff; PMHx; Myocardial infarct, CHF, HTN  Clinical Impression  Pt will benefit from PT to address deficits below; will follow in  Acute Setting;  Pt with significant DOE, sats 94% on RA and HR 84    Follow Up Recommendations SNF    Equipment Recommendations  None recommended by PT    Recommendations for Other Services       Precautions / Restrictions Precautions Precautions: Fall      Mobility  Bed Mobility Overal bed mobility: Needs Assistance Bed Mobility: Supine to Sit;Sit to Supine;Rolling Rolling: Min assist (x2)   Supine to sit: Min assist Sit to supine: Min assist;Mod assist   General bed mobility comments: cues for hand placement  Transfers Overall transfer level: Needs assistance Equipment used: Rolling walker (2 wheeled) Transfers: Sit to/from Stand Sit to Stand: Min assist;Mod assist;+2 safety/equipment;From elevated surface         General transfer comment: cues for hand placement and control of descent; pt attempts to sit before reaching bed, +2 for safety  Ambulation/Gait   Ambulation Distance (Feet): 20 Feet (in room) Assistive device: Rolling walker (2 wheeled) Gait Pattern/deviations: Step-to pattern;Wide base of support;Trunk flexed;Drifts right/left     General Gait Details: cues for RW distance from self and  posture  Stairs            Wheelchair Mobility    Modified Rankin (Stroke Patients Only)       Balance Overall balance assessment: History of Falls;Needs assistance Sitting-balance support: Feet supported;No upper extremity supported Sitting balance-Leahy Scale: Fair       Standing balance-Leahy Scale: Poor                               Pertinent Vitals/Pain No c.o pain    Home  Living Family/patient expects to be discharged to:: Skilled nursing facility Living Arrangements: Alone               Additional Comments: States son lives next door    Prior Function Level of Independence: Independent with assistive device(s)         Comments: family takes care of meals, laundry, finances and house cleaning.     Hand Dominance        Extremity/Trunk Assessment   Upper Extremity Assessment: Defer to OT evaluation           Lower Extremity Assessment: Generalized weakness         Communication   Communication: No difficulties  Cognition Arousal/Alertness: Awake/alert Behavior During Therapy: WFL for tasks assessed/performed Overall Cognitive Status: No family/caregiver present to determine baseline cognitive functioning                      General Comments      Exercises        Assessment/Plan    PT Assessment Patient needs continued PT services  PT Diagnosis Difficulty walking   PT Problem List Decreased strength;Decreased range of motion;Decreased activity tolerance;Decreased balance;Decreased knowledge of use of DME;Decreased mobility  PT Treatment Interventions DME instruction;Gait training;Functional mobility training;Therapeutic activities;Therapeutic exercise;Patient/family education   PT Goals (Current goals can be found in the Care Plan section) Acute Rehab PT Goals Patient Stated Goal: go to rehab PT Goal  Formulation: With patient Time For Goal Achievement: 10/18/13 Potential to Achieve Goals: Good    Frequency Min 3X/week   Barriers to discharge        Co-evaluation               End of Session Equipment Utilized During Treatment: Gait belt Activity Tolerance: Patient tolerated treatment well;Patient limited by fatigue Patient left: in bed;with call bell/phone within reach Nurse Communication: Mobility status         Time: 1610-9604 PT Time Calculation (min): 26 min   Charges:   PT  Evaluation $Initial PT Evaluation Tier I: 1 Procedure PT Treatments $Gait Training: 8-22 mins $Therapeutic Activity: 8-22 mins   PT G Codes:          Caswell Corwin 10/04/2013, 4:25 PM

## 2013-10-05 LAB — CBC
HEMATOCRIT: 35.1 % — AB (ref 39.0–52.0)
HEMOGLOBIN: 11.1 g/dL — AB (ref 13.0–17.0)
MCH: 31.4 pg (ref 26.0–34.0)
MCHC: 31.6 g/dL (ref 30.0–36.0)
MCV: 99.4 fL (ref 78.0–100.0)
Platelets: 112 10*3/uL — ABNORMAL LOW (ref 150–400)
RBC: 3.53 MIL/uL — ABNORMAL LOW (ref 4.22–5.81)
RDW: 15.6 % — AB (ref 11.5–15.5)
WBC: 8.3 10*3/uL (ref 4.0–10.5)

## 2013-10-05 LAB — GLUCOSE, CAPILLARY
GLUCOSE-CAPILLARY: 121 mg/dL — AB (ref 70–99)
GLUCOSE-CAPILLARY: 235 mg/dL — AB (ref 70–99)
Glucose-Capillary: 219 mg/dL — ABNORMAL HIGH (ref 70–99)
Glucose-Capillary: 183 mg/dL — ABNORMAL HIGH (ref 70–99)
Glucose-Capillary: 157 mg/dL — ABNORMAL HIGH (ref 70–99)

## 2013-10-05 MED ORDER — DIGOXIN 0.25 MG/ML IJ SOLN
0.2500 mg | Freq: Four times a day (QID) | INTRAMUSCULAR | Status: AC
  Administered 2013-10-06 (×2): 0.25 mg via INTRAVENOUS
  Filled 2013-10-05 (×4): qty 1

## 2013-10-05 MED ORDER — DILTIAZEM HCL 100 MG IV SOLR
5.0000 mg/h | INTRAVENOUS | Status: DC
  Administered 2013-10-05: 5 mg/h via INTRAVENOUS
  Filled 2013-10-05: qty 100

## 2013-10-05 MED ORDER — DIGOXIN 0.25 MG/ML IJ SOLN
0.5000 mg | Freq: Once | INTRAMUSCULAR | Status: AC
  Administered 2013-10-05: 0.5 mg via INTRAVENOUS
  Filled 2013-10-05: qty 2

## 2013-10-05 MED ORDER — DILTIAZEM HCL 100 MG IV SOLR
5.0000 mg/h | INTRAVENOUS | Status: DC
  Filled 2013-10-05: qty 100

## 2013-10-05 MED ORDER — DIGOXIN 0.25 MG/ML IJ SOLN: 0.2500 mg | Freq: Four times a day (QID) | INTRAMUSCULAR | Status: DC

## 2013-10-05 NOTE — Progress Notes (Signed)
Report called to Kendal Hymen, RN for transfer to Rm [id number]

## 2013-10-05 NOTE — Progress Notes (Addendum)
[facility] Progress Note [place] TEAM 1 - Stepdown/ICU TEAM   ZEESHAN EYERMAN SEG:[id number] DOB: Oct 22, 1910 DOA: 09/28/2013 PCP: [name], MD  Brief narrative: David Davidson is a 80 y.o. male presenting on 09/28/2013 past medical history of CAD, diabetes, CK-MB and recent C. difficile-completed oral vancomycin course on 4/16 who had an extended stay in December of 2014 for congestive heart failure and a non-ST elevated MI, medically managed. He presented to the emergency room on 4/21 with generalized weakness, fever and frequent falls. He was found to be hypotensive with a mild leukocytosis and cellulitis of the left lower extremity, yeast in urine and diarrhea which became positive for C. difficile again. His troponins were also mildly elevated. Patient was admitted for septic shock  Patient was placed in the step down unit and started on by mouth vancomycin/IV Flagyl for C. difficile, Diflucan for candiduria. Patient's blood cultures preliminary growing out gram-positive cocci in clusters-presumably strep, most likely source is his cellulitis and he is on vancomycin/Zosyn for this. Despite aggressive fluid boluses on 4/20, patient remained hypotensive and critical care was consulted for central line placement and started on pressor support. Patient tolerated this well and pressors have been weaned off on 4/20 one more week.  Lastly, the patient's troponins trended upward. This was discussed with cardiology. Patient was felt to be having a non-STEMI secondary to demand ischemia brought on by septic shock and secondary hypotension. Cardiology recommendation was for treating underlying issue. Heparin has been started as per pharmacy. Patient overall clinically improved on 4/21 and more stable.   Subjective: Feels some better today. Able to walk to the door yesterday but very weak.   Assessment/Plan: Principal Problem:   Severe sepsis with septic shock (a) Recurrent colitis due  to Clostridium difficile - C. difficile cultures positive. Given his severe illness on by mouth vancomycin plus IV Flagyl. ID consutled and recommended to stop IV Flagyl - no further diarrhea- tolerating orals well.  (b)Cellulitis - left leg- appears to have resolved  (c) bacteremia  - 1/2 sets of positive blood cultures- for coag neg staph and enterococcus gallinarium - started Ampicllin- TTE negative for endocarditis- Per ID, Ampicillin waas given but as ECHO was negative, it was stopped on 4/25 - appreciate ID assistance  Hypotension with a h/o HTN - Secondary septic shock - Treated with aggressive fluid boluses and then pressor support-  - BP now improving with systolic > 110  Non-ST elevated MI- likely secondary to septic shock [name] discussed with cardiology and recommendations were to Cycle troponins, heparin drip - Troponin bumped slightly but now seem to be improving - T max 1.26 - Troponins cont to trend down- have d/c'd Heparin     Paroxysmal a-fib - on Exam sounds to be in A- fib- will obtain an EKG - not on rate controlling agent at home  - does not appear to be on chronic anticoagulation- at this time he is a fall risk and therefore, will avoid anticoagulants other than a baby ASA.   Mildly severe Ao Stenosis - follow- not a surgical candidate  Chronic systolic CHF with EF of 25/30% per ECHO yesterday - compensated - noted that patient does not take and ACE I at home - as he continues to be hypotensive at this time, will need to start ACE I after BP improves   DM (diabetes mellitus), type 2 Cont Lantus and novolog sliding scale - sugars still elevated- resume Glipizide today  S/P CABG  Hyperlipidemia - Stable   Hypothyroidism - Continue Synthroid   Thrombocytopenia - The patient has a history of low platelets. Initially, pharmacy wanted to stop prophylactic heparin. However given concerns of non-STEMI, full dose heparin started. PWe'll continue to  monitor platelet counts    Code Status: DNR Family Communication: with son and daughter on 4/24 Disposition Plan:  SNF tomorrow  Consultants: ICU  Procedures: Central line- 4/20  Antibiotics: IV vancomycin 4/19-4/23 IV Zosyn 4/19- 4/23 By mouth vancomycin 4/20-present  By mouth Keflex times one dose 4/20  IV Flagyl 4/20- 4/23 Oral Diflucan 4/19-4/22 Ampicillin 4/23-4/25   DVT prophylaxis: S/c Lovenox  Objective: Filed Weights   10/01/13 0352 10/04/13 0541 10/05/13 0538  Weight: 93.2 kg (205 lb 7.5 oz) 92.7 kg (204 lb 5.9 oz) 93.4 kg (205 lb 14.6 oz)   Blood pressure 119/78, pulse 104, temperature 98.1 F (36.7 C), temperature source Oral, resp. rate 16, height 6' (1.829 m), weight 93.4 kg (205 lb 14.6 oz), SpO2 92.00%.  Intake/Output Summary (Last 24 hours) at 10/05/13 0958 Last data filed at 10/05/13 0850  Gross per 24 hour  Intake    720 ml  Output    851 ml  Net   -131 ml     Exam: General: No acute respiratory distress Lungs: Clear to auscultation bilaterally without wheezes or crackles Cardiovascular: Regular rate and rhythm without murmur gallop or rub normal S1 and S2 Abdomen: Nontender, nondistended, soft, bowel sounds positive, no rebound, no ascites, no appreciable mass Extremities: No significant cyanosis, clubbing, + edema R > L  Data Reviewed: Basic Metabolic Panel:  Recent Labs Lab 09/29/13 0345 09/30/13 0400 10/01/13 0320 10/02/13 0325 10/03/13 0330 10/04/13 0435  NA 145 141 141 142 143 146  K 3.6* 3.6* 3.0* 3.9 4.3 4.4  CL 112 108 109 111 109 111  CO2 19 19 20 20 25 26   GLUCOSE 173* 293* 153* 181* 121* 236*  BUN 20 28* 32* 32* 24* 21  CREATININE 1.02 1.21 1.18 1.16 0.98 0.82  CALCIUM 7.7* 7.6* 7.4* 7.9* 8.1* 8.2*  MG 1.8  --   --   --   --   --   PHOS 3.4  --   --   --   --   --    Liver Function Tests:  Recent Labs Lab 09/28/13 1142 09/29/13 0345 10/01/13 0320  AST 20 20 19   ALT 10 10 15   ALKPHOS 53 46 52  BILITOT 1.0  0.8 <0.2*  PROT 6.7 6.1 5.6*  ALBUMIN 3.2* 2.6* 2.4*   No results found for this basename: LIPASE, AMYLASE,  in the last 168 hours No results found for this basename: AMMONIA,  in the last 168 hours CBC:  Recent Labs Lab 09/28/13 1142 09/29/13 0345  10/01/13 0320 10/02/13 0325 10/03/13 0330 10/04/13 0435 10/05/13 0317  WBC 11.3* 9.1  < > 12.4* 8.4 7.7 7.4 8.3  NEUTROABS 8.1* 6.7  --   --   --   --   --   --   HGB 11.1* 11.0*  < > 10.6* 10.7* 11.3* 11.4* 11.1*  HCT 33.8* 33.5*  < > 32.1* 33.5* 34.0* 36.1* 35.1*  MCV 97.7 98.5  < > 96.7 98.8 97.4 100.6* 99.4  PLT 103* 82*  < > 92* 95* 105* 102* 112*  < > = values in this interval not displayed. Cardiac Enzymes:  Recent Labs Lab 09/28/13 1142  09/28/13 1750 09/28/13 2246 09/29/13 1542 09/29/13 2102 09/30/13 0400 10/01/13 0320  CKTOTAL  53  --  60 58  --   --   --   --   CKMB  --   --  5.9* 4.9*  --   --   --   --   TROPONINI  --   < > 0.87* 1.42* 0.72* 1.01* 1.26* 0.92*  < > = values in this interval not displayed. BNP (last 3 results)  Recent Labs  05/16/13 1403 05/20/13 0606 06/14/13 1150  PROBNP 2658.0* 1575.0* 879.1*   CBG:  Recent Labs Lab 10/04/13 0810 10/04/13 1212 10/04/13 1643 10/04/13 2051 10/05/13 0753  GLUCAP 164* 245* 186* 158* 121*    Recent Results (from the past 240 hour(s))  CULTURE, BLOOD (ROUTINE X 2)     Status: None   Collection Time    09/28/13 11:42 AM      Result Value Ref Range Status   Specimen Description BLOOD LEFT ARM  5 ML IN [facility] BOTTLE   Final   Special Requests NONE   Final   Culture  Setup Time     Final   Value: 09/28/2013 21:24     Performed at [facility]   Culture     Final   Value: ENTEROCOCCUS GALLINARUM     STAPHYLOCOCCUS SPECIES (COAGULASE NEGATIVE)     Note: THE SIGNIFICANCE OF ISOLATING THIS ORGANISM FROM A SINGLE SET OF BLOOD CULTURES WHEN MULTIPLE SETS ARE DRAWN IS UNCERTAIN. PLEASE NOTIFY THE MICROBIOLOGY DEPARTMENT WITHIN ONE WEEK IF  SPECIATION AND SENSITIVITIES ARE REQUIRED.     Note: Gram Stain Report Called to,Read Back By and Verified With: GAIL S@11 :51AM ON 09/29/13 BY DANTS     Performed at [facility]   Report Status 10/02/2013 FINAL   Final   Organism ID, Bacteria ENTEROCOCCUS GALLINARUM   Final  URINE CULTURE     Status: None   Collection Time    09/28/13 11:59 AM      Result Value Ref Range Status   Specimen Description URINE, RANDOM   Final   Special Requests Immunocompromised   Final   Culture  Setup Time     Final   Value: 09/28/2013 19:47     Performed at [facility] Count     Final   Value: 15,000 COLONIES/ML     Performed at [facility]   Culture     Final   Value: Multiple bacterial morphotypes present, none predominant. Suggest appropriate recollection if clinically indicated.     Performed at [facility]   Report Status 09/29/2013 FINAL   Final  CLOSTRIDIUM DIFFICILE BY PCR     Status: Abnormal   Collection Time    09/28/13  4:59 PM      Result Value Ref Range Status   C difficile by pcr POSITIVE (*) NEGATIVE Final   Comment: CRITICAL RESULT CALLED TO, READ BACK BY AND VERIFIED WITH:     MAIN RN 9:40 09/29/13 ([email or web address])     Performed at [place]  MRSA PCR SCREENING     Status: Abnormal   Collection Time    09/28/13  4:59 PM      Result Value Ref Range Status   MRSA by PCR POSITIVE (*) NEGATIVE Final   Comment:            The GeneXpert MRSA Assay (FDA     approved for NASAL specimens     only), is one component of a     comprehensive MRSA colonization  surveillance program. It is not     intended to diagnose MRSA     infection nor to guide or     monitor treatment for     MRSA infections.     RESULT CALLED TO, READ BACK BY AND VERIFIED WITH:     S.DILLON RN AT 1859 ON [id number] BY C.BONGEL  CULTURE, BLOOD (ROUTINE X 2)     Status: None   Collection Time    09/28/13  5:07 PM      Result Value Ref Range Status   Specimen  Description BLOOD RIGHT ARM   Final   Special Requests BOTTLES DRAWN AEROBIC AND ANAEROBIC [facility]   Final   Culture  Setup Time     Final   Value: 09/28/2013 21:24     Performed at [facility]   Culture     Final   Value: NO GROWTH 5 DAYS     Performed at [facility]   Report Status 10/04/2013 FINAL   Final     Studies:  Recent x-ray studies have been reviewed in detail by the Attending Physician  Scheduled Meds:  Scheduled Meds: . aspirin EC  81 mg Oral Daily  . atorvastatin  10 mg Oral QHS  . feeding supplement (GLUCERNA SHAKE)  237 mL Oral TID WC  . finasteride  5 mg Oral QHS  . glipiZIDE  10 mg Oral Q breakfast  . heparin subcutaneous  5,000 Units Subcutaneous 3 times per day  . insulin aspart  0-15 Units Subcutaneous TID WC  . insulin glargine  6 Units Subcutaneous Daily  . levothyroxine  50 mcg Oral QAC breakfast  . mirtazapine  15 mg Oral QHS  . multivitamin with minerals  1 tablet Oral q morning - 10a  . saccharomyces boulardii  250 mg Oral BID  . sodium chloride  3 mL Intravenous Q12H  . tamsulosin  0.4 mg Oral QPC supper  . vancomycin  500 mg Oral 4 times per day   Continuous Infusions:    Time spent on care of this patient: 35 min   Calvert Cantor, MD 10/05/2013, 9:58 AM  LOS: 7 days   [facility] Office  [phone] Pager - Text Page per Loretha Stapler   If 7PM-7AM, please contact night-coverage [email or web address]

## 2013-10-05 NOTE — Progress Notes (Signed)
Pharmacist Heart Failure Core Measure Documentation  Assessment: David Davidson has an EF documented as 25-30%  on 10/03/13 by ECHO  Rationale: Heart failure patients with left ventricular systolic dysfunction (LVSD) and an EF < 40% should be prescribed an angiotensin converting enzyme inhibitor (ACEI) or angiotensin receptor blocker (ARB) at discharge unless a contraindication is documented in the medical record.  This patient is not currently on an ACEI or ARB for HF.  This note is being placed in the record in order to provide documentation that a contraindication to the use of these agents is present for this encounter.  ACE Inhibitor or Angiotensin Receptor Blocker is contraindicated (specify all that apply)  []   ACEI allergy AND ARB allergy []   Angioedema [x]   Moderate or severe aortic stenosis []   Hyperkalemia [x]   Hypotension []   Renal artery stenosis []   Worsening renal function, preexisting renal disease or dysfunction   Dannielle Huh 10/05/2013 10:13 AM

## 2013-10-05 NOTE — Progress Notes (Signed)
Report called to [place], RN for transfer to stepdown bed.

## 2013-10-06 DIAGNOSIS — I4892 Unspecified atrial flutter: Secondary | ICD-10-CM

## 2013-10-06 DIAGNOSIS — Z951 Presence of aortocoronary bypass graft: Secondary | ICD-10-CM

## 2013-10-06 LAB — BASIC METABOLIC PANEL
BUN: 14 mg/dL (ref 6–23)
CHLORIDE: 104 meq/L (ref 96–112)
CO2: 26 mEq/L (ref 19–32)
Calcium: 8.1 mg/dL — ABNORMAL LOW (ref 8.4–10.5)
Creatinine, Ser: 0.85 mg/dL (ref 0.50–1.35)
GFR, EST AFRICAN AMERICAN: 80 mL/min — AB (ref >90)
GFR, EST NON AFRICAN AMERICAN: 69 mL/min — AB (ref >90)
Glucose, Bld: 117 mg/dL — ABNORMAL HIGH (ref 70–99)
Potassium: 4.2 mEq/L (ref 3.7–5.3)
SODIUM: 142 meq/L (ref 137–147)

## 2013-10-06 LAB — CBC
HEMATOCRIT: 35.8 % — AB (ref 39.0–52.0)
Hemoglobin: 11.5 g/dL — ABNORMAL LOW (ref 13.0–17.0)
MCH: 32 pg (ref 26.0–34.0)
MCHC: 32.1 g/dL (ref 30.0–36.0)
MCV: 99.7 fL (ref 78.0–100.0)
Platelets: 108 10*3/uL — ABNORMAL LOW (ref 150–400)
RBC: 3.59 MIL/uL — ABNORMAL LOW (ref 4.22–5.81)
RDW: 15.8 % — ABNORMAL HIGH (ref 11.5–15.5)
WBC: 10.9 10*3/uL — ABNORMAL HIGH (ref 4.0–10.5)

## 2013-10-06 LAB — GLUCOSE, CAPILLARY
GLUCOSE-CAPILLARY: 182 mg/dL — AB (ref 70–99)
Glucose-Capillary: 92 mg/dL (ref 70–99)
Glucose-Capillary: 97 mg/dL (ref 70–99)
Glucose-Capillary: 100 mg/dL — ABNORMAL HIGH (ref 70–99)
Glucose-Capillary: 97 mg/dL (ref 70–99)

## 2013-10-06 MED ORDER — METOPROLOL TARTRATE 25 MG PO TABS
25.0000 mg | ORAL_TABLET | Freq: Two times a day (BID) | ORAL | Status: DC
  Administered 2013-10-06 – 2013-10-07 (×3): 25 mg via ORAL
  Filled 2013-10-06 (×4): qty 1

## 2013-10-06 MED ORDER — ZOLPIDEM TARTRATE 5 MG PO TABS
5.0000 mg | ORAL_TABLET | Freq: Every day | ORAL | Status: DC
  Administered 2013-10-06: 5 mg via ORAL
  Filled 2013-10-06: qty 1

## 2013-10-06 MED ORDER — METOPROLOL TARTRATE 1 MG/ML IV SOLN
2.5000 mg | Freq: Once | INTRAVENOUS | Status: AC
  Administered 2013-10-06: 2.5 mg via INTRAVENOUS
  Filled 2013-10-06: qty 5

## 2013-10-06 MED ORDER — ZOLPIDEM TARTRATE 10 MG PO TABS: 10.0000 mg | ORAL_TABLET | Freq: Every day | ORAL | Status: DC

## 2013-10-06 NOTE — Progress Notes (Signed)
Physical Therapy Treatment Patient Details Name: David Davidson MRN: [id number] DOB: 13-Jan-1911 Today's Date: 10/06/2013    History of Present Illness 80yo M adm with weakness, frequent falls, recurrent colitis due to c. diff; PMHx; Myocardial infarct, CHF, HTN    PT Comments    Pt with DOE, sats > 94% on RA for mobilizing to recliner/ Plans for SNF   Follow Up Recommendations  SNF     Equipment Recommendations  None recommended by PT    Recommendations for Other Services       Precautions / Restrictions Precautions Precautions: Fall Precaution Comments: contact    Mobility  Bed Mobility Overal bed mobility: Needs Assistance Bed Mobility: Supine to Sit     Supine to sit: Mod assist;+2 for safety/equipment;HOB elevated     General bed mobility comments: cues for hand placement, extra time , external support to scoot to the edge  Transfers Overall transfer level: Needs assistance Equipment used: Rolling walker (2 wheeled) Transfers: Sit to/from [employer] Sit to Stand: +2 physical assistance;+2 safety/equipment;Mod assist;From elevated surface Stand pivot transfers: Mod assist;+2 physical assistance;+2 safety/equipment       General transfer comment: pt weaker today, cues for hand placement and safety, use of RW to take small pivotal steps  to recliner.  Ambulation/Gait                 Stairs            Wheelchair Mobility    Modified Rankin (Stroke Patients Only)       Balance                                    Cognition Arousal/Alertness: Awake/alert                          Exercises General Exercises - Lower Extremity Ankle Circles/Pumps: AROM;10 reps;Both Long Arc Quad: AROM;Both;10 reps    General Comments        Pertinent Vitals/Pain     Home Living                      Prior Function            PT Goals (current goals can now be found in the care plan section)  Progress towards PT goals: Progressing toward goals    Frequency  Min 3X/week    PT Plan Current plan remains appropriate    Co-evaluation             End of Session Equipment Utilized During Treatment: Gait belt Activity Tolerance: Patient limited by fatigue Patient left: in chair;with call bell/phone within reach;with chair alarm set;with family/visitor present     Time: 1400-1418 PT Time Calculation (min): 18 min  Charges:  $Therapeutic Activity: 8-22 mins                    G Codes:      Rada Hay 10/06/2013, 2:52 PM Blanchard Kelch PT [phone]

## 2013-10-06 NOTE — Progress Notes (Signed)
[facility] Progress Note [place] TEAM 1 - Stepdown/ICU TEAM   YI HAUGAN ZOX:[id number] DOB: 1910-10-22 DOA: 09/28/2013 PCP: [name], MD  Brief narrative: David Davidson is a 80 y.o. male presenting on 09/28/2013 past medical history of CAD, diabetes, CK-MB and recent C. difficile-completed oral vancomycin course on 4/16 who had an extended stay in December of 2014 for congestive heart failure and a non-ST elevated MI, medically managed. He presented to the emergency room on 4/21 with generalized weakness, fever and frequent falls. He was found to be hypotensive with a mild leukocytosis and cellulitis of the left lower extremity, yeast in urine and diarrhea which became positive for C. difficile again. His troponins were also mildly elevated. Patient was admitted for septic shock  Patient was placed in the step down unit and started on by mouth vancomycin/IV Flagyl for C. difficile, Diflucan for candiduria. Patient's blood cultures preliminary growing out gram-positive cocci in clusters-presumably strep, most likely source is his cellulitis and he is on vancomycin/Zosyn for this. Despite aggressive fluid boluses on 4/20, patient remained hypotensive and critical care was consulted for central line placement and started on pressor support. Patient tolerated this well and pressors have been weaned off on 4/20 one more week.  Lastly, the patient's troponins trended upward. This was discussed with cardiology. Patient was felt to be having a non-STEMI secondary to demand ischemia brought on by septic shock and secondary hypotension. Cardiology recommendation was for treating underlying issue. Heparin has been started as per pharmacy. Patient overall clinically improved on 4/21 and more stable. On 4/26 patient developed A-fib with RVR which he has a history of and has not been on anticoagulation or rate controlling agents for.    Subjective: Has had a BM in the bed- incontinent-  having about 2 BMs daily. No other complaints. I have explained to him in detail abotu his A-fib and the need to control the HR prior to allowing him to go to a SNF for rehab.    Assessment/Plan: Principal Problem:   Severe sepsis with septic shock (a) Recurrent colitis due to Clostridium difficile - C. difficile cultures positive. Given his severe illness was placed on by mouth vancomycin plus IV Flagyl. ID consulted and recommended to stop IV Flagyl - - tolerating orals well.  (b)Cellulitis - left leg- appears to have resolved  (c) bacteremia  - 1/2 sets of positive blood cultures- for coag neg staph and enterococcus gallinarium - started Ampicllin- TTE negative for endocarditis- Per ID, Ampicillin waas given but as ECHO was negative, it was stopped on 4/25 - appreciate ID assistance  Hypotension with a h/o HTN - Secondary septic shock - Treated with aggressive fluid boluses and then pressor support-  - BP now tenuous as we attempt to give rate controlling agents  Non-ST elevated MI- likely secondary to septic shock [name] discussed with cardiology and recommendations were to Cycle troponins, heparin drip - Troponin bumped slightly but now seem to be improving - T max 1.26 - Troponins cont to trend down- have d/c'd Heparin     Paroxysmal a-fib - not on rate controlling agent at home  - does not appear to be on chronic anticoagulation- at this time he is a fall risk and therefore, will avoid anticoagulants other than a baby ASA.  - rate difficult to control due to drops in BP-  Dig loading started last night- will follow rate closely- may need Amio infusion therefore will keep in SDU  Mildly severe Ao  Stenosis - follow- not a surgical candidate  Chronic systolic CHF with EF of 25/30% per ECHO yesterday - compensated - noted that patient does not take and ACE I at home - as he continues to be hypotensive at this time, will need to start ACE I after BP improves   DM  (diabetes mellitus), type 2 Cont Lantus and novolog sliding scale - sugars still elevated- resumed Glipizide  S/P CABG   Hyperlipidemia - Stable   Hypothyroidism - Continue Synthroid   Thrombocytopenia - chronic   Code Status: DNR Family Communication: with son and daughter on 4/24 Disposition Plan:  SNF for rehab  Consultants: ICU  Procedures: Central line- 4/20  Antibiotics: IV vancomycin 4/19-4/23 IV Zosyn 4/19- 4/23 By mouth vancomycin 4/20-present  By mouth Keflex times one dose 4/20  IV Flagyl 4/20- 4/23 Oral Diflucan 4/19-4/22 Ampicillin 4/23-4/25   DVT prophylaxis: S/c Lovenox  Objective: Filed Weights   10/04/13 0541 10/05/13 0538 10/05/13 1400  Weight: 92.7 kg (204 lb 5.9 oz) 93.4 kg (205 lb 14.6 oz) 95.5 kg (210 lb 8.6 oz)   Blood pressure 112/76, pulse 105, temperature 98.3 F (36.8 C), temperature source Oral, resp. rate 27, height 6' (1.829 m), weight 95.5 kg (210 lb 8.6 oz), SpO2 93.00%.  Intake/Output Summary (Last 24 hours) at 10/06/13 0900 Last data filed at 10/06/13 0700  Gross per 24 hour  Intake      3 ml  Output   1251 ml  Net  -1248 ml     Exam: General: No acute respiratory distress Lungs: Clear to auscultation bilaterally without wheezes or crackles Cardiovascular: Irregularly irregular rate and rhythm without murmur gallop or rub normal S1 and S2 Abdomen: Nontender, nondistended, soft, bowel sounds positive, no rebound, no ascites, no appreciable mass Extremities: No significant cyanosis, clubbing, + mild edema Right leg  Data Reviewed: Basic Metabolic Panel:  Recent Labs Lab 10/01/13 0320 10/02/13 0325 10/03/13 0330 10/04/13 0435 10/06/13 0810  NA 141 142 143 146 142  K 3.0* 3.9 4.3 4.4 4.2  CL 109 111 109 111 104  CO2 20 20 25 26 26   GLUCOSE 153* 181* 121* 236* 117*  BUN 32* 32* 24* 21 14  CREATININE 1.18 1.16 0.98 0.82 0.85  CALCIUM 7.4* 7.9* 8.1* 8.2* 8.1*   Liver Function Tests:  Recent Labs Lab  10/01/13 0320  AST 19  ALT 15  ALKPHOS 52  BILITOT <0.2*  PROT 5.6*  ALBUMIN 2.4*   No results found for this basename: LIPASE, AMYLASE,  in the last 168 hours No results found for this basename: AMMONIA,  in the last 168 hours CBC:  Recent Labs Lab 10/02/13 0325 10/03/13 0330 10/04/13 0435 10/05/13 0317 10/06/13 0348  WBC 8.4 7.7 7.4 8.3 10.9*  HGB 10.7* 11.3* 11.4* 11.1* 11.5*  HCT 33.5* 34.0* 36.1* 35.1* 35.8*  MCV 98.8 97.4 100.6* 99.4 99.7  PLT 95* 105* 102* 112* 108*   Cardiac Enzymes:  Recent Labs Lab 09/29/13 1542 09/29/13 2102 09/30/13 0400 10/01/13 0320  TROPONINI 0.72* 1.01* 1.26* 0.92*   BNP (last 3 results)  Recent Labs  05/16/13 1403 05/20/13 0606 06/14/13 1150  PROBNP 2658.0* 1575.0* 879.1*   CBG:  Recent Labs Lab 10/05/13 0753 10/05/13 1141 10/05/13 1411 10/05/13 1643 10/05/13 2136  GLUCAP 121* 235* 219* 183* 157*    Recent Results (from the past 240 hour(s))  CULTURE, BLOOD (ROUTINE X 2)     Status: None   Collection Time    09/28/13 11:42 AM  Result Value Ref Range Status   Specimen Description BLOOD LEFT ARM  5 ML IN [facility] BOTTLE   Final   Special Requests NONE   Final   Culture  Setup Time     Final   Value: 09/28/2013 21:24     Performed at [facility]   Culture     Final   Value: ENTEROCOCCUS GALLINARUM     STAPHYLOCOCCUS SPECIES (COAGULASE NEGATIVE)     Note: THE SIGNIFICANCE OF ISOLATING THIS ORGANISM FROM A SINGLE SET OF BLOOD CULTURES WHEN MULTIPLE SETS ARE DRAWN IS UNCERTAIN. PLEASE NOTIFY THE MICROBIOLOGY DEPARTMENT WITHIN ONE WEEK IF SPECIATION AND SENSITIVITIES ARE REQUIRED.     Note: Gram Stain Report Called to,Read Back By and Verified With: GAIL S@11 :51AM ON 09/29/13 BY DANTS     Performed at [facility]   Report Status 10/02/2013 FINAL   Final   Organism ID, Bacteria ENTEROCOCCUS GALLINARUM   Final  URINE CULTURE     Status: None   Collection Time    09/28/13 11:59 AM      Result Value  Ref Range Status   Specimen Description URINE, RANDOM   Final   Special Requests Immunocompromised   Final   Culture  Setup Time     Final   Value: 09/28/2013 19:47     Performed at [facility] Count     Final   Value: 15,000 COLONIES/ML     Performed at [facility]   Culture     Final   Value: Multiple bacterial morphotypes present, none predominant. Suggest appropriate recollection if clinically indicated.     Performed at [facility]   Report Status 09/29/2013 FINAL   Final  CLOSTRIDIUM DIFFICILE BY PCR     Status: Abnormal   Collection Time    09/28/13  4:59 PM      Result Value Ref Range Status   C difficile by pcr POSITIVE (*) NEGATIVE Final   Comment: CRITICAL RESULT CALLED TO, READ BACK BY AND VERIFIED WITH:     MAIN RN 9:40 09/29/13 ([email or web address])     Performed at [facility]  MRSA PCR SCREENING     Status: Abnormal   Collection Time    09/28/13  4:59 PM      Result Value Ref Range Status   MRSA by PCR POSITIVE (*) NEGATIVE Final   Comment:            The GeneXpert MRSA Assay (FDA     approved for NASAL specimens     only), is one component of a     comprehensive MRSA colonization     surveillance program. It is not     intended to diagnose MRSA     infection nor to guide or     monitor treatment for     MRSA infections.     RESULT CALLED TO, READ BACK BY AND VERIFIED WITH:     S.DILLON RN AT 1859 ON [id number] BY C.BONGEL  CULTURE, BLOOD (ROUTINE X 2)     Status: None   Collection Time    09/28/13  5:07 PM      Result Value Ref Range Status   Specimen Description BLOOD RIGHT ARM   Final   Special Requests BOTTLES DRAWN AEROBIC AND ANAEROBIC [facility]   Final   Culture  Setup Time     Final   Value: 09/28/2013 21:24     Performed at [facility]   Culture     Final   Value: NO GROWTH 5 DAYS     Performed at [facility]   Report Status 10/04/2013 FINAL   Final     Studies:  Recent x-ray studies have been  reviewed in detail by the Attending Physician  Scheduled Meds:  Scheduled Meds: . aspirin EC  81 mg Oral Daily  . atorvastatin  10 mg Oral QHS  . digoxin  0.25 mg Intravenous Q6H  . feeding supplement (GLUCERNA SHAKE)  237 mL Oral TID WC  . finasteride  5 mg Oral QHS  . glipiZIDE  10 mg Oral Q breakfast  . heparin subcutaneous  5,000 Units Subcutaneous 3 times per day  . insulin aspart  0-15 Units Subcutaneous TID WC  . insulin glargine  6 Units Subcutaneous Daily  . levothyroxine  50 mcg Oral QAC breakfast  . mirtazapine  15 mg Oral QHS  . multivitamin with minerals  1 tablet Oral q morning - 10a  . saccharomyces boulardii  250 mg Oral BID  . sodium chloride  3 mL Intravenous Q12H  . tamsulosin  0.4 mg Oral QPC supper  . vancomycin  500 mg Oral 4 times per day  . zolpidem  5 mg Oral QHS   Continuous Infusions: . diltiazem (CARDIZEM) infusion Stopped (10/05/13 1945)    Time spent on care of this patient: 35 min   Calvert Cantor, MD 10/06/2013, 9:00 AM  LOS: 8 days   [facility] Office  [phone] Pager - Text Page per Loretha Stapler   If 7PM-7AM, please contact night-coverage [email or web address]

## 2013-10-06 NOTE — Consult Note (Addendum)
Admit date: 09/28/2013 Referring Physician  Dr. Butler Denmarkizwan Primary Physician Thayer HeadingsMACKENZIE,BRIAN, MD Primary Cardiologist  none currently Reason for Consultation  AFIB  HPI: 80 year old male with with CAD, diabetes and with development of atrial flutter with rapid ventricular response. Heart rate currently improved in the 70s to 80s.  C. difficile recent treatment, December of 2014 diastolic heart failure with non-ST elevation myocardial infarction medically managed him. He is had elevated troponins in the past.  He is not having any chest pain. No shortness of breath.  In review of prior notes, his condition has been discussed with cardiology and recommendation was to treat underlying issues.  PMH:   Past Medical History  Diagnosis Date  . Myocardial infarct   . Diabetes mellitus   . Hypertension   . CHF (congestive heart failure)   . Coronary artery disease   . Clostridium difficile diarrhea     recurrent   . Hyperlipidemia   . Pneumonia 04/2011  . Shortness of breath     only with my heart failure "  . Chronic kidney disease   . Dysrhythmia   . Paroxysmal atrial fibrillation   . DNR (do not resuscitate)     PSH:   Past Surgical History  Procedure Laterality Date  . Coronary artery bypass graft    . Cholecystectomy    . Cardiac catheterization  10/10/2001  . Cystoscopy, turbt  05/09/2006, 02/19/2006, 04/19/2005, 04/21/2003  . Colonoscopy  01/31/2012    Procedure: COLONOSCOPY;  Surgeon: Iva Booparl E Gessner, MD;  Location: [facility] ENDOSCOPY;  Service: Endoscopy;  Laterality: N/A;  fecal transplant/cynthia snyder will prepare the sample   Allergies:  Colestipol and Morphine and related Prior to Admit Meds:   Prior to Admission medications   Medication Sig Start Date End Date Taking? Authorizing Provider  atorvastatin (LIPITOR) 10 MG tablet Take 10 mg by mouth at bedtime.    Yes Historical Provider, MD  finasteride (PROSCAR) 5 MG tablet Take 5 mg by mouth at bedtime.    Yes Historical  Provider, MD  glipiZIDE (GLUCOTROL XL) 10 MG 24 hr tablet Take 10 mg by mouth daily with breakfast.   Yes Historical Provider, MD  GLUCERNA (GLUCERNA) LIQD Take 237 mLs by mouth daily.   Yes Historical Provider, MD  HYDROcodone-acetaminophen (NORCO/VICODIN) 5-325 MG per tablet Take 1 tablet by mouth every 6 (six) hours as needed for moderate pain.   Yes Historical Provider, MD  levothyroxine (SYNTHROID, LEVOTHROID) 50 MCG tablet Take 50 mcg by mouth daily before breakfast.   Yes Historical Provider, MD  mirtazapine (REMERON) 15 MG tablet Take 15 mg by mouth at bedtime.   Yes Historical Provider, MD  Multiple Vitamin (MULTIVITAMIN WITH MINERALS) TABS Take 1 tablet by mouth every morning.    Yes Historical Provider, MD  potassium chloride (K-DUR,KLOR-CON) 10 MEQ tablet Take 10 mEq by mouth every morning.    Yes Historical Provider, MD  tamsulosin (FLOMAX) 0.4 MG CAPS capsule Take 0.4 mg by mouth daily after supper.    Yes Historical Provider, MD  vancomycin (VANCOCIN) 125 MG capsule Take 125 mg by mouth daily. For 7 days started on 09-18-13; stop on 09-24-13   Yes Historical Provider, MD  zolpidem (AMBIEN) 10 MG tablet Take 10 mg by mouth at bedtime.  08/11/13  Yes Historical Provider, MD  vancomycin (VANCOCIN) 50 mg/mL oral solution Take 2.5 mLs (125 mg total) by mouth 3 (three) times daily. Reduce to bid, then qd then off weekly 08/28/13   Iva Booparl E Gessner,  MD   Current hospital medications . aspirin EC  81 mg Oral Daily  . atorvastatin  10 mg Oral QHS  . feeding supplement (GLUCERNA SHAKE)  237 mL Oral TID WC  . finasteride  5 mg Oral QHS  . glipiZIDE  10 mg Oral Q breakfast  . heparin subcutaneous  5,000 Units Subcutaneous 3 times per day  . insulin aspart  0-15 Units Subcutaneous TID WC  . insulin glargine  6 Units Subcutaneous Daily  . levothyroxine  50 mcg Oral QAC breakfast  . metoprolol tartrate  25 mg Oral BID  . mirtazapine  15 mg Oral QHS  . multivitamin with minerals  1 tablet Oral q  morning - 10a  . saccharomyces boulardii  250 mg Oral BID  . sodium chloride  3 mL Intravenous Q12H  . tamsulosin  0.4 mg Oral QPC supper  . vancomycin  500 mg Oral 4 times per day  . zolpidem  5 mg Oral QHS   Fam HX:    Family History  Problem Relation Age of Onset  . Stomach cancer Mother   . Coronary artery disease Father   . Diabetes Son   . Colon cancer Neg Hx    Social HX:    History   Social History  . Marital Status: Widowed    Spouse Name: N/A    Number of Children: 2  . Years of Education: N/A   Occupational History  . Retired    Social History Main Topics  . Smoking status: Former Smoker -- 1.00 packs/day    Types: Cigarettes    Quit date: 05/08/1974  . Smokeless tobacco: Never Used  . Alcohol Use: No     Comment: occasionally  . Drug Use: No  . Sexual Activity: No   Other Topics Concern  . Not on file   Social History Narrative  . No narrative on file     ROS:  Positive for generalized fatigue, weakness. Denies chest pain, denies any bleeding, denies strokelike symptoms. All 11 ROS were addressed and are negative except what is stated in the HPI   Physical Exam: Blood pressure 112/76, pulse 105, temperature 98.1 F (36.7 C), temperature source Oral, resp. rate 27, height 6' (1.829 m), weight 210 lb 8.6 oz (95.5 kg), SpO2 93.00%.   General: Elderly in no acute distress Head: Eyes PERRLA, No xanthomas.   Normal cephalic and atramatic  Lungs:   Clear bilaterally to auscultation and percussion. Normal respiratory effort. No wheezes, no rales. Heart:   Irregularly irregular, normal rate Pulses are 2+ & equal. 2/6 systolic murmur right upper sternal border, no rubs, gallops.  No carotid bruit. No JVD.  No abdominal bruits.  Abdomen: Bowel sounds are positive, abdomen soft and non-tender without masses. No hepatosplenomegaly. Msk:  Back normal. Normal strength and tone for age. Extremities:  No clubbing, cyanosis or edema.  DP +1 Neuro: Alert and  oriented X 3, non-focal, MAE x 4 GU: Deferred Rectal: Deferred Psych:  Good affect, responds appropriately      Labs: Lab Results  Component Value Date   WBC 10.9* 10/06/2013   HGB 11.5* 10/06/2013   HCT 35.8* 10/06/2013   MCV 99.7 10/06/2013   PLT 108* 10/06/2013     Recent Labs Lab 10/01/13 0320  10/06/13 0810  NA 141  < > 142  K 3.0*  < > 4.2  CL 109  < > 104  CO2 20  < > 26  BUN 32*  < >  14  CREATININE 1.18  < > 0.85  CALCIUM 7.4*  < > 8.1*  PROT 5.6*  --   --   BILITOT <0.2*  --   --   ALKPHOS 52  --   --   ALT 15  --   --   AST 19  --   --   GLUCOSE 153*  < > 117*  < > = values in this interval not displayed. No results found for this basename: CKTOTAL, CKMB, TROPONINI,  in the last 72 hours Lab Results  Component Value Date   CHOL 122 05/29/2011   HDL 18* 05/29/2011   LDLCALC 77 05/29/2011   TRIG 134 05/29/2011   No results found for this basename: DDIMER     Radiology:  No results found. Personally viewed.  EKG:   Atrial flutter, right bundle branch block, heart rate 110, variable conduction Personally viewed.   ECHO: 10/03/13: - Left ventricle: The cavity size was moderately dilated. Wall thickness was increased in a pattern of mild LVH. Systolic function was severely reduced. The estimated ejection fraction was in the range of 25% to 30%. Severe hypokinesis. - Aortic valve: Valve mobility was moderately restricted. - Mitral valve: Mild regurgitation. - Left atrium: The atrium was moderately to severely dilated. - Right ventricle: The cavity size was mildly dilated. Systolic function was mildly to moderately reduced. - Right atrium: The atrium was moderately to severely dilated. - Tricuspid valve: Moderate regurgitation.  Troponin 1.26 at peak on 4/21, decreased to 0.92  ASSESSMENT/PLAN:    80 year old with atrial flutter, rapid ventricular response, right bundle branch block, elevated troponin, multiple medical issues.  1. Atrial  flutter-digoxin IV is currently being administered every 6 hours. Given his advanced age, my preference would be to avoid utilization of digoxin. His ejection fraction is 25-30%. I would rather utilize low-dose beta blocker as tolerated. I will start metoprolol 25 mg twice a day. Also, I will discontinue diltiazem in the setting of cardiomyopathy. Given his advanced age, would avoid anticoagulation for fear of bleeding side effect.  2. Elevated troponin-I. agree with prior assessment that this is likely secondary to demand ischemia with underlying medical condition, rapid heart rate.  3. Cardiomyopathy-EF 25%. Trying to add back beta blocker. Optimally, consider ACE inhibitor if able to tolerate in future. For now, avoiding ACE inhibitor because of relative hypotension.  We will continue to follow. Ultimately, goals of care must be continually addressed with him. I would not be aggressive in regards to his cardiovascular management strategy. That is, no invasive procedures. With his underlying cardiomyopathy, continue to try beta blocker for rate control of atrial fibrillation and flutter.  Donato Schultz, MD  10/06/2013  9:27 AM

## 2013-10-06 NOTE — Progress Notes (Signed)
NUTRITION FOLLOW UP  Intervention:   - Continue Glucerna shakes TID - Encouraged continued excellent PO intake (ate 100% of breakfast) - Will continue to monitor   Nutrition Dx:   Altered GI function related to C. Dfificile as evidenced by PCR - ongoing    Goal:   1. Stools < 2 per day - not met  2. Pt to consume >90% of meals/supplements - not met consistently   Monitor:   Weights, labs, intake, BMs  Assessment:   Pt with hx of CAD s/p MI and CABG, chronic systolic heart failure with EF of 35%, severe aortic stenosis, paroxysmal atrial flutter, HTN, HLD, DM, CKD stage 3, recurrent C. Diff diarrhea(came off oral vancomycin 3 days ago), who presents with generalized weakness, falls at least twice in the last 24 hours, fever, dysuria and poor appetite. Found to have sepsis, urinary tract infection, left leg cellulitis, hypotension, elevated troponin, and recurrent colitis due to C. Difficile.   4/20 - Met with pt and family who report pt did not eat anything for 2 days PTA due to not feeling well and having loose stools, which is ongoing. Pt reports before then he was eating better, 3 meals/day - cereal/fruit for breakfast, salad for lunch, and well balanced dinner. Drank 1 Glucerna shake/day at home. Reports 25 pound unintended weight loss in the past 6 months due to having C. Difficile and sometimes not eating 3 meals/day. Pt getting Florastor.   4/27 - Pt reports having a terrible morning, 4 BMs so far today, having incontinent BM during visit, notified RN. States he had 2 per day over the weekend. Reports eating 50% of meals and drinking 3 Glucerna shakes/day. Ate 100% of breakfast this morning.   Continues on Florastor   Height: Ht Readings from Last 1 Encounters:  09/28/13 6' (1.829 m)    Weight Status:   Wt Readings from Last 1 Encounters:  10/05/13 210 lb 8.6 oz (95.5 kg)  Admit wt:        187 lb 6.3 oz (85 kg)  Net I/Os: +6.7L  Re-estimated needs:  Kcal: 2000-2200   Protein: 105-115g  Fluid: 2-2.2L/day   Skin: +1 RLE, LLE edema   Diet Order: General   Intake/Output Summary (Last 24 hours) at 10/06/13 1131 Last data filed at 10/06/13 1000  Gross per 24 hour  Intake      3 ml  Output   1550 ml  Net  -1547 ml    Last BM: 4/27   Labs:   Recent Labs Lab 10/03/13 0330 10/04/13 0435 10/06/13 0810  NA 143 146 142  K 4.3 4.4 4.2  CL 109 111 104  CO2 $Re'25 26 26  'GIm$ BUN 24* 21 14  CREATININE 0.98 0.82 0.85  CALCIUM 8.1* 8.2* 8.1*  GLUCOSE 121* 236* 117*    CBG (last 3)   Recent Labs  10/05/13 1643 10/05/13 2136 10/06/13 0744  GLUCAP 183* 157* 92    Scheduled Meds: . aspirin EC  81 mg Oral Daily  . atorvastatin  10 mg Oral QHS  . feeding supplement (GLUCERNA SHAKE)  237 mL Oral TID WC  . finasteride  5 mg Oral QHS  . glipiZIDE  10 mg Oral Q breakfast  . heparin subcutaneous  5,000 Units Subcutaneous 3 times per day  . insulin aspart  0-15 Units Subcutaneous TID WC  . insulin glargine  6 Units Subcutaneous Daily  . levothyroxine  50 mcg Oral QAC breakfast  . metoprolol tartrate  25 mg  Oral BID  . mirtazapine  15 mg Oral QHS  . multivitamin with minerals  1 tablet Oral q morning - 10a  . saccharomyces boulardii  250 mg Oral BID  . sodium chloride  3 mL Intravenous Q12H  . tamsulosin  0.4 mg Oral QPC supper  . vancomycin  500 mg Oral 4 times per day  . zolpidem  5 mg Oral QHS    Mikey College MS, RD, LDN [phone] Pager [phone] After Hours Pager

## 2013-10-06 NOTE — Progress Notes (Signed)
Patient ID: David Davidson, male   DOB: 20-Oct-1910, 80 y.o.   MRN: [id number]         [facility]    Date of Admission:  09/28/2013           Day 9 oral vancomycin  Principal Problem:   Recurrent colitis due to Clostridium difficile Active Problems:   Severe sepsis with septic shock   Positive blood culture   Cellulitis   CAD (coronary artery disease)   Paroxysmal a-fib   DM (diabetes mellitus), type 2, uncontrolled with complications   Hypertension   S/P CABG (coronary artery bypass graft)   Hyperlipidemia   Hypothyroidism   Thrombocytopenia, chronic   Benign prostatic hypertrophy with urinary frequency   Hypotension   Troponin level elevated   UTI (lower urinary tract infection)   Chronic systolic CHF (congestive heart failure)   Severe aortic stenosis   Enteritis due to Clostridium difficile   C. difficile colitis   . aspirin EC  81 mg Oral Daily  . atorvastatin  10 mg Oral QHS  . feeding supplement (GLUCERNA SHAKE)  237 mL Oral TID WC  . finasteride  5 mg Oral QHS  . glipiZIDE  10 mg Oral Q breakfast  . heparin subcutaneous  5,000 Units Subcutaneous 3 times per day  . insulin aspart  0-15 Units Subcutaneous TID WC  . insulin glargine  6 Units Subcutaneous Daily  . levothyroxine  50 mcg Oral QAC breakfast  . metoprolol tartrate  25 mg Oral BID  . mirtazapine  15 mg Oral QHS  . multivitamin with minerals  1 tablet Oral q morning - 10a  . saccharomyces boulardii  250 mg Oral BID  . sodium chloride  3 mL Intravenous Q12H  . tamsulosin  0.4 mg Oral QPC supper  . vancomycin  500 mg Oral 4 times per day  . zolpidem  5 mg Oral QHS    Subjective: His diarrhea has been a little more frequent in the past 48 hours. Is not having abdominal cramps, nausea or vomiting. His appetite remains poor.  Objective: Temp:  [97.7 F (36.5 C)-98.8 F (37.1 C)] 97.7 F (36.5 C) (04/27 1531) Pulse Rate:  [52-110] 70 (04/27 1531) Resp:  [22-35] 22  (04/27 1531) BP: (87-132)/(49-86) 115/49 mmHg (04/27 1531) SpO2:  [93 %-100 %] 93 % (04/27 1531) Weight:  [205 lb 14.6 oz (93.4 kg)] 205 lb 14.6 oz (93.4 kg) (04/27 1244)  General: He is alert and in no distress visiting with his daughter Skin: No acute rash Lungs: Clear Cor: Regular S1-S2 no murmur Abdomen: Soft and nontender  Lab Results Lab Results  Component Value Date   WBC 10.9* 10/06/2013   HGB 11.5* 10/06/2013   HCT 35.8* 10/06/2013   MCV 99.7 10/06/2013   PLT 108* 10/06/2013    Lab Results  Component Value Date   CREATININE 0.85 10/06/2013   BUN 14 10/06/2013   NA 142 10/06/2013   K 4.2 10/06/2013   CL 104 10/06/2013   CO2 26 10/06/2013    Assessment: He has relapsing C. difficile colitis. He recently completed 7 days of therapy for transient enterococcal bacteremia. He has no evidence of recurrent bacteremia. That antibiotic therapy is likely to make it more difficult to bring his colitis under control.  Plan: 1. Continue oral vancomycin  Cliffton Asters, MD [facility] [phone] pager   [phone] cell 10/06/2013, 3:49 PM

## 2013-10-06 NOTE — Progress Notes (Signed)
CARE MANAGEMENT NOTE 10/06/2013  Patient:  David Davidson, David Davidson   Account Number:  [id number]  Date Initiated:  09/29/2013  Documentation initiated by:  DAVIS,RHONDA  Subjective/Objective Assessment:   80 year old with c.diff and hypotension, was given iv bolus in ed, currently being treated for c.diff sepsis     Action/Plan:   tbd may need snf placement/lives alone   Anticipated DC Date:  10/09/2013   Anticipated DC Plan:  SKILLED NURSING FACILITY  In-house referral  Clinical Social Worker  NA      DC Planning Services  NA      [facility] Choice  NA   Choice offered to / List presented to:  NA   DME arranged  NA      DME agency  NA     HH arranged  NA      HH agency  NA   Status of service:  In process, will continue to follow Medicare Important Message given?  NA - LOS <3 / Initial given by admissions (If response is "NO", the following Medicare IM given date [place] will be blank) Date Medicare IM given:   Date Additional Medicare IM given:    Discharge Disposition:    Per UR Regulation:  Reviewed for med. necessity/level of care/duration of stay  If discussed at Long Length of Stay Meetings, dates discussed:    Comments:  [id number] Earlene Plater, RN, BSN, CCM, [phone] Chart reviewed for update of needs and condition./ patient transferred back to [facility] pm of [id number] due to a.fib and iv cardizem, nsr at the present time, weak, hypotensive.  [id number]/OIBBCW Earlene Plater, RN, BSN, CCM, [phone] Chart reviewed for update of needs and condition./ pt remains on iv levophed drip for hypotensive state, iv heparin, had hypoxic event during night.  [id number]/LKJZPH Earlene Plater, RN, BSN, [place] [phone] Chart Reviewed for discharge and hospital needs. Discharge needs at time of review: None present will follow for needs. Review of patient progress due on [id number].

## 2013-10-06 NOTE — Progress Notes (Signed)
CSW received report from [facility] CSW that pt transferred to [facility] and plan is for [facility] upon discharge.  CSW spoke with RN who reports that pt not medically ready today, but potential for discharge tomorrow.  CSW contacted [facility] and updated facility. [facility] requested updated clinicals. CSW faxed updated clinicals to [facility].  CSW met with pt at bedside. CSW provided support and updated pt that CSW updating [facility] regarding anticipated disposition date. Pt expressed appreciation for visit and updating [facility]. Pt requested CSW contact pt son to update.  CSW contacted pt son, Sarp via telephone. CSW updated pt son.  CSW to continue to follow and assist with pt discharge to [facility] when pt medically ready for discharge.  Alison Murray, MSW, [place] [facility] [phone]

## 2013-10-07 DIAGNOSIS — I359 Nonrheumatic aortic valve disorder, unspecified: Secondary | ICD-10-CM

## 2013-10-07 DIAGNOSIS — I959 Hypotension, unspecified: Secondary | ICD-10-CM

## 2013-10-07 DIAGNOSIS — A0472 Enterocolitis due to Clostridium difficile, not specified as recurrent: Secondary | ICD-10-CM

## 2013-10-07 DIAGNOSIS — I4891 Unspecified atrial fibrillation: Secondary | ICD-10-CM

## 2013-10-07 DIAGNOSIS — I2510 Atherosclerotic heart disease of native coronary artery without angina pectoris: Secondary | ICD-10-CM

## 2013-10-07 LAB — BASIC METABOLIC PANEL
BUN: 11 mg/dL (ref 6–23)
CO2: 29 mEq/L (ref 19–32)
CREATININE: 0.75 mg/dL (ref 0.50–1.35)
Calcium: 8.5 mg/dL (ref 8.4–10.5)
Chloride: 102 mEq/L (ref 96–112)
GFR, EST AFRICAN AMERICAN: 84 mL/min — AB (ref >90)
GFR, EST NON AFRICAN AMERICAN: 73 mL/min — AB (ref >90)
Glucose, Bld: 123 mg/dL — ABNORMAL HIGH (ref 70–99)
Potassium: 4 mEq/L (ref 3.7–5.3)
Sodium: 140 mEq/L (ref 137–147)

## 2013-10-07 LAB — CBC
HEMATOCRIT: 35.1 % — AB (ref 39.0–52.0)
Hemoglobin: 11.4 g/dL — ABNORMAL LOW (ref 13.0–17.0)
MCH: 32.1 pg (ref 26.0–34.0)
MCHC: 32.5 g/dL (ref 30.0–36.0)
MCV: 98.9 fL (ref 78.0–100.0)
PLATELETS: 101 10*3/uL — AB (ref 150–400)
RBC: 3.55 MIL/uL — ABNORMAL LOW (ref 4.22–5.81)
RDW: 15.7 % — AB (ref 11.5–15.5)
WBC: 11 10*3/uL — ABNORMAL HIGH (ref 4.0–10.5)

## 2013-10-07 LAB — GLUCOSE, CAPILLARY
Glucose-Capillary: 125 mg/dL — ABNORMAL HIGH (ref 70–99)
Glucose-Capillary: 153 mg/dL — ABNORMAL HIGH (ref 70–99)

## 2013-10-07 MED ORDER — VANCOMYCIN 50 MG/ML ORAL SOLUTION: 500.0000 mg | Freq: Four times a day (QID) | ORAL | Status: AC

## 2013-10-07 MED ORDER — ASPIRIN 81 MG PO TBEC: 81.0000 mg | DELAYED_RELEASE_TABLET | Freq: Every day | ORAL | Status: AC

## 2013-10-07 MED ORDER — METOPROLOL TARTRATE 25 MG PO TABS: 25.0000 mg | ORAL_TABLET | Freq: Two times a day (BID) | ORAL | Status: AC

## 2013-10-07 MED ORDER — SACCHAROMYCES BOULARDII 250 MG PO CAPS: 250.0000 mg | ORAL_CAPSULE | Freq: Two times a day (BID) | ORAL | Status: AC

## 2013-10-07 MED ORDER — SENNOSIDES-DOCUSATE SODIUM 8.6-50 MG PO TABS: 1.0000 | ORAL_TABLET | Freq: Every evening | ORAL | Status: AC | PRN

## 2013-10-07 NOTE — Progress Notes (Signed)
DAILY PROGRESS NOTE  Subjective:  Appears to be in a more regular rhythm today - appears to be sinus tach. Difficult to ascertain on telemetry. No complaints of chest pain currently.   Objective:  Temp:  [97.7 F (36.5 C)-98.4 F (36.9 C)] 97.7 F (36.5 C) (04/28 0404) Pulse Rate:  [61-105] 65 (04/28 0404) Resp:  [19-30] 19 (04/28 0404) BP: (112-132)/(49-76) 126/67 mmHg (04/28 0404) SpO2:  [93 %-100 %] 98 % (04/28 0404) Weight:  [205 lb 14.6 oz (93.4 kg)-206 lb 2.1 oz (93.5 kg)] 206 lb 2.1 oz (93.5 kg) (04/28 0404) Weight change: -4 lb 10.1 oz (-2.1 kg)  Intake/Output from previous day: 04/27 0701 - 04/28 0700 In: 240 [P.O.:240] Out: 1600 [Urine:1600]  Intake/Output from this shift:    Medications: Current Facility-Administered Medications  Medication Dose Route Frequency Provider Last Rate Last Dose  . 0.9 %  sodium chloride infusion  750 mL Intravenous PRN Erick Colace, NP      . acetaminophen (TYLENOL) tablet 325 mg  325 mg Oral Q3H PRN Annita Brod, MD   325 mg at 10/05/13 1715   Or  . acetaminophen (TYLENOL) suppository 650 mg  650 mg Rectal Q6H PRN Annita Brod, MD      . alum & mag hydroxide-simeth (MAALOX/MYLANTA) 200-200-20 MG/5ML suspension 30 mL  30 mL Oral Q6H PRN Simbiso Ranga, MD      . aspirin EC tablet 81 mg  81 mg Oral Daily Simbiso Ranga, MD   81 mg at 10/06/13 0949  . atorvastatin (LIPITOR) tablet 10 mg  10 mg Oral QHS Simbiso Ranga, MD   10 mg at 10/06/13 2101  . feeding supplement (GLUCERNA SHAKE) (GLUCERNA SHAKE) liquid 237 mL  237 mL Oral TID WC Christie Beckers, RD   237 mL at 10/06/13 0949  . finasteride (PROSCAR) tablet 5 mg  5 mg Oral QHS Simbiso Ranga, MD   5 mg at 10/06/13 2101  . glipiZIDE (GLUCOTROL XL) 24 hr tablet 10 mg  10 mg Oral Q breakfast Debbe Odea, MD   10 mg at 10/06/13 0807  . heparin injection 5,000 Units  5,000 Units Subcutaneous 3 times per day Erick Colace, NP   5,000 Units at 10/07/13 [phone]  . insulin aspart  (novoLOG) injection 0-15 Units  0-15 Units Subcutaneous TID WC Erick Colace, NP   3 Units at 10/06/13 1206  . insulin glargine (LANTUS) injection 6 Units  6 Units Subcutaneous Daily Annita Brod, MD   6 Units at 10/06/13 [phone]  . levothyroxine (SYNTHROID, LEVOTHROID) tablet 50 mcg  50 mcg Oral QAC breakfast Simbiso Ranga, MD   50 mcg at 10/06/13 0949  . metoprolol tartrate (LOPRESSOR) tablet 25 mg  25 mg Oral BID Candee Furbish, MD   25 mg at 10/06/13 2101  . mirtazapine (REMERON) tablet 15 mg  15 mg Oral QHS Simbiso Ranga, MD   15 mg at 10/06/13 2101  . multivitamin with minerals tablet 1 tablet  1 tablet Oral q morning - 10a Nat Math, MD   1 tablet at 10/06/13 0949  . ondansetron (ZOFRAN) tablet 4 mg  4 mg Oral Q6H PRN Simbiso Ranga, MD   4 mg at 10/01/13 1234   Or  . ondansetron (ZOFRAN) injection 4 mg  4 mg Intravenous Q6H PRN Simbiso Ranga, MD   4 mg at 09/28/13 1948  . saccharomyces boulardii (FLORASTOR) capsule 250 mg  250 mg Oral BID Nat Math, MD   250  mg at 10/06/13 2101  . senna-docusate (Senokot-S) tablet 1 tablet  1 tablet Oral QHS PRN Simbiso Ranga, MD      . sodium chloride 0.9 % injection 10-40 mL  10-40 mL Intracatheter PRN Debbe Odea, MD   10 mL at 10/07/13 0540  . sodium chloride 0.9 % injection 3 mL  3 mL Intravenous Q12H Simbiso Ranga, MD   3 mL at 10/05/13 2040  . tamsulosin (FLOMAX) capsule 0.4 mg  0.4 mg Oral QPC supper Simbiso Ranga, MD   0.4 mg at 10/06/13 1732  . vancomycin (VANCOCIN) 50 mg/mL oral solution 500 mg  500 mg Oral 4 times per day Annita Brod, MD   500 mg at 10/07/13 7858    Physical Exam: General appearance: somnolent, no complaints Lungs: diminished breath sounds bilaterally Heart: regular rate and rhythm Extremities: edema 1+ and venous stasis dermatitis noted  Lab Results: Results for orders placed during the hospital encounter of 09/28/13 (from the past 48 hour(s))  GLUCOSE, CAPILLARY     Status: Abnormal   Collection Time     10/05/13  7:53 AM      Result Value Ref Range   Glucose-Capillary 121 (*) 70 - 99 mg/dL  GLUCOSE, CAPILLARY     Status: Abnormal   Collection Time    10/05/13 11:41 AM      Result Value Ref Range   Glucose-Capillary 235 (*) 70 - 99 mg/dL  GLUCOSE, CAPILLARY     Status: Abnormal   Collection Time    10/05/13  2:11 PM      Result Value Ref Range   Glucose-Capillary 219 (*) 70 - 99 mg/dL   Comment 1 Documented in Chart     Comment 2 Notify RN    GLUCOSE, CAPILLARY     Status: Abnormal   Collection Time    10/05/13  4:43 PM      Result Value Ref Range   Glucose-Capillary 183 (*) 70 - 99 mg/dL   Comment 1 Documented in Chart     Comment 2 Notify RN    GLUCOSE, CAPILLARY     Status: Abnormal   Collection Time    10/05/13  9:36 PM      Result Value Ref Range   Glucose-Capillary 157 (*) 70 - 99 mg/dL  CBC     Status: Abnormal   Collection Time    10/06/13  3:48 AM      Result Value Ref Range   WBC 10.9 (*) 4.0 - 10.5 K/uL   RBC 3.59 (*) 4.22 - 5.81 MIL/uL   Hemoglobin 11.5 (*) 13.0 - 17.0 g/dL   HCT 35.8 (*) 39.0 - 52.0 %   MCV 99.7  78.0 - 100.0 fL   MCH 32.0  26.0 - 34.0 pg   MCHC 32.1  30.0 - 36.0 g/dL   RDW 15.8 (*) 11.5 - 15.5 %   Platelets 108 (*) 150 - 400 K/uL   Comment: CONSISTENT WITH PREVIOUS RESULT     REPEATED TO VERIFY     SPECIMEN CHECKED FOR CLOTS  GLUCOSE, CAPILLARY     Status: None   Collection Time    10/06/13  7:44 AM      Result Value Ref Range   Glucose-Capillary 92  70 - 99 mg/dL   Comment 1 Documented in Chart     Comment 2 Notify RN    BASIC METABOLIC PANEL     Status: Abnormal   Collection Time    10/06/13  8:10  AM      Result Value Ref Range   Sodium 142  137 - 147 mEq/L   Potassium 4.2  3.7 - 5.3 mEq/L   Chloride 104  96 - 112 mEq/L   CO2 26  19 - 32 mEq/L   Glucose, Bld 117 (*) 70 - 99 mg/dL   BUN 14  6 - 23 mg/dL   Creatinine, Ser 3.50  0.50 - 1.35 mg/dL   Calcium 8.1 (*) 8.4 - 10.5 mg/dL   GFR calc non Af Amer 69 (*) >90 mL/min    GFR calc Af Amer 80 (*) >90 mL/min   Comment: (NOTE)     The eGFR has been calculated using the CKD EPI equation.     This calculation has not been validated in all clinical situations.     eGFR's persistently <90 mL/min signify possible Chronic Kidney     Disease.  GLUCOSE, CAPILLARY     Status: Abnormal   Collection Time    10/06/13 12:00 PM      Result Value Ref Range   Glucose-Capillary 182 (*) 70 - 99 mg/dL   Comment 1 Documented in Chart     Comment 2 Notify RN    GLUCOSE, CAPILLARY     Status: None   Collection Time    10/06/13  5:20 PM      Result Value Ref Range   Glucose-Capillary 97  70 - 99 mg/dL  GLUCOSE, CAPILLARY     Status: Abnormal   Collection Time    10/06/13  9:51 PM      Result Value Ref Range   Glucose-Capillary 100 (*) 70 - 99 mg/dL  GLUCOSE, CAPILLARY     Status: None   Collection Time    10/06/13 11:10 PM      Result Value Ref Range   Glucose-Capillary 97  70 - 99 mg/dL  CBC     Status: Abnormal   Collection Time    10/07/13  5:40 AM      Result Value Ref Range   WBC 11.0 (*) 4.0 - 10.5 K/uL   RBC 3.55 (*) 4.22 - 5.81 MIL/uL   Hemoglobin 11.4 (*) 13.0 - 17.0 g/dL   HCT 82.9 (*) 16.3 - 14.1 %   MCV 98.9  78.0 - 100.0 fL   MCH 32.1  26.0 - 34.0 pg   MCHC 32.5  30.0 - 36.0 g/dL   RDW 56.4 (*) 08.1 - 68.5 %   Platelets 101 (*) 150 - 400 K/uL   Comment: SPECIMEN CHECKED FOR CLOTS     CONSISTENT WITH PREVIOUS RESULT  BASIC METABOLIC PANEL     Status: Abnormal   Collection Time    10/07/13  5:40 AM      Result Value Ref Range   Sodium 140  137 - 147 mEq/L   Potassium 4.0  3.7 - 5.3 mEq/L   Chloride 102  96 - 112 mEq/L   CO2 29  19 - 32 mEq/L   Glucose, Bld 123 (*) 70 - 99 mg/dL   BUN 11  6 - 23 mg/dL   Creatinine, Ser 4.46  0.50 - 1.35 mg/dL   Calcium 8.5  8.4 - 20.4 mg/dL   GFR calc non Af Amer 73 (*) >90 mL/min   GFR calc Af Amer 84 (*) >90 mL/min   Comment: (NOTE)     The eGFR has been calculated using the CKD EPI equation.     This  calculation has not been validated  in all clinical situations.     eGFR's persistently <90 mL/min signify possible Chronic Kidney     Disease.    Imaging: No results found.  Assessment:  1. Principal Problem: 2.   Recurrent colitis due to Clostridium difficile 3. Active Problems: 4.   CAD (coronary artery disease) 5.   Paroxysmal a-fib 6.   DM (diabetes mellitus), type 2, uncontrolled with complications 7.   Hypertension 8.   S/P CABG (coronary artery bypass graft) 9.   Hyperlipidemia 10.   Hypothyroidism 11.   Thrombocytopenia, chronic 12.   Benign prostatic hypertrophy with urinary frequency 13.   Hypotension 14.   Severe sepsis with septic shock 15.   Troponin level elevated 16.   UTI (lower urinary tract infection) 17.   Cellulitis 18.   Chronic systolic CHF (congestive heart failure) 19.   Severe aortic stenosis 20.   Positive blood culture 21.   Enteritis due to Clostridium difficile 22.   C. difficile colitis 23.   Plan:  1. Given his age and co-morbidities - I strongly agree with medical therapy. Goals of care discussion is very reasonable. I would definitely avoid digoxin. B-blocker is good treatment, especially given his cardiomyopathy and ongoing ischemia that we are medically managing. He is DNR. Could manage chest pain with nitrates if needed. He does not appear to be in significant decompensated CHF, however, would watch volume status closely as he is not on a diuretic. Consider low dose ASA 81 mg for minimal stroke/CAD prevention, provided bleeding has not been an issue.  Time Spent Directly with Patient:  15 minutes  Length of Stay:  LOS: 9 days   Pixie Casino, MD, [facility] Attending Cardiologist [place] 10/07/2013, 7:07 AM

## 2013-10-07 NOTE — Progress Notes (Signed)
Patient is set to discharge to [facility] today. Patient & son at bedside aware. Discharge packet in [place] - RN, [place] aware. [facility] scheduled for 2:00pm pickup (Service Request Id: [id number]).   Clinical Social Work Department CLINICAL SOCIAL WORK PLACEMENT NOTE 10/07/2013  Patient:  David Davidson, David Davidson  Account Number:  [id number] Admit date:  09/28/2013  Clinical Social Worker:  Unk Lightning, LCSW  Date/time:  10/03/2013 09:30 AM  Clinical Social Work is seeking post-discharge placement for this patient at the following level of care:   SKILLED NURSING   (*CSW will update this form in Epic as items are completed)   10/03/2013  Patient/family provided with [facility] Department of Clinical Social Work's list of facilities offering this level of care within the geographic area requested by the patient (or if unable, by the patient's family).  10/03/2013  Patient/family informed of their freedom to choose among providers that offer the needed level of care, that participate in Medicare, Medicaid or managed care program needed by the patient, have an available bed and are willing to accept the patient.  10/03/2013  Patient/family informed of [facility]' ownership interest in [facility], as well as of the fact that they are under no obligation to receive care at this facility.  PASARR submitted to EDS on 10/07/2013 PASARR number received from EDS on 10/07/2013  FL2 transmitted to all facilities in geographic area requested by pt/family on  10/03/2013 FL2 transmitted to all facilities within larger geographic area on   Patient informed that his/her managed care company has contracts with or will negotiate with  certain facilities, including the following:     Patient/family informed of bed offers received:  10/07/2013 Patient chooses bed at [facility] [place] Physician recommends and patient chooses bed at    [facility] [place] on  10/07/2013 Patient to be transferred to facility by PT[facility]  The following physician request were entered in Epic:   Additional Comments:   Lincoln Maxin, LCSW [facility] Clinical Social Worker cell #: [phone]

## 2013-10-07 NOTE — Progress Notes (Signed)
About 1 hour after receiving his PM meds, patient began to hallucinate. Patient was grabbing the air and said he was trying to hold onto handles that were not there. Patient had been alert and oriented at the beginning of the shift. He continued to know time, self, and somewhat situation, but he thought that he was at home. Notified triad on call who discontinued patient's ambien. Patient's bed alarm continues to be on for safety. Will continue to monitor patient.

## 2013-10-07 NOTE — Discharge Summary (Signed)
Physician Discharge Summary  David Davidson OJJ:[id number] DOB: [birth year] DOA: 09/28/2013  PCP: [name], MD  Admit date: 09/28/2013 Discharge date: 10/07/2013  Time spent: >45 minutes  Recommendations for Outpatient Follow-up:  1. Needs to f/u with ID Dr Jerolyn Center. Do not taper Vancomycin.  2. [facility] 3. Needs to start ACE I for chronic systolic CHF once BP improves 4. Needs daily weights and close follow up for fluid overload due to severe systolic CHF  Discharge Diagnoses:  Principal Problem:   Recurrent colitis due to Clostridium difficile--Severe sepsis with septic shock Active Problems:  Cellulitis left leg  Positive blood culture   CAD (coronary artery disease)   Paroxysmal a-fib   DM (diabetes mellitus), type 2, uncontrolled with complications   Hypertension   S/P CABG (coronary artery bypass graft)   Hyperlipidemia   Hypothyroidism   Thrombocytopenia, chronic   Benign prostatic hypertrophy with urinary frequency   Hypotension   Troponin level elevated   Chronic systolic CHF (congestive heart failure)   Severe aortic stenosis     Discharge Condition: stable  Diet recommendation: heart healthy  Filed Weights   10/05/13 1400 10/06/13 1244 10/07/13 0404  Weight: 95.5 kg (210 lb 8.6 oz) 93.4 kg (205 lb 14.6 oz) 93.5 kg (206 lb 2.1 oz)    History of present illness:  David Davidson is a 80 y.o. male presenting on 09/28/2013 past medical history of CAD, diabetes, CK-MB and recent C. difficile-completed oral vancomycin course on 4/16 who had an extended stay in December of 2014 for congestive heart failure and a non-ST elevated MI, medically managed. He presented to the emergency room on 4/21 with generalized weakness, fever and frequent falls. He was found to be hypotensive with a mild leukocytosis and cellulitis of the left lower extremity, yeast in urine and diarrhea which became positive for C. difficile again. His troponins were also mildly  elevated. Patient was admitted for septic shock  Patient was placed in the step down unit and started on by mouth vancomycin/IV Flagyl for C. difficile, Diflucan for candiduria. Patient's blood cultures preliminary growing out gram-positive cocci in clusters-presumably strep, most likely source is his cellulitis and he is on vancomycin/Zosyn for this. Despite aggressive fluid boluses on 4/20, patient remained hypotensive and critical care was consulted for central line placement and started on pressor support. Patient tolerated this well and pressors have been weaned off on 4/20 one more week.  Lastly, the patient's troponins trended upward. This was discussed with cardiology. Patient was felt to be having a non-STEMI secondary to demand ischemia brought on by septic shock and secondary hypotension. Cardiology recommendation was for treating underlying issue. Heparin has been started as per pharmacy. Patient overall clinically improved on 4/21 and more stable.  On 4/26 patient developed A-fib with RVR which he has a history of and has not been on anticoagulation or rate controlling agents for.   Hospital Course:  Severe sepsis with septic shock  (a) Recurrent colitis due to Clostridium difficile  - C. difficile cultures positive. Given his severe illness was placed on by mouth vancomycin plus IV Flagyl. ID consulted and recommended to stop IV Flagyl - - tolerating orals well and now having normal solid stool - ID is planning on a second fecal transplant for him- his Vancomycin should be continued at the current high dose.   (b)Cellulitis  - left leg- appears to have resolved  (c) bacteremia  - 1/2 sets of positive blood cultures- for coag neg  staph and enterococcus gallinarium  - started Ampicllin- TTE negative for endocarditis- Per ID, Ampicillin waas given but as ECHO was negative, it was stopped on 4/25  - appreciate ID assistance   Hypotension with a h/o HTN  - Secondary septic shock  -  Treated with aggressive fluid boluses and then pressor support-  - BP now controlled with B Blocker (using this for rate control)  Non-ST elevated MI- likely secondary to septic shock  [name] discussed with cardiology and recommendations were to Cycle troponins, heparin drip  - T max 1.26  - Troponin bumped slightly but subsequently improved - Troponins cont to trend down- have d/c'd Heparin   Paroxysmal a-fib  - not on rate controlling agent at home  - does not appear to be on chronic anticoagulation- at this time he is a fall risk and therefore, will avoid anticoagulants other than a baby ASA.  -cardiology has assisted with management and placed him on Lopressor BID which he will be discharged with.   Mildly severe Ao Stenosis  - follow- not a surgical candidate - can follow with [facility] for any issues  Chronic systolic CHF with EF of 25/30% per ECHO yesterday  - compensated  - noted that patient does not take and ACE I at home  - as he continues to be hypotensive at this time, will need to start ACE I after BP improves   DM (diabetes mellitus), type 2  -  resumed Glipizide   S/P CABG   Hyperlipidemia  - Stable   Hypothyroidism  - Continue Synthroid   Thrombocytopenia  - chronic       Procedures: Central line- 4/20   Consultations:  ID  Discharge Exam: Filed Vitals:   10/06/13 1531 10/06/13 2101 10/07/13 0404 10/07/13 0926  BP: 115/49 124/56 126/67   Pulse: 70 61 65 68  Temp: 97.7 F (36.5 C) 97.9 F (36.6 C) 97.7 F (36.5 C)   TempSrc: Oral Oral Oral   Resp: 22 22 19    Height:      Weight:   93.5 kg (206 lb 2.1 oz)   SpO2: 93% 98% 98%      General: No acute respiratory distress- slightly confused due to Ambien given last night.   Lungs: Clear to auscultation bilaterally without wheezes or crackles  Cardiovascular: Irregularly irregular rate and rhythm without murmur gallop or rub normal S1 and S2  Abdomen: Nontender, nondistended,  soft, bowel sounds positive, no rebound, no ascites, no appreciable mass  Extremities: No significant cyanosis, clubbing, + mild edema Right leg   Discharge Instructions You were cared for by a hospitalist during your hospital stay. If you have any questions about your discharge medications or the care you received while you were in the hospital after you are discharged, you can call the unit and asked to speak with the hospitalist on call if the hospitalist that took care of you is not available. Once you are discharged, your primary care physician will handle any further medical issues. Please note that NO REFILLS for any discharge medications will be authorized once you are discharged, as it is imperative that you return to your primary care physician (or establish a relationship with a primary care physician if you do not have one) for your aftercare needs so that they can reassess your need for medications and monitor your lab values.     Medication List    ASK your doctor about these medications  atorvastatin 10 MG tablet  Commonly known as:  LIPITOR  Take 10 mg by mouth at bedtime.     finasteride 5 MG tablet  Commonly known as:  PROSCAR  Take 5 mg by mouth at bedtime.     glipiZIDE 10 MG 24 hr tablet  Commonly known as:  GLUCOTROL XL  Take 10 mg by mouth daily with breakfast.     GLUCERNA Liqd  Take 237 mLs by mouth daily.     HYDROcodone-acetaminophen 5-325 MG per tablet  Commonly known as:  NORCO/VICODIN  Take 1 tablet by mouth every 6 (six) hours as needed for moderate pain.     levothyroxine 50 MCG tablet  Commonly known as:  SYNTHROID, LEVOTHROID  Take 50 mcg by mouth daily before breakfast.     mirtazapine 15 MG tablet  Commonly known as:  REMERON  Take 15 mg by mouth at bedtime.     multivitamin with minerals Tabs tablet  Take 1 tablet by mouth every morning.     potassium chloride 10 MEQ tablet  Commonly known as:  K-DUR,KLOR-CON  Take 10 mEq by mouth  every morning.     tamsulosin 0.4 MG Caps capsule  Commonly known as:  FLOMAX  Take 0.4 mg by mouth daily after supper.     vancomycin 125 MG capsule  Commonly known as:  VANCOCIN  Take 125 mg by mouth daily. For 7 days started on 09-18-13; stop on 09-24-13     vancomycin 50 mg/mL oral solution  Commonly known as:  VANCOCIN  Take 2.5 mLs (125 mg total) by mouth 3 (three) times daily. Reduce to bid, then qd then off weekly     zolpidem 10 MG tablet  Commonly known as:  AMBIEN  Take 10 mg by mouth at bedtime.       Allergies  Allergen Reactions  . Colestipol Other (See Comments)    "Blacked Out"   . Morphine And Related Other (See Comments)    Reaction: hallucinations      The results of significant diagnostics from this hospitalization (including imaging, microbiology, ancillary and laboratory) are listed below for reference.    Significant Diagnostic Studies: Dg Chest Port 1 View  10/02/2013   CLINICAL DATA:  Evaluate atelectasis  EXAM: PORTABLE CHEST - 1 VIEW  COMPARISON:  DG CHEST 1V PORT dated 09/29/2013; DG CHEST 1V PORT dated 06/16/2013; DG CHEST 1V PORT dated 06/14/2013; DG CHEST 2 VIEW dated 05/20/2013  FINDINGS: Grossly unchanged enlarged cardiac silhouette and mediastinal contours post median sternotomy and CABG. Stable positioning of support apparatus. The pulmonary vasculature appears less distinct on present examination with cephalization of flow. Minimal increase in size of trace bilateral effusions and associated worsening bibasilar opacities, left greater than right. No pneumothorax. Unchanged bones.  IMPRESSION: Worsening pulmonary edema and bibasilar atelectasis.   Electronically Signed   By: Simonne Come M.D.   On: 10/02/2013 07:34   Dg Chest Port 1 View  09/29/2013   CLINICAL DATA:  Central line placement, history diabetes, hypertension, coronary artery disease post MI, CHF  EXAM: PORTABLE CHEST - 1 VIEW  COMPARISON:  Portable exam 1630 hr compared to 09/28/2013   FINDINGS: Right jugular central venous catheter tip projects over SVC near cavoatrial junction.  Enlargement of cardiac silhouette post CABG.  Pulmonary vascular congestion.  Bibasilar atelectasis.  No acute infiltrate, pleural effusion or pneumothorax.  Bones appear demineralized.  IMPRESSION: No pneumothorax following right jugular line placement.  Enlargement of cardiac silhouette with pulmonary vascular  congestion post CABG.  Mild bibasilar atelectasis.   Electronically Signed   By: Ulyses Southward M.D.   On: 09/29/2013 16:50   Dg Chest Port 1 View  09/28/2013   CLINICAL DATA:  Hypotension. Weakness. Congestive heart failure. Pneumonia.  EXAM: PORTABLE CHEST - 1 VIEW  COMPARISON:  09/05/2013  FINDINGS: Cardiomegaly stable. Prior CABG again noted. Pulmonary interstitial prominence remains stable. No evidence of acute infiltrate or edema. No evidence of pleural effusion.  IMPRESSION: Stable cardiomegaly.  No active lung disease.   Electronically Signed   By: Myles Rosenthal M.D.   On: 09/28/2013 14:40    Microbiology: Recent Results (from the past 240 hour(s))  CULTURE, BLOOD (ROUTINE X 2)     Status: None   Collection Time    09/28/13 11:42 AM      Result Value Ref Range Status   Specimen Description BLOOD LEFT ARM  5 ML IN [facility] BOTTLE   Final   Special Requests NONE   Final   Culture  Setup Time     Final   Value: 09/28/2013 21:24     Performed at [facility]   Culture     Final   Value: ENTEROCOCCUS GALLINARUM     STAPHYLOCOCCUS SPECIES (COAGULASE NEGATIVE)     Note: THE SIGNIFICANCE OF ISOLATING THIS ORGANISM FROM A SINGLE SET OF BLOOD CULTURES WHEN MULTIPLE SETS ARE DRAWN IS UNCERTAIN. PLEASE NOTIFY THE MICROBIOLOGY DEPARTMENT WITHIN ONE WEEK IF SPECIATION AND SENSITIVITIES ARE REQUIRED.     Note: Gram Stain Report Called to,Read Back By and Verified With: GAIL S@11 :51AM ON 09/29/13 BY DANTS     Performed at [facility]   Report Status 10/02/2013 FINAL   Final   Organism ID,  Bacteria ENTEROCOCCUS GALLINARUM   Final  URINE CULTURE     Status: None   Collection Time    09/28/13 11:59 AM      Result Value Ref Range Status   Specimen Description URINE, RANDOM   Final   Special Requests Immunocompromised   Final   Culture  Setup Time     Final   Value: 09/28/2013 19:47     Performed at [facility] Count     Final   Value: 15,000 COLONIES/ML     Performed at [facility]   Culture     Final   Value: Multiple bacterial morphotypes present, none predominant. Suggest appropriate recollection if clinically indicated.     Performed at [facility]   Report Status 09/29/2013 FINAL   Final  CLOSTRIDIUM DIFFICILE BY PCR     Status: Abnormal   Collection Time    09/28/13  4:59 PM      Result Value Ref Range Status   C difficile by pcr POSITIVE (*) NEGATIVE Final   Comment: CRITICAL RESULT CALLED TO, READ BACK BY AND VERIFIED WITH:     MAIN RN 9:40 09/29/13 ([email or web address])     Performed at [facility]  MRSA PCR SCREENING     Status: Abnormal   Collection Time    09/28/13  4:59 PM      Result Value Ref Range Status   MRSA by PCR POSITIVE (*) NEGATIVE Final   Comment:            The GeneXpert MRSA Assay (FDA     approved for NASAL specimens     only), is one component of a     comprehensive MRSA colonization  surveillance program. It is not     intended to diagnose MRSA     infection nor to guide or     monitor treatment for     MRSA infections.     RESULT CALLED TO, READ BACK BY AND VERIFIED WITH:     S.DILLON RN AT 1859 ON [id number] BY C.BONGEL  CULTURE, BLOOD (ROUTINE X 2)     Status: None   Collection Time    09/28/13  5:07 PM      Result Value Ref Range Status   Specimen Description BLOOD RIGHT ARM   Final   Special Requests BOTTLES DRAWN AEROBIC AND ANAEROBIC 9CC   Final   Culture  Setup Time     Final   Value: 09/28/2013 21:24     Performed at [facility]   Culture     Final   Value: NO GROWTH 5 DAYS      Performed at [facility]   Report Status 10/04/2013 FINAL   Final     Labs: Basic Metabolic Panel:  Recent Labs Lab 10/02/13 0325 10/03/13 0330 10/04/13 0435 10/06/13 0810 10/07/13 0540  NA 142 143 146 142 140  K 3.9 4.3 4.4 4.2 4.0  CL 111 109 111 104 102  CO2 20 25 26 26 29   GLUCOSE 181* 121* 236* 117* 123*  BUN 32* 24* 21 14 11   CREATININE 1.16 0.98 0.82 0.85 0.75  CALCIUM 7.9* 8.1* 8.2* 8.1* 8.5   Liver Function Tests:  Recent Labs Lab 10/01/13 0320  AST 19  ALT 15  ALKPHOS 52  BILITOT <0.2*  PROT 5.6*  ALBUMIN 2.4*   No results found for this basename: LIPASE, AMYLASE,  in the last 168 hours No results found for this basename: AMMONIA,  in the last 168 hours CBC:  Recent Labs Lab 10/03/13 0330 10/04/13 0435 10/05/13 0317 10/06/13 0348 10/07/13 0540  WBC 7.7 7.4 8.3 10.9* 11.0*  HGB 11.3* 11.4* 11.1* 11.5* 11.4*  HCT 34.0* 36.1* 35.1* 35.8* 35.1*  MCV 97.4 100.6* 99.4 99.7 98.9  PLT 105* 102* 112* 108* 101*   Cardiac Enzymes:  Recent Labs Lab 10/01/13 0320  TROPONINI 0.92*   BNP: BNP (last 3 results)  Recent Labs  05/16/13 1403 05/20/13 0606 06/14/13 1150  PROBNP 2658.0* 1575.0* 879.1*   CBG:  Recent Labs Lab 10/06/13 1200 10/06/13 1720 10/06/13 2151 10/06/13 2310 10/07/13 0753  GLUCAP 182* 97 100* 97 125*       Signed:  Evadna Donaghy  [facility] 10/07/2013, 10:27 AM

## 2013-10-27 ENCOUNTER — Ambulatory Visit: Payer: Medicare Other | Admitting: Internal Medicine

## 2013-11-10 DEATH — deceased

## 2014-12-06 IMAGING — CR DG CHEST 2V
2 series · 2 of 2 positions shown · non-contrast
Comparison: 12/24/2012

CLINICAL DATA: Shortness of breath

EXAM:
CHEST  2 VIEW

[w chest pa]
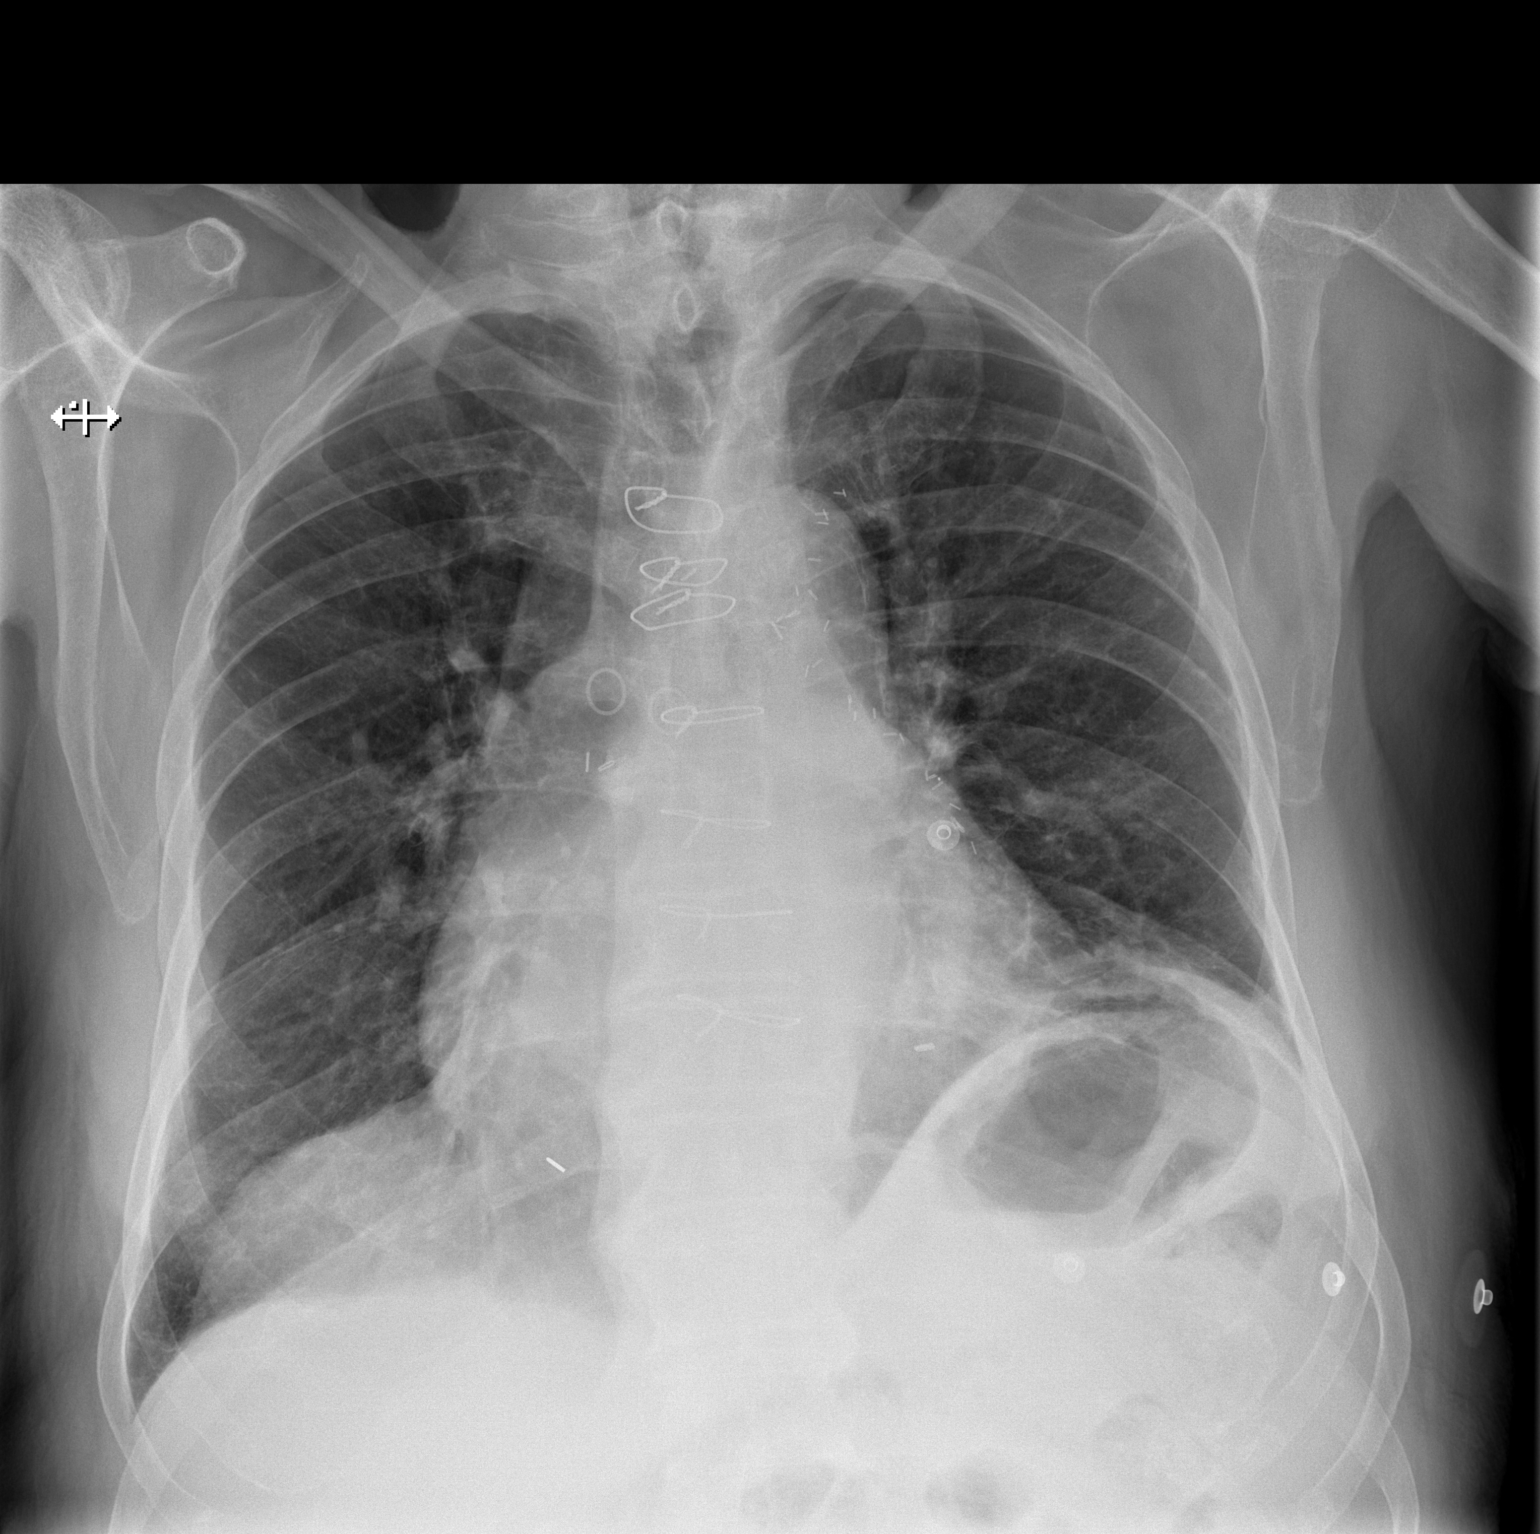

[w chest lat]
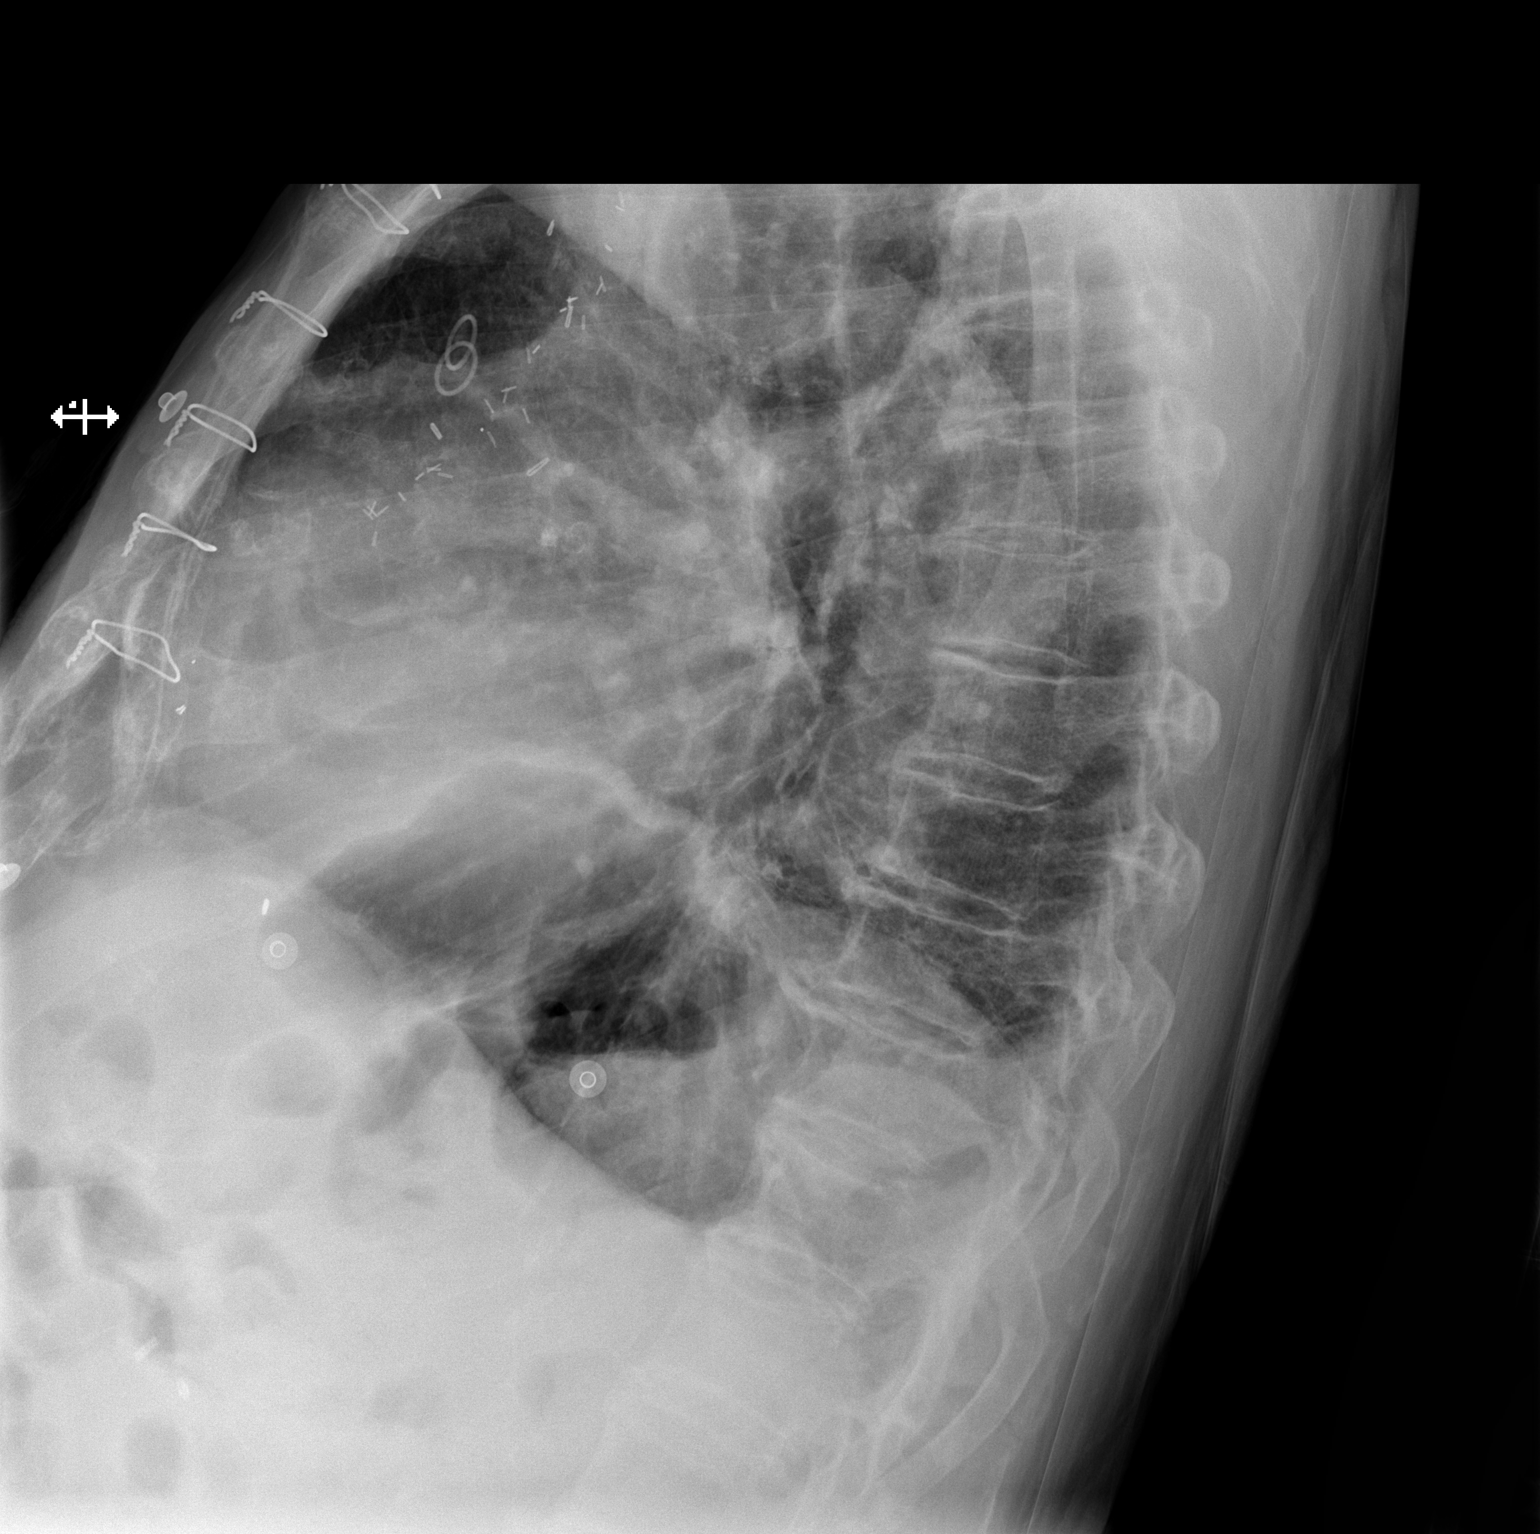

[2 of 2 positions shown; findings below may reference images not displayed]

FINDINGS: Cardiac shadow remains enlarged. Postsurgical changes are again
noted. The left hemidiaphragm is again elevated. No focal infiltrate
or sizable effusion is seen.
IMPRESSION: Chronic changes without acute abnormality.

## 2015-06-19 IMAGING — DX DG CHEST 1V PORT
1 series · 1 of 1 positions shown · non-contrast
Comparison: Portable exam 8209 hr compared to 09/28/2013

CLINICAL DATA: Central line placement, history diabetes,
hypertension, coronary artery disease post MI, CHF

EXAM:
PORTABLE CHEST - 1 VIEW

[AP]
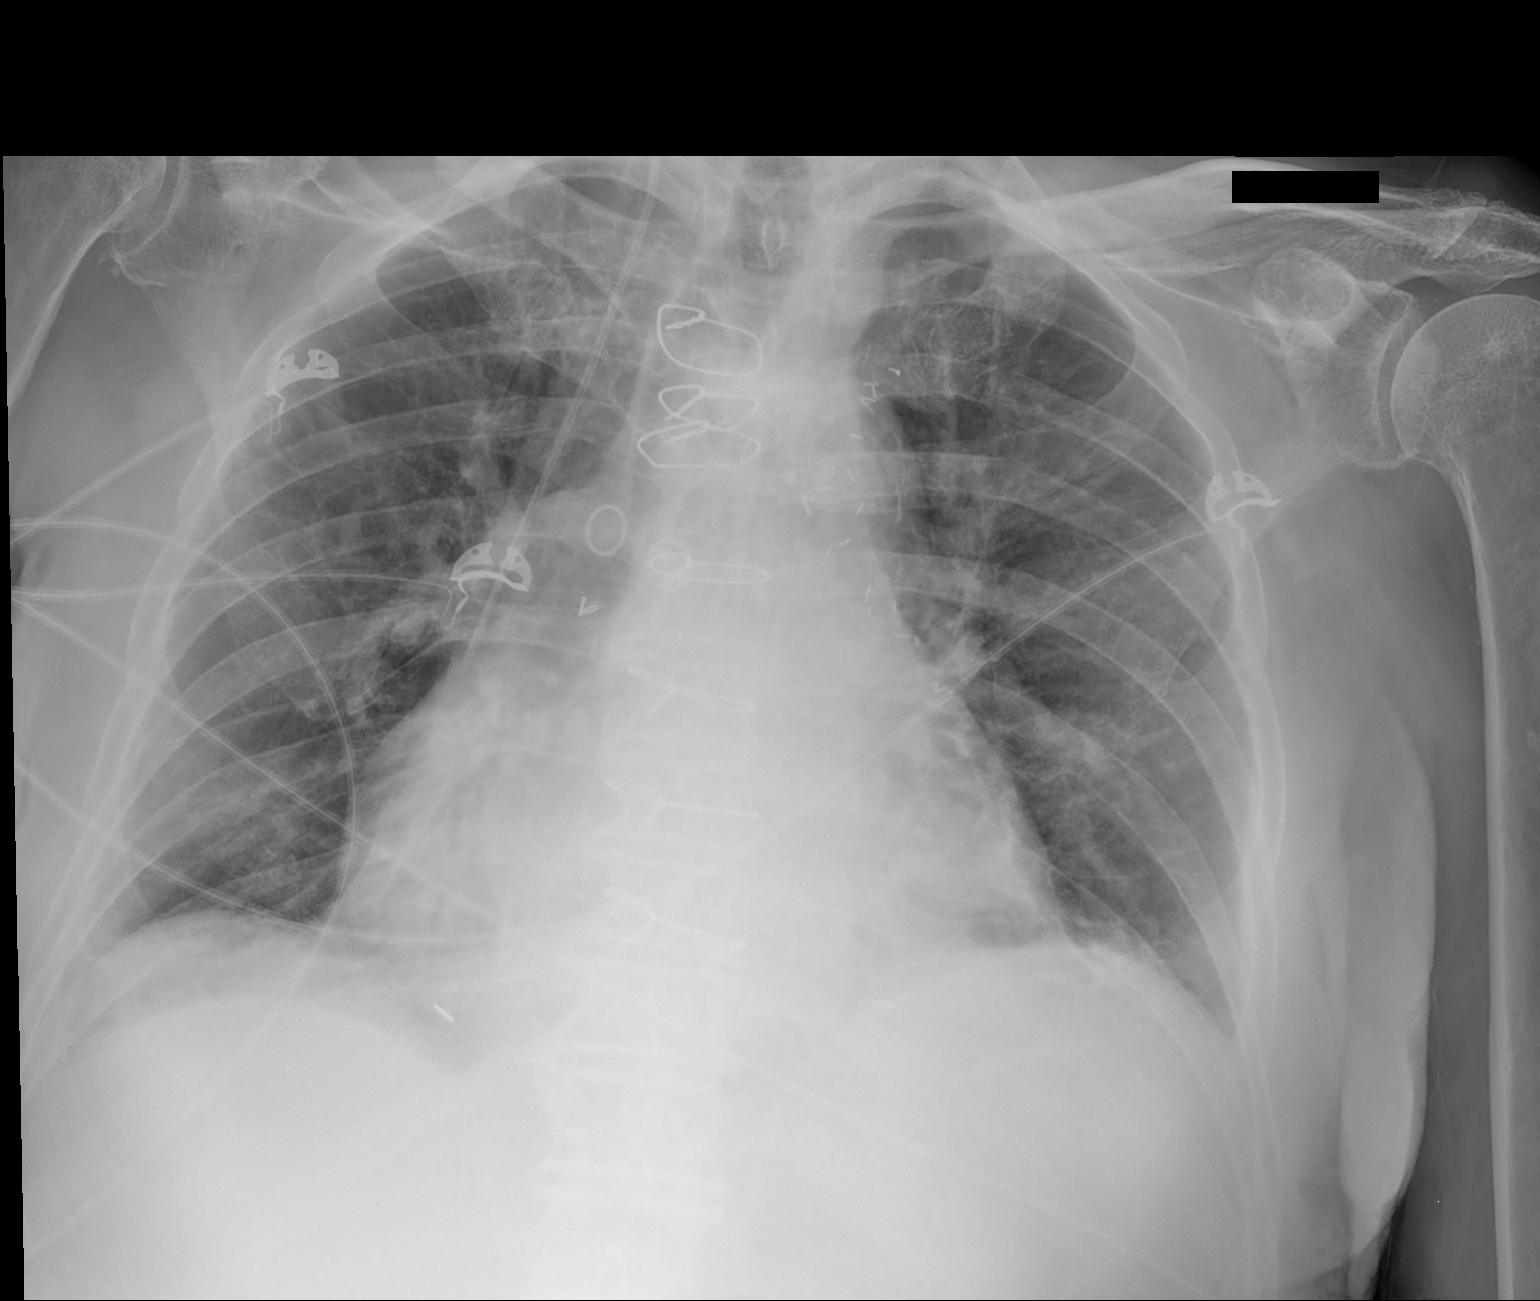

[1 of 1 positions shown; findings below may reference images not displayed]

FINDINGS: Right jugular central venous catheter tip projects over SVC near
cavoatrial junction.

Enlargement of cardiac silhouette post CABG.

Pulmonary vascular congestion.

Bibasilar atelectasis.

No acute infiltrate, pleural effusion or pneumothorax.

Bones appear demineralized.
IMPRESSION: No pneumothorax following right jugular line placement.

Enlargement of cardiac silhouette with pulmonary vascular congestion
post CABG.

Mild bibasilar atelectasis.
